# Patient Record
Sex: Male | Born: 1950 | Race: White | Hispanic: No | Marital: Married | Smoking: Current every day smoker
Health system: Southern US, Community
[De-identification: ages and names within clinical notes are randomized; demographics above are authoritative.]

## PROBLEM LIST (undated history)

## (undated) DIAGNOSIS — Z8619 Personal history of other infectious and parasitic diseases: Secondary | ICD-10-CM

## (undated) DIAGNOSIS — Z5189 Encounter for other specified aftercare: Secondary | ICD-10-CM

## (undated) DIAGNOSIS — F32A Depression, unspecified: Secondary | ICD-10-CM

## (undated) DIAGNOSIS — F419 Anxiety disorder, unspecified: Secondary | ICD-10-CM

## (undated) DIAGNOSIS — N189 Chronic kidney disease, unspecified: Secondary | ICD-10-CM

## (undated) HISTORY — DX: Encounter for other specified aftercare: Z51.89

## (undated) HISTORY — DX: Personal history of other infectious and parasitic diseases: Z86.19

## (undated) HISTORY — DX: Depression, unspecified: F32.A

## (undated) HISTORY — DX: Chronic kidney disease, unspecified: N18.9

## (undated) HISTORY — PX: NO PAST SURGERIES: SHX2092

## (undated) HISTORY — DX: Anxiety disorder, unspecified: F41.9

---

## 1997-11-30 ENCOUNTER — Emergency Department (HOSPITAL_COMMUNITY): Admission: EM | Admit: 1997-11-30 | Discharge: 1997-11-30 | Payer: Self-pay | Admitting: Emergency Medicine

## 1998-03-26 ENCOUNTER — Emergency Department (HOSPITAL_COMMUNITY): Admission: EM | Admit: 1998-03-26 | Discharge: 1998-03-26 | Payer: Self-pay | Admitting: Emergency Medicine

## 2010-04-14 DIAGNOSIS — Z8619 Personal history of other infectious and parasitic diseases: Secondary | ICD-10-CM

## 2010-04-14 HISTORY — DX: Personal history of other infectious and parasitic diseases: Z86.19

## 2011-07-30 ENCOUNTER — Encounter (HOSPITAL_BASED_OUTPATIENT_CLINIC_OR_DEPARTMENT_OTHER): Payer: Self-pay | Admitting: *Deleted

## 2011-07-30 ENCOUNTER — Emergency Department (INDEPENDENT_AMBULATORY_CARE_PROVIDER_SITE_OTHER): Payer: BC Managed Care – PPO

## 2011-07-30 ENCOUNTER — Emergency Department (HOSPITAL_BASED_OUTPATIENT_CLINIC_OR_DEPARTMENT_OTHER)
Admission: EM | Admit: 2011-07-30 | Discharge: 2011-07-30 | Disposition: A | Discharge status: Home / Self Care | Payer: BC Managed Care – PPO | Attending: Emergency Medicine | Admitting: Emergency Medicine

## 2011-07-30 DIAGNOSIS — M79609 Pain in unspecified limb: Secondary | ICD-10-CM | POA: Insufficient documentation

## 2011-07-30 DIAGNOSIS — S92309A Fracture of unspecified metatarsal bone(s), unspecified foot, initial encounter for closed fracture: Secondary | ICD-10-CM

## 2011-07-30 DIAGNOSIS — IMO0002 Reserved for concepts with insufficient information to code with codable children: Secondary | ICD-10-CM | POA: Insufficient documentation

## 2011-07-30 DIAGNOSIS — M7989 Other specified soft tissue disorders: Secondary | ICD-10-CM | POA: Insufficient documentation

## 2011-07-30 DIAGNOSIS — F17200 Nicotine dependence, unspecified, uncomplicated: Secondary | ICD-10-CM | POA: Insufficient documentation

## 2011-07-30 NOTE — Discharge Instructions (Signed)
Foot Fracture Your caregiver has diagnosed you as having a foot fracture (broken bone). Your foot has many bones. You have a fracture, or break, in one of these bones. In some cases, your doctor may put on a splint or removable fracture boot until the swelling in your foot has lessened. A cast may or may not be required. HOME CARE INSTRUCTIONS  If you do not have a cast or splint:  You may bear weight on your injured foot as tolerated or advised.   Do not put any weight on your injured foot for as long as directed by your caregiver. Slowly increase the amount of time you walk on the foot as the pain and swelling allows or as advised.   Use crutches until you can bear weight without pain. A gradual increase in weight bearing may help.   Apply ice to the injury for 15 to 20 minutes each hour while awake for the first 2 days. Put the ice in a plastic bag and place a towel between the bag of ice and your skin.   If an ace bandage (stretchy, elastic wrapping bandage) was applied, you may re-wrap it if ankle is more painful or your toes become cold and swollen.  If you have a cast or splint:  Use your crutches for as long as directed by your caregiver.   To lessen the swelling, keep the injured foot elevated on pillows while lying down or sitting. Elevate your foot above your heart.   Apply ice to the injury for 15 to 20 minutes each hour while awake for the first 2 days. Put the ice in a plastic bag and place a thin towel between the bag of ice and your cast.   Plaster or fiberglass cast:   Do not try to scratch the skin under the cast using a sharp or pointed object down the cast.   Check the skin around the cast every day. You may put lotion on any red or sore areas.   Keep your cast clean and dry.   Plaster splint:   Wear the splint until you are seen for a follow-up examination.   You may loosen the elastic around the splint if your toes become numb, tingle, or turn blue or cold. Do  not rest it on anything harder than a pillow in the first 24 hours.   Do not put pressure on any part of your splint. Use your crutches as directed.   Keep your splint dry. It can be protected during bathing with a plastic bag. Do not lower the splint into water.   If you have a fracture boot you may remove it to shower. Bear weight only as instructed by your caregiver.   Only take over-the-counter or prescription medicines for pain, discomfort, or fever as directed by your caregiver.  SEEK IMMEDIATE MEDICAL CARE IF:   Your cast gets damaged or breaks.   You have continued severe pain or more swelling than you did before the cast was put on.   Your skin or nails of your casted foot turn blue, gray, feel cold or numb.   There is a bad smell from your cast.   There is severe pain with movement of your toes.   There are new stains and/or drainage coming from under the cast.  MAKE SURE YOU:   Understand these instructions.   Will watch your condition.   Will get help right away if you are not doing well or get   worse.  Document Released: 03/28/2000 Document Revised: 03/20/2011 Document Reviewed: 05/04/2008 Exit[employer] Patient Information 2012 [employer]. 

## 2011-07-30 NOTE — ED Notes (Signed)
Patient states that he was catching "fast balls" with his son and the ball came down and hit his foot a week ago. Patient has been applying ice and taking ibuprofen with little relief. Pt states that the ice gets the swelling down but when he walks on it, the swelling increase. patient states that it hurts when he walks on it. Elevates foot at night.

## 2011-07-30 NOTE — ED Provider Notes (Signed)
History     CSN: [id number]  Arrival date & time 07/30/11  1302   None     Chief Complaint  Patient presents with  . Foot Injury    (Consider location/radiation/quality/duration/timing/severity/associated sxs/prior treatment) Patient is a 61 y.o. male presenting with foot injury. The history is provided by the patient. No language interpreter was used.  Foot Injury  Incident onset: 1.5. The incident occurred at home. The injury mechanism was a direct blow. The pain is present in the right foot. The quality of the pain is described as aching. The pain is at a severity of 6/10. The pain is moderate. Pertinent negatives include no numbness and no inability to bear weight. He reports no foreign bodies present. The symptoms are aggravated by nothing. He has tried nothing for the symptoms. The treatment provided no relief.    History reviewed. No pertinent past medical history.  History reviewed. No pertinent past surgical history.  History reviewed. No pertinent family history.  History  Substance Use Topics  . Smoking status: Current Everyday Smoker -- 2.0 packs/day  . Smokeless tobacco: Not on file  . Alcohol Use: No      Review of Systems  Musculoskeletal: Positive for myalgias and joint swelling.  Neurological: Negative for numbness.  All other systems reviewed and are negative.    Allergies  Acyclovir and related  Home Medications  No current outpatient prescriptions on file.  BP 146/71  Pulse 100  Temp(Src) 98.1 F (36.7 C) (Oral)  Resp 16  Ht 6\' 1"  (1.854 m)  Wt 160 lb (72.576 kg)  BMI 21.11 kg/m2  SpO2 100%  Physical Exam  Constitutional: He is oriented to person, place, and time. He appears well-developed and well-nourished.  Musculoskeletal: He exhibits edema and tenderness.       Tender right foot,  Bruising to base of toes  Neurological: He is alert and oriented to person, place, and time.  Skin: Skin is warm and dry.  Psychiatric: He has a  normal mood and affect.    ED Course  Procedures (including critical care time)  Labs Reviewed - No data to display Dg Foot Complete Right  07/30/2011  *RADIOLOGY REPORT*  Clinical Data: Blow to the dorsal aspect of the foot 1 week ago. Pain and swelling.  RIGHT FOOT COMPLETE - 3+ VIEW  Comparison: None.  Findings: A nondisplaced fracture is identified on the lateral view through the base of a metatarsal.  The fracture is not well seen on the AP and oblique views but appears to involve the second metatarsal. The fracture does not appear to extend to the articular surface.  No other acute bony or joint abnormality is identified.  IMPRESSION: Nondisplaced fracture through the proximal metaphysis of a metatarsal, likely the second.  Original Report Authenticated By: Bernadene Bell. Maricela Curet, M.D.     No diagnosis found.    MDM  Pt placed in ace and post op shoe.  Pt advised to follow up with Dr. Pearletha Forge in 1 week.  Pt declined crutches or pain medication        Lonia Skinner [place], [place] 07/30/11 [phone]

## 2011-07-30 NOTE — ED Provider Notes (Signed)
Medical screening examination/treatment/procedure(s) were performed by non-physician practitioner and as supervising physician I was immediately available for consultation/collaboration.  Shelda Jakes, MD 07/30/11 [phone]

## 2011-07-30 NOTE — ED Notes (Signed)
Pt c/o right foot pain x 1 week ago hit by baseball

## 2011-08-15 ENCOUNTER — Encounter: Payer: Self-pay | Admitting: Family Medicine

## 2011-08-15 ENCOUNTER — Ambulatory Visit (HOSPITAL_BASED_OUTPATIENT_CLINIC_OR_DEPARTMENT_OTHER)
Admission: RE | Admit: 2011-08-15 | Discharge: 2011-08-15 | Disposition: A | Discharge status: Home / Self Care | Payer: BC Managed Care – PPO | Source: Ambulatory Visit | Attending: Family Medicine | Admitting: Family Medicine

## 2011-08-15 ENCOUNTER — Ambulatory Visit (INDEPENDENT_AMBULATORY_CARE_PROVIDER_SITE_OTHER): Payer: BC Managed Care – PPO | Admitting: Family Medicine

## 2011-08-15 VITALS — BP 151/79 | HR 98 | Temp 98.0°F | Ht 74.0 in | Wt 165.0 lb

## 2011-08-15 DIAGNOSIS — S8990XA Unspecified injury of unspecified lower leg, initial encounter: Secondary | ICD-10-CM

## 2011-08-15 DIAGNOSIS — S99922A Unspecified injury of left foot, initial encounter: Secondary | ICD-10-CM | POA: Insufficient documentation

## 2011-08-15 DIAGNOSIS — S99921A Unspecified injury of right foot, initial encounter: Secondary | ICD-10-CM

## 2011-08-15 DIAGNOSIS — M25511 Pain in right shoulder: Secondary | ICD-10-CM | POA: Insufficient documentation

## 2011-08-15 DIAGNOSIS — Z09 Encounter for follow-up examination after completed treatment for conditions other than malignant neoplasm: Secondary | ICD-10-CM

## 2011-08-15 DIAGNOSIS — S92309A Fracture of unspecified metatarsal bone(s), unspecified foot, initial encounter for closed fracture: Secondary | ICD-10-CM | POA: Insufficient documentation

## 2011-08-15 DIAGNOSIS — X58XXXA Exposure to other specified factors, initial encounter: Secondary | ICD-10-CM | POA: Insufficient documentation

## 2011-08-15 DIAGNOSIS — M25519 Pain in unspecified shoulder: Secondary | ICD-10-CM

## 2011-08-15 DIAGNOSIS — S99929A Unspecified injury of unspecified foot, initial encounter: Secondary | ICD-10-CM

## 2011-08-15 DIAGNOSIS — S99919A Unspecified injury of unspecified ankle, initial encounter: Secondary | ICD-10-CM | POA: Insufficient documentation

## 2011-08-15 NOTE — Assessment & Plan Note (Signed)
metatarsal base fracture.  Difficult to discern whether this is 2nd or 3rd MT by radiographs - by exam his most pain is 3rd MT.  Regardless this should heal well with conservative care.  Repeat x-rays today show callus formation.  Only 4 weeks out from injury - discussed should be another 2-4 weeks before he feels back to normal though swelling can take longer.  Can use postop shoe as needed (feels comfortable in this and a regular shoe).  Icing, tylenol, nsaids as needed.  F/u prn.

## 2011-08-15 NOTE — Assessment & Plan Note (Signed)
2/2 rotator cuff impingement, less so AC DJD.  Home exercise program demonstrated with theraband.

## 2011-08-15 NOTE — Progress Notes (Signed)
  Subjective:    Patient ID: Ian Hart, male    DOB: 04-14-1951, 61 y.o.   MRN: [id number]  PCP: None  HPI 61 yo M here for right foot injury.  Patient reports on 4/7 he was catching while his son was throwing pitches. One of these broke to the left and struck him directly on dorsal aspect of right foot. Limping after this. + swelling (and still does at times). He eventually went to ED and had x-rays showing a metatarsal fracture (either 2nd or 3rd MT - seen on lateral). Placed in postop shoe which he used for 1 week. Has been taking some ibuprofen and aspirin, using ace wrap also. No prior right foot injuries.  At end of visit also asked about some lateral upper arm/shoulder pain with pitching/overhead motions.  History reviewed. No pertinent past medical history.  No current outpatient prescriptions on file prior to visit.    History reviewed. No pertinent past surgical history.  Allergies  Allergen Reactions  . Acyclovir And Related     History   Social History  . Marital Status: Married    Spouse Name: N/A    Number of Children: N/A  . Years of Education: N/A   Occupational History  . Not on file.   Social History Main Topics  . Smoking status: Current Everyday Smoker -- 2.0 packs/day  . Smokeless tobacco: Not on file  . Alcohol Use: No  . Drug Use: No  . Sexually Active: No   Other Topics Concern  . Not on file   Social History Narrative  . No narrative on file    Family History  Problem Relation Age of Onset  . Sudden death Mother   . Hypertension Neg Hx   . Hyperlipidemia Neg Hx   . Heart attack Neg Hx   . Diabetes Neg Hx     BP 151/79  Pulse 98  Temp(Src) 98 F (36.7 C) (Oral)  Ht 6\' 2"  (1.88 m)  Wt 165 lb (74.844 kg)  BMI 21.18 kg/m2  Review of Systems See HPI above.    Objective:   Physical Exam Gen: NAD  L foot: Mild swelling dorsally.  No bruising or other deformity. FROM ankle without pain. TTP greatest at base of  3rd MT, less at 2nd MT. No other TTP about foot or ankle. Negative ant drawer and talar tilt.   Negative syndesmotic compression. Thompsons test negative. NV intact distally.  R shoulder: No swelling, ecchymoses.  No gross deformity. Minimal TTP AC joint - more TTP just under acromion laterally. FROM. Negative Hawkins, Neers. Strength 5/5 with mild + empty can, no pain with resisted internal/external rotation. Negative apprehension. NV intact distally.    Assessment & Plan:  1. Left foot injury - metatarsal base fracture.  Difficult to discern whether this is 2nd or 3rd MT by radiographs - by exam his most pain is 3rd MT.  Regardless this should heal well with conservative care.  Repeat x-rays today show callus formation.  Only 4 weeks out from injury - discussed should be another 2-4 weeks before he feels back to normal though swelling can take longer.  Can use postop shoe as needed (feels comfortable in this and a regular shoe).  Icing, tylenol, nsaids as needed.  F/u prn.  2. Right shoulder pain - 2/2 rotator cuff impingement, less so AC DJD.  Home exercise program demonstrated with theraband.

## 2011-11-10 ENCOUNTER — Ambulatory Visit (INDEPENDENT_AMBULATORY_CARE_PROVIDER_SITE_OTHER): Payer: BC Managed Care – PPO | Admitting: Family

## 2011-11-10 ENCOUNTER — Encounter: Payer: Self-pay | Admitting: Family

## 2011-11-10 VITALS — BP 136/80 | HR 84 | Temp 97.7°F | Resp 18 | Ht 72.0 in | Wt 150.0 lb

## 2011-11-10 DIAGNOSIS — L989 Disorder of the skin and subcutaneous tissue, unspecified: Secondary | ICD-10-CM

## 2011-11-10 DIAGNOSIS — F17200 Nicotine dependence, unspecified, uncomplicated: Secondary | ICD-10-CM

## 2011-11-10 DIAGNOSIS — Z720 Tobacco use: Secondary | ICD-10-CM | POA: Insufficient documentation

## 2011-11-10 DIAGNOSIS — Z0000 Encounter for general adult medical examination without abnormal findings: Secondary | ICD-10-CM | POA: Insufficient documentation

## 2011-11-10 DIAGNOSIS — Z23 Encounter for immunization: Secondary | ICD-10-CM

## 2011-11-10 HISTORY — DX: Encounter for general adult medical examination without abnormal findings: Z00.00

## 2011-11-10 MED ORDER — VARENICLINE TARTRATE 0.5 MG X 11 & 1 MG X 42 PO MISC: ORAL | Status: AC

## 2011-11-10 NOTE — Assessment & Plan Note (Signed)
Pt counseled on tobacco cessation for 5 minutes.  He wishes to try chantix.  Side effects discussed including rare risk of suicide ideation.

## 2011-11-10 NOTE — Progress Notes (Signed)
Subjective:    Patient ID: Ian Hart, male    DOB: [birth year], 61 y.o.   MRN: [id number]  HPI  [name] is a 61 yr old male who presents today to establish care. He has not had a primary care provider in some time.  Reports that he is due for a tetanus shot. Also wants zostavax.  Never had colo.  Also needs a cholesterol screening.    Tobacco abuse- 2PPD x 40 yrs.  Quit once for a year- cold [place].  Motivated to quit.   Review of Systems  Constitutional: Negative for unexpected weight change.  HENT: Negative for hearing loss.   Eyes: Negative for visual disturbance.  Respiratory: Negative for cough.   Cardiovascular: Negative for leg swelling.  Gastrointestinal: Negative for nausea, vomiting and diarrhea.  Genitourinary: Negative for frequency.       Nocturia x 2  Musculoskeletal: Negative for back pain and arthralgias.       Occasional charlie horse at night in calf  Skin: Negative for rash.  Neurological: Negative for headaches.  Hematological: Negative for adenopathy.  Psychiatric/Behavioral:       Denies depression/anxiety   Past Medical History  Diagnosis Date  . History of chicken pox   . History of shingles 04/2010    History   Social History  . Marital Status: Married    Spouse Name: N/A    Number of Children: 1  . Years of Education: N/A   Occupational History  . Not on file.   Social History Main Topics  . Smoking status: Current Everyday Smoker -- 2.0 packs/day  . Smokeless tobacco: Not on file  . Alcohol Use: 7.2 oz/week    12 Cans of beer per week  . Drug Use: No  . Sexually Active: No   Other Topics Concern  . Not on file   Social History Narrative   Regular exercise:  2-3 x weekly (sports, yardwork)Caffeine use:  3 cups coffee daily14 yr old sonWifeWorks at [employer]-  Payroll taxEnjoys golf, watching baseball, house work.      Past Surgical History  Procedure Date  . No past surgeries     Family History  Problem  Relation Age of Onset  . Sudden death Mother   . Rheumatic fever Mother   . Hypertension Neg Hx   . Hyperlipidemia Neg Hx   . Heart attack Neg Hx   . Diabetes Neg Hx     Allergies  Allergen Reactions  . Acyclovir And Related     No current outpatient prescriptions on file prior to visit.    BP 136/80  Pulse 84  Temp 97.7 F (36.5 C) (Oral)  Resp 18  Ht 6' (1.829 m)  Wt 150 lb 0.6 oz (68.058 kg)  BMI 20.35 kg/m2  SpO2 99%       Objective:   Physical Exam Physical Exam  Constitutional: He is oriented to person, place, and time. He appears well-developed and well-nourished. No distress.  HENT:  Head: Normocephalic and atraumatic.  Right Ear: Tympanic membrane and ear canal normal.  Left Ear: Tympanic membrane and ear canal normal.  Mouth/Throat: Oropharynx is clear and moist.  Eyes: Pupils are equal, round, and reactive to light. No scleral icterus.  Neck: Normal range of motion. No thyromegaly present.  Cardiovascular: Normal rate and regular rhythm.   No murmur heard. Pulmonary/Chest: Effort normal and breath sounds normal. No respiratory distress. He has no wheezes. He has no rales. He exhibits no  tenderness.  Abdominal: Soft. Bowel sounds are normal. He exhibits no distension and no mass. There is no tenderness. There is no rebound and no guarding.  Musculoskeletal: He exhibits no edema. poor posture is noted.  ?kyphosis Lymphadenopathy:    He has no cervical adenopathy.  Neurological: He is alert and oriented to person, place, and time. He has normal reflexes. He exhibits normal muscle tone. Coordination normal.  Skin: Skin is warm and dry.  Psychiatric: He has a normal mood and affect. His behavior is normal. Judgment and thought content normal.  GU: prostate is smooth, no enlargement or nodules are noted.  Heme neg stool.          Assessment & Plan:           Assessment & Plan:

## 2011-11-10 NOTE — Patient Instructions (Signed)
Please schedule your bone density at the front desk. Check with [employer] re: coverage for the Zostavax (shingles shot).  Schedule a nurse visit to have this vaccine at your earliest convenience. Please return fasting to the lab on Wednesday Morning. Good luck quitting smoking. Call us in 2-3 weeks for a continuation month rx for chantix. Welcome to [facility]!

## 2011-11-10 NOTE — Assessment & Plan Note (Signed)
Pt will return fasting for lab work.  Tdap given today.  Pt will check with insurance re: zostavax coverage and call to schedule nurse visit.  Schedule screening dexa and colonoscopy.  Obtain fasting lab work and PSA.

## 2011-11-11 ENCOUNTER — Encounter: Payer: Self-pay | Admitting: Internal Medicine

## 2011-11-12 ENCOUNTER — Telehealth: Payer: Self-pay | Admitting: *Deleted

## 2011-11-12 ENCOUNTER — Other Ambulatory Visit: Payer: Self-pay | Admitting: Family

## 2011-11-12 DIAGNOSIS — Z0000 Encounter for general adult medical examination without abnormal findings: Secondary | ICD-10-CM

## 2011-11-12 LAB — HEPATIC FUNCTION PANEL
AST: 18 U/L (ref 0–37)
Albumin: 4.6 g/dL (ref 3.5–5.2)
Alkaline Phosphatase: 57 U/L (ref 39–117)
Indirect Bilirubin: 0.4 mg/dL (ref 0.0–0.9)
Total Bilirubin: 0.6 mg/dL (ref 0.3–1.2)
Total Protein: 6.8 g/dL (ref 6.0–8.3)

## 2011-11-12 LAB — CBC WITH DIFFERENTIAL/PLATELET
Basophils Absolute: 0.1 10*3/uL (ref 0.0–0.1)
Eosinophils Relative: 2 % (ref 0–5)
Lymphocytes Relative: 18 % (ref 12–46)
Lymphs Abs: 1.4 10*3/uL (ref 0.7–4.0)
MCV: 90.4 fL (ref 78.0–100.0)
Neutro Abs: 5.6 10*3/uL (ref 1.7–7.7)
Platelets: 267 10*3/uL (ref 150–400)
RBC: 5.19 MIL/uL (ref 4.22–5.81)
RDW: 12.9 % (ref 11.5–15.5)
WBC: 7.7 10*3/uL (ref 4.0–10.5)

## 2011-11-12 LAB — BASIC METABOLIC PANEL
CO2: 27 mEq/L (ref 19–32)
Glucose, Bld: 104 mg/dL — ABNORMAL HIGH (ref 70–99)
Potassium: 4.9 mEq/L (ref 3.5–5.3)
Sodium: 132 mEq/L — ABNORMAL LOW (ref 135–145)

## 2011-11-12 LAB — TSH: TSH: 1.418 u[IU]/mL (ref 0.350–4.500)

## 2011-11-12 LAB — LIPID PANEL
LDL Cholesterol: 61 mg/dL (ref 0–99)
Total CHOL/HDL Ratio: 1.9 Ratio

## 2011-11-12 NOTE — Telephone Encounter (Signed)
Message copied by Kathi Simpers on Wed Nov 12, 2011  9:14 AM ------      Message from: O'SULLIVAN, MELISSA      Created: Mon Nov 10, 2011  3:17 PM       Pt will return to lab fasting on Wednesday for:       PSA      BMET      LFT      CBC      FLP      TSH      UA with reflex micro            ICD9 V70.

## 2011-11-12 NOTE — Telephone Encounter (Signed)
Pt presented to the lab. Orders placed and given to the lab.

## 2011-11-13 ENCOUNTER — Ambulatory Visit (INDEPENDENT_AMBULATORY_CARE_PROVIDER_SITE_OTHER)
Admission: RE | Admit: 2011-11-13 | Discharge: 2011-11-13 | Disposition: A | Discharge status: Home / Self Care | Payer: BC Managed Care – PPO | Source: Ambulatory Visit

## 2011-11-13 DIAGNOSIS — Z0000 Encounter for general adult medical examination without abnormal findings: Secondary | ICD-10-CM

## 2011-11-13 LAB — URINALYSIS, ROUTINE W REFLEX MICROSCOPIC
Bilirubin Urine: NEGATIVE
Leukocytes, UA: NEGATIVE
Nitrite: NEGATIVE
Protein, ur: NEGATIVE mg/dL
Specific Gravity, Urine: 1.009 (ref 1.005–1.030)
Urobilinogen, UA: 0.2 mg/dL (ref 0.0–1.0)

## 2011-11-13 LAB — PSA: PSA: 1.58 ng/mL (ref <=4.00)

## 2011-11-18 ENCOUNTER — Encounter: Payer: Self-pay | Admitting: Family

## 2011-11-18 LAB — HEMOGLOBIN A1C: Hgb A1c MFr Bld: 5.4 % (ref <5.7)

## 2011-11-24 NOTE — Progress Notes (Signed)
  Subjective:    Patient ID: Ian Hart, male    DOB: Aug 22, 1950, 61 y.o.   MRN: [id number]  HPI    Review of Systems     Objective:   Physical Exam   Skin:  Hyperpigmented lesion right cheek.     Assessment & Plan:  Skin lesions- recommended referral to dermatology.

## 2011-11-24 NOTE — Addendum Note (Signed)
Addended by: [name] on: 11/24/2011 02:49 PM   Modules accepted: Orders

## 2011-11-27 ENCOUNTER — Telehealth: Payer: Self-pay | Admitting: Family

## 2011-11-27 NOTE — Telephone Encounter (Signed)
Message copied by [name] on Thu Nov 27, 2011 10:15 PM ------      Message from: Darral Dash E      Created: Thu Nov 27, 2011  9:59 AM                   Patient to make his own appt.            ----- Message -----         From: [name], NP         Sent: 11/24/2011   4:06 PM           To: Eulah Pont            I put that in this afternoon- sorry about delay.       ----- Message -----         From: Eulah Pont         Sent: 11/24/2011   3:35 PM           To: [name], NP            Patient called ,about a Dermatology appt.

## 2011-12-10 ENCOUNTER — Telehealth: Payer: Self-pay | Admitting: *Deleted

## 2011-12-10 DIAGNOSIS — M8580 Other specified disorders of bone density and structure, unspecified site: Secondary | ICD-10-CM

## 2011-12-10 NOTE — Telephone Encounter (Signed)
Received message from pt requesting DEXA results. Please advise.

## 2011-12-11 ENCOUNTER — Ambulatory Visit: Payer: BC Managed Care – PPO

## 2011-12-11 ENCOUNTER — Ambulatory Visit (INDEPENDENT_AMBULATORY_CARE_PROVIDER_SITE_OTHER): Payer: BC Managed Care – PPO | Admitting: Family

## 2011-12-11 DIAGNOSIS — Z2911 Encounter for prophylactic immunotherapy for respiratory syncytial virus (RSV): Secondary | ICD-10-CM

## 2011-12-11 DIAGNOSIS — Z23 Encounter for immunization: Secondary | ICD-10-CM

## 2011-12-12 DIAGNOSIS — M810 Age-related osteoporosis without current pathological fracture: Secondary | ICD-10-CM | POA: Insufficient documentation

## 2011-12-12 NOTE — Telephone Encounter (Signed)
Left message requesting call back.  When pt calls back please let him know that bone density shows osteopenia- (almost osteoporosis).  I would recommend that he start caltrate bid, work on quitting smoking, make sure he is getting regular exercise.  Also, I would like to check a vitamin D level (osteopenia).  We will plan to repeat bone density in 2 years.  If it worsens any at that time, then we may need to consider adding medication such as fosamax.

## 2011-12-12 NOTE — Telephone Encounter (Signed)
Pt notified. Future lab order entered and given to the lab.

## 2011-12-26 ENCOUNTER — Ambulatory Visit (AMBULATORY_SURGERY_CENTER): Payer: BC Managed Care – PPO | Admitting: *Deleted

## 2011-12-26 VITALS — Ht 73.0 in | Wt 152.4 lb

## 2011-12-26 DIAGNOSIS — Z1211 Encounter for screening for malignant neoplasm of colon: Secondary | ICD-10-CM

## 2011-12-26 MED ORDER — MOVIPREP 100 G PO SOLR: ORAL | Status: DC

## 2011-12-29 ENCOUNTER — Encounter: Payer: Self-pay | Admitting: Internal Medicine

## 2012-01-09 ENCOUNTER — Ambulatory Visit (AMBULATORY_SURGERY_CENTER): Payer: BC Managed Care – PPO | Admitting: Internal Medicine

## 2012-01-09 ENCOUNTER — Encounter: Payer: Self-pay | Admitting: Internal Medicine

## 2012-01-09 VITALS — BP 137/75 | HR 80 | Temp 98.0°F | Resp 22 | Ht 73.0 in | Wt 152.0 lb

## 2012-01-09 DIAGNOSIS — Z1211 Encounter for screening for malignant neoplasm of colon: Secondary | ICD-10-CM

## 2012-01-09 MED ORDER — SODIUM CHLORIDE 0.9 % IV SOLN: 500.0000 mL | INTRAVENOUS | Status: DC

## 2012-01-09 NOTE — Progress Notes (Signed)
Propofol per Knute Neu CRNA, all meds titrated per CRNA during procedure. See scanned intra procedure report. ewm

## 2012-01-09 NOTE — Progress Notes (Signed)
Patient did not experience any of the following events: a burn prior to discharge; a fall within the facility; wrong site/side/patient/procedure/implant event; or a hospital transfer or hospital admission upon discharge from the facility. (G8907) Patient did not have preoperative order for IV antibiotic SSI prophylaxis. (G8918)  

## 2012-01-09 NOTE — Op Note (Signed)
[facility] [address] [place], [address]   COLONOSCOPY PROCEDURE REPORT  PATIENT: Ian Hart, Ian Hart.  MR#: [id number] BIRTHDATE: Mar 18, 1951 , 61  yrs. old GENDER: Male ENDOSCOPIST: Hart Carwin, MD REFERRED AO:[id number] Peggyann Juba, FNP PROCEDURE DATE:  01/09/2012 PROCEDURE:   Colonoscopy, screening ASA CLASS:   Class I INDICATIONS:average risk patient for colon cancer. MEDICATIONS: MAC sedation, administered by CRNA and Propofol (Diprivan) 320 mg IV  DESCRIPTION OF PROCEDURE:   After the risks benefits and alternatives of the procedure were thoroughly explained, informed consent was obtained.  A digital rectal exam revealed no abnormalities of the rectum.   The LB [id number]  endoscope was introduced through the anus and advanced to the cecum, which was identified by both the appendix and ileocecal valve. No adverse events experienced.   The quality of the prep was excellent, using MoviPrep  The instrument was then slowly withdrawn as the colon was fully examined.      COLON FINDINGS: Mild diverticulosis was noted.  Retroflexed views revealed no abnormalities. The time to cecum=13 minutes 19 seconds. Withdrawal time=6 minutes 40 seconds.  The scope was withdrawn and the procedure completed. COMPLICATIONS: There were no complications.  ENDOSCOPIC IMPRESSION: Mild diverticulosis of the sigmoid colon  RECOMMENDATIONS: High fiber diet  Recall colonoscopy in 10 years eSigned:  Hart Carwin, MD 01/09/2012 11:16 AM   cc:

## 2012-01-09 NOTE — Patient Instructions (Signed)
YOU HAD AN ENDOSCOPIC PROCEDURE TODAY AT THE [facility]: Refer to the procedure report that was given to you for any specific questions about what was found during the examination.  If the procedure report does not answer your questions, please call your gastroenterologist to clarify.  If you requested that your care partner not be given the details of your procedure findings, then the procedure report has been included in a sealed envelope for you to review at your convenience later.  YOU SHOULD EXPECT: Some feelings of bloating in the abdomen. Passage of more gas than usual.  Walking can help get rid of the air that was put into your GI tract during the procedure and reduce the bloating. If you had a lower endoscopy (such as a colonoscopy or flexible sigmoidoscopy) you may notice spotting of blood in your stool or on the toilet paper. If you underwent a bowel prep for your procedure, then you may not have a normal bowel movement for a few days.  DIET: Your first meal following the procedure should be a light meal and then it is ok to progress to your normal diet.  A half-sandwich or bowl of soup is an example of a good first meal.  Heavy or fried foods are harder to digest and may make you feel nauseous or bloated.  Likewise meals heavy in dairy and vegetables can cause extra gas to form and this can also increase the bloating.  Drink plenty of fluids but you should avoid alcoholic beverages for 24 hours.  ACTIVITY: Your care partner should take you home directly after the procedure.  You should plan to take it easy, moving slowly for the rest of the day.  You can resume normal activity the day after the procedure however you should NOT DRIVE or use heavy machinery for 24 hours (because of the sedation medicines used during the test).    SYMPTOMS TO REPORT IMMEDIATELY: A gastroenterologist can be reached at any hour.  During normal business hours, 8:30 AM to 5:00 PM Monday through Friday,  call [phone].  After hours and on weekends, please call the GI answering service at [phone] who will take a message and have the physician on call contact you.   Following lower endoscopy (colonoscopy or flexible sigmoidoscopy):  Excessive amounts of blood in the stool  Significant tenderness or worsening of abdominal pains  Swelling of the abdomen that is new, acute  Fever of 100F or higher    FOLLOW UP: If any biopsies were taken you will be contacted by phone or by letter within the next 1-3 weeks.  Call your gastroenterologist if you have not heard about the biopsies in 3 weeks.  Our staff will call the home number listed on your records the next business day following your procedure to check on you and address any questions or concerns that you may have at that time regarding the information given to you following your procedure. This is a courtesy call and so if there is no answer at the home number and we have not heard from you through the emergency physician on call, we will assume that you have returned to your regular daily activities without incident.  SIGNATURES/CONFIDENTIALITY: You and/or your care partner have signed paperwork which will be entered into your electronic medical record.  These signatures attest to the fact that that the information above on your After Visit Summary has been reviewed and is understood.  Full responsibility of the confidentiality   of this discharge information lies with you and/or your care-partner.     

## 2012-01-12 ENCOUNTER — Telehealth: Payer: Self-pay | Admitting: *Deleted

## 2012-01-12 NOTE — Telephone Encounter (Signed)
  Follow up Call-  Call back number 01/09/2012  Post procedure Call Back phone  # [phone]  Permission to leave phone message Yes     Patient questions:  Do you have a fever, pain , or abdominal swelling? no Pain Score  0 *  Have you tolerated food without any problems? yes  Have you been able to return to your normal activities? yes  Do you have any questions about your discharge instructions: Diet   no Medications  no Follow up visit  no  Do you have questions or concerns about your Care? no  Actions: * If pain score is 4 or above: No action needed, pain <4.

## 2013-02-04 ENCOUNTER — Encounter: Payer: Self-pay | Admitting: Family

## 2013-02-04 ENCOUNTER — Ambulatory Visit (INDEPENDENT_AMBULATORY_CARE_PROVIDER_SITE_OTHER): Payer: BC Managed Care – PPO | Admitting: Family

## 2013-02-04 VITALS — BP 120/80 | HR 93 | Temp 97.5°F | Resp 16 | Ht 72.0 in | Wt 159.1 lb

## 2013-02-04 DIAGNOSIS — Z23 Encounter for immunization: Secondary | ICD-10-CM

## 2013-02-04 DIAGNOSIS — R0989 Other specified symptoms and signs involving the circulatory and respiratory systems: Secondary | ICD-10-CM

## 2013-02-04 DIAGNOSIS — M25551 Pain in right hip: Secondary | ICD-10-CM

## 2013-02-04 DIAGNOSIS — M25559 Pain in unspecified hip: Secondary | ICD-10-CM

## 2013-02-04 MED ORDER — MELOXICAM 7.5 MG PO TABS: 7.5000 mg | ORAL_TABLET | Freq: Every day | ORAL | Status: DC

## 2013-02-04 NOTE — Progress Notes (Signed)
Subjective:    Patient ID: Ian Hart, male    DOB: 1950/12/02, 62 y.o.   MRN: [id number]  HPI  Mr. Ian Hart is a 62 yr old male who presents today with chief complaint of right hip pain.  Started 3 days ago. Has tried aleve with some improvement.  + heating pad.  Some aching in the right calf.  Denies recent long travel.  He denies sob or chest pain. Denies previous hx of hip pain or hip injury.     Review of Systems See HPI  Past Medical History  Diagnosis Date  . History of chicken pox   . History of shingles 04/2010    History   Social History  . Marital Status: Married    Spouse Name: N/A    Number of Children: 1  . Years of Education: N/A   Occupational History  . Not on file.   Social History Main Topics  . Smoking status: Current Every Day Smoker -- 2.00 packs/day    Types: Cigarettes  . Smokeless tobacco: Never Used  . Alcohol Use: 8.4 oz/week    14 Cans of beer per week  . Drug Use: No  . Sexual Activity: No   Other Topics Concern  . 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               Past Surgical History  Procedure Laterality Date  . No past surgeries      Family History  Problem Relation Age of Onset  . Sudden death Mother   . Rheumatic fever Mother   . Hypertension Neg Hx   . Hyperlipidemia Neg Hx   . Heart attack Neg Hx   . Diabetes Neg Hx   . Colon cancer Neg Hx   . Stomach cancer Neg Hx     Allergies  Allergen Reactions  . Acyclovir And Related Rash    Rash that looked like chicken pox    Current Outpatient Prescriptions on File Prior to Visit  Medication Sig Dispense Refill  . Calcium Carbonate-Vitamin D (CALTRATE 600+D) 600-400 MG-UNIT per tablet Take 1 tablet by mouth 2 (two) times daily.      . Cyanocobalamin (VITAMIN  B 12 PO) Take by mouth daily.      . Multiple Vitamin (MULTIVITAMIN) tablet Take 1 tablet by mouth daily.       No current facility-administered medications on file prior to visit.    BP 120/80  Pulse 93  Temp(Src) 97.5 F (36.4 C) (Oral)  Resp 16  Ht 6' (1.829 m)  Wt 159 lb 1.3 oz (72.158 kg)  BMI 21.57 kg/m2  SpO2 99%       Objective:   Physical Exam  Constitutional: He is oriented to person, place, and time. He appears well-developed and well-nourished. No distress.  HENT:  Head: Normocephalic and atraumatic.  Cardiovascular: Normal rate and regular rhythm.   No murmur heard. Pulses:      Dorsalis pedis pulses are 1+ on the right side, and 2+ on the left side.       Posterior tibial pulses are 1+ on the right side, and 2+ on the left side.  Pulmonary/Chest: Effort normal and breath sounds normal. No respiratory  distress. He has no wheezes. He has no rales. He exhibits no tenderness.  Musculoskeletal: He exhibits no edema.  Decreased abduction right hip. No tenderness to palpation overlying right hip.  No swelling of right calf. Neg homans Neg calf tenderness to palpation.   Lymphadenopathy:    He has no cervical adenopathy.  Neurological: He is alert and oriented to person, place, and time.  Skin: Skin is warm and dry.  Psychiatric: He has a normal mood and affect. His behavior is normal. Judgment and thought content normal.          Assessment & Plan:

## 2013-02-04 NOTE — Assessment & Plan Note (Signed)
Trial of meloxicam. If no improvement in 1-2 weeks, plan referral to ortho.

## 2013-02-04 NOTE — Assessment & Plan Note (Signed)
?   PVD.  Risk factor of smoking.  Recommend ABI.  Pt is agreeable. States he takes asa 325.  Advised ok to drop back to 81mg  day.

## 2013-02-04 NOTE — Patient Instructions (Signed)
Please start meloxicam for pain.   You will be contacted about your test to check the circulation of your legs. Call if pain worsens or if not improved in 1-2 weeks.

## 2013-02-08 NOTE — Addendum Note (Signed)
Addended by: [name] on: 02/08/2013 03:59 PM   Modules accepted: Orders

## 2013-02-14 ENCOUNTER — Ambulatory Visit (HOSPITAL_COMMUNITY): Payer: BC Managed Care – PPO | Attending: Cardiology

## 2013-02-14 DIAGNOSIS — R0989 Other specified symptoms and signs involving the circulatory and respiratory systems: Secondary | ICD-10-CM

## 2013-02-14 DIAGNOSIS — R252 Cramp and spasm: Secondary | ICD-10-CM | POA: Insufficient documentation

## 2013-02-14 DIAGNOSIS — M25559 Pain in unspecified hip: Secondary | ICD-10-CM | POA: Insufficient documentation

## 2013-02-14 DIAGNOSIS — R209 Unspecified disturbances of skin sensation: Secondary | ICD-10-CM | POA: Insufficient documentation

## 2013-02-14 DIAGNOSIS — I739 Peripheral vascular disease, unspecified: Secondary | ICD-10-CM

## 2014-01-02 ENCOUNTER — Encounter: Payer: Self-pay | Admitting: Family

## 2014-01-02 ENCOUNTER — Telehealth: Payer: Self-pay | Admitting: Family

## 2014-01-02 ENCOUNTER — Ambulatory Visit (INDEPENDENT_AMBULATORY_CARE_PROVIDER_SITE_OTHER): Payer: BC Managed Care – PPO | Admitting: Family

## 2014-01-02 VITALS — BP 146/78 | HR 79 | Temp 98.2°F | Resp 16 | Ht 72.0 in | Wt 154.1 lb

## 2014-01-02 DIAGNOSIS — Z23 Encounter for immunization: Secondary | ICD-10-CM

## 2014-01-02 DIAGNOSIS — Z720 Tobacco use: Secondary | ICD-10-CM

## 2014-01-02 DIAGNOSIS — R030 Elevated blood-pressure reading, without diagnosis of hypertension: Secondary | ICD-10-CM

## 2014-01-02 DIAGNOSIS — G5622 Lesion of ulnar nerve, left upper limb: Secondary | ICD-10-CM

## 2014-01-02 DIAGNOSIS — F17200 Nicotine dependence, unspecified, uncomplicated: Secondary | ICD-10-CM

## 2014-01-02 DIAGNOSIS — IMO0001 Reserved for inherently not codable concepts without codable children: Secondary | ICD-10-CM

## 2014-01-02 DIAGNOSIS — G5620 Lesion of ulnar nerve, unspecified upper limb: Secondary | ICD-10-CM

## 2014-01-02 MED ORDER — MELOXICAM 7.5 MG PO TABS: 7.5000 mg | ORAL_TABLET | Freq: Every day | ORAL | Status: DC

## 2014-01-02 MED ORDER — VARENICLINE TARTRATE 0.5 MG X 11 & 1 MG X 42 PO MISC: ORAL | Status: DC

## 2014-01-02 NOTE — Telephone Encounter (Signed)
Appt with Debbrah Alar today at 3 pm noted.

## 2014-01-02 NOTE — Telephone Encounter (Signed)
Patient Information:  Caller Name: Dedric  Phone: [phone]  Patient: Chin, Wachter  Gender: Male  DOB: [birth year]  Age: 63 Years  PCP: Debbrah Alar (Adults only)  Office Follow Up:  Does the office need to follow up with this patient?: No  Instructions For The Office: N/A   Symptoms  Reason For Call & Symptoms: Pt refused triage and requesting appt today.  Reviewed Health History In EMR: Yes  Reviewed Medications In EMR: Yes  Reviewed Allergies In EMR: Yes  Reviewed Surgeries / Procedures: Yes  Date of Onset of Symptoms: 01/02/2014  Guideline(s) Used:  No Protocol Available - Sick Adult  Disposition Per Guideline:   See Today in Office  Reason For Disposition Reached:   Patient wants to be seen  Advice Given:  N/A  Patient Refused Recommendation:  Patient Refused Care Advice  Pt requesting an appt refused triage.  Appt scheduled for 1500 with Camille Bal

## 2014-01-02 NOTE — Patient Instructions (Signed)
Start chantix. Work hard on quitting smoking. Avoid leaning on elbows/forearm. Start meloxicam once daily for next 1-2 weeks until left fingers feel better.  Work on low sodium diet.  Schedule a complete physical in 1 month.

## 2014-01-02 NOTE — Progress Notes (Signed)
Subjective:    Patient ID: Ian Hart, male    DOB: 12-May-1950, 63 y.o.   MRN: [id number]  HPI  Ian Hart is a 63 yr old male who presents today with chief complaint of tingling in the left 4th and 5th fingers. Started 2-3 weeks ago. No injury. Reports that he often leans on his left elbow and forearm at work while on computer and while driving.   BP Readings from Last 3 Encounters:  01/02/14 146/78  02/04/13 120/80  01/09/12 137/75   Tobacco abuse- would like to quit.    Elevated blood pressure-  BP Readings from Last 3 Encounters:  01/02/14 146/78  02/04/13 120/80  01/09/12 137/75       Review of Systems See HPI  Past Medical History  Diagnosis Date  . History of chicken pox   . History of shingles 04/2010    History   Social History  . Marital Status: Married    Spouse Name: N/A    Number of Children: 1  . Years of Education: N/A   Occupational History  . Not on file.   Social History Main Topics  . Smoking status: Current Every Day Smoker -- 2.00 packs/day    Types: Cigarettes  . Smokeless tobacco: Never Used  . Alcohol Use: 8.4 oz/week    14 Cans of beer per week  . Drug Use: No  . Sexual Activity: No   Other Topics Concern  . 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               Past Surgical History  Procedure Laterality Date  . No past surgeries      Family History  Problem Relation Age of Onset  . Sudden death Mother   . Rheumatic fever Mother   . Hypertension Neg Hx   . Hyperlipidemia Neg Hx   . Heart attack Neg Hx   . Diabetes Neg Hx   . Colon cancer Neg Hx   . Stomach cancer Neg Hx     Allergies  Allergen Reactions  . Acyclovir And Related Rash    Rash that looked like chicken pox    Current Outpatient  Prescriptions on File Prior to Visit  Medication Sig Dispense Refill  . aspirin EC 81 MG tablet Take 81 mg by mouth daily.      . Calcium Carbonate-Vitamin D (CALTRATE 600+D) 600-400 MG-UNIT per tablet Take 1 tablet by mouth 2 (two) times daily.      . Cyanocobalamin (VITAMIN B 12 PO) Take by mouth daily.      . Multiple Vitamin (MULTIVITAMIN) tablet Take 1 tablet by mouth daily.       No current facility-administered medications on file prior to visit.    BP 146/78  Pulse 79  Temp(Src) 98.2 F (36.8 C) (Oral)  Resp 16  Ht 6' (1.829 m)  Wt 154 lb 2 oz (69.911 kg)  BMI 20.90 kg/m2  SpO2 99%       Objective:   Physical Exam  Constitutional: He is oriented to person, place, and time. He appears well-developed and well-nourished. No distress.  Cardiovascular: Normal rate and regular rhythm.   No murmur heard. Pulmonary/Chest: Effort normal and breath  sounds normal. No respiratory distress. He has no wheezes. He has no rales. He exhibits no tenderness.  Musculoskeletal: He exhibits no edema.  Decreased sensation to monofilament left 5th finger and left lateral portion of 4th finger  Neurological: He is alert and oriented to person, place, and time.  Psychiatric: He has a normal mood and affect. His behavior is normal. Judgment and thought content normal.          Assessment & Plan:

## 2014-01-02 NOTE — Progress Notes (Signed)
Pre visit review using our clinic review tool, if applicable. No additional management support is needed unless otherwise documented below in the visit note/SLS  

## 2014-01-07 DIAGNOSIS — IMO0001 Reserved for inherently not codable concepts without codable children: Secondary | ICD-10-CM | POA: Insufficient documentation

## 2014-01-07 DIAGNOSIS — R030 Elevated blood-pressure reading, without diagnosis of hypertension: Secondary | ICD-10-CM

## 2014-01-07 DIAGNOSIS — G5620 Lesion of ulnar nerve, unspecified upper limb: Secondary | ICD-10-CM | POA: Insufficient documentation

## 2014-01-07 NOTE — Assessment & Plan Note (Signed)
Discussed low sodium diet.  Follow up in 1 month for BP recheck.

## 2014-01-07 NOTE — Assessment & Plan Note (Signed)
Symptoms consistent with ulnar nerve compression. Trial of short course of NSAIDS. Discussed avoiding pressure on left forearm and elbow.  Pt verbalizes understanding.

## 2014-01-07 NOTE — Assessment & Plan Note (Signed)
Wants to quit, would like to try chantix. Discussed side effects and risks (rare SI). Pt advised to go to the ED if SI, call us if he develops depressive symptoms.  3-5 minutes spent counseling pt on tobacco cessation.

## 2014-02-03 ENCOUNTER — Ambulatory Visit (INDEPENDENT_AMBULATORY_CARE_PROVIDER_SITE_OTHER): Payer: BC Managed Care – PPO | Admitting: Family

## 2014-02-03 ENCOUNTER — Encounter: Payer: Self-pay | Admitting: Family

## 2014-02-03 VITALS — BP 140/80 | HR 80 | Temp 97.6°F | Resp 16 | Ht 72.0 in | Wt 155.0 lb

## 2014-02-03 DIAGNOSIS — Z0000 Encounter for general adult medical examination without abnormal findings: Secondary | ICD-10-CM

## 2014-02-03 DIAGNOSIS — G5622 Lesion of ulnar nerve, left upper limb: Secondary | ICD-10-CM

## 2014-02-03 LAB — HEPATIC FUNCTION PANEL
ALBUMIN: 3.8 g/dL (ref 3.5–5.2)
ALT: 20 U/L (ref 0–53)
AST: 26 U/L (ref 0–37)
Alkaline Phosphatase: 49 U/L (ref 39–117)
Bilirubin, Direct: 0.1 mg/dL (ref 0.0–0.3)
Total Bilirubin: 0.8 mg/dL (ref 0.2–1.2)
Total Protein: 7.2 g/dL (ref 6.0–8.3)

## 2014-02-03 LAB — BASIC METABOLIC PANEL
BUN: 12 mg/dL (ref 6–23)
CHLORIDE: 98 meq/L (ref 96–112)
CO2: 26 mEq/L (ref 19–32)
Calcium: 9 mg/dL (ref 8.4–10.5)
Creatinine, Ser: 0.9 mg/dL (ref 0.4–1.5)
GFR: 95.26 mL/min (ref >60.00)
Glucose, Bld: 99 mg/dL (ref 70–99)
POTASSIUM: 4.7 meq/L (ref 3.5–5.1)
Sodium: 132 mEq/L — ABNORMAL LOW (ref 135–145)

## 2014-02-03 LAB — URINALYSIS, ROUTINE W REFLEX MICROSCOPIC
Bilirubin Urine: NEGATIVE
Hgb urine dipstick: NEGATIVE
Ketones, ur: NEGATIVE
Leukocytes, UA: NEGATIVE
NITRITE: NEGATIVE
Specific Gravity, Urine: 1.01 (ref 1.000–1.030)
TOTAL PROTEIN, URINE-UPE24: NEGATIVE
Urine Glucose: NEGATIVE
Urobilinogen, UA: 0.2 (ref 0.0–1.0)
pH: 7 (ref 5.0–8.0)

## 2014-02-03 LAB — CBC WITH DIFFERENTIAL/PLATELET
BASOS PCT: 0.8 % (ref 0.0–3.0)
Basophils Absolute: 0 10*3/uL (ref 0.0–0.1)
EOS PCT: 4 % (ref 0.0–5.0)
Eosinophils Absolute: 0.2 10*3/uL (ref 0.0–0.7)
HEMATOCRIT: 46 % (ref 39.0–52.0)
Hemoglobin: 15.2 g/dL (ref 13.0–17.0)
LYMPHS ABS: 1 10*3/uL (ref 0.7–4.0)
Lymphocytes Relative: 17.9 % (ref 12.0–46.0)
MCHC: 33 g/dL (ref 30.0–36.0)
MCV: 93.5 fl (ref 78.0–100.0)
Monocytes Absolute: 0.7 10*3/uL (ref 0.1–1.0)
Monocytes Relative: 12 % (ref 3.0–12.0)
NEUTROS PCT: 65.3 % (ref 43.0–77.0)
Neutro Abs: 3.6 10*3/uL (ref 1.4–7.7)
Platelets: 252 10*3/uL (ref 150.0–400.0)
RBC: 4.92 Mil/uL (ref 4.22–5.81)
RDW: 13.4 % (ref 11.5–15.5)
WBC: 5.6 10*3/uL (ref 4.0–10.5)

## 2014-02-03 LAB — LIPID PANEL
CHOLESTEROL: 162 mg/dL (ref 0–200)
HDL: 92.1 mg/dL (ref >39.00)
LDL CALC: 65 mg/dL (ref 0–99)
NonHDL: 69.9
TRIGLYCERIDES: 25 mg/dL (ref 0.0–149.0)
Total CHOL/HDL Ratio: 2
VLDL: 5 mg/dL (ref 0.0–40.0)

## 2014-02-03 LAB — TSH: TSH: 1.29 u[IU]/mL (ref 0.35–4.50)

## 2014-02-03 LAB — PSA: PSA: 2.31 ng/mL (ref 0.10–4.00)

## 2014-02-03 MED ORDER — MELOXICAM 7.5 MG PO TABS: 7.5000 mg | ORAL_TABLET | Freq: Every day | ORAL | Status: DC

## 2014-02-03 NOTE — Patient Instructions (Signed)
Please complete lab work prior to leaving. Follow up in 4 months.

## 2014-02-03 NOTE — Assessment & Plan Note (Addendum)
Obtain fasting labs, PSA (discussed pro's/con's) Continue healthy diet, exercise Low sodium diet.  Urged smoking cessation.

## 2014-02-03 NOTE — Progress Notes (Signed)
Pre visit review using our clinic review tool, if applicable. No additional management support is needed unless otherwise documented below in the visit note. 

## 2014-02-03 NOTE — Progress Notes (Signed)
Subjective:    Patient ID: Ian Hart, male    DOB: [birth year], 63 y.o.   MRN: [id number]  HPI  Ian Hart is a 63 yr old male who presents today for complete physical. Colo- due in 2023 Immunizations- up to date.  Exercise- walks 1/2 mile to and from car at work.   Diet- reports healthy diet.   Tobacco abuse- plans to start chantix after Halloween.  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   BP Readings from Last 3 Encounters:  02/03/14 140/80  01/02/14 146/78  02/04/13 120/80    Review of Systems  Constitutional: Negative for unexpected weight change.  HENT: Negative for hearing loss and rhinorrhea.   Eyes: Negative for visual disturbance.  Respiratory: Negative for cough and shortness of breath.   Cardiovascular: Negative for chest pain and leg swelling.  Gastrointestinal: Negative for nausea, vomiting and diarrhea.  Genitourinary: Negative for frequency.       Reports nocturia x 2   Musculoskeletal: Negative for arthralgias.  Skin: Negative for rash.  Neurological: Negative for headaches.  Hematological: Negative for adenopathy.  Psychiatric/Behavioral:       Denies depression/anxiety   Past Medical History  Diagnosis Date  . History of chicken pox   . History of shingles 04/2010    History   Social History  . Marital Status: Married    Spouse Name: N/A    Number of Children: 1  . Years of Education: N/A   Occupational History  . Not on file.   Social History Main Topics  . Smoking status: Current Every Day Smoker -- 2.00 packs/day    Types: Cigarettes  . Smokeless tobacco: Never Used  . Alcohol Use: 8.4 oz/week    14 Cans of beer per week  . Drug Use: No  . Sexual Activity: No   Other Topics Concern  . 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               Past Surgical History  Procedure Laterality Date  . No past surgeries      Family History  Problem Relation Age of Onset  . Sudden death Mother   . Rheumatic fever Mother   . Hypertension Neg Hx   . Hyperlipidemia Neg Hx   . Heart attack Neg Hx   . Diabetes Neg Hx   . Colon cancer Neg Hx   . Stomach cancer Neg Hx     Allergies  Allergen Reactions  . Acyclovir And Related Rash    Rash that looked like chicken pox    Current Outpatient Prescriptions on File Prior to Visit  Medication Sig Dispense Refill  . aspirin EC 81 MG tablet Take 81 mg by mouth daily.      . Calcium Carbonate-Vitamin D (CALTRATE 600+D) 600-400 MG-UNIT per tablet Take 1 tablet by mouth 2 (two) times daily.      . Cyanocobalamin (VITAMIN B 12 PO) Take by mouth daily.      . meloxicam (MOBIC) 7.5 MG tablet Take 1 tablet (7.5 mg total) by mouth daily.  14 tablet  0  . Multiple Vitamin (MULTIVITAMIN) tablet Take 1 tablet by mouth daily.      Marland Kitchen  varenicline (CHANTIX STARTING MONTH PAK) 0.5 MG X 11 & 1 MG X 42 tablet Take one 0.5 mg tablet by mouth once daily for 3 days, then increase to one 0.5 mg tablet twice daily for 4 days, then increase to one 1 mg tablet twice daily.  53 tablet  0   No current facility-administered medications on file prior to visit.    BP 140/80  Pulse 80  Temp(Src) 97.6 F (36.4 C) (Oral)  Resp 16  Ht 6' (1.829 m)  Wt 155 lb (70.308 kg)  BMI 21.02 kg/m2  SpO2 99%       Objective:   Physical Exam  Constitutional: He is oriented to person, place, and time. He appears well-developed and well-nourished. No distress.  HENT:  Head: Normocephalic and atraumatic.  Eyes: EOM are normal. Pupils are equal, round, and reactive to light.  Cardiovascular: Normal rate and regular rhythm.   No murmur heard. Pulmonary/Chest: Effort normal and breath sounds normal. No respiratory distress. He has no wheezes. He has no rales. He exhibits no  tenderness.  Abdominal: Soft. Bowel sounds are normal. He exhibits no distension and no mass. There is no tenderness. There is no rebound and no guarding.  Musculoskeletal: He exhibits no edema.  Lymphadenopathy:    He has no cervical adenopathy.  Neurological: He is alert and oriented to person, place, and time.  Skin: Skin is warm and dry.  Psychiatric: He has a normal mood and affect. His behavior is normal. Judgment and thought content normal.          Assessment & Plan:

## 2014-02-03 NOTE — Assessment & Plan Note (Signed)
Unchanged, refer to ortho, refill meloxicam.

## 2014-02-04 ENCOUNTER — Telehealth: Payer: Self-pay | Admitting: Family

## 2014-02-04 NOTE — Telephone Encounter (Signed)
All labs look good, except sodium mildly low. Was mildly low last time it was checked 2 years ago. Ok to liberalize sodium in diet and we will keep an eye on this.

## 2014-02-06 ENCOUNTER — Telehealth: Payer: Self-pay | Admitting: Family

## 2014-02-06 NOTE — Telephone Encounter (Signed)
Notified pt and he voices understanding. 

## 2014-02-06 NOTE — Telephone Encounter (Signed)
emmi emailed °

## 2014-03-02 ENCOUNTER — Other Ambulatory Visit: Payer: Self-pay | Admitting: Family

## 2014-06-08 ENCOUNTER — Ambulatory Visit (HOSPITAL_BASED_OUTPATIENT_CLINIC_OR_DEPARTMENT_OTHER)
Admission: RE | Admit: 2014-06-08 | Discharge: 2014-06-08 | Disposition: A | Discharge status: Home / Self Care | Payer: BLUE CROSS/BLUE SHIELD | Source: Ambulatory Visit | Attending: Family | Admitting: Family

## 2014-06-08 ENCOUNTER — Telehealth: Payer: Self-pay | Admitting: *Deleted

## 2014-06-08 ENCOUNTER — Ambulatory Visit (INDEPENDENT_AMBULATORY_CARE_PROVIDER_SITE_OTHER): Payer: BLUE CROSS/BLUE SHIELD | Admitting: Family

## 2014-06-08 ENCOUNTER — Encounter: Payer: Self-pay | Admitting: Family

## 2014-06-08 VITALS — BP 130/82 | HR 98 | Temp 97.7°F | Resp 18 | Ht 72.0 in | Wt 159.4 lb

## 2014-06-08 DIAGNOSIS — M8580 Other specified disorders of bone density and structure, unspecified site: Secondary | ICD-10-CM

## 2014-06-08 DIAGNOSIS — Z720 Tobacco use: Secondary | ICD-10-CM

## 2014-06-08 DIAGNOSIS — R0781 Pleurodynia: Secondary | ICD-10-CM | POA: Diagnosis present

## 2014-06-08 DIAGNOSIS — S2231XA Fracture of one rib, right side, initial encounter for closed fracture: Secondary | ICD-10-CM | POA: Insufficient documentation

## 2014-06-08 DIAGNOSIS — IMO0001 Reserved for inherently not codable concepts without codable children: Secondary | ICD-10-CM

## 2014-06-08 DIAGNOSIS — X58XXXA Exposure to other specified factors, initial encounter: Secondary | ICD-10-CM | POA: Insufficient documentation

## 2014-06-08 DIAGNOSIS — G5622 Lesion of ulnar nerve, left upper limb: Secondary | ICD-10-CM

## 2014-06-08 DIAGNOSIS — E871 Hypo-osmolality and hyponatremia: Secondary | ICD-10-CM

## 2014-06-08 DIAGNOSIS — R030 Elevated blood-pressure reading, without diagnosis of hypertension: Secondary | ICD-10-CM

## 2014-06-08 DIAGNOSIS — R0789 Other chest pain: Secondary | ICD-10-CM | POA: Insufficient documentation

## 2014-06-08 LAB — BASIC METABOLIC PANEL WITH GFR
BUN: 17 mg/dL (ref 6–23)
CO2: 30 meq/L (ref 19–32)
Calcium: 9.5 mg/dL (ref 8.4–10.5)
Chloride: 99 meq/L (ref 96–112)
Creatinine, Ser: 0.95 mg/dL (ref 0.40–1.50)
GFR: 84.83 mL/min
Glucose, Bld: 140 mg/dL — ABNORMAL HIGH (ref 70–99)
Potassium: 4.6 meq/L (ref 3.5–5.1)
Sodium: 134 meq/L — ABNORMAL LOW (ref 135–145)

## 2014-06-08 LAB — VITAMIN D 25 HYDROXY (VIT D DEFICIENCY, FRACTURES): VITD: 32.31 ng/mL (ref 30.00–100.00)

## 2014-06-08 MED ORDER — MELOXICAM 7.5 MG PO TABS: 7.5000 mg | ORAL_TABLET | Freq: Every day | ORAL | Status: DC

## 2014-06-08 NOTE — Assessment & Plan Note (Signed)
Management per ortho, unchanged.

## 2014-06-08 NOTE — Telephone Encounter (Signed)
-----   Message from Debbrah Alar, NP sent at 06/08/2014  1:34 PM EST ----- Please also let pt know that his sodium remains low. I would like him to return to the lab for serum osmolality, urine osmolality, urine sodium dx hyponatremia.

## 2014-06-08 NOTE — Assessment & Plan Note (Signed)
Will obtain R rib detail and chest x ray.

## 2014-06-08 NOTE — Progress Notes (Signed)
Pre visit review using our clinic review tool, if applicable. No additional management support is needed unless otherwise documented below in the visit note. 

## 2014-06-08 NOTE — Progress Notes (Signed)
Subjective:    Patient ID: Ian Hart, male    DOB: 1950-10-16, 64 y.o.   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   R lateral rib pain- thinks he may have pulled something a few weeks ago. Pain 4/74 with certain movements. Does not hurt with breathing.  Denies associated SOB or Chest pain.    Review of Systems See HPI  Past Medical History  Diagnosis Date  . History of chicken pox   . History of shingles 04/2010    History   Social History  . Marital Status: Married    Spouse Name: N/A  . Number of Children: 1  . Years of Education: N/A   Occupational History  . Not on file.   Social History Main Topics  . Smoking status: Current Every Day Smoker -- 2.00 packs/day    Types: Cigarettes  . Smokeless tobacco: Never Used  . Alcohol Use: 8.4 oz/week    14 Cans of beer per week  . Drug Use: No  . Sexual Activity: No   Other Topics Concern  . 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               Past Surgical History  Procedure Laterality Date  . No past surgeries      Family History  Problem Relation Age of Onset  . Sudden death Mother   . Rheumatic fever Mother   . Hypertension Neg Hx   . Hyperlipidemia Neg Hx   . Heart attack Neg Hx   . Diabetes Neg Hx   . Colon cancer Neg Hx   . Stomach cancer Neg Hx     Allergies  Allergen Reactions  . Acyclovir And Related Rash    Rash that looked like chicken pox    Current Outpatient Prescriptions  on File Prior to Visit  Medication Sig Dispense Refill  . aspirin EC 81 MG tablet Take 81 mg by mouth daily.    . Calcium Carbonate-Vitamin D (CALTRATE 600+D) 600-400 MG-UNIT per tablet Take 1 tablet by mouth 2 (two) times daily.    . Cyanocobalamin (VITAMIN B 12 PO) Take by mouth daily.    . meloxicam (MOBIC) 7.5 MG tablet TAKE 1 TABLET BY MOUTH DAILY 30 tablet 0  . Multiple Vitamin (MULTIVITAMIN) tablet Take 1 tablet by mouth daily.    . varenicline (CHANTIX STARTING MONTH PAK) 0.5 MG X 11 & 1 MG X 42 tablet Take one 0.5 mg tablet by mouth once daily for 3 days, then increase to one 0.5 mg tablet twice daily for 4 days, then increase to one 1 mg tablet twice daily. (Patient not taking: Reported on 06/08/2014) 53 tablet 0   No current facility-administered medications on file prior to visit.    BP 130/82 mmHg  Pulse 98  Temp(Src) 97.7 F (36.5 C) (Oral)  Resp 18  Ht 6' (1.829  m)  Wt 159 lb 6.4 oz (72.303 kg)  BMI 21.61 kg/m2  SpO2 99%       Objective:   Physical Exam  Constitutional: He is oriented to person, place, and time. He appears well-developed and well-nourished. No distress.  HENT:  Head: Normocephalic and atraumatic.  Cardiovascular: Normal rate and regular rhythm.   No murmur heard. Pulmonary/Chest: Effort normal and breath sounds normal. No respiratory distress. He has no wheezes. He has no rales.  Musculoskeletal: He exhibits no edema.  Right lateral rib tenderness to palpation  Neurological: He is alert and oriented to person, place, and time.  Skin: Skin is warm and dry.  Psychiatric: He has a normal mood and affect. His behavior is normal. Thought content normal.          Assessment & Plan:

## 2014-06-08 NOTE — Assessment & Plan Note (Signed)
Improved today. Monitor

## 2014-06-08 NOTE — Assessment & Plan Note (Signed)
Urged cessation/quit date. Not ready yet

## 2014-06-08 NOTE — Telephone Encounter (Signed)
Notified pt and he voices understanding. Will return 06/16/14 at 8:15 for below labs.  Future orders entered.

## 2014-06-08 NOTE — Assessment & Plan Note (Signed)
Obtain follow up bone density, and baseline vitamin D.

## 2014-06-08 NOTE — Patient Instructions (Addendum)
Please complete lab work prior to leaving. Complete x ray on the first floor. You will be contacted about your bone density. Set a quit date to quit smoking.  Schedule a fasting physical in November. Call sooner if needed.

## 2014-06-09 ENCOUNTER — Ambulatory Visit: Payer: BC Managed Care – PPO | Admitting: Family

## 2014-06-16 ENCOUNTER — Other Ambulatory Visit (INDEPENDENT_AMBULATORY_CARE_PROVIDER_SITE_OTHER): Payer: BLUE CROSS/BLUE SHIELD

## 2014-06-16 ENCOUNTER — Encounter: Payer: Self-pay | Admitting: Family

## 2014-06-16 ENCOUNTER — Ambulatory Visit (INDEPENDENT_AMBULATORY_CARE_PROVIDER_SITE_OTHER)
Admission: RE | Admit: 2014-06-16 | Discharge: 2014-06-16 | Disposition: A | Discharge status: Home / Self Care | Payer: BLUE CROSS/BLUE SHIELD | Source: Ambulatory Visit | Attending: Family | Admitting: Family

## 2014-06-16 DIAGNOSIS — M8580 Other specified disorders of bone density and structure, unspecified site: Secondary | ICD-10-CM

## 2014-06-16 DIAGNOSIS — E871 Hypo-osmolality and hyponatremia: Secondary | ICD-10-CM

## 2014-06-16 LAB — HEMOGLOBIN A1C: HEMOGLOBIN A1C: 5.6 % (ref 4.6–6.5)

## 2014-06-17 LAB — SODIUM, URINE, RANDOM: Sodium, Ur: 112 mEq/L

## 2014-06-17 LAB — OSMOLALITY, URINE: Osmolality, Ur: 572 mOsm/kg (ref 390–1090)

## 2014-06-17 LAB — OSMOLALITY: Osmolality: 288 mOsm/kg (ref 275–300)

## 2014-06-19 ENCOUNTER — Telehealth: Payer: Self-pay | Admitting: Family

## 2014-06-19 DIAGNOSIS — M810 Age-related osteoporosis without current pathological fracture: Secondary | ICD-10-CM

## 2014-06-19 DIAGNOSIS — E871 Hypo-osmolality and hyponatremia: Secondary | ICD-10-CM

## 2014-06-19 MED ORDER — ALENDRONATE SODIUM 70 MG PO TABS: 70.0000 mg | ORAL_TABLET | ORAL | Status: DC

## 2014-06-19 NOTE — Telephone Encounter (Signed)
Notified pt and he voices understanding. Lab appt scheduled for 06/21/14 at 8:30am. Rx sent.

## 2014-06-19 NOTE — Telephone Encounter (Addendum)
Reviewed his bone density test.  Shows osteoporosis and 10 yr prob of hip fracture is 5%. With this risk, it is recommended that we start fosamax for bone health.  It is also important for bone health that he quit smoking and get regular weight bearing exercise such as walking. Continue  caltrate 600mg  +D bid, obtain vitamin D level, PTH, (SPEP and UPEP to further evaluate cause for low sodium)

## 2014-06-21 ENCOUNTER — Other Ambulatory Visit (INDEPENDENT_AMBULATORY_CARE_PROVIDER_SITE_OTHER): Payer: BLUE CROSS/BLUE SHIELD

## 2014-06-21 DIAGNOSIS — M810 Age-related osteoporosis without current pathological fracture: Secondary | ICD-10-CM

## 2014-06-21 LAB — VITAMIN D 25 HYDROXY (VIT D DEFICIENCY, FRACTURES): VITD: 37.57 ng/mL (ref 30.00–100.00)

## 2014-06-22 ENCOUNTER — Encounter: Payer: Self-pay | Admitting: Family

## 2014-06-23 ENCOUNTER — Telehealth: Payer: Self-pay | Admitting: *Deleted

## 2014-06-23 NOTE — Telephone Encounter (Signed)
Pt called requesting lab results from 06/21/14. All results not final. Spoke with lab. They will see if lab can add on additional tests.

## 2014-06-23 NOTE — Addendum Note (Signed)
Addended by: Peggyann Shoals on: 06/23/2014 02:34 PM   Modules accepted: Orders

## 2014-06-26 LAB — PTH, INTACT AND CALCIUM

## 2014-06-27 ENCOUNTER — Other Ambulatory Visit: Payer: Self-pay | Admitting: *Deleted

## 2014-06-27 DIAGNOSIS — M810 Age-related osteoporosis without current pathological fracture: Secondary | ICD-10-CM

## 2014-06-27 DIAGNOSIS — E871 Hypo-osmolality and hyponatremia: Secondary | ICD-10-CM

## 2014-06-27 LAB — PROTEIN ELECTROPHORESIS, SERUM
ALBUMIN ELP: 61.1 % (ref 55.8–66.1)
ALPHA-1-GLOBULIN: 3.6 % (ref 2.9–4.9)
Alpha-2-Globulin: 9.9 % (ref 7.1–11.8)
BETA 2: 4.6 % (ref 3.2–6.5)
Beta Globulin: 6.9 % (ref 4.7–7.2)
Gamma Globulin: 13.9 % (ref 11.1–18.8)
Total Protein, Serum Electrophoresis: 5.5 g/dL — ABNORMAL LOW (ref 6.0–8.3)

## 2014-06-28 ENCOUNTER — Other Ambulatory Visit (INDEPENDENT_AMBULATORY_CARE_PROVIDER_SITE_OTHER): Payer: BLUE CROSS/BLUE SHIELD

## 2014-06-28 DIAGNOSIS — E871 Hypo-osmolality and hyponatremia: Secondary | ICD-10-CM

## 2014-06-28 DIAGNOSIS — M810 Age-related osteoporosis without current pathological fracture: Secondary | ICD-10-CM

## 2014-06-28 NOTE — Telephone Encounter (Signed)
Vit D normal.  Serum protein electrophoresis looks good.  Levada Dy was to contact him yesterday to have him return for urine protein electrophoresis and PTH as lab could not add these on.

## 2014-06-28 NOTE — Telephone Encounter (Signed)
Pt returned for urine protein electrophoresis and  PTH w/ Calcium.   Where here he was informed of recent lab results.  Pt verbalized understanding.//AB/CMA

## 2014-06-29 LAB — PTH, INTACT AND CALCIUM
CALCIUM: 9.6 mg/dL (ref 8.4–10.5)
PTH: 42 pg/mL (ref 14–64)

## 2014-06-30 ENCOUNTER — Encounter: Payer: Self-pay | Admitting: Family

## 2014-06-30 LAB — PROTEIN ELECTROPHORESIS, URINE REFLEX: Total Protein, Urine: 11 mg/dL

## 2014-07-05 ENCOUNTER — Other Ambulatory Visit: Payer: Self-pay | Admitting: Family

## 2014-08-28 ENCOUNTER — Other Ambulatory Visit: Payer: Self-pay | Admitting: Family

## 2014-08-29 NOTE — Telephone Encounter (Signed)
Please advise below request:  Medication name:  Name from pharmacy:  meloxicam (MOBIC) 7.5 MG tablet MELOXICAM 7.'5MG'$  TABLETS     Sig: TAKE 1 TABLET BY MOUTH DAILY    Dispense: 30 tablet   Refills: 0   Start: 08/28/2014   Class: Normal    Requested on: 08/28/2014    Originally ordered on: 01/02/2014 07/07/2014

## 2018-12-30 DIAGNOSIS — H43391 Other vitreous opacities, right eye: Secondary | ICD-10-CM | POA: Diagnosis not present

## 2020-01-18 ENCOUNTER — Telehealth: Payer: Self-pay | Admitting: Family

## 2020-01-18 NOTE — Telephone Encounter (Signed)
Patient would like to re-established care with you. I explained you not accepting new pt at the present time, but he insists I should ask you if you would also take his son Damen Windsor

## 2020-01-20 NOTE — Telephone Encounter (Signed)
OK 

## 2020-02-10 ENCOUNTER — Other Ambulatory Visit: Payer: Self-pay

## 2020-02-10 ENCOUNTER — Ambulatory Visit (INDEPENDENT_AMBULATORY_CARE_PROVIDER_SITE_OTHER): Payer: BC Managed Care – PPO | Admitting: Family

## 2020-02-10 ENCOUNTER — Encounter: Payer: Self-pay | Admitting: Family

## 2020-02-10 VITALS — BP 152/75 | HR 102 | Temp 98.1°F | Resp 16 | Ht 73.0 in | Wt 159.0 lb

## 2020-02-10 DIAGNOSIS — Z720 Tobacco use: Secondary | ICD-10-CM | POA: Diagnosis not present

## 2020-02-10 DIAGNOSIS — Z122 Encounter for screening for malignant neoplasm of respiratory organs: Secondary | ICD-10-CM

## 2020-02-10 DIAGNOSIS — Z23 Encounter for immunization: Secondary | ICD-10-CM | POA: Diagnosis not present

## 2020-02-10 MED ORDER — NICOTINE 21 MG/24HR TD PT24: 21.0000 mg | MEDICATED_PATCH | Freq: Every day | TRANSDERMAL | 0 refills | Status: DC

## 2020-02-10 MED ORDER — BUPROPION HCL ER (XL) 150 MG PO TB24: 150.0000 mg | ORAL_TABLET | Freq: Every day | ORAL | 5 refills | Status: DC

## 2020-02-10 NOTE — Addendum Note (Signed)
Addended by: Debbrah Alar on: 02/10/2020 04:04 PM   Modules accepted: Orders

## 2020-02-10 NOTE — Progress Notes (Signed)
Subjective:    Patient ID: Ian Hart, male    DOB: 06/17/1950, 69 y.o.   MRN: [id number]  HPI  Patient is a 69 yr old male who presents today to re-establish care.  Reports that he retired since his last visit.    Pmhx is significant for the following:  Tobacco abuse- 2 packs a day.  He is interested in quitting.  Previously tried chantix.  He states that he is motivated to quit smoking.  His wife, who is also a smoker wants to quit as well.  Brother died of covid. His brother was 61 years older than him. Reports that his brother's death was very difficult for him because it reminded him of his own mortality.  He worries about his 54 year old son and if his son is prepared to be independent should something happen to him.  He denies thoughts of hurting self or others.  He does report that he has a fear of dying.  Had flu shot.  He is also been vaccinated against COVID-19 and plans to get a booster vaccine.  Review of Systems See HPI  Past Medical History:  Diagnosis Date   History of chicken pox    History of shingles 04/2010     Social History   Socioeconomic History   Marital status: Married    Spouse name: Not on file   Number of children: 1   Years of education: Not on file   Highest education level: Not on file  Occupational History   Occupation: retired  Tobacco Use   Smoking status: Current Every Day Smoker    Packs/day: 2.00    Types: Cigarettes   Smokeless tobacco: Never Used  Substance and Sexual Activity   Alcohol use: Yes    Alcohol/week: 5.0 standard drinks    Types: 3 Cans of beer, 2 Shots of liquor per week    Comment: daily   Drug use: No   Sexual activity: Yes  Other Topics Concern   Not on file  Social History Narrative   Regular exercise:  2-3 x weekly (sports, yardwork)   Caffeine use:  3 cups coffee daily   73 yr old son   Wife   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            Social Determinants of Health   Financial Resource Strain:    Difficulty of Paying Living Expenses: Not on file  Food Insecurity:    Worried About Charity fundraiser in the Last Year: Not on file   [employer] of Food in the Last Year: Not on file  Transportation Needs:    Lack of Transportation (Medical): Not on file   Lack of Transportation (Non-Medical): Not on file  Physical Activity:    Days of Exercise per Week: Not on file   Minutes of Exercise per Session: Not on file  Stress:    Feeling of Stress : Not on file  Social Connections:    Frequency of Communication with Friends and Family: Not on file   Frequency of Social Gatherings with Friends and Family: Not on file   Attends Religious Services: Not on file   Active Member of Clubs or Organizations: Not on file   Attends Archivist Meetings: Not on file   Marital Status: Not on file  Intimate Partner Violence:    Fear of Current or Ex-Partner: Not on file   Emotionally  Abused: Not on file   Physically Abused: Not on file   Sexually Abused: Not on file    Past Surgical History:  Procedure Laterality Date   NO PAST SURGERIES      Family History  Problem Relation Age of Onset   Sudden death Mother    Rheumatic fever Mother    Hypertension Neg Hx    Hyperlipidemia Neg Hx    Heart attack Neg Hx    Diabetes Neg Hx    Colon cancer Neg Hx    Stomach cancer Neg Hx     Allergies  Allergen Reactions   Acyclovir And Related Rash    Rash that looked like chicken pox    Current Outpatient Medications on File Prior to Visit  Medication Sig Dispense Refill   alendronate (FOSAMAX) 70 MG tablet Take 1 tablet (70 mg total) by mouth once a week. Take with a full glass of water on an empty stomach. 12 tablet 3   aspirin EC 81 MG tablet Take 81 mg by mouth daily.     Calcium Carbonate-Vitamin D (CALTRATE 600+D) 600-400 MG-UNIT per tablet Take 1 tablet by mouth 2 (two) times daily.       Cyanocobalamin (VITAMIN B 12 PO) Take by mouth daily.     meloxicam (MOBIC) 7.5 MG tablet TAKE 1 TABLET BY MOUTH DAILY 30 tablet 0   Multiple Vitamin (MULTIVITAMIN) tablet Take 1 tablet by mouth daily.     varenicline (CHANTIX STARTING MONTH PAK) 0.5 MG X 11 & 1 MG X 42 tablet Take one 0.5 mg tablet by mouth once daily for 3 days, then increase to one 0.5 mg tablet twice daily for 4 days, then increase to one 1 mg tablet twice daily. 53 tablet 0   No current facility-administered medications on file prior to visit.    BP (!) 152/75 (BP Location: Right Arm, Patient Position: Sitting, Cuff Size: Small)    Pulse (!) 102    Temp 98.1 F (36.7 C) (Oral)    Resp 16    Ht 6\' 1"  (1.854 m)    Wt 159 lb (72.1 kg)    SpO2 99%    BMI 20.98 kg/m       Objective:   Physical Exam Constitutional:      General: He is not in acute distress.    Appearance: He is well-developed.  HENT:     Head: Normocephalic and atraumatic.  Cardiovascular:     Rate and Rhythm: Normal rate and regular rhythm.     Heart sounds: No murmur heard.   Pulmonary:     Effort: Pulmonary effort is normal. No respiratory distress.     Breath sounds: Decreased breath sounds (throughout lungs) present. No wheezing or rales.  Skin:    General: Skin is warm and dry.  Neurological:     Mental Status: He is alert and oriented to person, place, and time.  Psychiatric:        Behavior: Behavior normal.        Thought Content: Thought content normal.           Assessment & Plan:  Tobacco abuse-unfortunately, Chantix has been temporarily pulled from the market.  I have advised that he begin Wellbutrin 150 mg twice daily, and the NicoDerm patch.  I also recommended that he complete an annual CT chest to screen for lung cancer.  Pneumovax 23 today.  Elevated blood pressure reading-mild elevation noted today.  Plan to recheck at his upcoming physical.  42 minutes spent on today's visit.  Time was spent reviewing medical  record, interviewing and counseling the patient.  This visit occurred during the SARS-CoV-2 public health emergency.  Safety protocols were in place, including screening questions prior to the visit, additional usage of staff PPE, and extensive cleaning of exam room while observing appropriate contact time as indicated for disinfecting solutions.

## 2020-02-10 NOTE — Patient Instructions (Signed)
Please begin wellbutrin twice daily. (to help quit smoking)Begin nicoderm patch- Begin with step 1 (21 mg/day) for 6 weeks, followed by step 2 (14 mg/day) for 2 weeks; finish with step 3 (7 mg/day) for 2 weeks. Please complete lab work prior to leaving.

## 2020-02-13 DIAGNOSIS — C3490 Malignant neoplasm of unspecified part of unspecified bronchus or lung: Secondary | ICD-10-CM

## 2020-02-13 HISTORY — DX: Malignant neoplasm of unspecified part of unspecified bronchus or lung: C34.90

## 2020-02-15 ENCOUNTER — Ambulatory Visit (HOSPITAL_BASED_OUTPATIENT_CLINIC_OR_DEPARTMENT_OTHER)
Admission: RE | Admit: 2020-02-15 | Discharge: 2020-02-15 | Disposition: A | Discharge status: Home / Self Care | Payer: Medicare Other | Source: Ambulatory Visit | Attending: Family | Admitting: Family

## 2020-02-15 ENCOUNTER — Other Ambulatory Visit: Payer: Self-pay

## 2020-02-15 ENCOUNTER — Telehealth: Payer: Self-pay | Admitting: Family

## 2020-02-15 DIAGNOSIS — Z122 Encounter for screening for malignant neoplasm of respiratory organs: Secondary | ICD-10-CM | POA: Diagnosis not present

## 2020-02-15 DIAGNOSIS — F17210 Nicotine dependence, cigarettes, uncomplicated: Secondary | ICD-10-CM | POA: Diagnosis not present

## 2020-02-15 DIAGNOSIS — R911 Solitary pulmonary nodule: Secondary | ICD-10-CM

## 2020-02-15 NOTE — Telephone Encounter (Signed)
Patient returned my call.  Reviewed results and plan with pt. He verbalizes understanding. Agreeable to proceed with PET.   Rod Holler, could you please call pulmonary and schedule pulmonary consult and notify pt?

## 2020-02-15 NOTE — Telephone Encounter (Signed)
Received a call from Dr. Mindi Junker, radiology re:  Lung mass on lung cancer screening. He is recommending PET CT rather than 3 month follow up based on the appearance of the lesion.   Left message on pt's cell requesting call back.

## 2020-02-15 NOTE — Telephone Encounter (Signed)
Patient informed he has been scheduled to be seen at [facility] pulmonology on 02-17-20 at 3:45 pm. Address and phone number provided to patient.

## 2020-02-17 ENCOUNTER — Encounter: Payer: Self-pay | Admitting: Pulmonary Disease

## 2020-02-17 ENCOUNTER — Ambulatory Visit (INDEPENDENT_AMBULATORY_CARE_PROVIDER_SITE_OTHER): Payer: BC Managed Care – PPO | Admitting: Pulmonary Disease

## 2020-02-17 ENCOUNTER — Other Ambulatory Visit: Payer: Self-pay

## 2020-02-17 VITALS — BP 134/72 | HR 102 | Temp 97.3°F | Ht 73.5 in | Wt 162.0 lb

## 2020-02-17 DIAGNOSIS — Z87891 Personal history of nicotine dependence: Secondary | ICD-10-CM | POA: Diagnosis not present

## 2020-02-17 DIAGNOSIS — J432 Centrilobular emphysema: Secondary | ICD-10-CM

## 2020-02-17 DIAGNOSIS — R911 Solitary pulmonary nodule: Secondary | ICD-10-CM

## 2020-02-17 MED ORDER — ANORO ELLIPTA 62.5-25 MCG/INH IN AEPB: 1.0000 | INHALATION_SPRAY | Freq: Every day | RESPIRATORY_TRACT | 0 refills | Status: DC

## 2020-02-17 MED ORDER — UMECLIDINIUM-VILANTEROL 62.5-25 MCG/INH IN AEPB: 1.0000 | INHALATION_SPRAY | Freq: Every day | RESPIRATORY_TRACT | 2 refills | Status: DC

## 2020-02-17 NOTE — Progress Notes (Signed)
Synopsis: Referred in November 2021 for abnormal lung cancer screening CT by Debbrah Alar, NP  Subjective:   PATIENT ID: Ian Hart GENDER: male DOB: Apr 05, 1951, MRN: [id number]  Chief Complaint  Patient presents with  . Consult    lung nodule    This is a 69 year old gentleman with a past medical history of tobacco abuse.  He states that he has quit as of this week after finding out the news of his lung cancer screening CT.  He used to work for [employer] in [employer].  He had a routine physical and was referred for a lung cancer screening CT.  This was completed on 02/15/2020.  It revealed a lung RADS 4 a suspicious lesion within the posterior medial right upper lobe spiculated at 13.6 mm concerning for a primary bronchogenic carcinoma.  Patient denies fevers chills night sweats weight loss or hemoptysis.  Of note his CT scan also reveals evidence of paraseptal and centrilobular emphysema.    Past Medical History:  Diagnosis Date  . History of chicken pox   . History of shingles 04/2010     Family History  Problem Relation Age of Onset  . Sudden death Mother   . Rheumatic fever Mother   . Hypertension Neg Hx   . Hyperlipidemia Neg Hx   . Heart attack Neg Hx   . Diabetes Neg Hx   . Colon cancer Neg Hx   . Stomach cancer Neg Hx      Past Surgical History:  Procedure Laterality Date  . NO PAST SURGERIES      Social History   Socioeconomic History  . Marital status: Married    Spouse name: Not on file  . Number of children: 1  . Years of education: Not on file  . Highest education level: Not on file  Occupational History  . Occupation: retired  Tobacco Use  . Smoking status: Current Every Day Smoker    Packs/day: 2.00    Types: Cigarettes  . Smokeless tobacco: Never Used  Substance and Sexual Activity  . Alcohol use: Yes    Alcohol/week: 5.0 standard drinks    Types: 3 Cans of beer, 2 Shots of liquor per week    Comment: daily   . Drug use: No  . Sexual activity: Yes  Other Topics Concern  . 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           Social Determinants of Health   Financial Resource Strain:   . Difficulty of Paying Living Expenses: Not on file  Food Insecurity:   . Worried About Charity fundraiser in the Last Year: Not on file  . Ran Out of Food in the Last Year: Not on file  Transportation Needs:   . Lack of Transportation (Medical): Not on file  . Lack of Transportation (Non-Medical): Not on file  Physical Activity:   . Days of Exercise per Week: Not on file  . Minutes of Exercise per Session: Not on file  Stress:   . Feeling of Stress : Not on file  Social Connections:   . Frequency of Communication with Friends and Family: Not on file  . Frequency of Social Gatherings with  Friends and Family: Not on file  . Attends Religious Services: Not on file  . Active Member of Clubs or Organizations: Not on file  . Attends Archivist Meetings: Not on file  . Marital Status: Not on file  Intimate Partner Violence:   . Fear of Current or Ex-Partner: Not on file  . Emotionally Abused: Not on file  . Physically Abused: Not on file  . Sexually Abused: Not on file     Allergies  Allergen Reactions  . Acyclovir And Related Rash    Rash that looked like chicken pox     Outpatient Medications Prior to Visit  Medication Sig Dispense Refill  . aspirin EC 81 MG tablet Take 81 mg by mouth daily.    Marland Kitchen buPROPion (WELLBUTRIN XL) 150 MG 24 hr tablet Take 1 tablet (150 mg total) by mouth daily. 60 tablet 5  . Calcium Carbonate-Vitamin D (CALTRATE 600+D) 600-400 MG-UNIT per tablet Take 1 tablet by mouth 2 (two) times daily.    . Cyanocobalamin (VITAMIN B 12 PO) Take by mouth daily.    . Multiple  Vitamin (MULTIVITAMIN) tablet Take 1 tablet by mouth daily.    . meloxicam (MOBIC) 7.5 MG tablet TAKE 1 TABLET BY MOUTH DAILY (Patient not taking: Reported on 02/17/2020) 30 tablet 0  . alendronate (FOSAMAX) 70 MG tablet Take 1 tablet (70 mg total) by mouth once a week. Take with a full glass of water on an empty stomach. (Patient not taking: Reported on 02/17/2020) 12 tablet 3  . nicotine (NICODERM CQ) 21 mg/24hr patch Place 1 patch (21 mg total) onto the skin daily. (Patient not taking: Reported on 02/17/2020) 42 patch 0  . varenicline (CHANTIX STARTING MONTH PAK) 0.5 MG X 11 & 1 MG X 42 tablet Take one 0.5 mg tablet by mouth once daily for 3 days, then increase to one 0.5 mg tablet twice daily for 4 days, then increase to one 1 mg tablet twice daily. (Patient not taking: Reported on 02/17/2020) 53 tablet 0   No facility-administered medications prior to visit.    Review of Systems  Constitutional: Negative for chills, fever, malaise/fatigue and weight loss.  HENT: Negative for hearing loss, sore throat and tinnitus.   Eyes: Negative for blurred vision and double vision.  Respiratory: Positive for cough. Negative for hemoptysis, sputum production, shortness of breath, wheezing and stridor.   Cardiovascular: Negative for chest pain, palpitations, orthopnea, leg swelling and PND.  Gastrointestinal: Negative for abdominal pain, constipation, diarrhea, heartburn, nausea and vomiting.  Genitourinary: Negative for dysuria, hematuria and urgency.  Musculoskeletal: Negative for joint pain and myalgias.  Skin: Negative for itching and rash.  Neurological: Negative for dizziness, tingling, weakness and headaches.  Endo/Heme/Allergies: Negative for environmental allergies. Does not bruise/bleed easily.  Psychiatric/Behavioral: Negative for depression. The patient is not nervous/anxious and does not have insomnia.   All other systems reviewed and are negative.    Objective:  Physical Exam Vitals  reviewed.  Constitutional:      General: He is not in acute distress.    Appearance: He is well-developed.  HENT:     Head: Normocephalic and atraumatic.     Mouth/Throat:     Pharynx: No oropharyngeal exudate.  Eyes:     Conjunctiva/sclera: Conjunctivae normal.     Pupils: Pupils are equal, round, and reactive to light.  Neck:     Vascular: No JVD.     Trachea: No tracheal deviation.     Comments: Loss of supraclavicular  fat Cardiovascular:     Rate and Rhythm: Normal rate and regular rhythm.     Heart sounds: S1 normal and S2 normal.     Comments: Distant heart tones Pulmonary:     Effort: No tachypnea or accessory muscle usage.     Breath sounds: No stridor. Decreased breath sounds (throughout all lung fields) present. No wheezing, rhonchi or rales.  Abdominal:     General: Bowel sounds are normal. There is no distension.     Palpations: Abdomen is soft.     Tenderness: There is no abdominal tenderness.  Musculoskeletal:        General: Deformity (muscle wasting ) present.  Skin:    General: Skin is warm and dry.     Capillary Refill: Capillary refill takes less than 2 seconds.     Findings: No rash.  Neurological:     Mental Status: He is alert and oriented to person, place, and time.  Psychiatric:        Behavior: Behavior normal.      Vitals:   02/17/20 1604  BP: 134/72  Pulse: (!) 102  Temp: (!) 97.3 F (36.3 C)  TempSrc: Tympanic  SpO2: 98%  Weight: 162 lb (73.5 kg)  Height: 6' 1.5" (1.867 m)   98% on  RA BMI Readings from Last 3 Encounters:  02/17/20 21.08 kg/m  02/10/20 20.98 kg/m  06/08/14 21.62 kg/m   Wt Readings from Last 3 Encounters:  02/17/20 162 lb (73.5 kg)  02/10/20 159 lb (72.1 kg)  06/08/14 159 lb 6.4 oz (72.3 kg)     CBC    Component Value Date/Time   WBC 5.6 02/03/2014 0806   RBC 4.92 02/03/2014 0806   HGB 15.2 02/03/2014 0806   HCT 46.0 02/03/2014 0806   PLT 252.0 02/03/2014 0806   MCV 93.5 02/03/2014 0806   MCH 31.0  11/12/2011 0917   MCHC 33.0 02/03/2014 0806   RDW 13.4 02/03/2014 0806   LYMPHSABS 1.0 02/03/2014 0806   MONOABS 0.7 02/03/2014 0806   EOSABS 0.2 02/03/2014 0806   BASOSABS 0.0 02/03/2014 0806     Chest Imaging: 02/15/2020 lung cancer screening CT: 13.6 mm right upper lobe pulmonary nodule concerning for primary bronchogenic carcinoma. The patient's images have been independently reviewed by me. =   Pulmonary Functions Testing Results: No flowsheet data found.  FeNO:   Pathology:   Echocardiogram:   Heart Catheterization:     Assessment & Plan:     ICD-10-CM   1. Lung nodule  R91.1 NM PET Image Initial (PI) Skull Base To Thigh    Pulmonary Function Test    Ambulatory referral to Cardiothoracic Surgery    umeclidinium-vilanterol (ANORO ELLIPTA) 62.5-25 MCG/INH AEPB  2. Centrilobular emphysema (HCC)  J43.2 umeclidinium-vilanterol (ANORO ELLIPTA) 62.5-25 MCG/INH AEPB  3. Former smoker  Z87.891     Discussion: This is a 69 year old, former smoker quit as of this week after revealing news of his lung cancer screening CT.  He has an abnormal lung cancer screening CT with a right upper lobe 13.6 mm nodule.  He has evidence of centrilobular emphysema consistent with a underlying diagnosis of COPD.  Has not had pulmonary function test in the past.  Plan: Recommended remaining's smoke-free. We discussed all of the options today in the office to include tissue biopsy of the lesion to include percutaneous approach versus a navigational bronchoscopy. I explained with the characteristics of the lesion how small it was I would actually recommend him having a PET  scan completed as well as pulmonary function test and consideration for resection directly without biopsy. Patient is agreeable to this plan. A referral was placed to cardiothoracic surgery for consideration of lobectomy pending PFT and PET results. We will have them completed for perioperative clearance ASAP. As for the  patient's centrilobular emphysema we will start him on Anoro Ellipta. Patient was given samples of this today in the office.  And a new prescription was sent to his pharmacy.  Inhaler instruction: Using a placebo Ellipta device the patient was instructed on how to use the inhaler properly with the most appropriate technique.  Patient was able to demonstrate this.  Case was discussed with Dr. Kipp Brood from cardiothoracic surgery.    Current Outpatient Medications:  .  aspirin EC 81 MG tablet, Take 81 mg by mouth daily., Disp: , Rfl:  .  buPROPion (WELLBUTRIN XL) 150 MG 24 hr tablet, Take 1 tablet (150 mg total) by mouth daily., Disp: 60 tablet, Rfl: 5 .  Calcium Carbonate-Vitamin D (CALTRATE 600+D) 600-400 MG-UNIT per tablet, Take 1 tablet by mouth 2 (two) times daily., Disp: , Rfl:  .  Cyanocobalamin (VITAMIN B 12 PO), Take by mouth daily., Disp: , Rfl:  .  Multiple Vitamin (MULTIVITAMIN) tablet, Take 1 tablet by mouth daily., Disp: , Rfl:  .  meloxicam (MOBIC) 7.5 MG tablet, TAKE 1 TABLET BY MOUTH DAILY (Patient not taking: Reported on 02/17/2020), Disp: 30 tablet, Rfl: 0 .  umeclidinium-vilanterol (ANORO ELLIPTA) 62.5-25 MCG/INH AEPB, Inhale 1 puff into the lungs daily., Disp: 2 each, Rfl: 0  I spent 60 minutes dedicated to the care of this patient on the date of this encounter to include pre-visit review of records, face-to-face time with the patient discussing conditions above, post visit ordering of testing, clinical documentation with the electronic health record, making appropriate referrals as documented, and communicating necessary findings to members of the patients care team.   Garner Nash, DO [facility] 02/17/2020 6:40 PM

## 2020-02-17 NOTE — Patient Instructions (Signed)
Thank you for visiting Dr. Valeta Harms at [facility]. Today we recommend the following:  Orders Placed This Encounter  Procedures   NM PET Image Initial (PI) Skull Base To Thigh   Ambulatory referral to Cardiothoracic Surgery   Pulmonary Function Test   Return in about 2 months (around 04/18/2020) for Dr. Valeta Harms .    Please do your part to reduce the spread of COVID-19.

## 2020-03-01 ENCOUNTER — Other Ambulatory Visit: Payer: Self-pay

## 2020-03-01 ENCOUNTER — Encounter (HOSPITAL_COMMUNITY)
Admission: RE | Admit: 2020-03-01 | Discharge: 2020-03-01 | Disposition: A | Discharge status: Home / Self Care | Payer: BC Managed Care – PPO | Source: Ambulatory Visit | Attending: Pulmonary Disease | Admitting: Pulmonary Disease

## 2020-03-01 DIAGNOSIS — I700 Atherosclerosis of aorta: Secondary | ICD-10-CM | POA: Insufficient documentation

## 2020-03-01 DIAGNOSIS — R911 Solitary pulmonary nodule: Secondary | ICD-10-CM | POA: Insufficient documentation

## 2020-03-01 DIAGNOSIS — J439 Emphysema, unspecified: Secondary | ICD-10-CM | POA: Insufficient documentation

## 2020-03-01 LAB — GLUCOSE, CAPILLARY: Glucose-Capillary: 96 mg/dL (ref 70–99)

## 2020-03-01 MED ORDER — FLUDEOXYGLUCOSE F - 18 (FDG) INJECTION
8.2000 | Freq: Once | INTRAVENOUS | Status: AC | PRN
  Administered 2020-03-01: 8.2 via INTRAVENOUS

## 2020-03-02 ENCOUNTER — Ambulatory Visit (INDEPENDENT_AMBULATORY_CARE_PROVIDER_SITE_OTHER): Payer: BC Managed Care – PPO | Admitting: Pulmonary Disease

## 2020-03-02 DIAGNOSIS — R911 Solitary pulmonary nodule: Secondary | ICD-10-CM | POA: Diagnosis not present

## 2020-03-02 LAB — PULMONARY FUNCTION TEST
DL/VA % pred: 66 %
DL/VA: 2.66 ml/min/mmHg/L
DLCO cor % pred: 68 %
DLCO cor: 19.82 ml/min/mmHg
DLCO unc % pred: 68 %
DLCO unc: 19.82 ml/min/mmHg
FEF 25-75 Post: 1.83 L/sec
FEF 25-75 Pre: 2.14 L/sec
FEF2575-%Change-Post: -14 %
FEF2575-%Pred-Post: 63 %
FEF2575-%Pred-Pre: 74 %
FEV1-%Change-Post: -2 %
FEV1-%Pred-Post: 85 %
FEV1-%Pred-Pre: 86 %
FEV1-Post: 3.19 L
FEV1-Pre: 3.26 L
FEV1FVC-%Change-Post: 0 %
FEV1FVC-%Pred-Pre: 91 %
FEV6-%Change-Post: -2 %
FEV6-%Pred-Post: 98 %
FEV6-%Pred-Pre: 100 %
FEV6-Post: 4.72 L
FEV6-Pre: 4.82 L
FEV6FVC-%Pred-Post: 105 %
FEV6FVC-%Pred-Pre: 105 %
FVC-%Change-Post: -2 %
FVC-%Pred-Post: 93 %
FVC-%Pred-Pre: 95 %
FVC-Post: 4.72 L
FVC-Pre: 4.82 L
Post FEV1/FVC ratio: 67 %
Post FEV6/FVC ratio: 100 %
Pre FEV1/FVC ratio: 68 %
Pre FEV6/FVC Ratio: 100 %
RV % pred: 118 %
RV: 3.1 L
TLC % pred: 104 %
TLC: 8.09 L

## 2020-03-02 NOTE — Progress Notes (Signed)
Full PFT performed today. °

## 2020-03-02 NOTE — Patient Instructions (Signed)
Full PFT performed today. °

## 2020-03-05 ENCOUNTER — Other Ambulatory Visit: Payer: Self-pay

## 2020-03-05 ENCOUNTER — Encounter: Payer: Self-pay | Admitting: *Deleted

## 2020-03-05 ENCOUNTER — Encounter: Payer: Self-pay | Admitting: Thoracic Surgery (Cardiothoracic Vascular Surgery)

## 2020-03-05 ENCOUNTER — Other Ambulatory Visit: Payer: Self-pay | Admitting: Thoracic Surgery (Cardiothoracic Vascular Surgery)

## 2020-03-05 ENCOUNTER — Institutional Professional Consult (permissible substitution) (INDEPENDENT_AMBULATORY_CARE_PROVIDER_SITE_OTHER): Payer: BC Managed Care – PPO | Admitting: Thoracic Surgery (Cardiothoracic Vascular Surgery)

## 2020-03-05 ENCOUNTER — Other Ambulatory Visit: Payer: Self-pay | Admitting: *Deleted

## 2020-03-05 VITALS — BP 143/77 | HR 93 | Resp 18 | Ht 73.0 in | Wt 160.0 lb

## 2020-03-05 DIAGNOSIS — R911 Solitary pulmonary nodule: Secondary | ICD-10-CM

## 2020-03-05 NOTE — Progress Notes (Signed)
[facility] [address]       [place] [address]             [phone]                    Ian Hart [place] Medical Record #[id number] Date of Birth: [birth year]  Referring: Garner Nash, DO Primary Care: Debbrah Alar, NP Primary Cardiologist: No primary care provider on file.  Chief Complaint:    Chief Complaint  Patient presents with  . Lung Lesion    Initial surgical consult, PET 11/18, ct chest 11/3, PFT 11/19    History of Present Illness:    Ian Hart 69 y.o. male referred for surgical evaluation of right upper lobe pulmonary nodule.  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  That scan ultimately found a pulmonary nodule, and he was subsequently referred to Dr. Valeta Harms for further evaluation.  He has since stopped smoking, and subsequently underwent a PET/CT which showed avidity in the pulmonary nodule as well as a subcarinal lymph node.  He denies any symptoms.  His weight has been stable for several years.  He denies any neurologic symptoms including headaches or vision changes.  He occasionally has some exertional dyspnea, was able to walk approximately 62miles this summer when visiting the [place].  Smoking Hx: Former smoker as of 2 weeks ago previously smoked 2 packs a day.   Zubrod Score: At the time of surgery this patient's most appropriate activity status/level should be described as: [x]     0    Normal activity, no symptoms []     1    Restricted in physical strenuous activity but ambulatory, able to do out light work []     2    Ambulatory and capable of self care, unable to do work activities, up and about               >50 % of waking hours                              []     3    Only limited self care, in bed greater than  50% of waking hours []     4    Completely disabled, no self care, confined to bed or chair []     5    Moribund   Past Medical History:  Diagnosis Date  . History of chicken pox   . History of shingles 04/2010    Past Surgical History:  Procedure Laterality Date  . NO PAST SURGERIES      Family History  Problem Relation Age of Onset  . Sudden death Mother   . Rheumatic fever Mother   . Hypertension Neg Hx   . Hyperlipidemia Neg Hx   . Heart attack Neg Hx   . Diabetes Neg Hx   . Colon cancer Neg Hx   . Stomach cancer Neg Hx      Social History   Tobacco Use  Smoking Status Current Every Day Smoker  . Packs/day: 2.00  . Types: Cigarettes  Smokeless Tobacco Never Used    Social History   Substance and Sexual Activity  Alcohol Use Yes  . Alcohol/week:  5.0 standard drinks  . Types: 3 Cans of beer, 2 Shots of liquor per week   Comment: daily     Allergies  Allergen Reactions  . Acyclovir And Related Rash    Rash that looked like chicken pox    Current Outpatient Medications  Medication Sig Dispense Refill  . aspirin EC 81 MG tablet Take 81 mg by mouth daily.    Marland Kitchen buPROPion (WELLBUTRIN XL) 150 MG 24 hr tablet Take 1 tablet (150 mg total) by mouth daily. 60 tablet 5  . Calcium Carbonate-Vitamin D (CALTRATE 600+D) 600-400 MG-UNIT per tablet Take 1 tablet by mouth 2 (two) times daily.    . Cyanocobalamin (VITAMIN B 12 PO) Take by mouth daily.    . meloxicam (MOBIC) 7.5 MG tablet TAKE 1 TABLET BY MOUTH DAILY 30 tablet 0  . Multiple Vitamin (MULTIVITAMIN) tablet Take 1 tablet by mouth daily.    Marland Kitchen umeclidinium-vilanterol (ANORO ELLIPTA) 62.5-25 MCG/INH AEPB Inhale 1 puff into the lungs daily. 2 each 0  . umeclidinium-vilanterol (ANORO ELLIPTA) 62.5-25 MCG/INH AEPB Inhale 1 puff into the lungs daily. 3 each 2   No current facility-administered medications for this visit.    Review of Systems  Constitutional: Negative.   Respiratory: Positive for shortness of  breath.   Cardiovascular: Negative.   Gastrointestinal: Negative.   Neurological: Negative.      PHYSICAL EXAMINATION: BP (!) 143/77 (BP Location: Right Arm, Patient Position: Sitting)   Pulse 93   Resp 18   Ht 6\' 1"  (1.854 m)   Wt 160 lb (72.6 kg)   SpO2 98% Comment: RA with mask on  BMI 21.11 kg/m  Physical Exam Constitutional:      General: He is not in acute distress.    Appearance: Normal appearance. He is not ill-appearing.  HENT:     Head: Normocephalic and atraumatic.  Eyes:     Extraocular Movements: Extraocular movements intact.  Cardiovascular:     Rate and Rhythm: Normal rate.  Pulmonary:     Effort: Pulmonary effort is normal. No respiratory distress.  Abdominal:     General: Abdomen is flat. There is no distension.  Musculoskeletal:        General: Normal range of motion.     Cervical back: Normal range of motion.  Skin:    General: Skin is warm and dry.  Neurological:     General: No focal deficit present.     Mental Status: He is alert and oriented to person, place, and time.     Diagnostic Studies & Laboratory data:     Recent Radiology Findings:   NM PET Image Initial (PI) Skull Base To Thigh  Result Date: 03/02/2020 CLINICAL DATA:  Initial treatment strategy for screening CT demonstrating a right upper lobe 14 mm pulmonary lung nodule. Ninety pack-year smoking history. Left arm COVID-19 vaccine 3/21. EXAM: NUCLEAR MEDICINE PET SKULL BASE TO THIGH TECHNIQUE: 8.2 mCi F-18 FDG was injected intravenously. Full-ring PET imaging was performed from the skull base to thigh after the radiotracer. CT data was obtained and used for attenuation correction and anatomic localization. Fasting blood glucose: 96 mg/dl COMPARISON:  Lung cancer screening CT 02/15/2020. FINDINGS: Mediastinal blood pool activity: SUV max 2.4 Liver activity: SUV max NA NECK: No areas of abnormal hypermetabolism. Incidental CT findings: No cervical adenopathy. Left carotid atherosclerosis.  CHEST: Subcarinal node measures 9 mm and a S.U.V. max of 3.6 on 86/4. Spiculated posterior right upper lobe pulmonary nodule measures 1.3 x 1.0 cm and  a S.U.V. max of 9.0 on 22/8. Incidental CT findings: Moderate centrilobular emphysema. Aortic atherosclerosis. ABDOMEN/PELVIS: No abdominopelvic nodal hypermetabolism. The posterior interpolar left renal soft tissue density lesion demonstrates hypermetabolism at a S.U.V. max of 4.4 on 130/4. Incidental CT findings: Normal adrenal glands. Abdominal aortic atherosclerosis. Extensive colonic diverticulosis. SKELETON: Hypermetabolism corresponding to an anterior right seventh rib healing or healed fracture on 103/4. No worrisome osseous lesion. Incidental CT findings: none IMPRESSION: 1. Hypermetabolic spiculated posterior right upper lobe pulmonary nodule, consistent with primary bronchogenic carcinoma. 2. Isolated hypermetabolic subcarinal node,moderately suspicious for nodal metastasis. 3. No evidence of extrathoracic hypermetabolic metastasis. 4. Left renal lesion which is indeterminate based on noncontrast CT characteristics, but hypermetabolic and suspicious for renal cell carcinoma. Consider dedicated pre and post contrast renal protocol abdominal CT or MRI. 5. Aortic atherosclerosis (ICD10-I70.0) and emphysema (ICD10-J43.9). Electronically Signed   By: Abigail Miyamoto M.D.   On: 03/02/2020 14:08   CT CHEST LUNG CA SCREEN LOW DOSE W/O CM  Result Date: 02/15/2020 CLINICAL DATA:  Lung cancer screening. 98 pack-year history. Current asymptomatic smoker. EXAM: CT CHEST WITHOUT CONTRAST LOW-DOSE FOR LUNG CANCER SCREENING TECHNIQUE: Multidetector CT imaging of the chest was performed following the standard protocol without IV contrast. COMPARISON:  None. FINDINGS: Cardiovascular: The heart size appears within normal limits. No pericardial effusion. Aortic atherosclerosis. Coronary artery calcification. Mediastinum/Nodes: No enlarged mediastinal, hilar, or axillary lymph  nodes. Thyroid gland, trachea, and esophagus demonstrate no significant findings. Lungs/Pleura: Advanced changes of paraseptal and centrilobular emphysema identified. Within the posteromedial right upper lobe there is a spiculated lesion which has an equivalent diameter of 13.6 mm. Two additional smaller nodules are identified within the right upper lobe measuring up to 5.3 mm. Focal branching density within the posteromedial right lower lobe is favored to represent an area of mucoid impaction within a dilated bronchial. Upper Abdomen: No acute abnormality. Hyperdense left kidney lesion is only partially visualized measuring 2 cm and 32 Hounsfield units. Incompletely characterized without IV contrast. Musculoskeletal: Spondylosis identified within the thoracic spine. Subacute to chronic right anterior rib fracture noted, image 210/4. IMPRESSION: 1. Lung-RADS 4A, suspicious. Follow up low-dose chest CT without contrast in 3 months (please use the following order, "CT CHEST LCS NODULE FOLLOW-UP /O CM") is recommended. Alternatively, PET may be considered when there is a solid component 31mm or larger. 2. Emphysema and aortic atherosclerosis. Aortic Atherosclerosis (ICD10-I70.0) and Emphysema (ICD10-J43.9). Electronically Signed   By: Kerby Moors M.D.   On: 02/15/2020 11:08       I have independently reviewed the above radiology studies  and reviewed the findings with the patient.   Recent Lab Findings: Lab Results  Component Value Date   WBC 5.6 02/03/2014   HGB 15.2 02/03/2014   HCT 46.0 02/03/2014   PLT 252.0 02/03/2014   GLUCOSE 140 (H) 06/08/2014   CHOL 162 02/03/2014   TRIG 25.0 02/03/2014   HDL 92.10 02/03/2014   LDLCALC 65 02/03/2014   ALT 20 02/03/2014   AST 26 02/03/2014   NA 134 (L) 06/08/2014   K 4.6 06/08/2014   CL 99 06/08/2014   CREATININE 0.95 06/08/2014   BUN 17 06/08/2014   CO2 30 06/08/2014   TSH 1.29 02/03/2014   HGBA1C 5.6 06/16/2014     PFTs: - FVC: 95% - FEV1:  86% -DLCO: 68%  Problem List: 1.3 cm right upper lobe pulmonary nodule with an SUV of 9 9 mm subcarinal lymph node with an SUV of 3.6 Significant past smoking history with  some evidence of emphysema.  Assessment / Plan:   This is a 69 year old gentleman with a 1.3 cm right upper lobe pulmonary nodule and a concerning subcarinal lymph node both of which were avid on PET/CT.  My concern is that this may represent N2 disease.  I discussed this case with Dr. Valeta Harms and we both agree that he will require a navigational bronchoscopy for tissue sample of the pulmonary nodule as well as an endobronchial ultrasound with sampling of the subcarinal lymph nodes.  The goal would be to rule out small cell lung cancer.  If he does have non-small cell lung cancer and a pulmonary nodule, and ensuring that he does not have N2 disease will be crucial as well.  We have discussed performing this at the same time as a potential right robotic assisted thoracoscopy with right upper lobectomy if the subcarinal lymph node is negative, and the pulmonary nodule is consistent with non-small cell lung cancer.  I discussed this at length with the patient and his wife and they both are in agreement.  He will require a stress test in the next few weeks.  And he is tentatively scheduled for March 22, 2020.     I  spent 40 minutes with  the patient face to face and greater then 50% of the time was spent in counseling and coordination of care.    Lajuana Matte 03/05/2020 5:37 PM

## 2020-03-07 ENCOUNTER — Telehealth (HOSPITAL_COMMUNITY): Payer: Self-pay | Admitting: *Deleted

## 2020-03-07 NOTE — Telephone Encounter (Signed)
Close encounter 

## 2020-03-14 ENCOUNTER — Ambulatory Visit (HOSPITAL_COMMUNITY)
Admission: RE | Admit: 2020-03-14 | Discharge: 2020-03-14 | Disposition: A | Discharge status: Home / Self Care | Payer: BC Managed Care – PPO | Source: Ambulatory Visit | Attending: Cardiology | Admitting: Cardiology

## 2020-03-14 ENCOUNTER — Other Ambulatory Visit: Payer: Self-pay

## 2020-03-14 DIAGNOSIS — Z01810 Encounter for preprocedural cardiovascular examination: Secondary | ICD-10-CM

## 2020-03-14 DIAGNOSIS — R0602 Shortness of breath: Secondary | ICD-10-CM | POA: Insufficient documentation

## 2020-03-14 DIAGNOSIS — R911 Solitary pulmonary nodule: Secondary | ICD-10-CM | POA: Diagnosis not present

## 2020-03-14 LAB — MYOCARDIAL PERFUSION IMAGING
LV dias vol: 143 mL (ref 62–150)
LV sys vol: 66 mL
Peak HR: 101 {beats}/min
Rest HR: 76 {beats}/min
SDS: 3
SRS: 2
SSS: 5
TID: 0.97

## 2020-03-14 MED ORDER — REGADENOSON 0.4 MG/5ML IV SOLN
0.4000 mg | Freq: Once | INTRAVENOUS | Status: AC
Start: 2020-03-14 — End: 2020-03-14
  Administered 2020-03-14: 0.4 mg via INTRAVENOUS

## 2020-03-14 MED ORDER — TECHNETIUM TC 99M TETROFOSMIN IV KIT
10.2000 | PACK | Freq: Once | INTRAVENOUS | Status: AC | PRN
  Administered 2020-03-14: 10.2 via INTRAVENOUS
  Filled 2020-03-14: qty 11

## 2020-03-14 MED ORDER — TECHNETIUM TC 99M TETROFOSMIN IV KIT
32.1000 | PACK | Freq: Once | INTRAVENOUS | Status: AC | PRN
  Administered 2020-03-14: 30.2 via INTRAVENOUS
  Filled 2020-03-14: qty 33

## 2020-03-16 ENCOUNTER — Encounter: Payer: Medicare Other | Admitting: Thoracic Surgery (Cardiothoracic Vascular Surgery)

## 2020-03-20 ENCOUNTER — Ambulatory Visit (HOSPITAL_COMMUNITY): Admission: RE | Admit: 2020-03-20 | Payer: BC Managed Care – PPO | Source: Ambulatory Visit

## 2020-03-20 ENCOUNTER — Ambulatory Visit (HOSPITAL_COMMUNITY)
Admission: RE | Admit: 2020-03-20 | Discharge: 2020-03-20 | Disposition: A | Discharge status: Home / Self Care | Payer: BC Managed Care – PPO | Source: Ambulatory Visit | Attending: Thoracic Surgery (Cardiothoracic Vascular Surgery) | Admitting: Thoracic Surgery (Cardiothoracic Vascular Surgery)

## 2020-03-20 ENCOUNTER — Other Ambulatory Visit: Payer: Self-pay

## 2020-03-20 ENCOUNTER — Encounter (HOSPITAL_COMMUNITY): Payer: Self-pay

## 2020-03-20 ENCOUNTER — Encounter (HOSPITAL_COMMUNITY)
Admission: RE | Admit: 2020-03-20 | Discharge: 2020-03-20 | Disposition: A | Discharge status: Home / Self Care | Payer: BC Managed Care – PPO | Source: Ambulatory Visit | Attending: Pulmonary Disease | Admitting: Pulmonary Disease

## 2020-03-20 ENCOUNTER — Other Ambulatory Visit (HOSPITAL_COMMUNITY)
Admission: RE | Admit: 2020-03-20 | Discharge: 2020-03-20 | Disposition: A | Discharge status: Home / Self Care | Payer: BC Managed Care – PPO | Source: Ambulatory Visit | Attending: Pulmonary Disease | Admitting: Pulmonary Disease

## 2020-03-20 DIAGNOSIS — Z20822 Contact with and (suspected) exposure to covid-19: Secondary | ICD-10-CM | POA: Insufficient documentation

## 2020-03-20 DIAGNOSIS — R911 Solitary pulmonary nodule: Secondary | ICD-10-CM | POA: Insufficient documentation

## 2020-03-20 DIAGNOSIS — Z01818 Encounter for other preprocedural examination: Secondary | ICD-10-CM | POA: Insufficient documentation

## 2020-03-20 DIAGNOSIS — Z79899 Other long term (current) drug therapy: Secondary | ICD-10-CM | POA: Insufficient documentation

## 2020-03-20 DIAGNOSIS — I700 Atherosclerosis of aorta: Secondary | ICD-10-CM | POA: Insufficient documentation

## 2020-03-20 DIAGNOSIS — Z7982 Long term (current) use of aspirin: Secondary | ICD-10-CM | POA: Insufficient documentation

## 2020-03-20 DIAGNOSIS — Z7289 Other problems related to lifestyle: Secondary | ICD-10-CM | POA: Insufficient documentation

## 2020-03-20 DIAGNOSIS — F17210 Nicotine dependence, cigarettes, uncomplicated: Secondary | ICD-10-CM | POA: Insufficient documentation

## 2020-03-20 DIAGNOSIS — J439 Emphysema, unspecified: Secondary | ICD-10-CM | POA: Insufficient documentation

## 2020-03-20 LAB — TYPE AND SCREEN
ABO/RH(D): A NEG
Antibody Screen: NEGATIVE

## 2020-03-20 LAB — SURGICAL PCR SCREEN
MRSA, PCR: NEGATIVE
Staphylococcus aureus: NEGATIVE

## 2020-03-20 LAB — COMPREHENSIVE METABOLIC PANEL
ALT: 19 U/L (ref 0–44)
AST: 20 U/L (ref 15–41)
Albumin: 4.2 g/dL (ref 3.5–5.0)
Alkaline Phosphatase: 62 U/L (ref 38–126)
Anion gap: 13 (ref 5–15)
BUN: 10 mg/dL (ref 8–23)
CO2: 19 mmol/L — ABNORMAL LOW (ref 22–32)
Calcium: 9 mg/dL (ref 8.9–10.3)
Chloride: 97 mmol/L — ABNORMAL LOW (ref 98–111)
Creatinine, Ser: 0.66 mg/dL (ref 0.61–1.24)
GFR, Estimated: >60 mL/min (ref >60)
Glucose, Bld: 91 mg/dL (ref 70–99)
Potassium: 3.9 mmol/L (ref 3.5–5.1)
Sodium: 129 mmol/L — ABNORMAL LOW (ref 135–145)
Total Bilirubin: 0.5 mg/dL (ref 0.3–1.2)
Total Protein: 7 g/dL (ref 6.5–8.1)

## 2020-03-20 LAB — URINALYSIS, ROUTINE W REFLEX MICROSCOPIC
Bilirubin Urine: NEGATIVE
Glucose, UA: NEGATIVE mg/dL
Hgb urine dipstick: NEGATIVE
Ketones, ur: NEGATIVE mg/dL
Nitrite: NEGATIVE
Protein, ur: NEGATIVE mg/dL
Specific Gravity, Urine: 1.005 (ref 1.005–1.030)
pH: 6 (ref 5.0–8.0)

## 2020-03-20 LAB — BLOOD GAS, ARTERIAL
Acid-base deficit: 2.4 mmol/L — ABNORMAL HIGH (ref 0.0–2.0)
Bicarbonate: 21.3 mmol/L (ref 20.0–28.0)
FIO2: 21
O2 Saturation: 96.6 %
Patient temperature: 37
pCO2 arterial: 33.3 mmHg (ref 32.0–48.0)
pH, Arterial: 7.422 (ref 7.350–7.450)
pO2, Arterial: 83.7 mmHg (ref 83.0–108.0)

## 2020-03-20 LAB — CBC
HCT: 49.2 % (ref 39.0–52.0)
Hemoglobin: 16.4 g/dL (ref 13.0–17.0)
MCH: 31.1 pg (ref 26.0–34.0)
MCHC: 33.3 g/dL (ref 30.0–36.0)
MCV: 93.2 fL (ref 80.0–100.0)
Platelets: 218 10*3/uL (ref 150–400)
RBC: 5.28 MIL/uL (ref 4.22–5.81)
RDW: 12.1 % (ref 11.5–15.5)
WBC: 7.1 10*3/uL (ref 4.0–10.5)
nRBC: 0 % (ref 0.0–0.2)

## 2020-03-20 LAB — PROTIME-INR
INR: 1 (ref 0.8–1.2)
Prothrombin Time: 12.9 seconds (ref 11.4–15.2)

## 2020-03-20 LAB — APTT: aPTT: 28 seconds (ref 24–36)

## 2020-03-20 LAB — SARS CORONAVIRUS 2 (TAT 6-24 HRS): SARS Coronavirus 2: NEGATIVE

## 2020-03-20 NOTE — Progress Notes (Signed)
Your procedure is scheduled on Thursday December 9th, 2021.  Report to [facility] Main Entrance "A" at 07:00 A.M., and check in at the Admitting office for CT scan prior to procedure at 10:45 A.M.  Call this number if you have problems the morning of surgery: [phone]  Call [phone] if you have any questions prior to your surgery date Monday-Friday 8am-4pm   Remember: Do not eat or drink after midnight the night before your surgery   Take these medicines the morning of surgery with A SIP OF WATER: buPROPion (WELLBUTRIN XL)   If needed: umeclidinium-vilanterol (ANORO ELLIPTA)  Follow your surgeon's instructions on when to stop Aspirin.  If no instructions were given by your surgeon then you will need to call the office to get those instructions.    As of today, STOP taking any Aleve, Naproxen, Ibuprofen, Motrin, Advil, Goody's, BC's, all herbal medications, fish oil, and all vitamins.   The Morning of Surgery  Do not wear jewelry  Do not wear lotions, powders, colognes, or deodorant Men may shave face and neck.  Do not bring valuables to the hospital.  [facility] is not responsible for any belongings or valuables.  If you are a smoker, DO NOT Smoke 24 hours prior to surgery  If you wear a CPAP at night please bring your mask the morning of surgery   Remember that you must have someone to transport you home after your surgery, and remain with you for 24 hours if you are discharged the same day.   Please bring cases for contacts, glasses, hearing aids, dentures or bridgework because it cannot be worn into surgery.    Leave your suitcase in the car.  After surgery it may be brought to your room.  For patients admitted to the hospital, discharge time will be determined by your treatment team.  Patients discharged the day of surgery will not be allowed to drive home.    Special instructions:   [facility]- Preparing For Surgery  Before surgery, you can play an  important role. Because skin is not sterile, your skin needs to be as free of germs as possible. You can reduce the number of germs on your skin by washing with CHG (chlorahexidine gluconate) Soap before surgery.  CHG is an antiseptic cleaner which kills germs and bonds with the skin to continue killing germs even after washing.    Oral Hygiene is also important to reduce your risk of infection.  Remember - BRUSH YOUR TEETH THE MORNING OF SURGERY WITH YOUR REGULAR TOOTHPASTE  Please do not use if you have an allergy to CHG or antibacterial soaps. If your skin becomes reddened/irritated stop using the CHG.  Do not shave (including legs and underarms) for at least 48 hours prior to first CHG shower. It is OK to shave your face.  Please follow these instructions carefully.   1. Shower the NIGHT BEFORE SURGERY and the MORNING OF SURGERY with CHG Soap.   2. If you chose to wash your hair and body, wash as usual with your normal shampoo and body-wash/soap.  3. Rinse your hair and body thoroughly to remove the shampoo and soap.  4. Apply CHG directly to the skin (ONLY FROM THE NECK DOWN) and wash gently with a scrungie or a clean washcloth.   5. Do not use on open wounds or open sores. Avoid contact with your eyes, ears, mouth and genitals (private parts). Wash Face and genitals (private parts)  with your normal  soap.   6. Wash thoroughly, paying special attention to the area where your surgery will be performed.  7. Thoroughly rinse your body with warm water from the neck down.  8. DO NOT shower/wash with your normal soap after using and rinsing off the CHG Soap.  9. Pat yourself dry with a CLEAN TOWEL.  10. Wear CLEAN PAJAMAS to bed the night before surgery  11. Place CLEAN SHEETS on your bed the night of your first shower and DO NOT SLEEP WITH PETS.  12. Wear comfortable clothes the morning of surgery.     Day of Surgery:  Please shower the morning of surgery with the CHG soap Do  not apply any deodorants/lotions. Please wear clean clothes to the hospital/surgery center.   Remember to brush your teeth WITH YOUR REGULAR TOOTHPASTE.   Please read over the following fact sheets that you were given.

## 2020-03-20 NOTE — Pre-Procedure Instructions (Signed)
Ian Hart  03/20/2020      Your procedure is scheduled on Thursday, December 8.  Report to Admitting office after your CT Scan scheduled for 7:00 AM.                           Your surgery or procedure is scheduled to begin at 10:45 AM   Call this number if you have problems the morning of surgery: [phone]  This is the number for the Pre- Surgical Desk.                For any other questions, please call [phone], Monday - Friday 8 AM - 4 PM.   Remember:  Do not eat or drink after midnight Wednesday, December 8.  buPROPion (WELLBUTRIN XL)             umeclidinium-vilanterol (ANORO ELLIPTA)   Take these medicines the morning.1 Week prior to surgery STOP taking Aspirin Products (Goody Powder, Excedrin Migraine), Ibuprofen (Advil), Naproxen (Aleve), Vitamins and Herbal Products (ie Fish Oil).   Follow your surgeon's instructions regarding Aspirin.   Special instructions:    [facility]- Preparing For Surgery  Before surgery, you can play an important role. Because skin is not sterile, your skin needs to be as free of germs as possible. You can reduce the number of germs on your skin by washing with CHG (chlorahexidine gluconate) Soap before surgery.  CHG is an antiseptic cleaner which kills germs and bonds with the skin to continue killing germs even after washing.    Oral Hygiene is also important to reduce your risk of infection.  Remember - BRUSH YOUR TEETH THE MORNING OF SURGERY WITH YOUR REGULAR TOOTHPASTE  Please do not use if you have an allergy to CHG or antibacterial soaps. If your skin becomes reddened/irritated stop using the CHG.  Do not shave (including legs and underarms) for at least 48 hours prior to first CHG shower. It is OK to shave your face.  Please follow these instructions carefully.   1. Shower the NIGHT BEFORE SURGERY and the MORNING OF SURGERY with CHG.   2. If you chose to wash your hair, wash your hair first as usual with your normal  shampoo.  3. After you shampoo, wash your face and private area with the soap you use at home, then rinse your hair and body thoroughly to remove the shampoo and soap.  4. Use CHG as you would any other liquid soap. You can apply CHG directly to the skin and wash gently with a scrungie or a clean washcloth.   5. Apply the CHG Soap to your body ONLY FROM THE NECK DOWN.  Do not use on open wounds or open sores. Avoid contact with your eyes, ears, mouth and genitals (private parts).   6. Wash thoroughly, paying special attention to the area where your surgery will be performed.  7. Thoroughly rinse your body with warm water from the neck down.  8. DO NOT shower/wash with your normal soap after using and rinsing off the CHG Soap.  9. Pat yourself dry with a CLEAN TOWEL.  10. Wear CLEAN PAJAMAS to bed the night before surgery, wear comfortable clothes the morning of surgery  11. Place CLEAN SHEETS on your bed the night of your first shower and DO NOT SLEEP WITH PETS.  Day of Surgery: Shower as instructed above. Do not apply any deodorants/lotions, powders or colognes.  Please wear  clean clothes to the hospital/surgery center.   Remember to brush your teeth WITH YOUR REGULAR TOOTHPASTE.  Do not wear jewelry, make-up or nail polish.  Do not shave 48 hours prior to surgery.  Men may shave face and neck.  Do not bring valuables to the hospital.  [facility] is not responsible for any belongings or valuables.  Contacts, dentures or bridgework may not be worn into surgery.  Leave your suitcase in the car.  After surgery it may be brought to your room.  For patients admitted to the hospital, discharge time will be determined by your treatment team.  Patients discharged the day of surgery will not be allowed to drive home.   Please read over the fact sheets that you were given.

## 2020-03-20 NOTE — Progress Notes (Signed)
Called and talked with patient and asked them to be at [facility] admitting to check in on Thursday morning before procedure at 0700 to have CT scan prior to procedure. Pt voiced understanding

## 2020-03-20 NOTE — Progress Notes (Signed)
PCP Inda Castle, MD Cardiologist - denies  Chest x-ray - 03/20/20 EKG - 03/20/20 Stress Test - 03/14/20 ECHO - denies Cardiac Cath - denies  Blood Thinner Instructions: n/a Aspirin Instructions: Follow your surgeon's instructions on when to stop Aspirin.  If no instructions were given by your surgeon then you will need to call the office to get those instructions.    Per pt he plans on stopping ASA tomorrow - LD ASA 03/20/20  COVID TEST- 03/20/20  Coronavirus Screening  Have you experienced the following symptoms:  Cough yes/no: No Fever (>100.73F)  yes/no: No Runny nose yes/no: No Sore throat yes/no: No Difficulty breathing/shortness of breath  yes/no: No  Have you or a family member traveled in the last 14 days and where? yes/no: No   If the patient indicates "YES" to the above questions, their PAT will be rescheduled to limit the exposure to others and, the surgeon will be notified. THE PATIENT WILL NEED TO BE ASYMPTOMATIC FOR 14 DAYS.   If the patient is not experiencing any of these symptoms, the PAT nurse will instruct them to NOT bring anyone with them to their appointment since they may have these symptoms or traveled as well.   Please remind your patients and families that hospital visitation restrictions are in effect and the importance of the restrictions.     Anesthesia review: n/a  Patient denies shortness of breath, fever, cough and chest pain at PAT appointment   All instructions explained to the patient, with a verbal understanding of the material. Patient agrees to go over the instructions while at home for a better understanding. Patient also instructed to self quarantine after being tested for COVID-19. The opportunity to ask questions was provided.

## 2020-03-21 NOTE — Progress Notes (Signed)
Anesthesia Chart Review:  Case: [id number] Date/Time: 03/22/20 1145   Procedure: XI ROBOTIC ASSISTED THORASCOPY-RIGHT UPPER LOBECTOMY (Right Chest)   Anesthesia type: General   Pre-op diagnosis: Right Lung Nodule   Location: [facility] OR ROOM 10 / [place] OR   Surgeons: Lajuana Matte, MD      DISCUSSION: Patient is a 69 year old male scheduled for the above procedure.   History includes smoking. Daily alcohol use is documented as 3 cans of bear and 2 shots of liquor per week. He was recently found to have a hypermetabolic RUL lung nodule that was initially noted on lung cancer screening CT.   Na 129. He is on Wellbutrin XL for smoking cessation and side effect profile includes hyponatremia. Daily alcohol use is also documented. Will order ISTAT for day of surgery to re-evaluate for hyponatremia/check for stability.   Presurgical COVID-19 test negative. For ISTAT on arrival. Anesthesia team to evaluate on the day of surgery.     VS: BP 140/72   Pulse 80   Temp 36.6 C (Oral)   Resp 18   Ht 6\' 1"  (1.854 m)   Wt 72.3 kg   SpO2 98%   BMI 21.02 kg/m   PROVIDERS: Debbrah Alar, NP is PCP  June Leap, DO is pulmonologist  LABS: Preoperative labs noted. (all labs ordered are listed, but only abnormal results are displayed)  Labs Reviewed  BLOOD GAS, ARTERIAL - Abnormal; Notable for the following components:      Result Value   Acid-base deficit 2.4 (*)    Allens test (pass/fail) BRACHIAL ARTERY (*)    All other components within normal limits  COMPREHENSIVE METABOLIC PANEL - Abnormal; Notable for the following components:   Sodium 129 (*)    Chloride 97 (*)    CO2 19 (*)    All other components within normal limits  URINALYSIS, ROUTINE W REFLEX MICROSCOPIC - Abnormal; Notable for the following components:   Leukocytes,Ua SMALL (*)    Bacteria, UA RARE (*)    All other components within normal limits  SURGICAL PCR SCREEN  APTT  CBC  PROTIME-INR  TYPE AND SCREEN      IMAGES: CXR 03/20/20: In process.  PET Scan 03/01/20: IMPRESSION: 1. Hypermetabolic spiculated posterior right upper lobe pulmonary nodule, consistent with primary bronchogenic carcinoma. 2. Isolated hypermetabolic subcarinal node,moderately suspicious for nodal metastasis. 3. No evidence of extrathoracic hypermetabolic metastasis. 4. Left renal lesion which is indeterminate based on noncontrast CT characteristics, but hypermetabolic and suspicious for renal cell carcinoma. Consider dedicated pre and post contrast renal protocol abdominal CT or MRI. 5. Aortic atherosclerosis (ICD10-I70.0) and emphysema (ICD10-J43.9).  CT Chest (lunc cancer screen) 02/15/20: IMPRESSION: 1. Lung-RADS 4A, suspicious. Follow up low-dose chest CT without contrast in 3 months (please use the following order, "CT CHEST LCS NODULE FOLLOW-UP /O CM") is recommended. Alternatively, PET may be considered when there is a solid component 49mm or larger. 2. Emphysema and aortic atherosclerosis.   EKG: 03/20/20: NSR   CV: Nuclear stress test 03/14/20:  The left ventricular ejection fraction is mildly decreased (45-54%).  Nuclear stress EF: 53%. No wall motion abnormalities noted. Apical thinning noted.  There was no ST segment deviation noted during stress.  This is a low risk study. No signs of ischemia.    Past Medical History:  Diagnosis Date  . History of chicken pox   . History of shingles 04/2010    Past Surgical History:  Procedure Laterality Date  . NO PAST SURGERIES  MEDICATIONS: . ascorbic acid (VITAMIN C) 500 MG tablet  . aspirin EC 81 MG tablet  . buPROPion (WELLBUTRIN XL) 150 MG 24 hr tablet  . Multiple Vitamin (MULTIVITAMIN) tablet  . nicotine (NICODERM CQ - DOSED IN MG/24 HOURS) 21 mg/24hr patch  . umeclidinium-vilanterol (ANORO ELLIPTA) 62.5-25 MCG/INH AEPB  . vitamin B-12 (CYANOCOBALAMIN) 1000 MCG tablet   No current facility-administered medications for this  encounter.  Reported last ASA 03/20/20.    [name], PA-C Surgical Short Stay/Anesthesiology [facility] Phone [phone] [facility] Phone [phone] 03/21/2020 9:50 AM

## 2020-03-21 NOTE — Anesthesia Preprocedure Evaluation (Addendum)
Anesthesia Evaluation  Patient identified by MRN, date of birth, ID band Patient awake    Reviewed: Allergy & Precautions, NPO status , Patient's Chart, lab work & pertinent test results  Airway Mallampati: II  TM Distance: >3 FB Neck ROM: Full    Dental no notable dental hx. (+) Poor Dentition, Chipped,    Pulmonary Current Smoker and Patient abstained from smoking.,    Pulmonary exam normal breath sounds clear to auscultation       Cardiovascular Exercise Tolerance: Good Normal cardiovascular exam Rhythm:Regular Rate:Normal  03/14/20 Myoview  The left ventricular ejection fraction is mildly decreased (45-54%).  Nuclear stress EF: 53%. No wall motion abnormalities noted. Apical thinning noted.  There was no ST segment deviation noted during stress.  This is a low risk study. No signs of ischemia    Neuro/Psych  Neuromuscular disease negative psych ROS   GI/Hepatic negative GI ROS, Neg liver ROS,   Endo/Other  negative endocrine ROS  Renal/GU      Musculoskeletal negative musculoskeletal ROS (+)   Abdominal   Peds  Hematology   Anesthesia Other Findings RUL nodule All: Acylovir  Reproductive/Obstetrics                          Anesthesia Physical Anesthesia Plan  ASA: III  Anesthesia Plan: General   Post-op Pain Management:    Induction: Intravenous  PONV Risk Score and Plan: 1 and Treatment may vary due to age or medical condition and Ondansetron  Airway Management Planned: Oral ETT  Additional Equipment:   Intra-op Plan:   Post-operative Plan: Extubation in OR  Informed Consent: I have reviewed the patients History and Physical, chart, labs and discussed the procedure including the risks, benefits and alternatives for the proposed anesthesia with the patient or authorized representative who has indicated his/her understanding and acceptance.     Dental advisory  given  Plan Discussed with: CRNA  Anesthesia Plan Comments: (PAT note written 03/21/2020 by [name], PA-C. For ISTAT to re-evaluate hyponatremia. Had low risk preoperative stress test. )      Anesthesia Quick Evaluation

## 2020-03-22 ENCOUNTER — Encounter (HOSPITAL_COMMUNITY)
Admission: RE | Disposition: A | Discharge status: Home / Self Care | Payer: Self-pay | Source: Home / Self Care | Attending: Pulmonary Disease

## 2020-03-22 ENCOUNTER — Other Ambulatory Visit: Payer: Self-pay

## 2020-03-22 ENCOUNTER — Encounter (HOSPITAL_COMMUNITY): Payer: Self-pay | Admitting: Pulmonary Disease

## 2020-03-22 ENCOUNTER — Ambulatory Visit (HOSPITAL_COMMUNITY): Payer: BC Managed Care – PPO

## 2020-03-22 ENCOUNTER — Ambulatory Visit (HOSPITAL_COMMUNITY)
Admission: RE | Admit: 2020-03-22 | Discharge: 2020-03-22 | Disposition: A | Discharge status: Home / Self Care | Payer: BC Managed Care – PPO | Attending: Pulmonary Disease | Admitting: Pulmonary Disease

## 2020-03-22 ENCOUNTER — Ambulatory Visit (HOSPITAL_COMMUNITY): Payer: BC Managed Care – PPO | Admitting: Vascular Surgery

## 2020-03-22 DIAGNOSIS — R911 Solitary pulmonary nodule: Secondary | ICD-10-CM

## 2020-03-22 DIAGNOSIS — Z7982 Long term (current) use of aspirin: Secondary | ICD-10-CM | POA: Insufficient documentation

## 2020-03-22 DIAGNOSIS — Z9889 Other specified postprocedural states: Secondary | ICD-10-CM

## 2020-03-22 DIAGNOSIS — J984 Other disorders of lung: Secondary | ICD-10-CM | POA: Diagnosis not present

## 2020-03-22 DIAGNOSIS — B49 Unspecified mycosis: Secondary | ICD-10-CM | POA: Diagnosis not present

## 2020-03-22 DIAGNOSIS — C801 Malignant (primary) neoplasm, unspecified: Secondary | ICD-10-CM | POA: Insufficient documentation

## 2020-03-22 DIAGNOSIS — I2510 Atherosclerotic heart disease of native coronary artery without angina pectoris: Secondary | ICD-10-CM | POA: Diagnosis not present

## 2020-03-22 DIAGNOSIS — C771 Secondary and unspecified malignant neoplasm of intrathoracic lymph nodes: Secondary | ICD-10-CM | POA: Insufficient documentation

## 2020-03-22 DIAGNOSIS — R599 Enlarged lymph nodes, unspecified: Secondary | ICD-10-CM | POA: Diagnosis not present

## 2020-03-22 DIAGNOSIS — C3411 Malignant neoplasm of upper lobe, right bronchus or lung: Secondary | ICD-10-CM | POA: Diagnosis not present

## 2020-03-22 DIAGNOSIS — R590 Localized enlarged lymph nodes: Secondary | ICD-10-CM | POA: Diagnosis not present

## 2020-03-22 DIAGNOSIS — I700 Atherosclerosis of aorta: Secondary | ICD-10-CM | POA: Diagnosis not present

## 2020-03-22 DIAGNOSIS — Z87891 Personal history of nicotine dependence: Secondary | ICD-10-CM | POA: Diagnosis not present

## 2020-03-22 DIAGNOSIS — M810 Age-related osteoporosis without current pathological fracture: Secondary | ICD-10-CM | POA: Diagnosis not present

## 2020-03-22 DIAGNOSIS — Z791 Long term (current) use of non-steroidal anti-inflammatories (NSAID): Secondary | ICD-10-CM | POA: Diagnosis not present

## 2020-03-22 DIAGNOSIS — R918 Other nonspecific abnormal finding of lung field: Secondary | ICD-10-CM | POA: Diagnosis not present

## 2020-03-22 DIAGNOSIS — R846 Abnormal cytological findings in specimens from respiratory organs and thorax: Secondary | ICD-10-CM | POA: Diagnosis not present

## 2020-03-22 DIAGNOSIS — M47814 Spondylosis without myelopathy or radiculopathy, thoracic region: Secondary | ICD-10-CM | POA: Diagnosis not present

## 2020-03-22 DIAGNOSIS — M419 Scoliosis, unspecified: Secondary | ICD-10-CM | POA: Diagnosis not present

## 2020-03-22 DIAGNOSIS — R030 Elevated blood-pressure reading, without diagnosis of hypertension: Secondary | ICD-10-CM | POA: Diagnosis not present

## 2020-03-22 DIAGNOSIS — J432 Centrilobular emphysema: Secondary | ICD-10-CM | POA: Diagnosis not present

## 2020-03-22 DIAGNOSIS — Z79899 Other long term (current) drug therapy: Secondary | ICD-10-CM | POA: Insufficient documentation

## 2020-03-22 HISTORY — PX: BRONCHIAL BIOPSY: SHX5109

## 2020-03-22 HISTORY — PX: BRONCHIAL NEEDLE ASPIRATION BIOPSY: SHX5106

## 2020-03-22 HISTORY — PX: VIDEO BRONCHOSCOPY WITH ENDOBRONCHIAL NAVIGATION: SHX6175

## 2020-03-22 HISTORY — PX: BRONCHIAL WASHINGS: SHX5105

## 2020-03-22 HISTORY — PX: VIDEO BRONCHOSCOPY WITH ENDOBRONCHIAL ULTRASOUND: SHX6177

## 2020-03-22 HISTORY — PX: BRONCHIAL BRUSHINGS: SHX5108

## 2020-03-22 HISTORY — PX: FIDUCIAL MARKER PLACEMENT: SHX6858

## 2020-03-22 LAB — POCT I-STAT, CHEM 8
BUN: 11 mg/dL (ref 8–23)
Calcium, Ion: 1.01 mmol/L — ABNORMAL LOW (ref 1.15–1.40)
Chloride: 99 mmol/L (ref 98–111)
Creatinine, Ser: 0.6 mg/dL — ABNORMAL LOW (ref 0.61–1.24)
Glucose, Bld: 94 mg/dL (ref 70–99)
HCT: 49 % (ref 39.0–52.0)
Hemoglobin: 16.7 g/dL (ref 13.0–17.0)
Potassium: 4 mmol/L (ref 3.5–5.1)
Sodium: 129 mmol/L — ABNORMAL LOW (ref 135–145)
TCO2: 22 mmol/L (ref 22–32)

## 2020-03-22 LAB — ABO/RH: ABO/RH(D): A NEG

## 2020-03-22 SURGERY — BRONCHOSCOPY, WITH EBUS
Anesthesia: General

## 2020-03-22 SURGERY — CANCELLED PROCEDURE
Anesthesia: General | Site: Chest | Laterality: Right

## 2020-03-22 MED ORDER — CHLORHEXIDINE GLUCONATE 0.12 % MT SOLN: 15.0000 mL | Freq: Once | OROMUCOSAL | Status: AC

## 2020-03-22 MED ORDER — PHENYLEPHRINE 40 MCG/ML (10ML) SYRINGE FOR IV PUSH (FOR BLOOD PRESSURE SUPPORT)
PREFILLED_SYRINGE | INTRAVENOUS | Status: DC | PRN
  Administered 2020-03-22 (×2): 200 ug via INTRAVENOUS

## 2020-03-22 MED ORDER — LIDOCAINE 2% (20 MG/ML) 5 ML SYRINGE
INTRAMUSCULAR | Status: DC | PRN
  Administered 2020-03-22: 100 mg via INTRAVENOUS

## 2020-03-22 MED ORDER — PROPOFOL 10 MG/ML IV BOLUS
INTRAVENOUS | Status: AC
  Filled 2020-03-22: qty 20

## 2020-03-22 MED ORDER — EPHEDRINE SULFATE-NACL 50-0.9 MG/10ML-% IV SOSY
PREFILLED_SYRINGE | INTRAVENOUS | Status: DC | PRN
  Administered 2020-03-22: 10 mg via INTRAVENOUS

## 2020-03-22 MED ORDER — PROPOFOL 10 MG/ML IV BOLUS
INTRAVENOUS | Status: DC | PRN
  Administered 2020-03-22: 50 mg via INTRAVENOUS
  Administered 2020-03-22: 130 mg via INTRAVENOUS

## 2020-03-22 MED ORDER — PHENYLEPHRINE HCL-NACL 10-0.9 MG/250ML-% IV SOLN
INTRAVENOUS | Status: DC | PRN
  Administered 2020-03-22: 50 ug/min via INTRAVENOUS

## 2020-03-22 MED ORDER — CEFAZOLIN SODIUM-DEXTROSE 2-4 GM/100ML-% IV SOLN
INTRAVENOUS | Status: AC
  Filled 2020-03-22: qty 100

## 2020-03-22 MED ORDER — LIDOCAINE HCL (PF) 2 % IJ SOLN
INTRAMUSCULAR | Status: AC
  Filled 2020-03-22: qty 5

## 2020-03-22 MED ORDER — ROCURONIUM BROMIDE 10 MG/ML (PF) SYRINGE
PREFILLED_SYRINGE | INTRAVENOUS | Status: DC | PRN
  Administered 2020-03-22: 30 mg via INTRAVENOUS
  Administered 2020-03-22: 50 mg via INTRAVENOUS

## 2020-03-22 MED ORDER — CHLORHEXIDINE GLUCONATE 0.12 % MT SOLN
OROMUCOSAL | Status: AC
  Administered 2020-03-22: 15 mL via OROMUCOSAL
  Filled 2020-03-22: qty 15

## 2020-03-22 MED ORDER — FENTANYL CITRATE (PF) 250 MCG/5ML IJ SOLN
INTRAMUSCULAR | Status: DC | PRN
  Administered 2020-03-22: 50 ug via INTRAVENOUS

## 2020-03-22 MED ORDER — LACTATED RINGERS IV SOLN: INTRAVENOUS | Status: DC

## 2020-03-22 MED ORDER — DEXAMETHASONE SODIUM PHOSPHATE 10 MG/ML IJ SOLN
INTRAMUSCULAR | Status: DC | PRN
  Administered 2020-03-22: 10 mg via INTRAVENOUS

## 2020-03-22 MED ORDER — BUPIVACAINE HCL (PF) 0.5 % IJ SOLN
INTRAMUSCULAR | Status: AC
  Filled 2020-03-22: qty 30

## 2020-03-22 MED ORDER — ORAL CARE MOUTH RINSE: 15.0000 mL | Freq: Once | OROMUCOSAL | Status: AC

## 2020-03-22 MED ORDER — ONDANSETRON HCL 4 MG/2ML IJ SOLN
INTRAMUSCULAR | Status: DC | PRN
  Administered 2020-03-22: 4 mg via INTRAVENOUS

## 2020-03-22 MED ORDER — BUPIVACAINE LIPOSOME 1.3 % IJ SUSP
20.0000 mL | Freq: Once | INTRAMUSCULAR | Status: DC
  Filled 2020-03-22: qty 20

## 2020-03-22 MED ORDER — MIDAZOLAM HCL 2 MG/2ML IJ SOLN
INTRAMUSCULAR | Status: DC | PRN
  Administered 2020-03-22: 2 mg via INTRAVENOUS

## 2020-03-22 MED ORDER — SUGAMMADEX SODIUM 200 MG/2ML IV SOLN
INTRAVENOUS | Status: DC | PRN
  Administered 2020-03-22: 200 mg via INTRAVENOUS

## 2020-03-22 MED ORDER — CEFAZOLIN SODIUM-DEXTROSE 2-4 GM/100ML-% IV SOLN: 2.0000 g | INTRAVENOUS | Status: DC

## 2020-03-22 SURGICAL SUPPLY — 51 items
ADAPTER BRONCH F/PENTAX (ADAPTER) ×3 IMPLANT
ADAPTER VALVE BIOPSY EBUS (MISCELLANEOUS) IMPLANT
ADPTR VALVE BIOPSY EBUS (MISCELLANEOUS)
BRUSH CYTOL CELLEBRITY 1.5X140 (MISCELLANEOUS) ×3 IMPLANT
BRUSH SUPERTRAX BIOPSY (INSTRUMENTS) IMPLANT
BRUSH SUPERTRAX NDL-TIP CYTO (INSTRUMENTS) ×3 IMPLANT
CANISTER SUCT 3000ML PPV (MISCELLANEOUS) ×3 IMPLANT
CHANNEL WORK EXTEND EDGE 180 (KITS) IMPLANT
CHANNEL WORK EXTEND EDGE 45 (KITS) IMPLANT
CHANNEL WORK EXTEND EDGE 90 (KITS) IMPLANT
CONT SPEC 4OZ CLIKSEAL STRL BL (MISCELLANEOUS) ×3 IMPLANT
COVER BACK TABLE 60X90IN (DRAPES) ×3 IMPLANT
COVER DOME SNAP 22 D (MISCELLANEOUS) ×3 IMPLANT
FILTER STRAW FLUID ASPIR (MISCELLANEOUS) IMPLANT
FORCEPS BIOP RJ4 1.8 (CUTTING FORCEPS) IMPLANT
FORCEPS BIOP SUPERTRX PREMAR (INSTRUMENTS) ×3 IMPLANT
GAUZE SPONGE 4X4 12PLY STRL (GAUZE/BANDAGES/DRESSINGS) ×3 IMPLANT
GLOVE BIO SURGEON STRL SZ7.5 (GLOVE) ×3 IMPLANT
GLOVE SURG SS PI 7.5 STRL IVOR (GLOVE) ×6 IMPLANT
GOWN STRL REUS W/ TWL LRG LVL3 (GOWN DISPOSABLE) ×4 IMPLANT
GOWN STRL REUS W/TWL LRG LVL3 (GOWN DISPOSABLE) ×6
KIT CLEAN ENDO COMPLIANCE (KITS) ×6 IMPLANT
KIT LOCATABLE GUIDE (CANNULA) IMPLANT
KIT MARKER FIDUCIAL DELIVERY (KITS) IMPLANT
KIT PROCEDURE EDGE 180 (KITS) IMPLANT
KIT PROCEDURE EDGE 45 (KITS) IMPLANT
KIT PROCEDURE EDGE 90 (KITS) IMPLANT
KIT TURNOVER KIT B (KITS) ×3 IMPLANT
MARKER SKIN DUAL TIP RULER LAB (MISCELLANEOUS) ×3 IMPLANT
NEEDLE EBUS SONO TIP PENTAX (NEEDLE) ×3 IMPLANT
NEEDLE SUPERTRX PREMARK BIOPSY (NEEDLE) ×3 IMPLANT
NS IRRIG 1000ML POUR BTL (IV SOLUTION) ×3 IMPLANT
OIL SILICONE PENTAX (PARTS (SERVICE/REPAIRS)) ×3 IMPLANT
PAD ARMBOARD 7.5X6 YLW CONV (MISCELLANEOUS) ×6 IMPLANT
PATCHES PATIENT (LABEL) ×9 IMPLANT
SOL ANTI FOG 6CC (MISCELLANEOUS) ×2 IMPLANT
SOLUTION ANTI FOG 6CC (MISCELLANEOUS) ×1
SYR 20CC LL (SYRINGE) ×6 IMPLANT
SYR 20ML ECCENTRIC (SYRINGE) ×6 IMPLANT
SYR 50ML SLIP (SYRINGE) ×3 IMPLANT
SYR 5ML LUER SLIP (SYRINGE) ×3 IMPLANT
TOWEL OR 17X24 6PK STRL BLUE (TOWEL DISPOSABLE) ×3 IMPLANT
TRAP SPECIMEN MUCOUS 40CC (MISCELLANEOUS) IMPLANT
TUBE CONNECTING 20X1/4 (TUBING) ×6 IMPLANT
UNDERPAD 30X30 (UNDERPADS AND DIAPERS) ×3 IMPLANT
VALVE BIOPSY  SINGLE USE (MISCELLANEOUS) ×3
VALVE BIOPSY SINGLE USE (MISCELLANEOUS) ×2 IMPLANT
VALVE DISPOSABLE (MISCELLANEOUS) ×3 IMPLANT
VALVE SUCTION BRONCHIO DISP (MISCELLANEOUS) ×3 IMPLANT
WATER STERILE IRR 1000ML POUR (IV SOLUTION) ×3 IMPLANT
superlock fiducial marker ×6 IMPLANT

## 2020-03-22 NOTE — Anesthesia Postprocedure Evaluation (Signed)
Anesthesia Post Note  Patient: Ian Hart  Procedure(s) Performed: VIDEO BRONCHOSCOPY WITH ENDOBRONCHIAL ULTRASOUND (N/A ) VIDEO BRONCHOSCOPY WITH ENDOBRONCHIAL NAVIGATION (N/A ) BRONCHIAL NEEDLE ASPIRATION BIOPSIES BRONCHIAL BIOPSIES BRONCHIAL BRUSHINGS FIDUCIAL MARKER PLACEMENT BRONCHIAL WASHINGS CANCELLED PROCEDURE     Patient location during evaluation: PACU Anesthesia Type: General Level of consciousness: awake and alert Pain management: pain level controlled Vital Signs Assessment: post-procedure vital signs reviewed and stable Respiratory status: spontaneous breathing, nonlabored ventilation, respiratory function stable and patient connected to nasal cannula oxygen Cardiovascular status: blood pressure returned to baseline and stable Postop Assessment: no apparent nausea or vomiting Anesthetic complications: no   No complications documented.  Last Vitals:  Vitals:   03/22/20 0809 03/22/20 1317  BP: (!) 167/68 (!) 111/46  Pulse: 99 90  Resp: 18 13  Temp: 36.8 C (!) 36.4 C  SpO2: 100% 93%    Last Pain:  Vitals:   03/22/20 1317  TempSrc:   PainSc: [place]

## 2020-03-22 NOTE — Discharge Instructions (Signed)
Flexible Bronchoscopy, Care After This sheet gives you information about how to care for yourself after your test. Your doctor may also give you more specific instructions. If you have problems or questions, contact your doctor. Follow these instructions at home: Eating and drinking  The day after the test, go back to your normal diet. Driving  Do not drive for 24 hours if you were given a medicine to help you relax (sedative).  Do not drive or use heavy machinery while taking prescription pain medicine. General instructions   Take over-the-counter and prescription medicines only as told by your doctor.  Return to your normal activities as told. Ask what activities are safe for you.  Do not use any products that have nicotine or tobacco in them. This includes cigarettes and e-cigarettes. If you need help quitting, ask your doctor.  Keep all follow-up visits as told by your doctor. This is important. It is very important if you had a tissue sample (biopsy) taken. Get help right away if:  You have shortness of breath that gets worse.  You get light-headed.  You feel like you are going to pass out (faint).  You have chest pain.  You cough up: ? More than a little blood. ? More blood than before. Summary  Do not eat or drink anything (not even water) for 2 hours after your test, or until your numbing medicine wears off.  Do not use cigarettes. Do not use e-cigarettes.  Get help right away if you have chest pain. This information is not intended to replace advice given to you by your health care provider. Make sure you discuss any questions you have with your health care provider. Document Revised: 03/13/2017 Document Reviewed: 04/18/2016 Elsevier Patient Education  2020 [employer].

## 2020-03-22 NOTE — Interval H&P Note (Signed)
History and Physical Interval Note:  03/22/2020 10:07 AM  Ian Hart  has presented today for surgery, with the diagnosis of lung nodule.  The various methods of treatment have been discussed with the patient and family. After consideration of risks, benefits and other options for treatment, the patient has consented to  Procedure(s): VIDEO BRONCHOSCOPY WITH ENDOBRONCHIAL ULTRASOUND (N/A) VIDEO BRONCHOSCOPY WITH ENDOBRONCHIAL NAVIGATION (N/A) as a surgical intervention.  The patient's history has been reviewed, patient examined, no change in status, stable for surgery.  I have reviewed the patient's chart and labs.  Questions were answered to the patient's satisfaction.     [place]

## 2020-03-22 NOTE — H&P (Signed)
[address]       [place] [address]             [phone]       No changes since his clinic appointment Stress test was low risk  OR today for navigation bronchoscopy, and EBUS with Dr. Valeta Harms.  If negative for small cell, and N2 disease, will proceed with right RATS, RULectomy.  Per my clinic note  [facility] Record #[id number] Date of Birth: 1950-12-20  Referring: Garner Nash, DO Primary Care: Debbrah Alar, NP Primary Cardiologist: No primary care provider on file.  Chief Complaint:        Chief Complaint  Patient presents with  . Lung Lesion    Initial surgical consult, PET 11/18, ct chest 11/3, PFT 11/19    History of Present Illness:    Ian Hart 69 y.o. male referred for surgical evaluation of right upper lobe pulmonary nodule.  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  That scan ultimately found a pulmonary nodule, and he was subsequently referred to Dr. Valeta Harms for further evaluation.  He has since stopped smoking, and subsequently underwent a PET/CT which showed avidity in the pulmonary nodule as well as a subcarinal lymph node.  He denies any symptoms.  His weight has been stable for several years.  He denies any neurologic symptoms including headaches or vision changes.  He occasionally has some exertional dyspnea, was able to walk approximately 45miles this summer when visiting the [place].  Smoking Hx: Former smoker as of 2 weeks ago previously smoked 2 packs a day.   Zubrod Score: At the time of surgery this patient's most appropriate activity status/level should be described as: [x] ?    0    Normal activity, no symptoms [] ?    1    Restricted in physical strenuous activity but  ambulatory, able to do out light work [] ?    2    Ambulatory and capable of self care, unable to do work activities, up and about               >50 % of waking hours                              [] ?    3    Only limited self care, in bed greater than 50% of waking hours [] ?    4    Completely disabled, no self care, confined to bed or chair [] ?    5    Moribund       Past Medical History:  Diagnosis Date  . History of chicken pox   . History of shingles 04/2010         Past Surgical History:  Procedure Laterality Date  . NO PAST SURGERIES           Family History  Problem Relation Age of Onset  . Sudden death Mother   . Rheumatic fever Mother   . Hypertension Neg Hx   . Hyperlipidemia Neg Hx   . Heart attack Neg Hx   . Diabetes Neg Hx   . Colon cancer Neg Hx   . Stomach cancer Neg Hx  Social History       Tobacco Use  Smoking Status Current Every Day Smoker  . Packs/day: 2.00  . Types: Cigarettes  Smokeless Tobacco Never Used    Social History       Substance and Sexual Activity  Alcohol Use Yes  . Alcohol/week: 5.0 standard drinks  . Types: 3 Cans of beer, 2 Shots of liquor per week   Comment: daily          Allergies  Allergen Reactions  . Acyclovir And Related Rash    Rash that looked like chicken pox          Current Outpatient Medications  Medication Sig Dispense Refill  . aspirin EC 81 MG tablet Take 81 mg by mouth daily.    Marland Kitchen buPROPion (WELLBUTRIN XL) 150 MG 24 hr tablet Take 1 tablet (150 mg total) by mouth daily. 60 tablet 5  . Calcium Carbonate-Vitamin D (CALTRATE 600+D) 600-400 MG-UNIT per tablet Take 1 tablet by mouth 2 (two) times daily.    . Cyanocobalamin (VITAMIN B 12 PO) Take by mouth daily.    . meloxicam (MOBIC) 7.5 MG tablet TAKE 1 TABLET BY MOUTH DAILY 30 tablet 0  . Multiple Vitamin (MULTIVITAMIN) tablet Take 1 tablet by mouth daily.    Marland Kitchen umeclidinium-vilanterol (ANORO ELLIPTA) 62.5-25  MCG/INH AEPB Inhale 1 puff into the lungs daily. 2 each 0  . umeclidinium-vilanterol (ANORO ELLIPTA) 62.5-25 MCG/INH AEPB Inhale 1 puff into the lungs daily. 3 each 2   No current facility-administered medications for this visit.    Review of Systems  Constitutional: Negative.   Respiratory: Positive for shortness of breath.   Cardiovascular: Negative.   Gastrointestinal: Negative.   Neurological: Negative.      PHYSICAL EXAMINATION: BP (!) 143/77 (BP Location: Right Arm, Patient Position: Sitting)   Pulse 93   Resp 18   Ht 6\' 1"  (1.854 m)   Wt 160 lb (72.6 kg)   SpO2 98% Comment: RA with mask on  BMI 21.11 kg/m  Physical Exam Constitutional:      General: He is not in acute distress.    Appearance: Normal appearance. He is not ill-appearing.  HENT:     Head: Normocephalic and atraumatic.  Eyes:     Extraocular Movements: Extraocular movements intact.  Cardiovascular:     Rate and Rhythm: Normal rate.  Pulmonary:     Effort: Pulmonary effort is normal. No respiratory distress.  Abdominal:     General: Abdomen is flat. There is no distension.  Musculoskeletal:        General: Normal range of motion.     Cervical back: Normal range of motion.  Skin:    General: Skin is warm and dry.  Neurological:     General: No focal deficit present.     Mental Status: He is alert and oriented to person, place, and time.     Diagnostic Studies & Laboratory data:     Recent Radiology Findings:    Imaging Results  NM PET Image Initial (PI) Skull Base To Thigh  Result Date: 03/02/2020 CLINICAL DATA:  Initial treatment strategy for screening CT demonstrating a right upper lobe 14 mm pulmonary lung nodule. Ninety pack-year smoking history. Left arm COVID-19 vaccine 3/21. EXAM: NUCLEAR MEDICINE PET SKULL BASE TO THIGH TECHNIQUE: 8.2 mCi F-18 FDG was injected intravenously. Full-ring PET imaging was performed from the skull base to thigh after the radiotracer. CT data was  obtained and used for attenuation correction and anatomic localization.  Fasting blood glucose: 96 mg/dl COMPARISON:  Lung cancer screening CT 02/15/2020. FINDINGS: Mediastinal blood pool activity: SUV max 2.4 Liver activity: SUV max NA NECK: No areas of abnormal hypermetabolism. Incidental CT findings: No cervical adenopathy. Left carotid atherosclerosis. CHEST: Subcarinal node measures 9 mm and a S.U.V. max of 3.6 on 86/4. Spiculated posterior right upper lobe pulmonary nodule measures 1.3 x 1.0 cm and a S.U.V. max of 9.0 on 22/8. Incidental CT findings: Moderate centrilobular emphysema. Aortic atherosclerosis. ABDOMEN/PELVIS: No abdominopelvic nodal hypermetabolism. The posterior interpolar left renal soft tissue density lesion demonstrates hypermetabolism at a S.U.V. max of 4.4 on 130/4. Incidental CT findings: Normal adrenal glands. Abdominal aortic atherosclerosis. Extensive colonic diverticulosis. SKELETON: Hypermetabolism corresponding to an anterior right seventh rib healing or healed fracture on 103/4. No worrisome osseous lesion. Incidental CT findings: none IMPRESSION: 1. Hypermetabolic spiculated posterior right upper lobe pulmonary nodule, consistent with primary bronchogenic carcinoma. 2. Isolated hypermetabolic subcarinal node,moderately suspicious for nodal metastasis. 3. No evidence of extrathoracic hypermetabolic metastasis. 4. Left renal lesion which is indeterminate based on noncontrast CT characteristics, but hypermetabolic and suspicious for renal cell carcinoma. Consider dedicated pre and post contrast renal protocol abdominal CT or MRI. 5. Aortic atherosclerosis (ICD10-I70.0) and emphysema (ICD10-J43.9). Electronically Signed   By: Abigail Miyamoto M.D.   On: 03/02/2020 14:08   CT CHEST LUNG CA SCREEN LOW DOSE W/O CM  Result Date: 02/15/2020 CLINICAL DATA:  Lung cancer screening. 98 pack-year history. Current asymptomatic smoker. EXAM: CT CHEST WITHOUT CONTRAST LOW-DOSE FOR LUNG CANCER  SCREENING TECHNIQUE: Multidetector CT imaging of the chest was performed following the standard protocol without IV contrast. COMPARISON:  None. FINDINGS: Cardiovascular: The heart size appears within normal limits. No pericardial effusion. Aortic atherosclerosis. Coronary artery calcification. Mediastinum/Nodes: No enlarged mediastinal, hilar, or axillary lymph nodes. Thyroid gland, trachea, and esophagus demonstrate no significant findings. Lungs/Pleura: Advanced changes of paraseptal and centrilobular emphysema identified. Within the posteromedial right upper lobe there is a spiculated lesion which has an equivalent diameter of 13.6 mm. Two additional smaller nodules are identified within the right upper lobe measuring up to 5.3 mm. Focal branching density within the posteromedial right lower lobe is favored to represent an area of mucoid impaction within a dilated bronchial. Upper Abdomen: No acute abnormality. Hyperdense left kidney lesion is only partially visualized measuring 2 cm and 32 Hounsfield units. Incompletely characterized without IV contrast. Musculoskeletal: Spondylosis identified within the thoracic spine. Subacute to chronic right anterior rib fracture noted, image 210/4. IMPRESSION: 1. Lung-RADS 4A, suspicious. Follow up low-dose chest CT without contrast in 3 months (please use the following order, "CT CHEST LCS NODULE FOLLOW-UP /O CM") is recommended. Alternatively, PET may be considered when there is a solid component 85mm or larger. 2. Emphysema and aortic atherosclerosis. Aortic Atherosclerosis (ICD10-I70.0) and Emphysema (ICD10-J43.9). Electronically Signed   By: Kerby Moors M.D.   On: 02/15/2020 11:08        I have independently reviewed the above radiology studies  and reviewed the findings with the patient.   Recent Lab Findings: Recent Labs       Lab Results  Component Value Date   WBC 5.6 02/03/2014   HGB 15.2 02/03/2014   HCT 46.0 02/03/2014   PLT 252.0  02/03/2014   GLUCOSE 140 (H) 06/08/2014   CHOL 162 02/03/2014   TRIG 25.0 02/03/2014   HDL 92.10 02/03/2014   LDLCALC 65 02/03/2014   ALT 20 02/03/2014   AST 26 02/03/2014   NA 134 (L) 06/08/2014  K 4.6 06/08/2014   CL 99 06/08/2014   CREATININE 0.95 06/08/2014   BUN 17 06/08/2014   CO2 30 06/08/2014   TSH 1.29 02/03/2014   HGBA1C 5.6 06/16/2014       PFTs: - FVC: 95% - FEV1: 86% -DLCO: 68%  Problem List: 1.3 cm right upper lobe pulmonary nodule with an SUV of 9 9 mm subcarinal lymph node with an SUV of 3.6 Significant past smoking history with some evidence of emphysema.  Assessment / Plan:   This is a 69 year old gentleman with a 1.3 cm right upper lobe pulmonary nodule and a concerning subcarinal lymph node both of which were avid on PET/CT.  My concern is that this may represent N2 disease.  I discussed this case with Dr. Valeta Harms and we both agree that he will require a navigational bronchoscopy for tissue sample of the pulmonary nodule as well as an endobronchial ultrasound with sampling of the subcarinal lymph nodes.  The goal would be to rule out small cell lung cancer.  If he does have non-small cell lung cancer and a pulmonary nodule, and ensuring that he does not have N2 disease will be crucial as well.  We have discussed performing this at the same time as a potential right robotic assisted thoracoscopy with right upper lobectomy if the subcarinal lymph node is negative, and the pulmonary nodule is consistent with non-small cell lung cancer.  I discussed this at length with the patient and his wife and they both are in agreement.  He will require a stress test in the next few weeks.  And he is tentatively scheduled for March 22, 2020.

## 2020-03-22 NOTE — Transfer of Care (Signed)
Immediate Anesthesia Transfer of Care Note  Patient: OVILA LEPAGE  Procedure(s) Performed: VIDEO BRONCHOSCOPY WITH ENDOBRONCHIAL ULTRASOUND (N/A ) VIDEO BRONCHOSCOPY WITH ENDOBRONCHIAL NAVIGATION (N/A ) BRONCHIAL NEEDLE ASPIRATION BIOPSIES BRONCHIAL BIOPSIES BRONCHIAL BRUSHINGS FIDUCIAL MARKER PLACEMENT BRONCHIAL WASHINGS CANCELLED PROCEDURE  Patient Location: PACU  Anesthesia Type:General  Level of Consciousness: drowsy and patient cooperative  Airway & Oxygen Therapy: Patient Spontanous Breathing  Post-op Assessment: Report given to RN and Post -op Vital signs reviewed and stable  Post vital signs: Reviewed and stable  Last Vitals:  Vitals Value Taken Time  BP    Temp 36.4 C 03/22/20 1317  Pulse 86 03/22/20 1317  Resp 18 03/22/20 1317  SpO2 94 % 03/22/20 1317  Vitals shown include unvalidated device data.  Last Pain:  Vitals:   03/22/20 0940  TempSrc:   PainSc: 0-No pain      Patients Stated Pain Goal: 3 (98/06/99 9672)  Complications: No complications documented.

## 2020-03-22 NOTE — Anesthesia Procedure Notes (Signed)
Procedure Name: Intubation Date/Time: 03/22/2020 11:36 AM Performed by: Lance Coon, CRNA Pre-anesthesia Checklist: Patient identified, Emergency Drugs available, Suction available, Patient being monitored and Timeout performed Patient Re-evaluated:Patient Re-evaluated prior to induction Oxygen Delivery Method: Circle system utilized Preoxygenation: Pre-oxygenation with 100% oxygen Induction Type: IV induction Ventilation: Mask ventilation without difficulty Laryngoscope Size: Miller and 3 Grade View: Grade I Tube type: Oral Tube size: 8.5 mm Number of attempts: 1 Airway Equipment and Method: Stylet Placement Confirmation: ETT inserted through vocal cords under direct vision,  positive ETCO2 and breath sounds checked- equal and bilateral Secured at: 23 cm Tube secured with: Tape Dental Injury: Teeth and Oropharynx as per pre-operative assessment

## 2020-03-22 NOTE — Progress Notes (Signed)
STAT CT report call received. Dr. Valeta Harms already reviewing CT films.

## 2020-03-22 NOTE — Op Note (Signed)
Video Bronchoscopy with Endobronchial Ultrasound Procedure Note Video Bronchoscopy with Electromagnetic Navigation Procedure Note with fiducial placement  Date of Operation: 03/22/2020  Pre-op Diagnosis: Right upper lobe lung nodule, mediastinal adenopathy  Post-op Diagnosis: Right upper lobe lung nodule, mediastinal adenopathy  Surgeon: Garner Nash, DO   Assistants: None   Anesthesia: General endotracheal anesthesia  Operation: Flexible video fiberoptic bronchoscopy with electromagnetic navigation and biopsies.  Estimated Blood Loss: Minimal  Complications: None   Indications and History: Ian Hart is a 69 y.o. male with right upper lobe lung nodule, mediastinal adenopathy.  The risks, benefits, complications, treatment options and expected outcomes were discussed with the patient.  The possibilities of pneumothorax, pneumonia, reaction to medication, pulmonary aspiration, perforation of a viscus, bleeding, failure to diagnose a condition and creating a complication requiring transfusion or operation were discussed with the patient who freely signed the consent.    Description of Procedure: The patient was seen in the Preoperative Area, was examined and was deemed appropriate to proceed.  The patient was taken to [facility] endoscopy room 2, identified as Ian Hart and the procedure verified as Flexible Video Fiberoptic Bronchoscopy.  A Time Out was held and the above information confirmed.   After being taken to the operating room general anesthesia was initiated and the patient  was orally intubated. The video fiberoptic bronchoscope was introduced via the endotracheal tube and a general inspection was performed which showed normal right and left lung anatomy with no evidence of endobronchial lesion..   Target #1 station 7 lymph node: The standard scope was then withdrawn and the endobronchial ultrasound was used to identify and characterize the peritracheal, hilar and  bronchial lymph nodes. Inspection showed small 12 mm largest cross-section subcarinal station 7 lymph node, station 10 R 11 R, 4R, 10 L, 11L with no visible nodal structure. Using real-time ultrasound guidance Wang needle biopsies were take from Station 7 nodes and were sent for cytology. The patient tolerated the procedure well without apparent complications.   Target #2 right upper lobe posterior segment lung nodule: Prior to the date of the procedure a high-resolution CT scan of the chest was performed. Utilizing Ryder a virtual tracheobronchial tree was generated to allow the creation of distinct navigation pathways to the patient's parenchymal abnormalities. The extendable working channel and locator guide were introduced into the bronchoscope.  A full fluoroscopic sweep was obtained from RAO 30 degrees to LAO 20 degrees with an inspiratory breath-hold APL 20 cm of water.  This fluoroscopic sweep was used for local registration.  The distinct navigation pathways prepared prior to this procedure were then utilized to navigate to within 1.2 cm of patient's lesion(s) identified on CT scan. The extendable working channel was secured into place and the locator guide was withdrawn. Under fluoroscopic guidance transbronchial needle brushings, transbronchial Wang needle biopsies, and transbronchial forceps biopsies were performed to be sent for cytology and pathology.  Following specimen collection a fiducial delivery catheter was inserted into the airway and 2 fiducials were placed in axial planes and a approximately 1.5-2.0 cm from lesional center.  A bronchioalveolar lavage was performed in the right upper lobe posterior segment and sent for cytology. At the end of the procedure a general airway inspection was performed and there was no evidence of active bleeding.  The standard therapeutic bronchoscope was used for bilateral aspiration of both mainstem's and removal of secretions and blood  clots.  There was no evidence of active bleeding and all distal  subsegments were patent.  The bronchoscope was brought to just above the main carina there is no evidence of active bleeding.  The bronchoscope was removed.  The patient tolerated the procedure well. There was no significant blood loss and there were no obvious complications. A post-procedural chest x-ray is pending.  Samples target #1: Station 7 1. Wang needle biopsies from station 7 node  Samples target #2: Right upper lobe lung nodule 1. Transbronchial needle brushings from right upper lobe 2. Transbronchial Wang needle biopsies from right upper lobe 3. Transbronchial forceps biopsies from right upper lobe 4. Bronchoalveolar lavage from right upper lobe  Plans:  The patient will be discharged from the PACU to home when recovered from anesthesia and after chest x-ray is reviewed. We will review the cytology, pathology and microbiology results with the patient when they become available. Outpatient followup will be with Garner Nash, DO.   Garner Nash, DO [facility] 03/22/2020 1:16 PM

## 2020-03-22 NOTE — H&P (Signed)
Subjective:   PATIENT ID: Ian Hart GENDER: male DOB: December 13, 1950, MRN: [id number]  This is a 69 year old gentleman with a past medical history of tobacco abuse.  He states that he has quit as of this week after finding out the news of his lung cancer screening CT.  He used to work for [employer] at [address] in [employer].  He had a routine physical and was referred for a lung cancer screening CT.  This was completed on 02/15/2020.  It revealed a lung RADS 4 a suspicious lesion within the posterior medial right upper lobe spiculated at 13.6 mm concerning for a primary bronchogenic carcinoma.  Patient denies fevers chills night sweats weight loss or hemoptysis.  Of note his CT scan also reveals evidence of paraseptal and centrilobular emphysema.  Patient presents today for outpatient navigational bronchoscopy, endobronchial ultrasound with transbronchial needle aspiration of the subcarinal lymph node followed by potential robotic VATS by Dr. Kipp Brood.  Past Medical History:  Diagnosis Date  . History of chicken pox   . History of shingles 04/2010     Family History  Problem Relation Age of Onset  . Sudden death Mother   . Rheumatic fever Mother   . Hypertension Neg Hx   . Hyperlipidemia Neg Hx   . Heart attack Neg Hx   . Diabetes Neg Hx   . Colon cancer Neg Hx   . Stomach cancer Neg Hx      Past Surgical History:  Procedure Laterality Date  . NO PAST SURGERIES      Social History   Socioeconomic History  . Marital status: Married    Spouse name: Not on file  . Number of children: 1  . Years of education: Not on file  . Highest education level: Not on file  Occupational History  . Occupation: retired  Tobacco Use  . Smoking status: Current Every Day Smoker    Types: Cigarettes  . Smokeless tobacco: Never Used  . Tobacco comment: <10 cigarettes/day  Vaping Use  . Vaping Use: Never used  Substance and Sexual Activity  . Alcohol use: Yes    Alcohol/week:  5.0 standard drinks    Types: 3 Cans of beer, 2 Shots of liquor per week    Comment: daily  . Drug use: No  . Sexual activity: Yes  Other Topics Concern  . 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           Social Determinants of Health   Financial Resource Strain: Not on file  Food Insecurity: Not on file  Transportation Needs: Not on file  Physical Activity: Not on file  Stress: Not on file  Social Connections: Not on file  Intimate Partner Violence: Not on file     Allergies  Allergen Reactions  . Acyclovir And Related Rash    Rash that looked like chicken pox     @ENCMEDSTART @  Review of Systems  Constitutional: Negative for chills, fever, malaise/fatigue and weight loss.  HENT: Negative for hearing loss, sore throat and tinnitus.   Eyes: Negative for blurred vision and double vision.  Respiratory: Negative for cough, hemoptysis, sputum production, shortness of breath, wheezing and stridor.   Cardiovascular: Negative for chest  pain, palpitations, orthopnea, leg swelling and PND.  Gastrointestinal: Negative for abdominal pain, constipation, diarrhea, heartburn, nausea and vomiting.  Genitourinary: Negative for dysuria, hematuria and urgency.  Musculoskeletal: Negative for joint pain and myalgias.  Skin: Negative for itching and rash.  Neurological: Negative for dizziness, tingling, weakness and headaches.  Endo/Heme/Allergies: Negative for environmental allergies. Does not bruise/bleed easily.  Psychiatric/Behavioral: Negative for depression. The patient is nervous/anxious. The patient does not have insomnia.   All other systems reviewed and are negative.    Objective:  Physical Exam Vitals reviewed.  Constitutional:      General: He is not in acute distress.     Appearance: He is well-developed and well-nourished.  HENT:     Head: Normocephalic and atraumatic.     Mouth/Throat:     Mouth: Oropharynx is clear and moist.  Eyes:     General: No scleral icterus.    Conjunctiva/sclera: Conjunctivae normal.     Pupils: Pupils are equal, round, and reactive to light.  Neck:     Vascular: No JVD.     Trachea: No tracheal deviation.  Cardiovascular:     Rate and Rhythm: Normal rate and regular rhythm.     Pulses: Intact distal pulses.     Heart sounds: Normal heart sounds. No murmur heard.   Pulmonary:     Effort: Pulmonary effort is normal. No tachypnea, accessory muscle usage or respiratory distress.     Breath sounds: No stridor. No wheezing, rhonchi or rales.  Abdominal:     General: Bowel sounds are normal. There is no distension.     Palpations: Abdomen is soft.     Tenderness: There is no abdominal tenderness.  Musculoskeletal:        General: No tenderness or edema.     Cervical back: Neck supple.  Lymphadenopathy:     Cervical: No cervical adenopathy.  Skin:    General: Skin is warm and dry.     Capillary Refill: Capillary refill takes less than 2 seconds.     Findings: No rash.  Neurological:     Mental Status: He is alert and oriented to person, place, and time.  Psychiatric:        Mood and Affect: Mood and affect normal.        Behavior: Behavior normal.      Vitals:   03/22/20 0809  BP: (!) 167/68  Pulse: 99  Resp: 18  Temp: 98.2 F (36.8 C)  TempSrc: Oral  SpO2: 100%  Weight: 72.6 kg  Height: 6\' 1"  (1.854 m)   100% on RA BMI Readings from Last 3 Encounters:  03/22/20 21.11 kg/m  03/20/20 21.02 kg/m  03/14/20 21.11 kg/m   Wt Readings from Last 3 Encounters:  03/22/20 72.6 kg  03/20/20 72.3 kg  03/14/20 72.6 kg     CBC    Component Value Date/Time   WBC 7.1 03/20/2020 1301   RBC 5.28 03/20/2020 1301   HGB 16.7 03/22/2020 0958   HCT 49.0 03/22/2020 0958   PLT 218 03/20/2020 1301   MCV 93.2  03/20/2020 1301   MCH 31.1 03/20/2020 1301   MCHC 33.3 03/20/2020 1301   RDW 12.1 03/20/2020 1301   LYMPHSABS 1.0 02/03/2014 0806   MONOABS 0.7 02/03/2014 0806   EOSABS 0.2 02/03/2014 0806   BASOSABS 0.0 02/03/2014 0806     Chest Imaging: Super D CT imaging 03/22/2020: Lung nodule for bronchoscopy planning.  Reviewed. The patient's images have been independently reviewed by me.  Pulmonary Functions Testing Results: PFT Results Latest Ref Rng & Units 03/02/2020  FVC-Pre L 4.82  FVC-Predicted Pre % 95  FVC-Post L 4.72  FVC-Predicted Post % 93  Pre FEV1/FVC % % 68  Post FEV1/FCV % % 67  FEV1-Pre L 3.26  FEV1-Predicted Pre % 86  FEV1-Post L 3.19  DLCO uncorrected ml/min/mmHg 19.82  DLCO UNC% % 68  DLCO corrected ml/min/mmHg 19.82  DLCO COR %Predicted % 68  DLVA Predicted % 66  TLC L 8.09  TLC % Predicted % 104  RV % Predicted % 118       Assessment & Plan:     ICD-10-CM   1. Nodule of upper lobe of right lung  R91.1 Informed Consent Details: Physician/Practitioner Attestation; Transcribe to consent form and obtain patient signature    Initiate Pre-op Protocol    Diet NPO time specified    Pre-admission testing diagnosis    Verify informed consent    Verify: history and physical is on the chart    Verify: blood consent signed    Patient education (specify): Use anesthesia standing orders to instruct patient on medications to take pre-op    Patient education (specify): Incentive Spirometry instructions to VATS patients    Lab instructions    Confirm: 12 lead EKG completed withing one month of surgery. If not current obtain per standing orders    Confirm: PA and Lateral CXR completed within previous 72 hours.  Films obtained on Friday are acceptable for Monday and Tuesday cases. If not current obtain per standing orders    Notify TCTS office by 16:30 on the day prior to surgery for any abnormal lab values not previously acknowledged by TCTS    Pneumatic SCD boots to  accompany all patients to O.R.    Prep / Clip    ceFAZolin (ANCEF) IVPB 2g/100 mL premix    Informed Consent Details: Physician/Practitioner Attestation; Transcribe to consent form and obtain patient signature    Informed Consent Details: Physician/Practitioner Attestation; Transcribe to consent form and obtain patient signature    Initiate Pre-op Protocol    Pre-admission testing diagnosis    Verify informed consent    Verify: history and physical is on the chart    Verify: blood consent signed    Patient education (specify): Use anesthesia standing orders to instruct patient on medications to take pre-op    Patient education (specify): Incentive Spirometry instructions to VATS patients    Lab instructions    Confirm: 12 lead EKG completed withing one month of surgery. If not current obtain per standing orders    Confirm: PA and Lateral CXR completed within previous 72 hours.  Films obtained on Friday are acceptable for Monday and Tuesday cases. If not current obtain per standing orders    Notify TCTS office by 16:30 on the day prior to surgery for any abnormal lab values not previously acknowledged by TCTS    Pneumatic SCD boots to accompany all patients to O.R.    Prep / Clip    Informed Consent Details: Physician/Practitioner Attestation; Transcribe to consent form and obtain patient signature    Discussion: Patient presents today for outpatient navigational bronchoscopy, tissue sampling of nodule as well as endobronchial ultrasound with transbronchial needle aspiration biopsies of subcarinal lymph node.  Pending pathology report we will plan for transition to the OR for possible robotic lobectomy by Dr. Kipp Brood.   Garner Nash, DO [facility] 03/22/2020 10:01 AM

## 2020-03-23 ENCOUNTER — Encounter: Payer: Medicare Other | Admitting: Family

## 2020-03-24 LAB — CYTOLOGY - NON PAP

## 2020-03-25 ENCOUNTER — Encounter (HOSPITAL_COMMUNITY): Payer: Self-pay | Admitting: Pulmonary Disease

## 2020-03-28 ENCOUNTER — Telehealth: Payer: Self-pay | Admitting: Pulmonary Disease

## 2020-03-28 DIAGNOSIS — C801 Malignant (primary) neoplasm, unspecified: Secondary | ICD-10-CM

## 2020-03-28 LAB — CYTOLOGY - NON PAP

## 2020-03-28 NOTE — Telephone Encounter (Signed)
PCCM:  Called patient to discuss recent pathology results.  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  I discussed this with Dr. Kipp Brood as well.  Referrals placed to medical oncology.  Would like to discuss case in [place].  Garner Nash, DO [facility] 03/28/2020 1:46 PM

## 2020-03-28 NOTE — Telephone Encounter (Signed)
Hi Brad, Can you remind [name] tomorrow to discuss him at the end of the list. Thank you.

## 2020-03-29 ENCOUNTER — Telehealth: Payer: Self-pay | Admitting: Physician Assistant

## 2020-03-29 ENCOUNTER — Encounter: Payer: Self-pay | Admitting: *Deleted

## 2020-03-29 DIAGNOSIS — R911 Solitary pulmonary nodule: Secondary | ICD-10-CM

## 2020-03-29 NOTE — Progress Notes (Signed)
I received referral on Mr. Ian Hart.  I updated new patient coordinator to call and schedule him to be seen on 04/09/20 with Cassie.

## 2020-03-29 NOTE — Telephone Encounter (Signed)
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 Mr. Tinkham has been cld and scheduled to see Cassie on 12/21 at 9am w/labs at 830am. Pt aware to arrive 15 minutes early.

## 2020-04-02 NOTE — Progress Notes (Signed)
Thoracic Location of Tumor / Histology:  Right Upper Lobe Lung  Patient presented for lung cancer screening CT scan.  CT Super D Chest 03/22/2020: Spiculated RIGHT upper lobe nodule measuring 1.4 x 1.2 cm. As on recent CT of the chest and PET evaluation compatible with bronchogenic neoplasm.  Scattered areas of nodularity along the pleural surface in the RIGHT chest more suggestive of pleural and parenchymal scarring.  Scarring or developing subtle nodular focus in the RIGHT lower lobe measuring approximately 5 mm there appear to be inspissated areas within bronchial structures in this area.  Small lesion arising from the lower pole the LEFT kidney stable approximately 2.4 cm greatest axial dimension, density values slightly above 30 Hounsfield units. This may represent a hemorrhagic cyst but is incompletely characterized,  Bronchoscopy with endobronchial ultrasound  PET 14/43/1540: Hypermetabolic spiculated posterior right upper lobe pulmonary nodule, consistent with primary bronchogenic carcinoma.  2. Isolated hypermetabolic subcarinal node, moderately suspicious for nodal metastasis. No evidence of extrathoracic hypermetabolic metastasis. Left renal lesion which is indeterminate based on noncontrast CT characteristics, but hypermetabolic and suspicious for renal cell carcinoma. Consider dedicated pre and post contrast renal protocol abdominal CT or MRI.  CT Chest 02/15/2020: Within the posteromedial right upper lobe there is a spiculated lesion which has an equivalent diameter of 13.6 mm. Two additional smaller nodules are identified within the right upper lobe measuring up to 5.3 mm. Focal branching density within the posteromedial right lower lobe is favored to represent an area of mucoid impaction within a dilated bronchial.  Lung-RADS 4A, suspicious. Follow up low-dose chest CT without contrast in 3 months  Biopsies of RUL Lung/ Lymph Node 03/22/2020      Tobacco/Marijuana/Snuff/ETOH use:  Current smoker, trying to quit, using nicotine patches and wellbutrin.  Past/Anticipated interventions by pulmonary, if any: Dr. Valeta Harms 03/22/2020 -Patient presents today for outpatient navigational bronchoscopy, tissue sampling of nodule as well as endobronchial ultrasound with transbronchial needle aspiration biopsies of subcarinal lymph node. -Pending pathology report we will plan for transition to the OR for possible robotic lobectomy by Dr. Kipp Brood.  Past/Anticipated interventions by cardiothoracic surgery, if any:  Dr. Kipp Brood 03/22/2020 -OR today for navigation bronchoscopy, and EBUS with Dr. Valeta Harms.  If negative for small cell, and N2 disease, will proceed with right RATS, RULectomy. -I discussed this case with Dr. Valeta Harms and we both agree that he will require a navigational bronchoscopy for tissue sample of the pulmonary nodule as well as an endobronchial ultrasound with sampling of the subcarinal lymph nodes. The goal would be to rule out small cell lung cancer. If he does have non-small cell lung cancer and a pulmonary nodule, and ensuring that he does not have N2 disease will be crucial as well.  Past/Anticipated interventions by medical oncology, if any:  PA Cassie 04/09/2020 9 am   Signs/Symptoms  Weight changes, if any: Stable   Respiratory complaints, if any: No  Hemoptysis, if any: Occasional non-productive cough in the morning.  No hemoptysis.  Pain issues, if any: No    SAFETY ISSUES:  Prior radiation? No  Pacemaker/ICD? No  Possible current pregnancy? n/a  Is the patient on methotrexate? No  Current Complaints / other details:

## 2020-04-03 ENCOUNTER — Ambulatory Visit
Admission: RE | Admit: 2020-04-03 | Discharge: 2020-04-03 | Disposition: A | Discharge status: Home / Self Care | Payer: Medicare Other | Source: Ambulatory Visit | Attending: Radiation Oncology | Admitting: Radiation Oncology

## 2020-04-03 ENCOUNTER — Telehealth: Payer: Self-pay | Admitting: *Deleted

## 2020-04-03 ENCOUNTER — Encounter: Payer: Self-pay | Admitting: Radiation Oncology

## 2020-04-03 ENCOUNTER — Encounter: Payer: Self-pay | Admitting: *Deleted

## 2020-04-03 VITALS — Ht 73.0 in | Wt 163.0 lb

## 2020-04-03 DIAGNOSIS — C3490 Malignant neoplasm of unspecified part of unspecified bronchus or lung: Secondary | ICD-10-CM

## 2020-04-03 DIAGNOSIS — C3411 Malignant neoplasm of upper lobe, right bronchus or lung: Secondary | ICD-10-CM

## 2020-04-03 DIAGNOSIS — F17210 Nicotine dependence, cigarettes, uncomplicated: Secondary | ICD-10-CM | POA: Diagnosis not present

## 2020-04-03 NOTE — Progress Notes (Signed)
[place] Psychosocial Distress Screening Clinical Social Work  Clinical Social Work was referred by distress screening protocol.  The patient scored a 9 on the Psychosocial Distress Thermometer which indicates severe distress. Clinical Social Worker contacted patient by phone to assess for distress and other psychosocial needs.  Patient stated he was feeling overwhelmed with his diagnosis and facing his mortality.  Patient identified his wife and son (70 years old) as his primary concerns, and wanting to make sure they had the support they needed.  CSW and patient discussed common feelings and emotions when being diagnosed with cancer and the importance of support.  CSW provided education on CSW role and support services at [facility].  Patient expressed interest in support programs and requested to be added to the support program mailing list.  Patient plans to contact CSW to schedule a time to meet once he begins treatment.  CSW provided contact information and encouraged patient to call with additional questions or concerns.           ONCBCN DISTRESS SCREENING 04/03/2020  Screening Type Initial Screening  Distress experienced in past week (1-10) 9  Other Contact via phone    Johnnye Lana, MSW, LCSW, OSW-C Clinical Social Worker [place] [phone]

## 2020-04-03 NOTE — Progress Notes (Signed)
Radiation Oncology         ([phone]) [phone] ________________________________  Initial Outpatient Consultation - Conducted via telephone due to current COVID-19 concerns for limiting patient exposure  I spoke with the patient to conduct this consult visit via telephone to spare the patient unnecessary potential exposure in the healthcare setting during the current COVID-19 pandemic. The patient was notified in advance and was offered a [facility] meeting to allow for face to face communication but unfortunately reported that they did not have the appropriate resources/technology to support such a visit and instead preferred to proceed with a telephone consult.    Name: Ian Hart        MRN: [id number]  Date of Service: 04/03/2020 DOB: Dec 04, 1950  CC:O'Sullivan, Lenna Sciara, NP  Icard, Octavio Graves, DO     REFERRING PHYSICIAN: Garner Nash, DO   DIAGNOSIS: The primary encounter diagnosis was Malignant neoplasm of right upper lobe of lung ([place]). A diagnosis of Malignant neoplasm of unspecified part of unspecified bronchus or lung ([place]) was also pertinent to this visit.   HISTORY OF PRESENT ILLNESS: Ian Hart is a 69 y.o. male seen at the request of Dr. Valeta Harms for a newly diagnosed lung cancer.  The patient was referred in November 2021 Dr. Valeta Harms in pulmonary medicine for an abnormality on screening CT scan.  The patient had the scan on 02/15/2020 revealing a spiculated 13.6 mm nodule in the right upper lobe, and he subsequently underwent a PET scan on 03/01/2020 which revealed hypermetabolism within the right upper lobe nodule as well as an isolated hypermetabolic subcarinal lymph nodes suspicious for nodal disease.  No other extrathoracic hypermetabolic metastases were identified, a left renal lesion which was indeterminate but hypermetabolic was also seen and possibly suspicious for renal cell carcinoma.  He underwent a CT super D scan on 03/22/2020 and subsequent bronchoscopy.  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  He is getting set up to be seen by medical oncology and has an appointment to talk with Dr. Julien Nordmann on Monday of next week.  He is contacted today by phone to discuss the options of treatment with radiotherapy.   PREVIOUS RADIATION THERAPY: No   PAST MEDICAL HISTORY:  Past Medical History:  Diagnosis Date  . History of chicken pox   . History of shingles 04/2010  . Lung cancer ([place]) 02/2020       PAST SURGICAL HISTORY: Past Surgical History:  Procedure Laterality Date  . BRONCHIAL BIOPSY  03/22/2020   Procedure: BRONCHIAL BIOPSIES;  Surgeon: Garner Nash, DO;  Location: [facility];  Service: Pulmonary;;  . BRONCHIAL BRUSHINGS  03/22/2020   Procedure: BRONCHIAL BRUSHINGS;  Surgeon: Garner Nash, DO;  Location: [facility];  Service: Pulmonary;;  . BRONCHIAL NEEDLE ASPIRATION BIOPSY  03/22/2020   Procedure: BRONCHIAL NEEDLE ASPIRATION BIOPSIES;  Surgeon: Garner Nash, DO;  Location: [facility];  Service: Pulmonary;;  . BRONCHIAL WASHINGS  03/22/2020   Procedure: BRONCHIAL WASHINGS;  Surgeon: Garner Nash, DO;  Location: [facility];  Service: Pulmonary;;  . FIDUCIAL MARKER PLACEMENT  03/22/2020   Procedure: FIDUCIAL MARKER PLACEMENT;  Surgeon: Garner Nash, DO;  Location: [facility];  Service: Pulmonary;;  . NO PAST SURGERIES    . VIDEO BRONCHOSCOPY WITH ENDOBRONCHIAL NAVIGATION N/A 03/22/2020   Procedure: VIDEO BRONCHOSCOPY WITH ENDOBRONCHIAL NAVIGATION;  Surgeon: Garner Nash, DO;  Location: [facility];  Service: Pulmonary;  Laterality: N/A;  .  VIDEO BRONCHOSCOPY WITH ENDOBRONCHIAL ULTRASOUND N/A 03/22/2020   Procedure: VIDEO BRONCHOSCOPY WITH ENDOBRONCHIAL ULTRASOUND;  Surgeon: Garner Nash, DO;  Location: [facility];  Service: Pulmonary;  Laterality: N/A;     FAMILY HISTORY:  Family History  Problem Relation Age of Onset  .  Sudden death Mother   . Rheumatic fever Mother   . Hypertension Neg Hx   . Hyperlipidemia Neg Hx   . Heart attack Neg Hx   . Diabetes Neg Hx   . Colon cancer Neg Hx   . Stomach cancer Neg Hx      SOCIAL HISTORY:  reports that he has been smoking cigarettes. He has never used smokeless tobacco. He reports current alcohol use of about 5.0 standard drinks of alcohol per week. He reports that he does not use drugs.   ALLERGIES: Acyclovir and related   MEDICATIONS:  Current Outpatient Medications  Medication Sig Dispense Refill  . ascorbic acid (VITAMIN C) 500 MG tablet Take 500 mg by mouth daily.    Marland Kitchen aspirin EC 81 MG tablet Take 81 mg by mouth daily.    Marland Kitchen buPROPion (WELLBUTRIN XL) 150 MG 24 hr tablet Take 1 tablet (150 mg total) by mouth daily. 60 tablet 5  . Multiple Vitamin (MULTIVITAMIN) tablet Take 1 tablet by mouth daily.    . nicotine (NICODERM CQ - DOSED IN MG/24 HOURS) 21 mg/24hr patch Place 21 mg onto the skin daily.    . vitamin B-12 (CYANOCOBALAMIN) 1000 MCG tablet Take 1,000 mcg by mouth daily.    Marland Kitchen umeclidinium-vilanterol (ANORO ELLIPTA) 62.5-25 MCG/INH AEPB Inhale 1 puff into the lungs daily. (Patient not taking: No sig reported) 3 each 2   No current facility-administered medications for this encounter.     REVIEW OF SYSTEMS: On review of systems, the patient reports that he is doing well overall. He denies any concerns with breathing at this time. He has occasional non productive cough in the morning and denies hemoptysis. He denies any weight loss, shortness of breath, chest pain, or pain in other areas of the body. No other complaints are verbalized.     PHYSICAL EXAM:  Wt Readings from Last 3 Encounters:  04/03/20 163 lb (73.9 kg)  03/22/20 160 lb (72.6 kg)  03/20/20 159 lb 4.8 oz (72.3 kg)    Pain Assessment Pain Score: 0-No pain/10  Unable to assess due to encounter type.   ECOG = 1  0 - Asymptomatic (Fully active, able to carry on all predisease  activities without restriction)  1 - Symptomatic but completely ambulatory (Restricted in physically strenuous activity but ambulatory and able to carry out work of a light or sedentary nature. For example, light housework, office work)  2 - Symptomatic, <50% in bed during the day (Ambulatory and capable of all self care but unable to carry out any work activities. Up and about more than 50% of waking hours)  3 - Symptomatic, >50% in bed, but not bedbound (Capable of only limited self-care, confined to bed or chair 50% or more of waking hours)  4 - Bedbound (Completely disabled. Cannot carry on any self-care. Totally confined to bed or chair)  5 - Death   Eustace Pen MM, Creech RH, Tormey DC, et al. [phone]). "Toxicity and response criteria of the [facility]". [place] Oncol. 5 (6): 649-55    LABORATORY DATA:  Lab Results  Component Value Date   WBC 7.1 03/20/2020   HGB 16.7 03/22/2020   HCT 49.0 03/22/2020  MCV 93.2 03/20/2020   PLT 218 03/20/2020   Lab Results  Component Value Date   NA 129 (L) 03/22/2020   K 4.0 03/22/2020   CL 99 03/22/2020   CO2 19 (L) 03/20/2020   Lab Results  Component Value Date   ALT 19 03/20/2020   AST 20 03/20/2020   ALKPHOS 62 03/20/2020   BILITOT 0.5 03/20/2020      RADIOGRAPHY: DG Chest 2 View  Result Date: 03/21/2020 CLINICAL DATA:  Pulmonary nodules for biopsy.  No acute complaints. EXAM: CHEST - 2 VIEW COMPARISON:  Radiographs 06/08/2014. CT 02/15/2020. PET-CT 03/01/2020. FINDINGS: The heart size and mediastinal contours are stable without definite adenopathy. The lungs are hyperinflated consistent with chronic obstructive pulmonary disease. The known spiculated right upper lobe nodule is not well seen radiographically, overlapping the thoracic spine on the lateral view. No new or enlarging nodules, airspace disease, pleural effusion or pneumothorax identified. IMPRESSION: No acute cardiopulmonary process. The known  right upper lobe nodule is not well seen radiographically. Electronically Signed   By: Richardean Sale M.D.   On: 03/21/2020 15:29   DG CHEST PORT 1 VIEW  Result Date: 03/22/2020 CLINICAL DATA:  Status post bronchoscopy with biopsy. EXAM: PORTABLE CHEST 1 VIEW COMPARISON:  CT 03/22/2020.  Chest x-ray 03/20/2020. FINDINGS: Mediastinum and hilar structures normal. Heart size normal. Surgical clips right upper lobe. Diffuse infiltrate right upper lung. No pleural effusion or pneumothorax. Prominent skin fold noted on the right. Degenerative change in scoliosis thoracic spine. IMPRESSION: 1. Diffuse infiltrate right upper lung. No pneumothorax. 2. Surgical clips right upper lobe. Electronically Signed   By: Marcello Moores  Register   On: 03/22/2020 14:12   Myocardial Perfusion Imaging  Result Date: 03/14/2020  The left ventricular ejection fraction is mildly decreased (45-54%).  Nuclear stress EF: 53%. No wall motion abnormalities noted. Apical thinning noted.  There was no ST segment deviation noted during stress.  This is a low risk study. No signs of ischemia.  Candee Furbish, MD  CT Super D Chest Wo Contrast  Result Date: 03/22/2020 CLINICAL DATA:  Surgical planning in a 69 year old male with upcoming RIGHT upper lobectomy EXAM: CT CHEST WITHOUT CONTRAST TECHNIQUE: Multidetector CT imaging of the chest was performed using thin slice collimation for electromagnetic bronchoscopy planning purposes, without intravenous contrast. COMPARISON:  February 15, 2020 FINDINGS: Cardiovascular: Mild aortic atherosclerosis. No aneurysmal dilation of the thoracic aorta. Central pulmonary vasculature is of normal caliber. Coronary artery calcification, densely calcified coronary vasculature in LEFT coronary circulation. Heart size normal without pericardial effusion. Limited assessment of cardiovascular structures given lack of intravenous contrast. Mediastinum/Nodes: Thoracic inlet structures are normal. No axillary  lymphadenopathy no mediastinal lymphadenopathy no hilar lymphadenopathy Lungs/Pleura: Spiculated RIGHT upper lobe nodule (image 47, series 5) 1.4 x 1.2 cm. Small nodules elsewhere along the pleural surface in the RIGHT upper lobe most of these areas appear to represent pleural-parenchymal scarring some displaying signs of calcification. Small noncalcified nodule along the pleural surface posteriorly 2 mm (image 43, series 5) signs of paraseptal and centrilobular emphysema as on the recent comparison evaluation. Scarring or developing subtle nodular focus in the RIGHT lower lobe measuring approximately 5 mm (image 96, series 5) there appear to be inspissated areas within bronchial structures in this area. Upper Abdomen: Imaged portions of liver, spleen, pancreas, kidneys gastrointestinal tract without acute process. Small lesion arising from the lower pole the LEFT kidney stable approximately 2.4 cm greatest axial dimension, density values slightly above 30 Hounsfield units. Adrenal glands are normal.  Musculoskeletal: No acute bone finding. No destructive bone process. Spinal degenerative changes. IMPRESSION: 1. Spiculated RIGHT upper lobe nodule measuring 1.4 x 1.2 cm. As on recent CT of the chest and PET evaluation compatible with bronchogenic neoplasm. 2. Scattered areas of nodularity along the pleural surface in the RIGHT chest more suggestive of pleural and parenchymal scarring. 3. Scarring or developing subtle nodular focus in the RIGHT lower lobe measuring approximately 5 mm there appear to be inspissated areas within bronchial structures in this area. Favor post infectious changes, attention on follow-up. 4. Small lesion arising from the lower pole the LEFT kidney stable approximately 2.4 cm greatest axial dimension, density values slightly above 30 Hounsfield units. This may represent a hemorrhagic cyst but is incompletely characterized, dedicated renal imaging on follow-up with either ultrasound as initial  assessment or pre and post-contrast MRI. 5. Coronary artery calcification 6. Emphysema and aortic atherosclerosis. Aortic Atherosclerosis (ICD10-I70.0) and Emphysema (ICD10-J43.9). Electronically Signed   By: Zetta Bills M.D.   On: 03/22/2020 09:26   DG C-ARM BRONCHOSCOPY  Result Date: 03/22/2020 C-ARM BRONCHOSCOPY: Fluoroscopy was utilized by the requesting physician.  No radiographic interpretation.       IMPRESSION/PLAN: 1. 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 Dr. Lisbeth Renshaw discusses the pathology findings and reviews the nature of lung cancers and outlines the different histology types and how they're treated. Dr. Lisbeth Renshaw recommends completing staging work upw with an MRI of the brain. If this is negative for disease, Dr. Lisbeth Renshaw anticipates a role for chemoRT  We discussed the risks, benefits, short, and long term effects of radiotherapy, as well as the curative intent, and the patient is interested in proceeding. Dr. Lisbeth Renshaw discusses the delivery and logistics of radiotherapy and anticipates a course of 6 1/2 weeks of radiotherapy. We will follow up with Dr. Worthy Flank treatment recommendations. The patient will simulate on Thursday this week with intentions of starting treatment on 04/16/20. 2. Left renal lesion. We will follow along with Dr. Worthy Flank recommendations for further work up of this as well as treatment.  3. PCI. The patient is aware about the role for prophylactic cranial irradiation as well at the appropriate interval. We will discuss this further in the future.    Given current concerns for patient exposure during the COVID-19 pandemic, this encounter was conducted via telephone.  The patient has provided two factor identification and has given verbal consent for this type of encounter and has been advised to only accept a meeting of this type in a secure network environment. The time spent during this encounter  was 60 minutes including preparation, discussion, and coordination of the patient's care. The attendants for this meeting include Blenda Nicely, RN, Dr. Lisbeth Renshaw, Hayden Pedro  and Ian Hart and his wife Ian Hart.  During the encounter,  Blenda Nicely, RN, Dr. Lisbeth Renshaw, and Hayden Pedro were located at [facility] Radiation Oncology Department.  Ian Hart was located at home with his wife Ian Hart.    The above documentation reflects my direct findings during this shared patient visit. Please see the separate note by Dr. Lisbeth Renshaw on this date for the remainder of the patient's plan of care.    Carola Rhine, PAC

## 2020-04-03 NOTE — Telephone Encounter (Signed)
Called patient to inform of MRI for 04-08-20 - arrival time- 4:30 pm @ [facility], patient should not wear any metal, spoke with patient and he is aware of this test

## 2020-04-05 ENCOUNTER — Other Ambulatory Visit: Payer: Self-pay

## 2020-04-05 ENCOUNTER — Ambulatory Visit
Admission: RE | Admit: 2020-04-05 | Discharge: 2020-04-05 | Disposition: A | Discharge status: Home / Self Care | Payer: BC Managed Care – PPO | Source: Ambulatory Visit | Attending: Radiation Oncology | Admitting: Radiation Oncology

## 2020-04-05 DIAGNOSIS — Z510 Encounter for antineoplastic radiation therapy: Secondary | ICD-10-CM | POA: Insufficient documentation

## 2020-04-05 DIAGNOSIS — F17210 Nicotine dependence, cigarettes, uncomplicated: Secondary | ICD-10-CM | POA: Diagnosis not present

## 2020-04-05 DIAGNOSIS — C3411 Malignant neoplasm of upper lobe, right bronchus or lung: Secondary | ICD-10-CM | POA: Diagnosis not present

## 2020-04-07 NOTE — Progress Notes (Signed)
[place] [phone]   [phone]  CONSULT NOTE  REFERRING PHYSICIAN: Dr. 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  HPI Ian Hart is a 69 y.o. male with a past medical history significant for COPD, depression, and osteoporosis is referred to the clinic for evaluation of newly diagnosed lung cancer.  The patient's evaluation began 02/15/2020 when he underwent a low-dose CT screening for lung cancer.  The scan noted a spiculated 13.6 mm nodule in the right upper lobe.  The scan also noted a hyperdense left kidney lesion. The patient then the patient then saw Dr. Valeta Harms from pulmonology who ordered a PET scan on 03/01/2020 which noted hypermetabolism was in a right upper lobe lung nodule as well as isolated hypermetabolism in the subcarinal lymph node which is suspicious for nodal metastasis. Interestingly, left renal lesion was hypermetabolic on PET scan suspicious for renal cell carcinoma.  The patient saw cardiothoracic surgeon, Dr. Kipp Brood 03/05/2020. He underwent a bronchoscopy on 03/22/20.  The pathology of the RUL nodule was consistent with poorly differentiated carcinoma, most consistent with non-small cell lung cancer, Adenocarcinoma.  Interestingly, the patient station 7 lymph node was positive for small cell lung cancer.  The patient completed his staging work-up with a brain MRI on 04/08/2020 which was negative for metastatic disease to the brain. The patient met with radiation oncology who are planning on performing chemo RT with the intent of starting on 04/17/2019.  Overall, the patient is stressed with the recent diagnosis.  He denies any fever, chills, night sweats, or weight loss.  He denies any significant chest pain, hemoptysis, or dyspnea on exertion.  He reports a dry cough which  produces clear sputum.  He denies any nausea, vomiting, diarrhea, or constipation.  He denies any headache or visual changes.  The patient's family history consists of a mother who passed away secondary to a heart condition when he was 69 years old.  The patient's father passed away at the age of 48 due to old age.  The patient's brother passed away last year secondary to COVID-19.  The patient has another brother who has paranoid schizophrenia and lung cancer.  The patient is unsure what type of lung cancer his brother has as they do not communicate.  The patient is retired and worked in Science writer.  He also worked at [employer] long in [employer].  He is married and has 1 son.  The patient started smoking when he was in college.  More recently, he started smoking 2 packs of cigarettes per day.  He is working on quitting and smokes approximately 10 cigarettes/day.  He uses NicoDerm patches.  He also uses Nicorette gum and Wellbutrin.  The patient drinks approximately 15 beers per week.  He denies any drug use.     HPI  Past Medical History:  Diagnosis Date  . History of chicken pox   . History of shingles 04/2010  . Lung cancer ([place]) 02/2020    Past Surgical History:  Procedure Laterality Date  . BRONCHIAL BIOPSY  03/22/2020   Procedure: BRONCHIAL BIOPSIES;  Surgeon: Garner Nash, DO;  Location: [place];  Service: Pulmonary;;  . BRONCHIAL BRUSHINGS  03/22/2020   Procedure: BRONCHIAL BRUSHINGS;  Surgeon: Garner Nash, DO;  Location: [place];  Service: Pulmonary;;  . BRONCHIAL NEEDLE ASPIRATION BIOPSY  03/22/2020   Procedure: BRONCHIAL NEEDLE ASPIRATION BIOPSIES;  Surgeon: Garner Nash, DO;  Location: [facility];  Service: Pulmonary;;  . BRONCHIAL WASHINGS  03/22/2020   Procedure: BRONCHIAL WASHINGS;  Surgeon: Garner Nash, DO;  Location: [facility];  Service: Pulmonary;;  . FIDUCIAL MARKER PLACEMENT  03/22/2020   Procedure: FIDUCIAL MARKER PLACEMENT;   Surgeon: Garner Nash, DO;  Location: [facility];  Service: Pulmonary;;  . NO PAST SURGERIES    . VIDEO BRONCHOSCOPY WITH ENDOBRONCHIAL NAVIGATION N/A 03/22/2020   Procedure: VIDEO BRONCHOSCOPY WITH ENDOBRONCHIAL NAVIGATION;  Surgeon: Garner Nash, DO;  Location: [place];  Service: Pulmonary;  Laterality: N/A;  . VIDEO BRONCHOSCOPY WITH ENDOBRONCHIAL ULTRASOUND N/A 03/22/2020   Procedure: VIDEO BRONCHOSCOPY WITH ENDOBRONCHIAL ULTRASOUND;  Surgeon: Garner Nash, DO;  Location: [place];  Service: Pulmonary;  Laterality: N/A;    Family History  Problem Relation Age of Onset  . Sudden death Mother   . Rheumatic fever Mother   . Hypertension Neg Hx   . Hyperlipidemia Neg Hx   . Heart attack Neg Hx   . Diabetes Neg Hx   . Colon cancer Neg Hx   . Stomach cancer Neg Hx     Social History Social History   Tobacco Use  . Smoking status: Current Every Day Smoker    Types: Cigarettes  . Smokeless tobacco: Never Used  . Tobacco comment: <10 cigarettes/day, trying to quit  Vaping Use  . Vaping Use: Never used  Substance Use Topics  . Alcohol use: Yes    Alcohol/week: 5.0 standard drinks    Types: 3 Cans of beer, 2 Shots of liquor per week    Comment: daily  . Drug use: No    Allergies  Allergen Reactions  . Acyclovir And Related Rash    Rash that looked like chicken pox    Current Outpatient Medications  Medication Sig Dispense Refill  . ascorbic acid (VITAMIN C) 500 MG tablet Take 500 mg by mouth daily.    Marland Kitchen aspirin EC 81 MG tablet Take 81 mg by mouth daily.    Marland Kitchen buPROPion (WELLBUTRIN XL) 150 MG 24 hr tablet Take 1 tablet (150 mg total) by mouth daily. 60 tablet 5  . Multiple Vitamin (MULTIVITAMIN) tablet Take 1 tablet by mouth daily.    . nicotine (NICODERM CQ - DOSED IN MG/24 HOURS) 21 mg/24hr patch Place 21 mg onto the skin daily.    . prochlorperazine (COMPAZINE) 10 MG tablet Take 1 tablet (10 mg total) by mouth every 6 (six) hours as needed. 30 tablet  2  . umeclidinium-vilanterol (ANORO ELLIPTA) 62.5-25 MCG/INH AEPB Inhale 1 puff into the lungs daily. (Patient not taking: No sig reported) 3 each 2  . vitamin B-12 (CYANOCOBALAMIN) 1000 MCG tablet Take 1,000 mcg by mouth daily.     No current facility-administered medications for this visit.    REVIEW OF SYSTEMS:   Review of Systems  Constitutional: Negative for appetite change, chills, fatigue, fever and unexpected weight change.  HENT: Negative for mouth sores, nosebleeds, sore throat and trouble swallowing.   Eyes: Negative for eye problems and icterus.  Respiratory: Positive for baseline cough. Negative for hemoptysis, shortness of breath and wheezing.   Cardiovascular: Negative for chest pain and leg swelling.  Gastrointestinal: Negative for abdominal pain, constipation, diarrhea, nausea and vomiting.  Genitourinary: Negative for bladder incontinence, difficulty urinating, dysuria, frequency and hematuria.   Musculoskeletal: Negative for back pain, gait problem, neck pain and neck stiffness.  Skin:  Negative for itching and rash.  Neurological: Negative for dizziness, extremity weakness, gait problem, headaches, light-headedness and seizures.  Hematological: Negative for adenopathy. Does not bruise/bleed easily.  Psychiatric/Behavioral: Negative for confusion, depression and sleep disturbance. The patient is not nervous/anxious.     PHYSICAL EXAMINATION:  Blood pressure (!) 126/59, pulse 84, temperature (!) 97.5 F (36.4 C), temperature source Tympanic, resp. rate 18, height _0  (1.854 m), weight 159 lb 1.6 oz (72.2 kg), SpO2 99 %.  ECOG PERFORMANCE STATUS: 1  Physical Exam  Constitutional: Oriented to person, place, and time and well-developed, well-nourished, and in no distress.  HENT:  Head: Normocephalic and atraumatic.  Mouth/Throat: Oropharynx is clear and moist. No oropharyngeal exudate.  Eyes: Conjunctivae are normal. Right eye exhibits no discharge. Left eye exhibits  no discharge. No scleral icterus.  Neck: Normal range of motion. Neck supple.  Cardiovascular: Normal rate, regular rhythm, normal heart sounds and intact distal pulses.   Pulmonary/Chest: Effort normal and breath sounds normal. No respiratory distress. No wheezes. No rales.  Abdominal: Soft. Bowel sounds are normal. Exhibits no distension and no mass. There is no tenderness.  Musculoskeletal: Normal range of motion. Exhibits no edema.  Lymphadenopathy:    No cervical adenopathy.  Neurological: Alert and oriented to person, place, and time. Exhibits normal muscle tone. Gait normal. Coordination normal.  Skin: Skin is warm and dry. No rash noted. Not diaphoretic. No erythema. No pallor.  Psychiatric: Mood, memory and judgment normal.  Vitals reviewed.  LABORATORY DATA: Lab Results  Component Value Date   WBC 6.1 04/09/2020   HGB 16.7 04/09/2020   HCT 50.2 04/09/2020   MCV 91.9 04/09/2020   PLT 237 04/09/2020      Chemistry      Component Value Date/Time   NA 129 (L) 04/09/2020 0833   K 4.7 04/09/2020 0833   CL 95 (L) 04/09/2020 0833   CO2 26 04/09/2020 0833   BUN 12 04/09/2020 0833   CREATININE 0.87 04/09/2020 0833   CREATININE 0.86 11/12/2011 0917      Component Value Date/Time   CALCIUM 9.4 04/09/2020 0833   ALKPHOS 75 04/09/2020 0833   AST 21 04/09/2020 0833   ALT 24 04/09/2020 0833   BILITOT 0.6 04/09/2020 0833       RADIOGRAPHIC STUDIES: DG Chest 2 View  Result Date: 03/21/2020 CLINICAL DATA:  Pulmonary nodules for biopsy.  No acute complaints. EXAM: CHEST - 2 VIEW COMPARISON:  Radiographs 06/08/2014. CT 02/15/2020. PET-CT 03/01/2020. FINDINGS: The heart size and mediastinal contours are stable without definite adenopathy. The lungs are hyperinflated consistent with chronic obstructive pulmonary disease. The known spiculated right upper lobe nodule is not well seen radiographically, overlapping the thoracic spine on the lateral view. No new or enlarging nodules,  airspace disease, pleural effusion or pneumothorax identified. IMPRESSION: No acute cardiopulmonary process. The known right upper lobe nodule is not well seen radiographically. Electronically Signed   By: Richardean Sale M.D.   On: 03/21/2020 15:29   MR Brain W Wo Contrast  Result Date: 04/08/2020 CLINICAL DATA:  Small-cell lung cancer staging. EXAM: MRI HEAD WITHOUT AND WITH CONTRAST TECHNIQUE: Multiplanar, multiecho pulse sequences of the brain and surrounding structures were obtained without and with intravenous contrast. CONTRAST:  38m GADAVIST GADOBUTROL 1 MMOL/ML IV SOLN COMPARISON:  None. FINDINGS: Brain: There is no evidence of an acute infarct, intracranial hemorrhage, mass, midline shift, or extra-axial fluid collection. The ventricles and sulci are within normal limits for age. No significant white matter disease is  seen. No abnormal enhancement is identified. Vascular: Major intracranial vascular flow voids are preserved. Skull and upper cervical spine: Unremarkable bone marrow signal. Sinuses/Orbits: Unremarkable orbits. Mild mucosal thickening in the paranasal sinuses. Small left mastoid effusion. Other: None. IMPRESSION: Unremarkable appearance of the brain for age. No evidence of intracranial metastases. Electronically Signed   By: Logan Bores M.D.   On: 04/08/2020 18:51   DG CHEST PORT 1 VIEW  Result Date: 03/22/2020 CLINICAL DATA:  Status post bronchoscopy with biopsy. EXAM: PORTABLE CHEST 1 VIEW COMPARISON:  CT 03/22/2020.  Chest x-ray 03/20/2020. FINDINGS: Mediastinum and hilar structures normal. Heart size normal. Surgical clips right upper lobe. Diffuse infiltrate right upper lung. No pleural effusion or pneumothorax. Prominent skin fold noted on the right. Degenerative change in scoliosis thoracic spine. IMPRESSION: 1. Diffuse infiltrate right upper lung. No pneumothorax. 2. Surgical clips right upper lobe. Electronically Signed   By: Marcello Moores  Register   On: 03/22/2020 14:12    Myocardial Perfusion Imaging  Result Date: 03/14/2020  The left ventricular ejection fraction is mildly decreased (45-54%).  Nuclear stress EF: 53%. No wall motion abnormalities noted. Apical thinning noted.  There was no ST segment deviation noted during stress.  This is a low risk study. No signs of ischemia.  Candee Furbish, MD  CT Super D Chest Wo Contrast  Result Date: 03/22/2020 CLINICAL DATA:  Surgical planning in a 69 year old male with upcoming RIGHT upper lobectomy EXAM: CT CHEST WITHOUT CONTRAST TECHNIQUE: Multidetector CT imaging of the chest was performed using thin slice collimation for electromagnetic bronchoscopy planning purposes, without intravenous contrast. COMPARISON:  February 15, 2020 FINDINGS: Cardiovascular: Mild aortic atherosclerosis. No aneurysmal dilation of the thoracic aorta. Central pulmonary vasculature is of normal caliber. Coronary artery calcification, densely calcified coronary vasculature in LEFT coronary circulation. Heart size normal without pericardial effusion. Limited assessment of cardiovascular structures given lack of intravenous contrast. Mediastinum/Nodes: Thoracic inlet structures are normal. No axillary lymphadenopathy no mediastinal lymphadenopathy no hilar lymphadenopathy Lungs/Pleura: Spiculated RIGHT upper lobe nodule (image 47, series 5) 1.4 x 1.2 cm. Small nodules elsewhere along the pleural surface in the RIGHT upper lobe most of these areas appear to represent pleural-parenchymal scarring some displaying signs of calcification. Small noncalcified nodule along the pleural surface posteriorly 2 mm (image 43, series 5) signs of paraseptal and centrilobular emphysema as on the recent comparison evaluation. Scarring or developing subtle nodular focus in the RIGHT lower lobe measuring approximately 5 mm (image 96, series 5) there appear to be inspissated areas within bronchial structures in this area. Upper Abdomen: Imaged portions of liver, spleen,  pancreas, kidneys gastrointestinal tract without acute process. Small lesion arising from the lower pole the LEFT kidney stable approximately 2.4 cm greatest axial dimension, density values slightly above 30 Hounsfield units. Adrenal glands are normal. Musculoskeletal: No acute bone finding. No destructive bone process. Spinal degenerative changes. IMPRESSION: 1. Spiculated RIGHT upper lobe nodule measuring 1.4 x 1.2 cm. As on recent CT of the chest and PET evaluation compatible with bronchogenic neoplasm. 2. Scattered areas of nodularity along the pleural surface in the RIGHT chest more suggestive of pleural and parenchymal scarring. 3. Scarring or developing subtle nodular focus in the RIGHT lower lobe measuring approximately 5 mm there appear to be inspissated areas within bronchial structures in this area. Favor post infectious changes, attention on follow-up. 4. Small lesion arising from the lower pole the LEFT kidney stable approximately 2.4 cm greatest axial dimension, density values slightly above 30 Hounsfield units. This may represent a  hemorrhagic cyst but is incompletely characterized, dedicated renal imaging on follow-up with either ultrasound as initial assessment or pre and post-contrast MRI. 5. Coronary artery calcification 6. Emphysema and aortic atherosclerosis. Aortic Atherosclerosis (ICD10-I70.0) and Emphysema (ICD10-J43.9). Electronically Signed   By: Zetta Bills M.D.   On: 03/22/2020 09:26   DG C-ARM BRONCHOSCOPY  Result Date: 03/22/2020 C-ARM BRONCHOSCOPY: Fluoroscopy was utilized by the requesting physician.  No radiographic interpretation.    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   PLAN: The patient was seen with Dr. Julien Nordmann  today.  Dr. Julien Nordmann had a lengthy discussion with the patient about his condition and recommended treatment options.  Dr. Julien Nordmann recommended the patient undergo chemotherapy/radiation with cisplatin and etoposide 100 mg per metered squared on days 1, 2, and 3 IV every 3 weeks. The patient is interested in this option and he is expected to receive his first dose of treatment on 04/16/2020.   The adverse side effects of treatment were discussed including but not limited to myelosuppression, nausea, vomiting, alopecia, kidney/liver dysfunction.  I will arrange for the patient to have a chemo education class prior to receiving his first cycle of treatment.  I sent a prescription for Compazine 10 mg p.o. every 6 hours as needed for nausea to the patient's pharmacy.  We will see the patient back for follow-up visit in 2 weeks for evaluation and a 1 week follow-up visit after completing his first cycle of treatment to manage any adverse side effects.  I will place [facility] referral to [facility] for further evaluation and management of the suspicious left renal lesion.   I will place a referral to nutrition. The patient is mentions he is very sensitive to nausea and is concerned about losing weight since he is only 169 lbs to begin with. He is interested in learning about food choices to eat with nausea.   The patient voices understanding of current disease status and treatment options and is in agreement with the current care plan.  All questions were answered. The patient knows to call the clinic with any problems, questions or concerns. We can certainly see the patient much sooner if necessary.  Thank you so much for allowing me to participate in the care of Ian Hart. I will continue to follow up the patient with you and assist in his care.  The total time spent in the appointment was 80 minutes.  Disclaimer: This note was dictated with voice recognition software. Similar sounding words can  inadvertently be transcribed and may not be corrected upon review.   Kelsei Defino L Sagan Maselli April 09, 2020, 10:19 AM  ADDENDUM: Hematology/Oncology Attending: I had a face-to-face encounter with the patient today.  I recommended his care plan.  This is a very pleasant 69 years old white male recently diagnosed with limited stage (TX, N2, M0) small cell lung cancer in the subcarinal lymph node.  The patient also has a right upper lobe lung nodule consistent with adenocarcinoma.  This nodule was discovered on a screening CT scan of the chest which was followed by a PET scan on March 01, 2020 and showed hypermetabolic right upper lobe lung nodule as well as isolated hypermetabolism and subcarinal lymphadenopathy.  He also has left renal lesion suspicious for renal cell  carcinoma. I had a lengthy discussion with the patient and his wife today about his current condition and treatment options. I personally and independently reviewed the scan images and discussed the result and showed the images to the patient and his wife. I recommended for the patient treatment for his limited stage small cell lung cancer with systemic chemotherapy consisting of cisplatin 80 mg/M2 on day 1 and 2 etoposide 100 mg/M2 on days 1, 2 and 3 every 3 weeks for a total of 4 cycles.  This will be concurrent with radiotherapy.  The right upper lobe lung nodule will be included in the field of treatment by radiation oncology. After completion of the course of chemotherapy and radiation, the patient may benefit from prophylactic cranial irradiation. I discussed with the patient the adverse effect of the chemotherapy including but not limited to alopecia, myelosuppression, nausea and vomiting, peripheral neuropathy, liver or renal dysfunction. He is expected to start the first cycle of this treatment on April 16, 2020. For the left renal mass, I will refer the patient to urology for evaluation and management. The patient will  come back for follow-up visit in 2 weeks for evaluation and management of any adverse effect of his systemic chemotherapy. He was advised to call immediately if he has any concerning symptoms in the interval. The patient and his wife agreed to the current plan.  Disclaimer: This note was dictated with voice recognition software. Similar sounding words can inadvertently be transcribed and may be missed upon review. Eilleen Kempf, MD 04/09/20

## 2020-04-08 ENCOUNTER — Encounter: Payer: Self-pay | Admitting: Radiation Oncology

## 2020-04-08 ENCOUNTER — Other Ambulatory Visit: Payer: Self-pay

## 2020-04-08 ENCOUNTER — Ambulatory Visit (HOSPITAL_BASED_OUTPATIENT_CLINIC_OR_DEPARTMENT_OTHER)
Admission: RE | Admit: 2020-04-08 | Discharge: 2020-04-08 | Disposition: A | Discharge status: Home / Self Care | Payer: BC Managed Care – PPO | Source: Ambulatory Visit | Attending: Radiation Oncology | Admitting: Radiation Oncology

## 2020-04-08 DIAGNOSIS — C3490 Malignant neoplasm of unspecified part of unspecified bronchus or lung: Secondary | ICD-10-CM | POA: Diagnosis not present

## 2020-04-08 DIAGNOSIS — H748X2 Other specified disorders of left middle ear and mastoid: Secondary | ICD-10-CM | POA: Diagnosis not present

## 2020-04-08 DIAGNOSIS — J3489 Other specified disorders of nose and nasal sinuses: Secondary | ICD-10-CM | POA: Diagnosis not present

## 2020-04-08 MED ORDER — GADOBUTROL 1 MMOL/ML IV SOLN
7.0000 mL | Freq: Once | INTRAVENOUS | Status: AC | PRN
  Administered 2020-04-08: 7 mL via INTRAVENOUS

## 2020-04-09 ENCOUNTER — Telehealth: Payer: Self-pay | Admitting: Radiation Oncology

## 2020-04-09 ENCOUNTER — Other Ambulatory Visit: Payer: Self-pay

## 2020-04-09 ENCOUNTER — Other Ambulatory Visit: Payer: Self-pay | Admitting: Internal Medicine

## 2020-04-09 ENCOUNTER — Inpatient Hospital Stay: Payer: BC Managed Care – PPO | Attending: Physician Assistant | Admitting: Physician Assistant

## 2020-04-09 ENCOUNTER — Telehealth: Payer: Self-pay | Admitting: Physician Assistant

## 2020-04-09 ENCOUNTER — Inpatient Hospital Stay: Payer: BC Managed Care – PPO

## 2020-04-09 VITALS — BP 126/59 | HR 84 | Temp 97.5°F | Resp 18 | Ht 73.0 in | Wt 159.1 lb

## 2020-04-09 DIAGNOSIS — Z801 Family history of malignant neoplasm of trachea, bronchus and lung: Secondary | ICD-10-CM | POA: Diagnosis not present

## 2020-04-09 DIAGNOSIS — R911 Solitary pulmonary nodule: Secondary | ICD-10-CM

## 2020-04-09 DIAGNOSIS — F17210 Nicotine dependence, cigarettes, uncomplicated: Secondary | ICD-10-CM | POA: Insufficient documentation

## 2020-04-09 DIAGNOSIS — N289 Disorder of kidney and ureter, unspecified: Secondary | ICD-10-CM | POA: Diagnosis not present

## 2020-04-09 DIAGNOSIS — Z7189 Other specified counseling: Secondary | ICD-10-CM

## 2020-04-09 DIAGNOSIS — R110 Nausea: Secondary | ICD-10-CM | POA: Diagnosis not present

## 2020-04-09 DIAGNOSIS — C3490 Malignant neoplasm of unspecified part of unspecified bronchus or lung: Secondary | ICD-10-CM

## 2020-04-09 DIAGNOSIS — C3411 Malignant neoplasm of upper lobe, right bronchus or lung: Secondary | ICD-10-CM | POA: Diagnosis not present

## 2020-04-09 DIAGNOSIS — C3491 Malignant neoplasm of unspecified part of right bronchus or lung: Secondary | ICD-10-CM

## 2020-04-09 HISTORY — DX: Other specified counseling: Z71.89

## 2020-04-09 LAB — CMP (CANCER CENTER ONLY)
ALT: 24 U/L (ref 0–44)
AST: 21 U/L (ref 15–41)
Albumin: 4 g/dL (ref 3.5–5.0)
Alkaline Phosphatase: 75 U/L (ref 38–126)
Anion gap: 8 (ref 5–15)
BUN: 12 mg/dL (ref 8–23)
CO2: 26 mmol/L (ref 22–32)
Calcium: 9.4 mg/dL (ref 8.9–10.3)
Chloride: 95 mmol/L — ABNORMAL LOW (ref 98–111)
Creatinine: 0.87 mg/dL (ref 0.61–1.24)
GFR, Estimated: >60 mL/min (ref >60)
Glucose, Bld: 128 mg/dL — ABNORMAL HIGH (ref 70–99)
Potassium: 4.7 mmol/L (ref 3.5–5.1)
Sodium: 129 mmol/L — ABNORMAL LOW (ref 135–145)
Total Bilirubin: 0.6 mg/dL (ref 0.3–1.2)
Total Protein: 7.5 g/dL (ref 6.5–8.1)

## 2020-04-09 LAB — CBC WITH DIFFERENTIAL (CANCER CENTER ONLY)
Abs Immature Granulocytes: 0.03 10*3/uL (ref 0.00–0.07)
Basophils Absolute: 0.1 10*3/uL (ref 0.0–0.1)
Basophils Relative: 1 %
Eosinophils Absolute: 0.1 10*3/uL (ref 0.0–0.5)
Eosinophils Relative: 1 %
HCT: 50.2 % (ref 39.0–52.0)
Hemoglobin: 16.7 g/dL (ref 13.0–17.0)
Immature Granulocytes: 1 %
Lymphocytes Relative: 13 %
Lymphs Abs: 0.8 10*3/uL (ref 0.7–4.0)
MCH: 30.6 pg (ref 26.0–34.0)
MCHC: 33.3 g/dL (ref 30.0–36.0)
MCV: 91.9 fL (ref 80.0–100.0)
Monocytes Absolute: 0.8 10*3/uL (ref 0.1–1.0)
Monocytes Relative: 13 %
Neutro Abs: 4.4 10*3/uL (ref 1.7–7.7)
Neutrophils Relative %: 71 %
Platelet Count: 237 10*3/uL (ref 150–400)
RBC: 5.46 MIL/uL (ref 4.22–5.81)
RDW: 12 % (ref 11.5–15.5)
WBC Count: 6.1 10*3/uL (ref 4.0–10.5)
nRBC: 0 % (ref 0.0–0.2)

## 2020-04-09 MED ORDER — PROCHLORPERAZINE MALEATE 10 MG PO TABS: 10.0000 mg | ORAL_TABLET | Freq: Four times a day (QID) | ORAL | 2 refills | Status: DC | PRN

## 2020-04-09 NOTE — Patient Instructions (Addendum)
-  There are two main categories of lung cancer, they are named based on the size of the cancer cell. One is called Non-Small cell lung cancer. The other type is Small Cell Lung Cancer -The sample (biopsy) that they took of your tumor in the lymph node was consistent with Small Cell Lung Cancer. The spot in the right upper lobe is most consistent with Non-Small Cell Lung Cancer, Adenocarcinoma.  -We covered a lot of important information at your appointment today regarding what the treatment plan is moving forward. Here are the the main points that were discussed at your office visit with [name] today:  -The treatment that you will receive consists of Two chemotherapy drugs, called Cisplatin/Carboplatin and Etoposide.  -We are planning on starting your treatment next week on ~04/17/19 but before your start your treatment, I would like you to attend a Chemotherapy Education Class. This involves having you sit down with one of our nurse educators. She will discuss with your one-on-one more details about your treatment as well as general information about resources here at the [facility] treatment will be given for three consecutive days every 3 weeks. We will check your labs once a week just to make sure that important components of your blood are in an acceptable range. We would do these three days in a row every 3 weeks for a total of somewhere between 4-6 times.  -We will get a CT scan after 2 treatments to check on the progress of treatment  Medications:  -I have sent a few important medication prescriptions to your pharmacy.  -Compazine was sent to your pharmacy. This medication is for nausea. You may take this every 6 hours as needed if you feel nausous.  Side Effects:  -The adverse effect of this treatment including but not limited to alopecia (losing your hair), myelosuppression (drops in the blood counts), nausea and vomiting, peripheral neuropathy (numbness and tingling in the hands and  feet), hearing deficit, liver or renal dysfunction.   Referrals:  -I sent a referral to Urology to discuss the workup for the kidney lesion.   Follow up:  -We will see you back for a follow up visit in 2 weeks for a 1 week follow up visit and to manage any adverse side effects of treatment.

## 2020-04-09 NOTE — Telephone Encounter (Signed)
Phoned patient as requested by Shona Simpson, PA-C. Explained his MRI of the brain is negative for metastatic disease. Patient verbalized understanding. Patient states, "this is the best news I have received all year."

## 2020-04-09 NOTE — Progress Notes (Signed)
START ON PATHWAY REGIMEN - Small Cell Lung     A cycle is every 21 days:     Etoposide      Cisplatin   **Always confirm dose/schedule in your pharmacy ordering system**  Patient Characteristics: Newly Diagnosed, Preoperative or Nonsurgical Candidate (Clinical Staging), First Line, Limited Stage, Nonsurgical Candidate Therapeutic Status: Newly Diagnosed, Preoperative or Nonsurgical Candidate (Clinical Staging) AJCC T Category: cTX AJCC N Category: cN2 AJCC M Category: cM0 AJCC 8 Stage Grouping: Unknown Stage Classification: Limited Surgical Candidacy: Nonsurgical Candidate Intent of Therapy: Curative Intent, Discussed with Patient

## 2020-04-09 NOTE — Telephone Encounter (Signed)
Release: [id number] Faxed referral and records to [facility] @ fax# [phone]

## 2020-04-11 DIAGNOSIS — F17210 Nicotine dependence, cigarettes, uncomplicated: Secondary | ICD-10-CM | POA: Diagnosis not present

## 2020-04-11 DIAGNOSIS — C3411 Malignant neoplasm of upper lobe, right bronchus or lung: Secondary | ICD-10-CM | POA: Diagnosis not present

## 2020-04-11 DIAGNOSIS — Z510 Encounter for antineoplastic radiation therapy: Secondary | ICD-10-CM | POA: Diagnosis not present

## 2020-04-12 ENCOUNTER — Telehealth: Payer: Self-pay | Admitting: Family

## 2020-04-12 ENCOUNTER — Other Ambulatory Visit: Payer: Self-pay | Admitting: *Deleted

## 2020-04-12 MED ORDER — NICOTINE 21 MG/24HR TD PT24: 21.0000 mg | MEDICATED_PATCH | Freq: Every day | TRANSDERMAL | 0 refills | Status: DC

## 2020-04-12 MED ORDER — NICOTINE 14 MG/24HR TD PT24: 14.0000 mg | MEDICATED_PATCH | Freq: Every day | TRANSDERMAL | 0 refills | Status: DC

## 2020-04-12 NOTE — Progress Notes (Signed)
The proposed treatment discussed in cancer conference 12/30 is for discussion purpose only and is not a binding recommendation.  The patient was not physically examined nor present for their treatment options.  Therefore, final treatment plans cannot be decided.

## 2020-04-12 NOTE — Telephone Encounter (Signed)
Please advise 

## 2020-04-12 NOTE — Telephone Encounter (Signed)
Medication: nicotine (NICODERM CQ - DOSED IN MG/24 HOURS) 21 mg/24hr patch (patient is requesting a 14 day supply)  Patient is also requesting a 28 day supply of 14mg  of nicotine patches.   Has the patient contacted their pharmacy? No. (If no, request that the patient contact the pharmacy for the refill.) (If yes, when and what did the pharmacy advise?)  Preferred Pharmacy (with phone number or street name): [id number] - [place], [place] - [address]  [address] [place], [place] [address]  Phone:  [phone] Fax:  [phone]  DEA #:  [id number]  Agent: Please be advised that RX refills may take up to 3 business days. We ask that you follow-up with your pharmacy.

## 2020-04-13 NOTE — Progress Notes (Signed)
.   Pharmacist Chemotherapy Monitoring - Initial Assessment    Anticipated start date: 04/20/20   Regimen:  . Are orders appropriate based on the patient's diagnosis, regimen, and cycle? Yes . Does the plan date match the patient's scheduled date? Yes . Is the sequencing of drugs appropriate? Yes . Are the premedications appropriate for the patient's regimen? Yes . Prior Authorization for treatment is: Approved o If applicable, is the correct biosimilar selected based on the patient's insurance? not applicable  Organ Function and Labs: Marland Kitchen Are dose adjustments needed based on the patient's renal function, hepatic function, or hematologic function? No . Are appropriate labs ordered prior to the start of patient's treatment? Yes . Other organ system assessment, if indicated: N/A . The following baseline labs, if indicated, have been ordered: N/A  Dose Assessment: . Are the drug doses appropriate? Yes . Are the following correct: o Drug concentrations Yes o IV fluid compatible with drug Yes o Administration routes Yes o Timing of therapy Yes . If applicable, does the patient have documented access for treatment and/or plans for port-a-cath placement? not applicable . If applicable, have lifetime cumulative doses been properly documented and assessed? yes Lifetime Dose Tracking  No doses have been documented on this patient for the following tracked chemicals: Doxorubicin, Epirubicin, Idarubicin, Daunorubicin, Mitoxantrone, Bleomycin, Oxaliplatin, Carboplatin, Liposomal Doxorubicin  o   Toxicity Monitoring/Prevention: . The patient has the following take home antiemetics prescribed: Prochlorperazine . The patient has the following take home medications prescribed: N/A . Medication allergies and previous infusion related reactions, if applicable, have been reviewed and addressed. Yes . The patient's current medication list has been assessed for drug-drug interactions with their chemotherapy  regimen. no significant drug-drug interactions were identified on review.  Order Review: . Are the treatment plan orders signed? Yes . Is the patient scheduled to see a provider prior to their treatment? No  I verify that I have reviewed each item in the above checklist and answered each question accordingly.  Wynona Neat 04/13/2020 12:39 PM

## 2020-04-14 HISTORY — PX: ROBOTIC ASSITED PARTIAL NEPHRECTOMY: SHX6087

## 2020-04-16 ENCOUNTER — Other Ambulatory Visit: Payer: Medicare Other

## 2020-04-16 ENCOUNTER — Encounter: Payer: Self-pay | Admitting: Physician Assistant

## 2020-04-16 NOTE — Progress Notes (Signed)
Pt has 2 insurances so copay assistance shouldn't be needed.  I emailed Ailene Ravel and Vincente Liberty in the radiation dept requesting they reach out to the pt regarding the [employer].

## 2020-04-17 ENCOUNTER — Ambulatory Visit
Admission: RE | Admit: 2020-04-17 | Discharge: 2020-04-17 | Disposition: A | Discharge status: Home / Self Care | Payer: Medicare Other | Source: Ambulatory Visit | Attending: Radiation Oncology | Admitting: Radiation Oncology

## 2020-04-17 ENCOUNTER — Inpatient Hospital Stay: Payer: Medicare Other | Attending: Physician Assistant

## 2020-04-17 ENCOUNTER — Other Ambulatory Visit: Payer: Self-pay

## 2020-04-17 DIAGNOSIS — Z87891 Personal history of nicotine dependence: Secondary | ICD-10-CM | POA: Insufficient documentation

## 2020-04-17 DIAGNOSIS — T451X5A Adverse effect of antineoplastic and immunosuppressive drugs, initial encounter: Secondary | ICD-10-CM | POA: Insufficient documentation

## 2020-04-17 DIAGNOSIS — C3491 Malignant neoplasm of unspecified part of right bronchus or lung: Secondary | ICD-10-CM | POA: Insufficient documentation

## 2020-04-17 DIAGNOSIS — F17210 Nicotine dependence, cigarettes, uncomplicated: Secondary | ICD-10-CM | POA: Diagnosis not present

## 2020-04-17 DIAGNOSIS — C3411 Malignant neoplasm of upper lobe, right bronchus or lung: Secondary | ICD-10-CM | POA: Diagnosis not present

## 2020-04-17 DIAGNOSIS — Z5111 Encounter for antineoplastic chemotherapy: Secondary | ICD-10-CM | POA: Insufficient documentation

## 2020-04-17 DIAGNOSIS — D701 Agranulocytosis secondary to cancer chemotherapy: Secondary | ICD-10-CM | POA: Insufficient documentation

## 2020-04-18 ENCOUNTER — Ambulatory Visit
Admission: RE | Admit: 2020-04-18 | Discharge: 2020-04-18 | Disposition: A | Discharge status: Home / Self Care | Payer: Medicare Other | Source: Ambulatory Visit | Attending: Radiation Oncology | Admitting: Radiation Oncology

## 2020-04-18 ENCOUNTER — Other Ambulatory Visit: Payer: Self-pay

## 2020-04-18 DIAGNOSIS — C3411 Malignant neoplasm of upper lobe, right bronchus or lung: Secondary | ICD-10-CM | POA: Diagnosis not present

## 2020-04-18 DIAGNOSIS — C3491 Malignant neoplasm of unspecified part of right bronchus or lung: Secondary | ICD-10-CM | POA: Diagnosis not present

## 2020-04-18 DIAGNOSIS — F17210 Nicotine dependence, cigarettes, uncomplicated: Secondary | ICD-10-CM | POA: Diagnosis not present

## 2020-04-19 ENCOUNTER — Inpatient Hospital Stay: Payer: Medicare Other

## 2020-04-19 ENCOUNTER — Ambulatory Visit
Admission: RE | Admit: 2020-04-19 | Discharge: 2020-04-19 | Disposition: A | Discharge status: Home / Self Care | Payer: Medicare Other | Source: Ambulatory Visit | Attending: Radiation Oncology | Admitting: Radiation Oncology

## 2020-04-19 ENCOUNTER — Other Ambulatory Visit: Payer: Self-pay

## 2020-04-19 DIAGNOSIS — C3491 Malignant neoplasm of unspecified part of right bronchus or lung: Secondary | ICD-10-CM | POA: Diagnosis not present

## 2020-04-19 DIAGNOSIS — C3411 Malignant neoplasm of upper lobe, right bronchus or lung: Secondary | ICD-10-CM | POA: Diagnosis not present

## 2020-04-19 DIAGNOSIS — Z87891 Personal history of nicotine dependence: Secondary | ICD-10-CM | POA: Diagnosis not present

## 2020-04-19 DIAGNOSIS — T451X5A Adverse effect of antineoplastic and immunosuppressive drugs, initial encounter: Secondary | ICD-10-CM | POA: Diagnosis not present

## 2020-04-19 DIAGNOSIS — F17210 Nicotine dependence, cigarettes, uncomplicated: Secondary | ICD-10-CM | POA: Diagnosis not present

## 2020-04-19 DIAGNOSIS — C3490 Malignant neoplasm of unspecified part of unspecified bronchus or lung: Secondary | ICD-10-CM

## 2020-04-19 DIAGNOSIS — D701 Agranulocytosis secondary to cancer chemotherapy: Secondary | ICD-10-CM | POA: Diagnosis not present

## 2020-04-19 DIAGNOSIS — Z5111 Encounter for antineoplastic chemotherapy: Secondary | ICD-10-CM | POA: Diagnosis not present

## 2020-04-19 LAB — CMP (CANCER CENTER ONLY)
ALT: 19 U/L (ref 0–44)
AST: 22 U/L (ref 15–41)
Albumin: 4 g/dL (ref 3.5–5.0)
Alkaline Phosphatase: 80 U/L (ref 38–126)
Anion gap: 7 (ref 5–15)
BUN: 11 mg/dL (ref 8–23)
CO2: 25 mmol/L (ref 22–32)
Calcium: 9.4 mg/dL (ref 8.9–10.3)
Chloride: 98 mmol/L (ref 98–111)
Creatinine: 0.81 mg/dL (ref 0.61–1.24)
GFR, Estimated: >60 mL/min (ref >60)
Glucose, Bld: 104 mg/dL — ABNORMAL HIGH (ref 70–99)
Potassium: 4.6 mmol/L (ref 3.5–5.1)
Sodium: 130 mmol/L — ABNORMAL LOW (ref 135–145)
Total Bilirubin: 1.1 mg/dL (ref 0.3–1.2)
Total Protein: 7.4 g/dL (ref 6.5–8.1)

## 2020-04-19 LAB — CBC WITH DIFFERENTIAL (CANCER CENTER ONLY)
Abs Immature Granulocytes: 0.04 10*3/uL (ref 0.00–0.07)
Basophils Absolute: 0 10*3/uL (ref 0.0–0.1)
Basophils Relative: 1 %
Eosinophils Absolute: 0.1 10*3/uL (ref 0.0–0.5)
Eosinophils Relative: 1 %
HCT: 46.5 % (ref 39.0–52.0)
Hemoglobin: 15.9 g/dL (ref 13.0–17.0)
Immature Granulocytes: 1 %
Lymphocytes Relative: 15 %
Lymphs Abs: 0.7 10*3/uL (ref 0.7–4.0)
MCH: 31.1 pg (ref 26.0–34.0)
MCHC: 34.2 g/dL (ref 30.0–36.0)
MCV: 91 fL (ref 80.0–100.0)
Monocytes Absolute: 0.8 10*3/uL (ref 0.1–1.0)
Monocytes Relative: 15 %
Neutro Abs: 3.4 10*3/uL (ref 1.7–7.7)
Neutrophils Relative %: 67 %
Platelet Count: 247 10*3/uL (ref 150–400)
RBC: 5.11 MIL/uL (ref 4.22–5.81)
RDW: 12.1 % (ref 11.5–15.5)
WBC Count: 5 10*3/uL (ref 4.0–10.5)
nRBC: 0 % (ref 0.0–0.2)

## 2020-04-20 ENCOUNTER — Other Ambulatory Visit: Payer: Self-pay

## 2020-04-20 ENCOUNTER — Ambulatory Visit
Admission: RE | Admit: 2020-04-20 | Discharge: 2020-04-20 | Disposition: A | Discharge status: Home / Self Care | Payer: Medicare Other | Source: Ambulatory Visit | Attending: Radiation Oncology | Admitting: Radiation Oncology

## 2020-04-20 ENCOUNTER — Inpatient Hospital Stay: Payer: Medicare Other

## 2020-04-20 VITALS — BP 141/67 | HR 88 | Temp 98.2°F | Resp 20

## 2020-04-20 DIAGNOSIS — C3491 Malignant neoplasm of unspecified part of right bronchus or lung: Secondary | ICD-10-CM | POA: Diagnosis not present

## 2020-04-20 DIAGNOSIS — T451X5A Adverse effect of antineoplastic and immunosuppressive drugs, initial encounter: Secondary | ICD-10-CM | POA: Diagnosis not present

## 2020-04-20 DIAGNOSIS — C3411 Malignant neoplasm of upper lobe, right bronchus or lung: Secondary | ICD-10-CM | POA: Diagnosis not present

## 2020-04-20 DIAGNOSIS — D701 Agranulocytosis secondary to cancer chemotherapy: Secondary | ICD-10-CM | POA: Diagnosis not present

## 2020-04-20 DIAGNOSIS — Z87891 Personal history of nicotine dependence: Secondary | ICD-10-CM | POA: Diagnosis not present

## 2020-04-20 DIAGNOSIS — Z5111 Encounter for antineoplastic chemotherapy: Secondary | ICD-10-CM | POA: Diagnosis not present

## 2020-04-20 DIAGNOSIS — F17210 Nicotine dependence, cigarettes, uncomplicated: Secondary | ICD-10-CM | POA: Diagnosis not present

## 2020-04-20 MED ORDER — SONAFINE EX EMUL
1.0000 "application " | Freq: Once | CUTANEOUS | Status: AC
  Administered 2020-04-20: 1 via TOPICAL

## 2020-04-20 MED ORDER — SODIUM CHLORIDE 0.9 % IV SOLN
150.0000 mg | Freq: Once | INTRAVENOUS | Status: AC
  Administered 2020-04-20: 150 mg via INTRAVENOUS
  Filled 2020-04-20: qty 150
  Filled 2020-04-20: qty 5

## 2020-04-20 MED ORDER — PALONOSETRON HCL INJECTION 0.25 MG/5ML
INTRAVENOUS | Status: AC
  Filled 2020-04-20: qty 5

## 2020-04-20 MED ORDER — MAGNESIUM SULFATE 2 GM/50ML IV SOLN
INTRAVENOUS | Status: AC
  Filled 2020-04-20: qty 50

## 2020-04-20 MED ORDER — SODIUM CHLORIDE 0.9 % IV SOLN
Freq: Once | INTRAVENOUS | Status: AC
  Filled 2020-04-20: qty 250

## 2020-04-20 MED ORDER — SODIUM CHLORIDE 0.9 % IV SOLN
77.5000 mg/m2 | Freq: Once | INTRAVENOUS | Status: AC
  Administered 2020-04-20: 150 mg via INTRAVENOUS
  Filled 2020-04-20: qty 150

## 2020-04-20 MED ORDER — MAGNESIUM SULFATE 2 GM/50ML IV SOLN
2.0000 g | Freq: Once | INTRAVENOUS | Status: AC
  Administered 2020-04-20: 2 g via INTRAVENOUS

## 2020-04-20 MED ORDER — PALONOSETRON HCL INJECTION 0.25 MG/5ML
0.2500 mg | Freq: Once | INTRAVENOUS | Status: AC
  Administered 2020-04-20: 0.25 mg via INTRAVENOUS

## 2020-04-20 MED ORDER — SODIUM CHLORIDE 0.9 % IV SOLN
100.0000 mg/m2 | Freq: Once | INTRAVENOUS | Status: AC
  Administered 2020-04-20: 190 mg via INTRAVENOUS
  Filled 2020-04-20: qty 9.5

## 2020-04-20 MED ORDER — POTASSIUM CHLORIDE IN NACL 20-0.9 MEQ/L-% IV SOLN
Freq: Once | INTRAVENOUS | Status: AC
  Filled 2020-04-20: qty 1000

## 2020-04-20 MED ORDER — SODIUM CHLORIDE 0.9 % IV SOLN
10.0000 mg | Freq: Once | INTRAVENOUS | Status: AC
  Administered 2020-04-20: 10 mg via INTRAVENOUS
  Filled 2020-04-20: qty 10
  Filled 2020-04-20: qty 1

## 2020-04-20 NOTE — Patient Instructions (Signed)
[facility] Discharge Instructions for Patients Receiving Chemotherapy  Today you received the following chemotherapy agents Cisplatin and Etoposide  To help prevent nausea and vomiting after your treatment, we encourage you to take your nausea medication as prescribed.   If you develop nausea and vomiting that is not controlled by your nausea medication, call the clinic.   BELOW ARE SYMPTOMS THAT SHOULD BE REPORTED IMMEDIATELY:  *FEVER GREATER THAN 100.5 F  *CHILLS WITH OR WITHOUT FEVER  NAUSEA AND VOMITING THAT IS NOT CONTROLLED WITH YOUR NAUSEA MEDICATION  *UNUSUAL SHORTNESS OF BREATH  *UNUSUAL BRUISING OR BLEEDING  TENDERNESS IN MOUTH AND THROAT WITH OR WITHOUT PRESENCE OF ULCERS  *URINARY PROBLEMS  *BOWEL PROBLEMS  UNUSUAL RASH Items with * indicate a potential emergency and should be followed up as soon as possible.  Feel free to call the clinic should you have any questions or concerns. The clinic phone number is [phone].  Please show the [facility] at check-in to the Emergency Department and triage nurse.  Cisplatin injection What is this medicine? CISPLATIN (SIS pla tin) is a chemotherapy drug. It targets fast dividing cells, like cancer cells, and causes these cells to die. This medicine is used to treat many types of cancer like bladder, ovarian, and testicular cancers. This medicine may be used for other purposes; ask your health care provider or pharmacist if you have questions. COMMON BRAND NAME(S): Platinol, Platinol -AQ What should I tell my health care provider before I take this medicine? They need to know if you have any of these conditions:  eye disease, vision problems  hearing problems  kidney disease  low blood counts, like white cells, platelets, or red blood cells  tingling of the fingers or toes, or other nerve disorder  an unusual or allergic reaction to cisplatin, carboplatin, oxaliplatin, other medicines,  foods, dyes, or preservatives  pregnant or trying to get pregnant  breast-feeding How should I use this medicine? This drug is given as an infusion into a vein. It is administered in a hospital or clinic by a specially trained health care professional. Talk to your pediatrician regarding the use of this medicine in children. Special care may be needed. Overdosage: If you think you have taken too much of this medicine contact a poison control center or emergency room at once. NOTE: This medicine is only for you. Do not share this medicine with others. What if I miss a dose? It is important not to miss a dose. Call your doctor or health care professional if you are unable to keep an appointment. What may interact with this medicine? This medicine may interact with the following medications:  foscarnet  certain antibiotics like amikacin, gentamicin, neomycin, polymyxin B, streptomycin, tobramycin, vancomycin This list may not describe all possible interactions. Give your health care provider a list of all the medicines, herbs, non-prescription drugs, or dietary supplements you use. Also tell them if you smoke, drink alcohol, or use illegal drugs. Some items may interact with your medicine. What should I watch for while using this medicine? Your condition will be monitored carefully while you are receiving this medicine. You will need important blood work done while you are taking this medicine. This drug may make you feel generally unwell. This is not uncommon, as chemotherapy can affect healthy cells as well as cancer cells. Report any side effects. Continue your course of treatment even though you feel ill unless your doctor tells you to stop. This medicine may  increase your risk of getting an infection. Call your healthcare professional for advice if you get a fever, chills, or sore throat, or other symptoms of a cold or flu. Do not treat yourself. Try to avoid being around people who are  sick. Avoid taking medicines that contain aspirin, acetaminophen, ibuprofen, naproxen, or ketoprofen unless instructed by your healthcare professional. These medicines may hide a fever. This medicine may increase your risk to bruise or bleed. Call your doctor or health care professional if you notice any unusual bleeding. Be careful brushing and flossing your teeth or using a toothpick because you may get an infection or bleed more easily. If you have any dental work done, tell your dentist you are receiving this medicine. Do not become pregnant while taking this medicine or for 14 months after stopping it. Women should inform their healthcare professional if they wish to become pregnant or think they might be pregnant. Men should not father a child while taking this medicine and for 11 months after stopping it. There is potential for serious side effects to an unborn child. Talk to your healthcare professional for more information. Do not breast-feed an infant while taking this medicine. This medicine has caused ovarian failure in some women. This medicine may make it more difficult to get pregnant. Talk to your healthcare professional if you are concerned about your fertility. This medicine has caused decreased sperm counts in some men. This may make it more difficult to father a child. Talk to your healthcare professional if you are concerned about your fertility. Drink fluids as directed while you are taking this medicine. This will help protect your kidneys. Call your doctor or health care professional if you get diarrhea. Do not treat yourself. What side effects may I notice from receiving this medicine? Side effects that you should report to your doctor or health care professional as soon as possible:  allergic reactions like skin rash, itching or hives, swelling of the face, lips, or tongue  blurred vision  changes in vision  decreased hearing or ringing of the ears  nausea,  vomiting  pain, redness, or irritation at site where injected  pain, tingling, numbness in the hands or feet  signs and symptoms of bleeding such as bloody or black, tarry stools; red or dark brown urine; spitting up blood or brown material that looks like coffee grounds; red spots on the skin; unusual bruising or bleeding from the eyes, gums, or nose  signs and symptoms of infection like fever; chills; cough; sore throat; pain or trouble passing urine  signs and symptoms of kidney injury like trouble passing urine or change in the amount of urine  signs and symptoms of low red blood cells or anemia such as unusually weak or tired; feeling faint or lightheaded; falls; breathing problems Side effects that usually do not require medical attention (report to your doctor or health care professional if they continue or are bothersome):  loss of appetite  mouth sores  muscle cramps This list may not describe all possible side effects. Call your doctor for medical advice about side effects. You may report side effects to FDA at 1-800-FDA-1088. Where should I keep my medicine? This drug is given in a hospital or clinic and will not be stored at home. NOTE: This sheet is a summary. It may not cover all possible information. If you have questions about this medicine, talk to your doctor, pharmacist, or health care provider.  2020 Elsevier/Gold Standard (2018-03-26 15:59:17)  Etoposide,  VP-16 injection What is this medicine? ETOPOSIDE, VP-16 (e toe POE side) is a chemotherapy drug. It is used to treat testicular cancer, lung cancer, and other cancers. This medicine may be used for other purposes; ask your health care provider or pharmacist if you have questions. COMMON BRAND NAME(S): Etopophos, Toposar, VePesid What should I tell my health care provider before I take this medicine? They need to know if you have any of these conditions:  infection  kidney disease  liver disease  low  blood counts, like low white cell, platelet, or red cell counts  an unusual or allergic reaction to etoposide, other medicines, foods, dyes, or preservatives  pregnant or trying to get pregnant  breast-feeding How should I use this medicine? This medicine is for infusion into a vein. It is administered in a hospital or clinic by a specially trained health care professional. Talk to your pediatrician regarding the use of this medicine in children. Special care may be needed. Overdosage: If you think you have taken too much of this medicine contact a poison control center or emergency room at once. NOTE: This medicine is only for you. Do not share this medicine with others. What if I miss a dose? It is important not to miss your dose. Call your doctor or health care professional if you are unable to keep an appointment. What may interact with this medicine? This medicine may interact with the following medications:  warfarin This list may not describe all possible interactions. Give your health care provider a list of all the medicines, herbs, non-prescription drugs, or dietary supplements you use. Also tell them if you smoke, drink alcohol, or use illegal drugs. Some items may interact with your medicine. What should I watch for while using this medicine? Visit your doctor for checks on your progress. This drug may make you feel generally unwell. This is not uncommon, as chemotherapy can affect healthy cells as well as cancer cells. Report any side effects. Continue your course of treatment even though you feel ill unless your doctor tells you to stop. In some cases, you may be given additional medicines to help with side effects. Follow all directions for their use. Call your doctor or health care professional for advice if you get a fever, chills or sore throat, or other symptoms of a cold or flu. Do not treat yourself. This drug decreases your body's ability to fight infections. Try to avoid  being around people who are sick. This medicine may increase your risk to bruise or bleed. Call your doctor or health care professional if you notice any unusual bleeding. Talk to your doctor about your risk of cancer. You may be more at risk for certain types of cancers if you take this medicine. Do not become pregnant while taking this medicine or for at least 6 months after stopping it. Women should inform their doctor if they wish to become pregnant or think they might be pregnant. Women of child-bearing potential will need to have a negative pregnancy test before starting this medicine. There is a potential for serious side effects to an unborn child. Talk to your health care professional or pharmacist for more information. Do not breast-feed an infant while taking this medicine. Men must use a latex condom during sexual contact with a woman while taking this medicine and for at least 4 months after stopping it. A latex condom is needed even if you have had a vasectomy. Contact your doctor right away if your partner  becomes pregnant. Do not donate sperm while taking this medicine and for at least 4 months after you stop taking this medicine. Men should inform their doctors if they wish to father a child. This medicine may lower sperm counts. What side effects may I notice from receiving this medicine? Side effects that you should report to your doctor or health care professional as soon as possible:  allergic reactions like skin rash, itching or hives, swelling of the face, lips, or tongue  low blood counts - this medicine may decrease the number of white blood cells, red blood cells, and platelets. You may be at increased risk for infections and bleeding  nausea, vomiting  redness, blistering, peeling or loosening of the skin, including inside the mouth  signs and symptoms of infection like fever; chills; cough; sore throat; pain or trouble passing urine  signs and symptoms of low red blood  cells or anemia such as unusually weak or tired; feeling faint or lightheaded; falls; breathing problems  unusual bruising or bleeding Side effects that usually do not require medical attention (report to your doctor or health care professional if they continue or are bothersome):  changes in taste  diarrhea  hair loss  loss of appetite  mouth sores This list may not describe all possible side effects. Call your doctor for medical advice about side effects. You may report side effects to FDA at 1-800-FDA-1088. Where should I keep my medicine? This drug is given in a hospital or clinic and will not be stored at home. NOTE: This sheet is a summary. It may not cover all possible information. If you have questions about this medicine, talk to your doctor, pharmacist, or health care provider.  2020 Elsevier/Gold Standard (2018-05-26 16:57:15)

## 2020-04-20 NOTE — Progress Notes (Deleted)
[facility] OFFICE PROGRESS NOTE  Debbrah Alar, NP [place] [address]  DIAGNOSIS: ***  PRIOR THERAPY:  CURRENT THERAPY:  INTERVAL HISTORY: Ian Hart 70 y.o. male returns for *** regular *** visit for followup of ***   MEDICAL HISTORY: Past Medical History:  Diagnosis Date  . History of chicken pox   . History of shingles 04/2010  . Lung cancer ([place]) 02/2020    ALLERGIES:  is allergic to acyclovir and related.  MEDICATIONS:  Current Outpatient Medications  Medication Sig Dispense Refill  . ascorbic acid (VITAMIN C) 500 MG tablet Take 500 mg by mouth daily.    Marland Kitchen aspirin EC 81 MG tablet Take 81 mg by mouth daily.    Marland Kitchen buPROPion (WELLBUTRIN XL) 150 MG 24 hr tablet Take 1 tablet (150 mg total) by mouth daily. 60 tablet 5  . Multiple Vitamin (MULTIVITAMIN) tablet Take 1 tablet by mouth daily.    . nicotine (NICODERM CQ - DOSED IN MG/24 HOURS) 14 mg/24hr patch Place 1 patch (14 mg total) onto the skin daily. 14 patch 0  . nicotine (NICODERM CQ - DOSED IN MG/24 HOURS) 21 mg/24hr patch Place 1 patch (21 mg total) onto the skin daily. 14 patch 0  . prochlorperazine (COMPAZINE) 10 MG tablet Take 1 tablet (10 mg total) by mouth every 6 (six) hours as needed. 30 tablet 2  . umeclidinium-vilanterol (ANORO ELLIPTA) 62.5-25 MCG/INH AEPB Inhale 1 puff into the lungs daily. (Patient not taking: No sig reported) 3 each 2  . vitamin B-12 (CYANOCOBALAMIN) 1000 MCG tablet Take 1,000 mcg by mouth daily.     No current facility-administered medications for this visit.    SURGICAL HISTORY:  Past Surgical History:  Procedure Laterality Date  . BRONCHIAL BIOPSY  03/22/2020   Procedure: BRONCHIAL BIOPSIES;  Surgeon: Garner Nash, DO;  Location: [facility];  Service: Pulmonary;;  . BRONCHIAL BRUSHINGS  03/22/2020   Procedure: BRONCHIAL BRUSHINGS;  Surgeon: Garner Nash, DO;  Location: [facility];  Service: Pulmonary;;  . BRONCHIAL  NEEDLE ASPIRATION BIOPSY  03/22/2020   Procedure: BRONCHIAL NEEDLE ASPIRATION BIOPSIES;  Surgeon: Garner Nash, DO;  Location: [facility];  Service: Pulmonary;;  . BRONCHIAL WASHINGS  03/22/2020   Procedure: BRONCHIAL WASHINGS;  Surgeon: Garner Nash, DO;  Location: [facility];  Service: Pulmonary;;  . FIDUCIAL MARKER PLACEMENT  03/22/2020   Procedure: FIDUCIAL MARKER PLACEMENT;  Surgeon: Garner Nash, DO;  Location: [facility];  Service: Pulmonary;;  . NO PAST SURGERIES    . VIDEO BRONCHOSCOPY WITH ENDOBRONCHIAL NAVIGATION N/A 03/22/2020   Procedure: VIDEO BRONCHOSCOPY WITH ENDOBRONCHIAL NAVIGATION;  Surgeon: Garner Nash, DO;  Location: [facility];  Service: Pulmonary;  Laterality: N/A;  . VIDEO BRONCHOSCOPY WITH ENDOBRONCHIAL ULTRASOUND N/A 03/22/2020   Procedure: VIDEO BRONCHOSCOPY WITH ENDOBRONCHIAL ULTRASOUND;  Surgeon: Garner Nash, DO;  Location: [facility];  Service: Pulmonary;  Laterality: N/A;    REVIEW OF SYSTEMS:   Review of Systems  Constitutional: Negative for appetite change, chills, fatigue, fever and unexpected weight change.  HENT:   Negative for mouth sores, nosebleeds, sore throat and trouble swallowing.   Eyes: Negative for eye problems and icterus.  Respiratory: Negative for cough, hemoptysis, shortness of breath and wheezing.   Cardiovascular: Negative for chest pain and leg swelling.  Gastrointestinal: Negative for abdominal pain, constipation, diarrhea, nausea and vomiting.  Genitourinary: Negative for bladder incontinence, difficulty urinating, dysuria, frequency and hematuria.   Musculoskeletal:  Negative for back pain, gait problem, neck pain and neck stiffness.  Skin: Negative for itching and rash.  Neurological: Negative for dizziness, extremity weakness, gait problem, headaches, light-headedness and seizures.  Hematological: Negative for adenopathy. Does not bruise/bleed easily.  Psychiatric/Behavioral: Negative for confusion,  depression and sleep disturbance. The patient is not nervous/anxious.     PHYSICAL EXAMINATION:  There were no vitals taken for this visit.  ECOG PERFORMANCE STATUS: {CHL ONC ECOG [id number]  Physical Exam  Constitutional: Oriented to person, place, and time and well-developed, well-nourished, and in no distress. No distress.  HENT:  Head: Normocephalic and atraumatic.  Mouth/Throat: Oropharynx is clear and moist. No oropharyngeal exudate.  Eyes: Conjunctivae are normal. Right eye exhibits no discharge. Left eye exhibits no discharge. No scleral icterus.  Neck: Normal range of motion. Neck supple.  Cardiovascular: Normal rate, regular rhythm, normal heart sounds and intact distal pulses.   Pulmonary/Chest: Effort normal and breath sounds normal. No respiratory distress. No wheezes. No rales.  Abdominal: Soft. Bowel sounds are normal. Exhibits no distension and no mass. There is no tenderness.  Musculoskeletal: Normal range of motion. Exhibits no edema.  Lymphadenopathy:    No cervical adenopathy.  Neurological: Alert and oriented to person, place, and time. Exhibits normal muscle tone. Gait normal. Coordination normal.  Skin: Skin is warm and dry. No rash noted. Not diaphoretic. No erythema. No pallor.  Psychiatric: Mood, memory and judgment normal.  Vitals reviewed.  LABORATORY DATA: Lab Results  Component Value Date   WBC 5.0 04/19/2020   HGB 15.9 04/19/2020   HCT 46.5 04/19/2020   MCV 91.0 04/19/2020   PLT 247 04/19/2020      Chemistry      Component Value Date/Time   NA 130 (L) 04/19/2020 0850   K 4.6 04/19/2020 0850   CL 98 04/19/2020 0850   CO2 25 04/19/2020 0850   BUN 11 04/19/2020 0850   CREATININE 0.81 04/19/2020 0850   CREATININE 0.86 11/12/2011 0917      Component Value Date/Time   CALCIUM 9.4 04/19/2020 0850   ALKPHOS 80 04/19/2020 0850   AST 22 04/19/2020 0850   ALT 19 04/19/2020 0850   BILITOT 1.1 04/19/2020 0850       RADIOGRAPHIC  STUDIES:  MR Brain W Wo Contrast  Result Date: 04/08/2020 CLINICAL DATA:  Small-cell lung cancer staging. EXAM: MRI HEAD WITHOUT AND WITH CONTRAST TECHNIQUE: Multiplanar, multiecho pulse sequences of the brain and surrounding structures were obtained without and with intravenous contrast. CONTRAST:  7mL GADAVIST GADOBUTROL 1 MMOL/ML IV SOLN COMPARISON:  None. FINDINGS: Brain: There is no evidence of an acute infarct, intracranial hemorrhage, mass, midline shift, or extra-axial fluid collection. The ventricles and sulci are within normal limits for age. No significant white matter disease is seen. No abnormal enhancement is identified. Vascular: Major intracranial vascular flow voids are preserved. Skull and upper cervical spine: Unremarkable bone marrow signal. Sinuses/Orbits: Unremarkable orbits. Mild mucosal thickening in the paranasal sinuses. Small left mastoid effusion. Other: None. IMPRESSION: Unremarkable appearance of the brain for age. No evidence of intracranial metastases. Electronically Signed   By: Logan Bores M.D.   On: 04/08/2020 18:51   DG CHEST PORT 1 VIEW  Result Date: 03/22/2020 CLINICAL DATA:  Status post bronchoscopy with biopsy. EXAM: PORTABLE CHEST 1 VIEW COMPARISON:  CT 03/22/2020.  Chest x-ray 03/20/2020. FINDINGS: Mediastinum and hilar structures normal. Heart size normal. Surgical clips right upper lobe. Diffuse infiltrate right upper lung. No pleural effusion or pneumothorax. Prominent skin fold  noted on the right. Degenerative change in scoliosis thoracic spine. IMPRESSION: 1. Diffuse infiltrate right upper lung. No pneumothorax. 2. Surgical clips right upper lobe. Electronically Signed   By: Marcello Moores  Register   On: 03/22/2020 14:12   CT Super D Chest Wo Contrast  Result Date: 03/22/2020 CLINICAL DATA:  Surgical planning in a 71 year old male with upcoming RIGHT upper lobectomy EXAM: CT CHEST WITHOUT CONTRAST TECHNIQUE: Multidetector CT imaging of the chest was performed  using thin slice collimation for electromagnetic bronchoscopy planning purposes, without intravenous contrast. COMPARISON:  February 15, 2020 FINDINGS: Cardiovascular: Mild aortic atherosclerosis. No aneurysmal dilation of the thoracic aorta. Central pulmonary vasculature is of normal caliber. Coronary artery calcification, densely calcified coronary vasculature in LEFT coronary circulation. Heart size normal without pericardial effusion. Limited assessment of cardiovascular structures given lack of intravenous contrast. Mediastinum/Nodes: Thoracic inlet structures are normal. No axillary lymphadenopathy no mediastinal lymphadenopathy no hilar lymphadenopathy Lungs/Pleura: Spiculated RIGHT upper lobe nodule (image 47, series 5) 1.4 x 1.2 cm. Small nodules elsewhere along the pleural surface in the RIGHT upper lobe most of these areas appear to represent pleural-parenchymal scarring some displaying signs of calcification. Small noncalcified nodule along the pleural surface posteriorly 2 mm (image 43, series 5) signs of paraseptal and centrilobular emphysema as on the recent comparison evaluation. Scarring or developing subtle nodular focus in the RIGHT lower lobe measuring approximately 5 mm (image 96, series 5) there appear to be inspissated areas within bronchial structures in this area. Upper Abdomen: Imaged portions of liver, spleen, pancreas, kidneys gastrointestinal tract without acute process. Small lesion arising from the lower pole the LEFT kidney stable approximately 2.4 cm greatest axial dimension, density values slightly above 30 Hounsfield units. Adrenal glands are normal. Musculoskeletal: No acute bone finding. No destructive bone process. Spinal degenerative changes. IMPRESSION: 1. Spiculated RIGHT upper lobe nodule measuring 1.4 x 1.2 cm. As on recent CT of the chest and PET evaluation compatible with bronchogenic neoplasm. 2. Scattered areas of nodularity along the pleural surface in the RIGHT chest  more suggestive of pleural and parenchymal scarring. 3. Scarring or developing subtle nodular focus in the RIGHT lower lobe measuring approximately 5 mm there appear to be inspissated areas within bronchial structures in this area. Favor post infectious changes, attention on follow-up. 4. Small lesion arising from the lower pole the LEFT kidney stable approximately 2.4 cm greatest axial dimension, density values slightly above 30 Hounsfield units. This may represent a hemorrhagic cyst but is incompletely characterized, dedicated renal imaging on follow-up with either ultrasound as initial assessment or pre and post-contrast MRI. 5. Coronary artery calcification 6. Emphysema and aortic atherosclerosis. Aortic Atherosclerosis (ICD10-I70.0) and Emphysema (ICD10-J43.9). Electronically Signed   By: Zetta Bills M.D.   On: 03/22/2020 09:26   DG C-ARM BRONCHOSCOPY  Result Date: 03/22/2020 C-ARM BRONCHOSCOPY: Fluoroscopy was utilized by the requesting physician.  No radiographic interpretation.     ASSESSMENT/PLAN:  No problem-specific Assessment & Plan notes found for this encounter.   No orders of the defined types were placed in this encounter.    I spent {CHL ONC TIME VISIT - OEUMP:[id number]} counseling the patient face to face. The total time spent in the appointment was {CHL ONC TIME VISIT - MGQQP:[id number]}.  Beckham Capistran L Dickey Caamano, PA-C 04/20/20

## 2020-04-21 ENCOUNTER — Inpatient Hospital Stay: Payer: Medicare Other

## 2020-04-21 VITALS — BP 116/79 | HR 76 | Temp 98.3°F | Resp 18 | Wt 161.2 lb

## 2020-04-21 DIAGNOSIS — C3411 Malignant neoplasm of upper lobe, right bronchus or lung: Secondary | ICD-10-CM | POA: Diagnosis not present

## 2020-04-21 DIAGNOSIS — D701 Agranulocytosis secondary to cancer chemotherapy: Secondary | ICD-10-CM | POA: Diagnosis not present

## 2020-04-21 DIAGNOSIS — T451X5A Adverse effect of antineoplastic and immunosuppressive drugs, initial encounter: Secondary | ICD-10-CM | POA: Diagnosis not present

## 2020-04-21 DIAGNOSIS — Z87891 Personal history of nicotine dependence: Secondary | ICD-10-CM | POA: Diagnosis not present

## 2020-04-21 DIAGNOSIS — C3491 Malignant neoplasm of unspecified part of right bronchus or lung: Secondary | ICD-10-CM

## 2020-04-21 DIAGNOSIS — Z5111 Encounter for antineoplastic chemotherapy: Secondary | ICD-10-CM | POA: Diagnosis not present

## 2020-04-21 MED ORDER — SODIUM CHLORIDE 0.9 % IV SOLN
Freq: Once | INTRAVENOUS | Status: AC
  Filled 2020-04-21: qty 250

## 2020-04-21 MED ORDER — SODIUM CHLORIDE 0.9 % IV SOLN
100.0000 mg/m2 | Freq: Once | INTRAVENOUS | Status: AC
  Administered 2020-04-21: 190 mg via INTRAVENOUS
  Filled 2020-04-21: qty 9.5

## 2020-04-21 MED ORDER — SODIUM CHLORIDE 0.9 % IV SOLN
10.0000 mg | Freq: Once | INTRAVENOUS | Status: AC
  Administered 2020-04-21: 10 mg via INTRAVENOUS
  Filled 2020-04-21: qty 10

## 2020-04-21 NOTE — Patient Instructions (Signed)
[facility] Discharge Instructions for Patients Receiving Chemotherapy  Today you received the following chemotherapy agents: Etoposide  To help prevent nausea and vomiting after your treatment, we encourage you to take your nausea medication as prescribed.   If you develop nausea and vomiting that is not controlled by your nausea medication, call the clinic.   BELOW ARE SYMPTOMS THAT SHOULD BE REPORTED IMMEDIATELY:  *FEVER GREATER THAN 100.5 F  *CHILLS WITH OR WITHOUT FEVER  NAUSEA AND VOMITING THAT IS NOT CONTROLLED WITH YOUR NAUSEA MEDICATION  *UNUSUAL SHORTNESS OF BREATH  *UNUSUAL BRUISING OR BLEEDING  TENDERNESS IN MOUTH AND THROAT WITH OR WITHOUT PRESENCE OF ULCERS  *URINARY PROBLEMS  *BOWEL PROBLEMS  UNUSUAL RASH Items with * indicate a potential emergency and should be followed up as soon as possible.  Feel free to call the clinic should you have any questions or concerns. The clinic phone number is [phone].

## 2020-04-23 ENCOUNTER — Ambulatory Visit
Admission: RE | Admit: 2020-04-23 | Discharge: 2020-04-23 | Disposition: A | Discharge status: Home / Self Care | Payer: Medicare Other | Source: Ambulatory Visit | Attending: Radiation Oncology | Admitting: Radiation Oncology

## 2020-04-23 ENCOUNTER — Other Ambulatory Visit: Payer: Self-pay

## 2020-04-23 ENCOUNTER — Inpatient Hospital Stay: Payer: Medicare Other

## 2020-04-23 VITALS — BP 154/80 | HR 78 | Temp 97.9°F | Resp 18

## 2020-04-23 DIAGNOSIS — C3491 Malignant neoplasm of unspecified part of right bronchus or lung: Secondary | ICD-10-CM

## 2020-04-23 DIAGNOSIS — T451X5A Adverse effect of antineoplastic and immunosuppressive drugs, initial encounter: Secondary | ICD-10-CM | POA: Diagnosis not present

## 2020-04-23 DIAGNOSIS — F17210 Nicotine dependence, cigarettes, uncomplicated: Secondary | ICD-10-CM | POA: Diagnosis not present

## 2020-04-23 DIAGNOSIS — C3411 Malignant neoplasm of upper lobe, right bronchus or lung: Secondary | ICD-10-CM | POA: Diagnosis not present

## 2020-04-23 DIAGNOSIS — Z87891 Personal history of nicotine dependence: Secondary | ICD-10-CM | POA: Diagnosis not present

## 2020-04-23 DIAGNOSIS — Z5111 Encounter for antineoplastic chemotherapy: Secondary | ICD-10-CM | POA: Diagnosis not present

## 2020-04-23 DIAGNOSIS — D701 Agranulocytosis secondary to cancer chemotherapy: Secondary | ICD-10-CM | POA: Diagnosis not present

## 2020-04-23 MED ORDER — SODIUM CHLORIDE 0.9 % IV SOLN
100.0000 mg/m2 | Freq: Once | INTRAVENOUS | Status: AC
  Administered 2020-04-23: 190 mg via INTRAVENOUS
  Filled 2020-04-23: qty 9.5

## 2020-04-23 MED ORDER — SODIUM CHLORIDE 0.9 % IV SOLN
10.0000 mg | Freq: Once | INTRAVENOUS | Status: AC
  Administered 2020-04-23: 10 mg via INTRAVENOUS
  Filled 2020-04-23: qty 10

## 2020-04-23 MED ORDER — SODIUM CHLORIDE 0.9 % IV SOLN
Freq: Once | INTRAVENOUS | Status: AC
  Filled 2020-04-23: qty 250

## 2020-04-23 NOTE — Patient Instructions (Signed)
[facility] Discharge Instructions for Patients Receiving Chemotherapy  Today you received the following chemotherapy agent: Etoposide  To help prevent nausea and vomiting after your treatment, we encourage you to take your nausea medication as prescribed.   If you develop nausea and vomiting that is not controlled by your nausea medication, call the clinic.   BELOW ARE SYMPTOMS THAT SHOULD BE REPORTED IMMEDIATELY:  *FEVER GREATER THAN 100.5 F  *CHILLS WITH OR WITHOUT FEVER  NAUSEA AND VOMITING THAT IS NOT CONTROLLED WITH YOUR NAUSEA MEDICATION  *UNUSUAL SHORTNESS OF BREATH  *UNUSUAL BRUISING OR BLEEDING  TENDERNESS IN MOUTH AND THROAT WITH OR WITHOUT PRESENCE OF ULCERS  *URINARY PROBLEMS  *BOWEL PROBLEMS  UNUSUAL RASH Items with * indicate a potential emergency and should be followed up as soon as possible.  Feel free to call the clinic should you have any questions or concerns. The clinic phone number is [phone].

## 2020-04-24 ENCOUNTER — Telehealth: Payer: Self-pay | Admitting: *Deleted

## 2020-04-24 ENCOUNTER — Ambulatory Visit: Payer: Medicare Other | Admitting: Nutrition

## 2020-04-24 ENCOUNTER — Ambulatory Visit
Admission: RE | Admit: 2020-04-24 | Discharge: 2020-04-24 | Disposition: A | Discharge status: Home / Self Care | Payer: Medicare Other | Source: Ambulatory Visit | Attending: Radiation Oncology | Admitting: Radiation Oncology

## 2020-04-24 ENCOUNTER — Other Ambulatory Visit: Payer: Self-pay

## 2020-04-24 DIAGNOSIS — F17210 Nicotine dependence, cigarettes, uncomplicated: Secondary | ICD-10-CM | POA: Diagnosis not present

## 2020-04-24 DIAGNOSIS — C3491 Malignant neoplasm of unspecified part of right bronchus or lung: Secondary | ICD-10-CM | POA: Diagnosis not present

## 2020-04-24 DIAGNOSIS — C3411 Malignant neoplasm of upper lobe, right bronchus or lung: Secondary | ICD-10-CM | POA: Diagnosis not present

## 2020-04-24 NOTE — Telephone Encounter (Signed)
-----   Message from Jesse Fall, [place] sent at 04/23/2020 10:22 AM EST ----- Regarding: FW: Ian Hart 1st Tx F/U call - Cisplatin, Etoposide  ----- Message ----- From: Rolene Course, RN Sent: 04/20/2020   4:12 PM EST To: [facility] Triage Nurse [facility] Subject: Ian Hart 1st Tx F/U call - Cisplatin, Etoposi#  Ian Hart 1st Tx F/U call - Cisplatin, Etoposide

## 2020-04-24 NOTE — Telephone Encounter (Signed)
Called pt to see how he did with his first treatment & he reports doing OK.  He is eating & drinking well & no bowel problems.  He did have some heartburn & felt tired.  He picked up Mylanta for next treatment if needed by heartburn is gone now. He knows how to reach [name] if needed.

## 2020-04-24 NOTE — Progress Notes (Signed)
70 year old male diagnosed with small cell lung cancer receiving chemotherapy and radiation treatments.  He is a patient of Dr. Julien Nordmann.  Past medical history includes COPD, depression, osteoporosis, tobacco, and alcohol.  Medications include vitamin C, Wellbutrin, multivitamin, Compazine, vitamin B12.  Labs include sodium 130 and glucose 104 on January 6.  Height: 6 feet 1 inch. Weight: 161.25 pounds on January 8. Usual body weight: 160 pounds. BMI: 21.27.  Patient is concerned regarding possibility of nausea and oral intake during treatment. He reports he currently has not had any nausea and is eating well. He denies weight loss. Reports he has indigestion/heartburn and was told to buy an over-the-counter medication. Reports wife is struggling with his diagnosis.  He would like to arrange for her to receive coping strategies as they process his diagnosis.  Nutrition diagnosis: Food and nutrition related knowledge deficit related to cancer as evidenced by no prior need for nutrition related information.  Intervention: Educated patient to consume small, frequent meals and snacks with adequate calories and protein for weight maintenance. Stressed importance of nausea prevention.  Reviewed foods to incorporate if he develops nausea and vomiting. Educated on strategies for reducing indigestion/heartburn. Provided fact sheets.  Questions were answered.  Teach back method used. I have provided him with chaplain information at his request.  Monitoring, evaluation, goals: Patient will tolerate adequate calories and protein with minimal side effects to promote weight maintenance throughout treatment.  Next visit: Patient prefers to contact me for any further questions, concerns or educational needs.  **Disclaimer: This note was dictated with voice recognition software. Similar sounding words can inadvertently be transcribed and this note may contain transcription errors which may not have  been corrected upon publication of note.**

## 2020-04-25 ENCOUNTER — Ambulatory Visit: Payer: Medicare Other | Admitting: Physician Assistant

## 2020-04-25 ENCOUNTER — Ambulatory Visit
Admission: RE | Admit: 2020-04-25 | Discharge: 2020-04-25 | Disposition: A | Discharge status: Home / Self Care | Payer: Medicare Other | Source: Ambulatory Visit | Attending: Radiation Oncology | Admitting: Radiation Oncology

## 2020-04-25 ENCOUNTER — Other Ambulatory Visit: Payer: Medicare Other

## 2020-04-25 ENCOUNTER — Other Ambulatory Visit: Payer: Self-pay

## 2020-04-25 ENCOUNTER — Encounter: Payer: Self-pay | Admitting: General Practice

## 2020-04-25 DIAGNOSIS — C3411 Malignant neoplasm of upper lobe, right bronchus or lung: Secondary | ICD-10-CM | POA: Diagnosis not present

## 2020-04-25 DIAGNOSIS — C3491 Malignant neoplasm of unspecified part of right bronchus or lung: Secondary | ICD-10-CM | POA: Diagnosis not present

## 2020-04-25 DIAGNOSIS — F17210 Nicotine dependence, cigarettes, uncomplicated: Secondary | ICD-10-CM | POA: Diagnosis not present

## 2020-04-25 NOTE — Progress Notes (Signed)
[facility] Spiritual Care Note  Connected with Mr Letarte by phone per referral from [facility]. Mr Danzy is interested in caregiver support for his wife. Emailed him a G[employer]er about our upcoming Care for the Caregiver series and assured him of ongoing chaplain availability for spiritual/emotional support for both patients and families, including in-person, video, and phone visits. He plans to share the flyer with his wife, and they may reach out again after discussing.   [place], [place], [facility] Pager [phone] Voicemail [phone]

## 2020-04-26 ENCOUNTER — Ambulatory Visit
Admission: RE | Admit: 2020-04-26 | Discharge: 2020-04-26 | Disposition: A | Discharge status: Home / Self Care | Payer: Medicare Other | Source: Ambulatory Visit | Attending: Radiation Oncology | Admitting: Radiation Oncology

## 2020-04-26 ENCOUNTER — Other Ambulatory Visit: Payer: Self-pay

## 2020-04-26 DIAGNOSIS — C3491 Malignant neoplasm of unspecified part of right bronchus or lung: Secondary | ICD-10-CM | POA: Diagnosis not present

## 2020-04-26 DIAGNOSIS — C3411 Malignant neoplasm of upper lobe, right bronchus or lung: Secondary | ICD-10-CM | POA: Diagnosis not present

## 2020-04-26 DIAGNOSIS — F17210 Nicotine dependence, cigarettes, uncomplicated: Secondary | ICD-10-CM | POA: Diagnosis not present

## 2020-04-27 ENCOUNTER — Ambulatory Visit: Payer: Medicare Other | Admitting: Physician Assistant

## 2020-04-27 ENCOUNTER — Other Ambulatory Visit: Payer: Self-pay

## 2020-04-27 ENCOUNTER — Telehealth: Payer: Self-pay | Admitting: Physician Assistant

## 2020-04-27 ENCOUNTER — Other Ambulatory Visit: Payer: Medicare Other

## 2020-04-27 ENCOUNTER — Ambulatory Visit
Admission: RE | Admit: 2020-04-27 | Discharge: 2020-04-27 | Disposition: A | Discharge status: Home / Self Care | Payer: Medicare Other | Source: Ambulatory Visit | Attending: Radiation Oncology | Admitting: Radiation Oncology

## 2020-04-27 DIAGNOSIS — C3411 Malignant neoplasm of upper lobe, right bronchus or lung: Secondary | ICD-10-CM | POA: Diagnosis not present

## 2020-04-27 DIAGNOSIS — C3491 Malignant neoplasm of unspecified part of right bronchus or lung: Secondary | ICD-10-CM | POA: Diagnosis not present

## 2020-04-27 DIAGNOSIS — F17210 Nicotine dependence, cigarettes, uncomplicated: Secondary | ICD-10-CM | POA: Diagnosis not present

## 2020-04-27 NOTE — Telephone Encounter (Signed)
Rescheduled appts per 1/14 email/inclement weather. Left voicemail with appt cancellation and new appt date and time.

## 2020-04-30 ENCOUNTER — Ambulatory Visit: Payer: Medicare Other

## 2020-04-30 ENCOUNTER — Other Ambulatory Visit: Payer: Medicare Other

## 2020-04-30 ENCOUNTER — Ambulatory Visit: Payer: Medicare Other | Admitting: Physician Assistant

## 2020-04-30 NOTE — Progress Notes (Signed)
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 3) Suspicious left renal lesion   PRIOR THERAPY: None  CURRENT THERAPY: Systemic chemotherapy/radiation with cisplatin 80 mg per metered squared on day 1 and etoposide 100 mg per metered squared on days 1, 2, and 3 IV every 3 weeks with Cosela support.  Status post 1 cycle.  First dose on 04/20/20.  INTERVAL HISTORY: Ian Hart 70 y.o. male returns to the clinic today for a follow-up visit.  The patient is feeling fairly well today without any concerning complaints except for heart burn following chemotherapy. He has been taking 1/2 a tums. Of note, the patient is laying down on the couch right after eating.    The patient was recently diagnosed with small cell lung cancer in the station 7 lymph node; therefore, he was started on systemic chemotherapy with cisplatin and etoposide.  He had his first cycle of treatment on 04/20/2020 and tolerated it fairly without any concerning adverse side effects except for some fatigue and the heartburn. He had a few episodes of nausea without vomiting.  In the interval since his last appointment, the patient met with a member of the nutritionist team regarding his concern for weight loss. He started consuming carnation breakfast essentials. Despite this, he actually lost about 6 lbs since his last appointment.  His wife was also referred by nutrition to the spiritual service team due to having a challenging time coping with his recent diagnosis.  He is also working on smoking cessation and had his last cigarette 2 days ago.  Had his first visit, he was referred to urology for the suspicious hypermetabolic left kidney lesion.  He has an appointment with [facility] on  05/14/20.   Otherwise, he denies any fevers, chills, or night sweats. He denies any significant chest pain, hemoptysis, or dyspnea on exertion.  He reports a cough which produces clear sputum.  He denies any diarrhea or constipation.  He denies any headache or visual changes. He is here for evaluation and a 1 week follow up visit.    MEDICAL HISTORY: Past Medical History:  Diagnosis Date  . History of chicken pox   . History of shingles 04/2010  . Lung cancer ([place]) 02/2020    ALLERGIES:  is allergic to acyclovir and related.  MEDICATIONS:  Current Outpatient Medications  Medication Sig Dispense Refill  . ascorbic acid (VITAMIN C) 500 MG tablet Take 500 mg by mouth daily.    Marland Kitchen buPROPion (WELLBUTRIN XL) 150 MG 24 hr tablet Take 1 tablet (150 mg total) by mouth daily. 60 tablet 5  . Multiple Vitamin (MULTIVITAMIN) tablet Take 1 tablet by mouth daily.    . nicotine (NICODERM CQ - DOSED IN MG/24 HOURS) 21 mg/24hr patch Place 1 patch (21 mg total) onto the skin daily. 14 patch 0  . omeprazole (PRILOSEC) 20 MG capsule Take 1 capsule (20 mg total) by mouth daily. 30 capsule 1  . prochlorperazine (COMPAZINE) 10 MG tablet Take 1 tablet (10 mg total) by mouth every 6 (six) hours as needed. 30 tablet 2  . umeclidinium-vilanterol (ANORO ELLIPTA) 62.5-25 MCG/INH AEPB Inhale 1 puff into the lungs daily. 3 each 2  . vitamin B-12 (CYANOCOBALAMIN) 1000 MCG tablet Take 1,000 mcg by mouth daily.    Marland Kitchen  nicotine (NICODERM CQ - DOSED IN MG/24 HOURS) 14 mg/24hr patch Place 1 patch (14 mg total) onto the skin daily. (Patient not taking: Reported on 05/02/2020) 14 patch 0   No current facility-administered medications for this visit.    SURGICAL HISTORY:  Past Surgical History:  Procedure Laterality Date  . BRONCHIAL BIOPSY  03/22/2020   Procedure: BRONCHIAL BIOPSIES;  Surgeon: Garner Nash, DO;  Location: [facility];  Service: Pulmonary;;  . BRONCHIAL BRUSHINGS  03/22/2020   Procedure: BRONCHIAL  BRUSHINGS;  Surgeon: Garner Nash, DO;  Location: [facility];  Service: Pulmonary;;  . BRONCHIAL NEEDLE ASPIRATION BIOPSY  03/22/2020   Procedure: BRONCHIAL NEEDLE ASPIRATION BIOPSIES;  Surgeon: Garner Nash, DO;  Location: [facility];  Service: Pulmonary;;  . BRONCHIAL WASHINGS  03/22/2020   Procedure: BRONCHIAL WASHINGS;  Surgeon: Garner Nash, DO;  Location: [facility];  Service: Pulmonary;;  . FIDUCIAL MARKER PLACEMENT  03/22/2020   Procedure: FIDUCIAL MARKER PLACEMENT;  Surgeon: Garner Nash, DO;  Location: [facility];  Service: Pulmonary;;  . NO PAST SURGERIES    . VIDEO BRONCHOSCOPY WITH ENDOBRONCHIAL NAVIGATION N/A 03/22/2020   Procedure: VIDEO BRONCHOSCOPY WITH ENDOBRONCHIAL NAVIGATION;  Surgeon: Garner Nash, DO;  Location: [facility];  Service: Pulmonary;  Laterality: N/A;  . VIDEO BRONCHOSCOPY WITH ENDOBRONCHIAL ULTRASOUND N/A 03/22/2020   Procedure: VIDEO BRONCHOSCOPY WITH ENDOBRONCHIAL ULTRASOUND;  Surgeon: Garner Nash, DO;  Location: [facility];  Service: Pulmonary;  Laterality: N/A;    REVIEW OF SYSTEMS:   Review of Systems  Constitutional: Positive for fatigue and weight loss. Negative for appetite change, chills, and fever.  HENT: Negative for mouth sores, nosebleeds, sore throat and trouble swallowing.   Eyes: Negative for eye problems and icterus.  Respiratory: Positive for cough. Negative for hemoptysis, shortness of breath and wheezing.   Cardiovascular: Negative for chest pain and leg swelling.  Gastrointestinal: Positive for intermittent nausea. Positive for heartburn. Negative for abdominal pain, constipation, diarrhea, and vomiting.  Genitourinary: Negative for bladder incontinence, difficulty urinating, dysuria, frequency and hematuria.   Musculoskeletal: Negative for back pain, gait problem, neck pain and neck stiffness.  Skin: Negative for itching and rash.  Neurological: Negative for dizziness, extremity weakness, gait problem,  headaches, light-headedness and seizures.  Hematological: Negative for adenopathy. Does not bruise/bleed easily.  Psychiatric/Behavioral: Negative for confusion, depression and sleep disturbance. The patient is not nervous/anxious.     PHYSICAL EXAMINATION:  Blood pressure 116/67, pulse 87, temperature 97.9 F (36.6 C), temperature source Tympanic, resp. rate 12, height 6' (1.829 m), weight 152 lb (68.9 kg), SpO2 100 %.  ECOG PERFORMANCE STATUS: 1 - Symptomatic but completely ambulatory  Physical Exam  Constitutional: Oriented to person, place, and time and thin appearing male and in no distress.  HENT:  Head: Normocephalic and atraumatic.  Mouth/Throat: Oropharynx is clear and moist. No oropharyngeal exudate.  Eyes: Conjunctivae are normal. Right eye exhibits no discharge. Left eye exhibits no discharge. No scleral icterus.  Neck: Normal range of motion. Neck supple.  Cardiovascular: Normal rate, regular rhythm, normal heart sounds and intact distal pulses.   Pulmonary/Chest: Effort normal and breath sounds normal. No respiratory distress. No wheezes. No rales.  Abdominal: Soft. Bowel sounds are normal. Exhibits no distension and no mass. There is no tenderness.  Musculoskeletal: Normal range of motion. Exhibits no edema. Scoliosis noted.  Lymphadenopathy:    No cervical adenopathy.  Neurological: Alert and oriented to person, place, and time. Exhibits normal muscle tone. Gait normal. Coordination normal.  Skin: Skin is warm and dry. No rash noted. Not diaphoretic. No erythema. No pallor.  Psychiatric: Mood, memory and judgment normal.  Vitals reviewed.  LABORATORY DATA: Lab Results  Component Value Date   WBC 0.7 (LL) 05/02/2020   HGB 14.1 05/02/2020   HCT 41.3 05/02/2020   MCV 91.0 05/02/2020   PLT 50 (L) 05/02/2020      Chemistry      Component Value Date/Time   NA 131 (L) 05/02/2020 0848   K 4.3 05/02/2020 0848   CL 98 05/02/2020 0848   CO2 26 05/02/2020 0848   BUN  15 05/02/2020 0848   CREATININE 0.86 05/02/2020 0848   CREATININE 0.86 11/12/2011 0917      Component Value Date/Time   CALCIUM 9.0 05/02/2020 0848   ALKPHOS 74 05/02/2020 0848   AST 19 05/02/2020 0848   ALT 25 05/02/2020 0848   BILITOT 0.5 05/02/2020 0848       RADIOGRAPHIC STUDIES:  MR Brain W Wo Contrast  Result Date: 04/08/2020 CLINICAL DATA:  Small-cell lung cancer staging. EXAM: MRI HEAD WITHOUT AND WITH CONTRAST TECHNIQUE: Multiplanar, multiecho pulse sequences of the brain and surrounding structures were obtained without and with intravenous contrast. CONTRAST:  33m GADAVIST GADOBUTROL 1 MMOL/ML IV SOLN COMPARISON:  None. FINDINGS: Brain: There is no evidence of an acute infarct, intracranial hemorrhage, mass, midline shift, or extra-axial fluid collection. The ventricles and sulci are within normal limits for age. No significant white matter disease is seen. No abnormal enhancement is identified. Vascular: Major intracranial vascular flow voids are preserved. Skull and upper cervical spine: Unremarkable bone marrow signal. Sinuses/Orbits: Unremarkable orbits. Mild mucosal thickening in the paranasal sinuses. Small left mastoid effusion. Other: None. IMPRESSION: Unremarkable appearance of the brain for age. No evidence of intracranial metastases. Electronically Signed   By: ALogan BoresM.D.   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 3) Suspicious left renal lesion. Evaluation with urology scheduled for 05/14/20.  The patient is currently undergoing chemotherapy with cisplatin 80 mg/m2 on days 1 and etoposide 100 mg/m2 on days 1, 2, and 3 IV every 3 weeks with cosela support. First dose on 04/20/20. He is  status post 1 cycle. He is also receiving concurrent radiation.   The patient was seen with Dr. MJulien Nordmanntoday. Labs were reviewed. His labs show a total WBC of 0.7 and ANC of 0.5. We will arrange for the patient to received zarxio on 05/03/20 and 05/04/20. Insurance authorization has been obtained this morning. Dr. MJulien Nordmanndoes not want to add neulasta with cycle #2 at this time. The patient denies signs or symptoms of infection. Neutropenic precautions were reviewed with the patient. We also reviewed pandemic precautions. We will continue to monitor his labs closely weekly  We will see him back for for a follow up visit in 2 weeks for evaluation before starting cycle #2.   He will follow up with urology later this month as scheduled for the suspicious renal lesion.   I have sent a prescription for omeprazole to his pharmacy for his heart burn. Also encouraged the patient to sit upright for 60-90 minutes after eating.   The patient was advised to call immediately if he has any concerning symptoms in  the interval. The patient voices understanding of current disease status and treatment options and is in agreement with the current care plan. All questions were answered. The patient knows to call the clinic with any problems, questions or concerns. We can certainly see the patient much sooner if necessary  No orders of the defined types were placed in this encounter.    I spent 30-39 minutes for this encounter.   Heiress Williamson L Meily Glowacki, PA-C 05/02/20  ADDENDUM: Hematology/Oncology Attending: I had a face-to-face encounter with the patient today.  I reviewed his records and labs and recommended his care plan.  The patient is a very pleasant 70 years old white male recently diagnosed with limited stage small cell lung cancer as well as poorly differentiated carcinoma consistent with adenocarcinoma in the right upper lobe. The patient is currently undergoing systemic chemotherapy with cisplatin  and etoposide status post 1 cycle of treatment given around 10 days ago.  He tolerated the first 10 days of his treatment well except for fatigue. I recommended for him to continue his treatment as planned.  This is concurrent with radiotherapy. For the chemotherapy-induced neutropenia, I will arrange for the patient to receive Zarxio 300 mcg subcutaneously for 2 days. For the acid reflux, the patient will continue on treatment with omeprazole. He will come back for follow-up visit in around 2 weeks for evaluation before starting cycle #2 of his treatment. The patient was advised to call immediately if he has any other concerning symptoms in the interval.  Disclaimer: This note was dictated with voice recognition software. Similar sounding words can inadvertently be transcribed and may be missed upon review. Eilleen Kempf, MD 05/02/20

## 2020-05-01 ENCOUNTER — Ambulatory Visit
Admission: RE | Admit: 2020-05-01 | Discharge: 2020-05-01 | Disposition: A | Discharge status: Home / Self Care | Payer: Medicare Other | Source: Ambulatory Visit | Attending: Radiation Oncology | Admitting: Radiation Oncology

## 2020-05-01 DIAGNOSIS — F17210 Nicotine dependence, cigarettes, uncomplicated: Secondary | ICD-10-CM | POA: Diagnosis not present

## 2020-05-01 DIAGNOSIS — C3491 Malignant neoplasm of unspecified part of right bronchus or lung: Secondary | ICD-10-CM | POA: Diagnosis not present

## 2020-05-01 DIAGNOSIS — C3411 Malignant neoplasm of upper lobe, right bronchus or lung: Secondary | ICD-10-CM | POA: Diagnosis not present

## 2020-05-02 ENCOUNTER — Other Ambulatory Visit: Payer: Self-pay

## 2020-05-02 ENCOUNTER — Telehealth: Payer: Self-pay | Admitting: Physician Assistant

## 2020-05-02 ENCOUNTER — Inpatient Hospital Stay: Payer: Medicare Other | Admitting: Physician Assistant

## 2020-05-02 ENCOUNTER — Encounter: Payer: Self-pay | Admitting: Physician Assistant

## 2020-05-02 ENCOUNTER — Inpatient Hospital Stay: Payer: Medicare Other

## 2020-05-02 ENCOUNTER — Ambulatory Visit
Admission: RE | Admit: 2020-05-02 | Discharge: 2020-05-02 | Disposition: A | Discharge status: Home / Self Care | Payer: Medicare Other | Source: Ambulatory Visit | Attending: Radiation Oncology | Admitting: Radiation Oncology

## 2020-05-02 VITALS — BP 116/67 | HR 87 | Temp 97.9°F | Resp 12 | Ht 72.0 in | Wt 152.0 lb

## 2020-05-02 DIAGNOSIS — C3491 Malignant neoplasm of unspecified part of right bronchus or lung: Secondary | ICD-10-CM

## 2020-05-02 DIAGNOSIS — D709 Neutropenia, unspecified: Secondary | ICD-10-CM | POA: Insufficient documentation

## 2020-05-02 DIAGNOSIS — R12 Heartburn: Secondary | ICD-10-CM | POA: Insufficient documentation

## 2020-05-02 DIAGNOSIS — D701 Agranulocytosis secondary to cancer chemotherapy: Secondary | ICD-10-CM

## 2020-05-02 DIAGNOSIS — T451X5A Adverse effect of antineoplastic and immunosuppressive drugs, initial encounter: Secondary | ICD-10-CM | POA: Diagnosis not present

## 2020-05-02 DIAGNOSIS — F17210 Nicotine dependence, cigarettes, uncomplicated: Secondary | ICD-10-CM | POA: Diagnosis not present

## 2020-05-02 DIAGNOSIS — Z5111 Encounter for antineoplastic chemotherapy: Secondary | ICD-10-CM | POA: Diagnosis not present

## 2020-05-02 DIAGNOSIS — C3490 Malignant neoplasm of unspecified part of unspecified bronchus or lung: Secondary | ICD-10-CM

## 2020-05-02 DIAGNOSIS — Z87891 Personal history of nicotine dependence: Secondary | ICD-10-CM | POA: Diagnosis not present

## 2020-05-02 DIAGNOSIS — C3411 Malignant neoplasm of upper lobe, right bronchus or lung: Secondary | ICD-10-CM | POA: Diagnosis not present

## 2020-05-02 LAB — CMP (CANCER CENTER ONLY)
ALT: 25 U/L (ref 0–44)
AST: 19 U/L (ref 15–41)
Albumin: 3.8 g/dL (ref 3.5–5.0)
Alkaline Phosphatase: 74 U/L (ref 38–126)
Anion gap: 7 (ref 5–15)
BUN: 15 mg/dL (ref 8–23)
CO2: 26 mmol/L (ref 22–32)
Calcium: 9 mg/dL (ref 8.9–10.3)
Chloride: 98 mmol/L (ref 98–111)
Creatinine: 0.86 mg/dL (ref 0.61–1.24)
GFR, Estimated: >60 mL/min (ref >60)
Glucose, Bld: 97 mg/dL (ref 70–99)
Potassium: 4.3 mmol/L (ref 3.5–5.1)
Sodium: 131 mmol/L — ABNORMAL LOW (ref 135–145)
Total Bilirubin: 0.5 mg/dL (ref 0.3–1.2)
Total Protein: 6.9 g/dL (ref 6.5–8.1)

## 2020-05-02 LAB — CBC WITH DIFFERENTIAL (CANCER CENTER ONLY)
Abs Immature Granulocytes: 0 10*3/uL (ref 0.00–0.07)
Basophils Absolute: 0 10*3/uL (ref 0.0–0.1)
Basophils Relative: 1 %
Eosinophils Absolute: 0 10*3/uL (ref 0.0–0.5)
Eosinophils Relative: 4 %
HCT: 41.3 % (ref 39.0–52.0)
Hemoglobin: 14.1 g/dL (ref 13.0–17.0)
Immature Granulocytes: 0 %
Lymphocytes Relative: 19 %
Lymphs Abs: 0.1 10*3/uL — ABNORMAL LOW (ref 0.7–4.0)
MCH: 31.1 pg (ref 26.0–34.0)
MCHC: 34.1 g/dL (ref 30.0–36.0)
MCV: 91 fL (ref 80.0–100.0)
Monocytes Absolute: 0.1 10*3/uL (ref 0.1–1.0)
Monocytes Relative: 7 %
Neutro Abs: 0.5 10*3/uL — ABNORMAL LOW (ref 1.7–7.7)
Neutrophils Relative %: 69 %
Platelet Count: 50 10*3/uL — ABNORMAL LOW (ref 150–400)
RBC: 4.54 MIL/uL (ref 4.22–5.81)
RDW: 11.2 % — ABNORMAL LOW (ref 11.5–15.5)
WBC Count: 0.7 10*3/uL — CL (ref 4.0–10.5)
nRBC: 0 % (ref 0.0–0.2)

## 2020-05-02 LAB — MAGNESIUM: Magnesium: 1.7 mg/dL (ref 1.7–2.4)

## 2020-05-02 MED ORDER — OMEPRAZOLE 20 MG PO CPDR: 20.0000 mg | DELAYED_RELEASE_CAPSULE | Freq: Every day | ORAL | 1 refills | Status: DC

## 2020-05-02 NOTE — Telephone Encounter (Signed)
Left message to schedule injection appointments per 1/19 schedule message. Gave option to call back to schedule.

## 2020-05-02 NOTE — Telephone Encounter (Signed)
Scheduled injection appointments per 1/19 schedule message. Patient is aware.

## 2020-05-03 ENCOUNTER — Other Ambulatory Visit: Payer: Self-pay

## 2020-05-03 ENCOUNTER — Telehealth: Payer: Self-pay | Admitting: Physician Assistant

## 2020-05-03 ENCOUNTER — Ambulatory Visit
Admission: RE | Admit: 2020-05-03 | Discharge: 2020-05-03 | Disposition: A | Discharge status: Home / Self Care | Payer: Medicare Other | Source: Ambulatory Visit | Attending: Radiation Oncology | Admitting: Radiation Oncology

## 2020-05-03 ENCOUNTER — Inpatient Hospital Stay: Payer: Medicare Other

## 2020-05-03 VITALS — BP 120/68 | HR 102 | Temp 98.6°F | Resp 18

## 2020-05-03 DIAGNOSIS — D701 Agranulocytosis secondary to cancer chemotherapy: Secondary | ICD-10-CM | POA: Diagnosis not present

## 2020-05-03 DIAGNOSIS — T451X5A Adverse effect of antineoplastic and immunosuppressive drugs, initial encounter: Secondary | ICD-10-CM

## 2020-05-03 DIAGNOSIS — C3411 Malignant neoplasm of upper lobe, right bronchus or lung: Secondary | ICD-10-CM | POA: Diagnosis not present

## 2020-05-03 DIAGNOSIS — Z87891 Personal history of nicotine dependence: Secondary | ICD-10-CM | POA: Diagnosis not present

## 2020-05-03 DIAGNOSIS — Z5111 Encounter for antineoplastic chemotherapy: Secondary | ICD-10-CM | POA: Diagnosis not present

## 2020-05-03 DIAGNOSIS — F17210 Nicotine dependence, cigarettes, uncomplicated: Secondary | ICD-10-CM | POA: Diagnosis not present

## 2020-05-03 DIAGNOSIS — C3491 Malignant neoplasm of unspecified part of right bronchus or lung: Secondary | ICD-10-CM | POA: Diagnosis not present

## 2020-05-03 MED ORDER — FILGRASTIM-SNDZ 300 MCG/0.5ML IJ SOSY
300.0000 ug | PREFILLED_SYRINGE | Freq: Once | INTRAMUSCULAR | Status: AC
  Administered 2020-05-03: 300 ug via SUBCUTANEOUS

## 2020-05-03 MED ORDER — FILGRASTIM-SNDZ 300 MCG/0.5ML IJ SOSY
PREFILLED_SYRINGE | INTRAMUSCULAR | Status: AC
  Filled 2020-05-03: qty 0.5

## 2020-05-03 NOTE — Telephone Encounter (Signed)
Scheduled appointments per 1/19 los. Called patient, no answer. Left message with appointments dates and times

## 2020-05-04 ENCOUNTER — Inpatient Hospital Stay: Payer: Medicare Other

## 2020-05-04 ENCOUNTER — Other Ambulatory Visit: Payer: Self-pay

## 2020-05-04 ENCOUNTER — Other Ambulatory Visit: Payer: Self-pay | Admitting: Radiation Oncology

## 2020-05-04 ENCOUNTER — Ambulatory Visit
Admission: RE | Admit: 2020-05-04 | Discharge: 2020-05-04 | Disposition: A | Discharge status: Home / Self Care | Payer: Medicare Other | Source: Ambulatory Visit | Attending: Radiation Oncology | Admitting: Radiation Oncology

## 2020-05-04 VITALS — BP 128/78 | HR 61 | Resp 18

## 2020-05-04 DIAGNOSIS — T451X5A Adverse effect of antineoplastic and immunosuppressive drugs, initial encounter: Secondary | ICD-10-CM | POA: Diagnosis not present

## 2020-05-04 DIAGNOSIS — Z5111 Encounter for antineoplastic chemotherapy: Secondary | ICD-10-CM | POA: Diagnosis not present

## 2020-05-04 DIAGNOSIS — D701 Agranulocytosis secondary to cancer chemotherapy: Secondary | ICD-10-CM

## 2020-05-04 DIAGNOSIS — C3411 Malignant neoplasm of upper lobe, right bronchus or lung: Secondary | ICD-10-CM | POA: Diagnosis not present

## 2020-05-04 DIAGNOSIS — F17210 Nicotine dependence, cigarettes, uncomplicated: Secondary | ICD-10-CM | POA: Diagnosis not present

## 2020-05-04 DIAGNOSIS — C3491 Malignant neoplasm of unspecified part of right bronchus or lung: Secondary | ICD-10-CM | POA: Diagnosis not present

## 2020-05-04 DIAGNOSIS — Z87891 Personal history of nicotine dependence: Secondary | ICD-10-CM | POA: Diagnosis not present

## 2020-05-04 MED ORDER — SUCRALFATE 1 G PO TABS: 1.0000 g | ORAL_TABLET | Freq: Four times a day (QID) | ORAL | 2 refills | Status: DC

## 2020-05-04 MED ORDER — FILGRASTIM-SNDZ 300 MCG/0.5ML IJ SOSY
300.0000 ug | PREFILLED_SYRINGE | Freq: Once | INTRAMUSCULAR | Status: AC
  Administered 2020-05-04: 300 ug via SUBCUTANEOUS

## 2020-05-04 MED ORDER — FILGRASTIM-SNDZ 300 MCG/0.5ML IJ SOSY
PREFILLED_SYRINGE | INTRAMUSCULAR | Status: AC
  Filled 2020-05-04: qty 0.5

## 2020-05-04 NOTE — Patient Instructions (Signed)

## 2020-05-07 ENCOUNTER — Ambulatory Visit
Admission: RE | Admit: 2020-05-07 | Discharge: 2020-05-07 | Disposition: A | Discharge status: Home / Self Care | Payer: Medicare Other | Source: Ambulatory Visit | Attending: Radiation Oncology | Admitting: Radiation Oncology

## 2020-05-07 DIAGNOSIS — C3411 Malignant neoplasm of upper lobe, right bronchus or lung: Secondary | ICD-10-CM | POA: Diagnosis not present

## 2020-05-07 DIAGNOSIS — F17210 Nicotine dependence, cigarettes, uncomplicated: Secondary | ICD-10-CM | POA: Diagnosis not present

## 2020-05-07 DIAGNOSIS — C3491 Malignant neoplasm of unspecified part of right bronchus or lung: Secondary | ICD-10-CM | POA: Diagnosis not present

## 2020-05-08 ENCOUNTER — Ambulatory Visit: Payer: Medicare Other | Admitting: Nutrition

## 2020-05-08 ENCOUNTER — Telehealth: Payer: Self-pay | Admitting: *Deleted

## 2020-05-08 ENCOUNTER — Other Ambulatory Visit: Payer: Self-pay | Admitting: Radiation Oncology

## 2020-05-08 ENCOUNTER — Ambulatory Visit
Admission: RE | Admit: 2020-05-08 | Discharge: 2020-05-08 | Disposition: A | Discharge status: Home / Self Care | Payer: Medicare Other | Source: Ambulatory Visit | Attending: Radiation Oncology | Admitting: Radiation Oncology

## 2020-05-08 DIAGNOSIS — F17210 Nicotine dependence, cigarettes, uncomplicated: Secondary | ICD-10-CM | POA: Diagnosis not present

## 2020-05-08 DIAGNOSIS — C3491 Malignant neoplasm of unspecified part of right bronchus or lung: Secondary | ICD-10-CM | POA: Diagnosis not present

## 2020-05-08 DIAGNOSIS — C3411 Malignant neoplasm of upper lobe, right bronchus or lung: Secondary | ICD-10-CM | POA: Diagnosis not present

## 2020-05-08 MED ORDER — HYDROCODONE-ACETAMINOPHEN 7.5-325 MG/15ML PO SOLN: 10.0000 mL | Freq: Four times a day (QID) | ORAL | 0 refills | Status: DC | PRN

## 2020-05-08 NOTE — Telephone Encounter (Signed)
Spoke with the patient to let him know we sent him some pain medication in to his pharmacy.  We discussed how to take the medication and he was advised to call with any questions or concerns.  He verbalized understanding.   Gloriajean Dell. Leonie Green, BSN

## 2020-05-08 NOTE — Progress Notes (Signed)
Patient stopped by my office this morning complaining of difficulty swallowing. Reports he has been using Carafate prescribed by MD since Saturday but it is not helping him swallow. He describes increased pain. Weight is decreased and documented as 152 pounds on January 19 down from approximately 161 pounds January 8. Patient assures me he wants to eat and has a good appetite but he just is not able to swallow. Contacted provider to share this information.  **Disclaimer: This note was dictated with voice recognition software. Similar sounding words can inadvertently be transcribed and this note may contain transcription errors which may not have been corrected upon publication of note.**

## 2020-05-09 ENCOUNTER — Ambulatory Visit
Admission: RE | Admit: 2020-05-09 | Discharge: 2020-05-09 | Disposition: A | Discharge status: Home / Self Care | Payer: Medicare Other | Source: Ambulatory Visit | Attending: Radiation Oncology | Admitting: Radiation Oncology

## 2020-05-09 ENCOUNTER — Other Ambulatory Visit: Payer: Self-pay

## 2020-05-09 ENCOUNTER — Inpatient Hospital Stay: Payer: Medicare Other

## 2020-05-09 DIAGNOSIS — F17210 Nicotine dependence, cigarettes, uncomplicated: Secondary | ICD-10-CM | POA: Diagnosis not present

## 2020-05-09 DIAGNOSIS — Z87891 Personal history of nicotine dependence: Secondary | ICD-10-CM | POA: Diagnosis not present

## 2020-05-09 DIAGNOSIS — C3411 Malignant neoplasm of upper lobe, right bronchus or lung: Secondary | ICD-10-CM | POA: Diagnosis not present

## 2020-05-09 DIAGNOSIS — Z5111 Encounter for antineoplastic chemotherapy: Secondary | ICD-10-CM | POA: Diagnosis not present

## 2020-05-09 DIAGNOSIS — C3491 Malignant neoplasm of unspecified part of right bronchus or lung: Secondary | ICD-10-CM | POA: Diagnosis not present

## 2020-05-09 DIAGNOSIS — C3490 Malignant neoplasm of unspecified part of unspecified bronchus or lung: Secondary | ICD-10-CM

## 2020-05-09 DIAGNOSIS — T451X5A Adverse effect of antineoplastic and immunosuppressive drugs, initial encounter: Secondary | ICD-10-CM | POA: Diagnosis not present

## 2020-05-09 DIAGNOSIS — D701 Agranulocytosis secondary to cancer chemotherapy: Secondary | ICD-10-CM | POA: Diagnosis not present

## 2020-05-09 LAB — CMP (CANCER CENTER ONLY)
ALT: 20 U/L (ref 0–44)
AST: 16 U/L (ref 15–41)
Albumin: 3.7 g/dL (ref 3.5–5.0)
Alkaline Phosphatase: 92 U/L (ref 38–126)
Anion gap: 10 (ref 5–15)
BUN: 20 mg/dL (ref 8–23)
CO2: 26 mmol/L (ref 22–32)
Calcium: 9.3 mg/dL (ref 8.9–10.3)
Chloride: 101 mmol/L (ref 98–111)
Creatinine: 0.98 mg/dL (ref 0.61–1.24)
GFR, Estimated: >60 mL/min (ref >60)
Glucose, Bld: 100 mg/dL — ABNORMAL HIGH (ref 70–99)
Potassium: 4.1 mmol/L (ref 3.5–5.1)
Sodium: 137 mmol/L (ref 135–145)
Total Bilirubin: 0.3 mg/dL (ref 0.3–1.2)
Total Protein: 7.2 g/dL (ref 6.5–8.1)

## 2020-05-09 LAB — CBC WITH DIFFERENTIAL (CANCER CENTER ONLY)
Abs Immature Granulocytes: 0.23 10*3/uL — ABNORMAL HIGH (ref 0.00–0.07)
Basophils Absolute: 0.1 10*3/uL (ref 0.0–0.1)
Basophils Relative: 2 %
Eosinophils Absolute: 0.1 10*3/uL (ref 0.0–0.5)
Eosinophils Relative: 2 %
HCT: 42.1 % (ref 39.0–52.0)
Hemoglobin: 14.1 g/dL (ref 13.0–17.0)
Immature Granulocytes: 7 %
Lymphocytes Relative: 15 %
Lymphs Abs: 0.5 10*3/uL — ABNORMAL LOW (ref 0.7–4.0)
MCH: 31.1 pg (ref 26.0–34.0)
MCHC: 33.5 g/dL (ref 30.0–36.0)
MCV: 92.7 fL (ref 80.0–100.0)
Monocytes Absolute: 0.8 10*3/uL (ref 0.1–1.0)
Monocytes Relative: 25 %
Neutro Abs: 1.5 10*3/uL — ABNORMAL LOW (ref 1.7–7.7)
Neutrophils Relative %: 49 %
Platelet Count: 279 10*3/uL (ref 150–400)
RBC: 4.54 MIL/uL (ref 4.22–5.81)
RDW: 11.7 % (ref 11.5–15.5)
WBC Count: 3.1 10*3/uL — ABNORMAL LOW (ref 4.0–10.5)
nRBC: 0 % (ref 0.0–0.2)

## 2020-05-10 ENCOUNTER — Ambulatory Visit
Admission: RE | Admit: 2020-05-10 | Discharge: 2020-05-10 | Disposition: A | Discharge status: Home / Self Care | Payer: Medicare Other | Source: Ambulatory Visit | Attending: Radiation Oncology | Admitting: Radiation Oncology

## 2020-05-10 ENCOUNTER — Other Ambulatory Visit: Payer: Self-pay

## 2020-05-10 ENCOUNTER — Other Ambulatory Visit: Payer: Self-pay | Admitting: Radiation Oncology

## 2020-05-10 DIAGNOSIS — C3491 Malignant neoplasm of unspecified part of right bronchus or lung: Secondary | ICD-10-CM | POA: Diagnosis not present

## 2020-05-10 DIAGNOSIS — F17210 Nicotine dependence, cigarettes, uncomplicated: Secondary | ICD-10-CM | POA: Diagnosis not present

## 2020-05-10 DIAGNOSIS — C3411 Malignant neoplasm of upper lobe, right bronchus or lung: Secondary | ICD-10-CM | POA: Diagnosis not present

## 2020-05-10 MED ORDER — HYDROCODONE-ACETAMINOPHEN 7.5-325 MG/15ML PO SOLN: 10.0000 mL | Freq: Four times a day (QID) | ORAL | 0 refills | Status: DC | PRN

## 2020-05-11 ENCOUNTER — Ambulatory Visit
Admission: RE | Admit: 2020-05-11 | Discharge: 2020-05-11 | Disposition: A | Discharge status: Home / Self Care | Payer: Medicare Other | Source: Ambulatory Visit | Attending: Radiation Oncology | Admitting: Radiation Oncology

## 2020-05-11 ENCOUNTER — Other Ambulatory Visit: Payer: Self-pay

## 2020-05-11 DIAGNOSIS — C3411 Malignant neoplasm of upper lobe, right bronchus or lung: Secondary | ICD-10-CM | POA: Diagnosis not present

## 2020-05-11 DIAGNOSIS — C3491 Malignant neoplasm of unspecified part of right bronchus or lung: Secondary | ICD-10-CM | POA: Diagnosis not present

## 2020-05-11 DIAGNOSIS — F17210 Nicotine dependence, cigarettes, uncomplicated: Secondary | ICD-10-CM | POA: Diagnosis not present

## 2020-05-14 ENCOUNTER — Ambulatory Visit
Admission: RE | Admit: 2020-05-14 | Discharge: 2020-05-14 | Disposition: A | Discharge status: Home / Self Care | Payer: Medicare Other | Source: Ambulatory Visit | Attending: Radiation Oncology | Admitting: Radiation Oncology

## 2020-05-14 ENCOUNTER — Other Ambulatory Visit: Payer: Self-pay

## 2020-05-14 ENCOUNTER — Other Ambulatory Visit: Payer: Self-pay | Admitting: Medical Oncology

## 2020-05-14 ENCOUNTER — Inpatient Hospital Stay: Payer: Medicare Other

## 2020-05-14 ENCOUNTER — Encounter: Payer: Self-pay | Admitting: Internal Medicine

## 2020-05-14 ENCOUNTER — Inpatient Hospital Stay: Payer: Medicare Other | Admitting: Nutrition

## 2020-05-14 ENCOUNTER — Inpatient Hospital Stay: Payer: Medicare Other | Admitting: Internal Medicine

## 2020-05-14 VITALS — BP 123/68 | HR 75 | Temp 97.6°F | Resp 14 | Ht 72.0 in | Wt 146.3 lb

## 2020-05-14 DIAGNOSIS — Z5111 Encounter for antineoplastic chemotherapy: Secondary | ICD-10-CM

## 2020-05-14 DIAGNOSIS — Z0000 Encounter for general adult medical examination without abnormal findings: Secondary | ICD-10-CM | POA: Insufficient documentation

## 2020-05-14 DIAGNOSIS — C3491 Malignant neoplasm of unspecified part of right bronchus or lung: Secondary | ICD-10-CM | POA: Diagnosis not present

## 2020-05-14 DIAGNOSIS — F17210 Nicotine dependence, cigarettes, uncomplicated: Secondary | ICD-10-CM | POA: Diagnosis not present

## 2020-05-14 DIAGNOSIS — T451X5A Adverse effect of antineoplastic and immunosuppressive drugs, initial encounter: Secondary | ICD-10-CM | POA: Diagnosis not present

## 2020-05-14 DIAGNOSIS — C3490 Malignant neoplasm of unspecified part of unspecified bronchus or lung: Secondary | ICD-10-CM

## 2020-05-14 DIAGNOSIS — Z87891 Personal history of nicotine dependence: Secondary | ICD-10-CM | POA: Diagnosis not present

## 2020-05-14 DIAGNOSIS — D701 Agranulocytosis secondary to cancer chemotherapy: Secondary | ICD-10-CM | POA: Diagnosis not present

## 2020-05-14 DIAGNOSIS — C3411 Malignant neoplasm of upper lobe, right bronchus or lung: Secondary | ICD-10-CM | POA: Diagnosis not present

## 2020-05-14 LAB — CBC WITH DIFFERENTIAL (CANCER CENTER ONLY)
Abs Immature Granulocytes: 0.09 10*3/uL — ABNORMAL HIGH (ref 0.00–0.07)
Basophils Absolute: 0.1 10*3/uL (ref 0.0–0.1)
Basophils Relative: 1 %
Eosinophils Absolute: 0 10*3/uL (ref 0.0–0.5)
Eosinophils Relative: 1 %
HCT: 42.1 % (ref 39.0–52.0)
Hemoglobin: 14.1 g/dL (ref 13.0–17.0)
Immature Granulocytes: 2 %
Lymphocytes Relative: 8 %
Lymphs Abs: 0.4 10*3/uL — ABNORMAL LOW (ref 0.7–4.0)
MCH: 31.3 pg (ref 26.0–34.0)
MCHC: 33.5 g/dL (ref 30.0–36.0)
MCV: 93.6 fL (ref 80.0–100.0)
Monocytes Absolute: 0.5 10*3/uL (ref 0.1–1.0)
Monocytes Relative: 9 %
Neutro Abs: 3.9 10*3/uL (ref 1.7–7.7)
Neutrophils Relative %: 79 %
Platelet Count: 394 10*3/uL (ref 150–400)
RBC: 4.5 MIL/uL (ref 4.22–5.81)
RDW: 11.9 % (ref 11.5–15.5)
WBC Count: 4.9 10*3/uL (ref 4.0–10.5)
nRBC: 0 % (ref 0.0–0.2)

## 2020-05-14 LAB — CMP (CANCER CENTER ONLY)
ALT: 15 U/L (ref 0–44)
AST: 14 U/L — ABNORMAL LOW (ref 15–41)
Albumin: 3.7 g/dL (ref 3.5–5.0)
Alkaline Phosphatase: 89 U/L (ref 38–126)
Anion gap: 9 (ref 5–15)
BUN: 16 mg/dL (ref 8–23)
CO2: 26 mmol/L (ref 22–32)
Calcium: 9.3 mg/dL (ref 8.9–10.3)
Chloride: 100 mmol/L (ref 98–111)
Creatinine: 0.93 mg/dL (ref 0.61–1.24)
GFR, Estimated: >60 mL/min (ref >60)
Glucose, Bld: 138 mg/dL — ABNORMAL HIGH (ref 70–99)
Potassium: 3.8 mmol/L (ref 3.5–5.1)
Sodium: 135 mmol/L (ref 135–145)
Total Bilirubin: 0.3 mg/dL (ref 0.3–1.2)
Total Protein: 7.3 g/dL (ref 6.5–8.1)

## 2020-05-14 LAB — MAGNESIUM: Magnesium: 1.6 mg/dL — ABNORMAL LOW (ref 1.7–2.4)

## 2020-05-14 MED ORDER — SODIUM CHLORIDE 0.9 % IV SOLN
150.0000 mg | Freq: Once | INTRAVENOUS | Status: AC
  Administered 2020-05-14: 150 mg via INTRAVENOUS
  Filled 2020-05-14: qty 150

## 2020-05-14 MED ORDER — SODIUM CHLORIDE 0.9 % IV SOLN
Freq: Once | INTRAVENOUS | Status: DC
  Filled 2020-05-14: qty 250

## 2020-05-14 MED ORDER — SODIUM CHLORIDE 0.9 % IV SOLN
10.0000 mg | Freq: Once | INTRAVENOUS | Status: AC
  Administered 2020-05-14: 10 mg via INTRAVENOUS
  Filled 2020-05-14: qty 10

## 2020-05-14 MED ORDER — POTASSIUM CHLORIDE IN NACL 20-0.9 MEQ/L-% IV SOLN
Freq: Once | INTRAVENOUS | Status: AC
  Filled 2020-05-14: qty 1000

## 2020-05-14 MED ORDER — PALONOSETRON HCL INJECTION 0.25 MG/5ML
INTRAVENOUS | Status: AC
  Filled 2020-05-14: qty 5

## 2020-05-14 MED ORDER — SODIUM CHLORIDE 0.9 % IV SOLN
100.0000 mg/m2 | Freq: Once | INTRAVENOUS | Status: AC
  Administered 2020-05-14: 190 mg via INTRAVENOUS
  Filled 2020-05-14: qty 9.5

## 2020-05-14 MED ORDER — MAGNESIUM SULFATE 2 GM/50ML IV SOLN
2.0000 g | Freq: Once | INTRAVENOUS | Status: AC
  Administered 2020-05-14: 2 g via INTRAVENOUS

## 2020-05-14 MED ORDER — PALONOSETRON HCL INJECTION 0.25 MG/5ML
0.2500 mg | Freq: Once | INTRAVENOUS | Status: AC
  Administered 2020-05-14: 0.25 mg via INTRAVENOUS

## 2020-05-14 MED ORDER — SODIUM CHLORIDE 0.9 % IV SOLN
77.5000 mg/m2 | Freq: Once | INTRAVENOUS | Status: AC
  Administered 2020-05-14: 150 mg via INTRAVENOUS
  Filled 2020-05-14: qty 150

## 2020-05-14 MED ORDER — MAGNESIUM SULFATE 2 GM/50ML IV SOLN
INTRAVENOUS | Status: AC
  Filled 2020-05-14: qty 50

## 2020-05-14 NOTE — Progress Notes (Signed)
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 3) Suspicious left renal lesion   PRIOR THERAPY: None  CURRENT THERAPY: Systemic chemotherapy/radiation with cisplatin 80 mg per metered squared on day 1 and etoposide 100 mg per metered squared on days 1, 2, and 3 IV every 3 weeks with Cosela support.  Status post 1 cycle.  First dose on 04/20/20.  Status post 1 cycle.  INTERVAL HISTORY: Ian Hart 70 y.o. male returns to the clinic today for follow-up visit.  The patient is feeling fine today with no concerning complaints except for fatigue after the first cycle of the chemotherapy.  He recovered well.  He lost few pounds secondary to odynophagia from the radiation therapy.  He denied having any current chest pain, shortness of breath, cough or hemoptysis.  He denied having any fever or chills.  He has no nausea, vomiting, diarrhea or constipation.  He has no headache or visual changes.  The patient is here today for evaluation before starting cycle #2 of his treatment.  MEDICAL HISTORY: Past Medical History:  Diagnosis Date  . History of chicken pox   . History of shingles 04/2010  . Lung cancer ([facility]) 02/2020    ALLERGIES:  is allergic to acyclovir and related.  MEDICATIONS:  Current Outpatient Medications  Medication Sig Dispense Refill  . ascorbic acid (VITAMIN C) 500 MG tablet Take 500 mg by mouth daily.    Marland Kitchen buPROPion (WELLBUTRIN XL) 150 MG 24 hr tablet Take 1 tablet (150 mg total) by mouth daily. 60 tablet 5  . HYDROcodone-acetaminophen (HYCET) 7.5-325 mg/15 ml solution Take 10 mLs by mouth every 6 (six) hours as needed for moderate pain. 473 mL 0  .  Multiple Vitamin (MULTIVITAMIN) tablet Take 1 tablet by mouth daily.    . nicotine (NICODERM CQ - DOSED IN MG/24 HOURS) 14 mg/24hr patch Place 1 patch (14 mg total) onto the skin daily. (Patient not taking: Reported on 05/02/2020) 14 patch 0  . nicotine (NICODERM CQ - DOSED IN MG/24 HOURS) 21 mg/24hr patch Place 1 patch (21 mg total) onto the skin daily. 14 patch 0  . omeprazole (PRILOSEC) 20 MG capsule Take 1 capsule (20 mg total) by mouth daily. 30 capsule 1  . prochlorperazine (COMPAZINE) 10 MG tablet Take 1 tablet (10 mg total) by mouth every 6 (six) hours as needed. 30 tablet 2  . sucralfate (CARAFATE) 1 g tablet Take 1 tablet (1 g total) by mouth 4 (four) times daily. Dissolve each tablet in 15 cc water before use. 120 tablet 2  . umeclidinium-vilanterol (ANORO ELLIPTA) 62.5-25 MCG/INH AEPB Inhale 1 puff into the lungs daily. 3 each 2  . vitamin B-12 (CYANOCOBALAMIN) 1000 MCG tablet Take 1,000 mcg by mouth daily.     No current facility-administered medications for this visit.    SURGICAL HISTORY:  Past Surgical History:  Procedure Laterality Date  . BRONCHIAL BIOPSY  03/22/2020   Procedure: BRONCHIAL BIOPSIES;  Surgeon: Garner Nash, DO;  Location: [facility];  Service: Pulmonary;;  . BRONCHIAL BRUSHINGS  03/22/2020   Procedure: BRONCHIAL BRUSHINGS;  Surgeon: Garner Nash, DO;  Location: [facility];  Service: Pulmonary;;  . BRONCHIAL NEEDLE ASPIRATION BIOPSY  03/22/2020   Procedure: BRONCHIAL NEEDLE ASPIRATION BIOPSIES;  Surgeon: Garner Nash, DO;  Location: [facility];  Service: Pulmonary;;  . BRONCHIAL WASHINGS  03/22/2020   Procedure: BRONCHIAL WASHINGS;  Surgeon: Garner Nash, DO;  Location: [facility];  Service: Pulmonary;;  . FIDUCIAL MARKER PLACEMENT  03/22/2020   Procedure: FIDUCIAL MARKER PLACEMENT;  Surgeon: Garner Nash, DO;  Location: [facility];  Service: Pulmonary;;  . NO PAST SURGERIES    . VIDEO BRONCHOSCOPY WITH ENDOBRONCHIAL NAVIGATION N/A  03/22/2020   Procedure: VIDEO BRONCHOSCOPY WITH ENDOBRONCHIAL NAVIGATION;  Surgeon: Garner Nash, DO;  Location: [facility];  Service: Pulmonary;  Laterality: N/A;  . VIDEO BRONCHOSCOPY WITH ENDOBRONCHIAL ULTRASOUND N/A 03/22/2020   Procedure: VIDEO BRONCHOSCOPY WITH ENDOBRONCHIAL ULTRASOUND;  Surgeon: Garner Nash, DO;  Location: [facility];  Service: Pulmonary;  Laterality: N/A;    REVIEW OF SYSTEMS:  A comprehensive review of systems was negative except for: Constitutional: positive for fatigue and weight loss Gastrointestinal: positive for odynophagia   PHYSICAL EXAMINATION: General appearance: alert, cooperative, fatigued and no distress Head: Normocephalic, without obvious abnormality, atraumatic Neck: no adenopathy, no JVD, supple, symmetrical, trachea midline and thyroid not enlarged, symmetric, no tenderness/mass/nodules Lymph nodes: Cervical, supraclavicular, and axillary nodes normal. Resp: clear to auscultation bilaterally Back: symmetric, no curvature. ROM normal. No CVA tenderness. Cardio: regular rate and rhythm, S1, S2 normal, no murmur, click, rub or gallop GI: soft, non-tender; bowel sounds normal; no masses,  no organomegaly Extremities: extremities normal, atraumatic, no cyanosis or edema  ECOG PERFORMANCE STATUS: 1 - Symptomatic but completely ambulatory  Blood pressure 123/68, pulse 75, temperature 97.6 F (36.4 C), temperature source Tympanic, resp. rate 14, height 6' (1.829 m), weight 146 lb 4.8 oz (66.4 kg), SpO2 100 %.  LABORATORY DATA: Lab Results  Component Value Date   WBC 4.9 05/14/2020   HGB 14.1 05/14/2020   HCT 42.1 05/14/2020   MCV 93.6 05/14/2020   PLT 394 05/14/2020      Chemistry      Component Value Date/Time   NA 135 05/14/2020 0758   K 3.8 05/14/2020 0758   CL 100 05/14/2020 0758   CO2 26 05/14/2020 0758   BUN 16 05/14/2020 0758   CREATININE 0.93 05/14/2020 0758   CREATININE 0.86 11/12/2011 0917      Component Value  Date/Time   CALCIUM 9.3 05/14/2020 0758   ALKPHOS 89 05/14/2020 0758   AST 14 (L) 05/14/2020 0758   ALT 15 05/14/2020 0758   BILITOT 0.3 05/14/2020 0758       RADIOGRAPHIC STUDIES: No results found.  ASSESSMENT AND PLAN: This is a very pleasant 70 years old white male recently diagnosed with poorly differentiated carcinoma most consistent with adenocarcinoma of the right upper lobe nodule in addition to small cell lung cancer in the station 7 lymph node and suspicious left renal lesion diagnosed in December 2021. The patient is currently undergoing a course of systemic chemotherapy with cisplatin 80 mg/M2 on day 1 and 2 etoposide 100 mg/M2 on days 1, 2 and 3 concurrent with radiation.  He is status post 1 cycle.  He tolerated the first cycle of his treatment well.  He continues to complain of odynophagia secondary to the radiotherapy and he is currently on hydrocodone and Carafate. I recommended for the patient to proceed with cycle #2 today as planned. He will come back for follow-up visit in 3 weeks for evaluation before starting cycle #3. The patient was advised to call immediately if he  has any other concerning symptoms in the interval. The patient voices understanding of current disease status and treatment options and is in agreement with the current care plan.  All questions were answered. The patient knows to call the clinic with any problems, questions or concerns. We can certainly see the patient much sooner if necessary.  Disclaimer: This note was dictated with voice recognition software. Similar sounding words can inadvertently be transcribed and may not be corrected upon review.

## 2020-05-14 NOTE — Patient Instructions (Signed)
[facility] Discharge Instructions for Patients Receiving Chemotherapy  Today you received the following chemotherapy agents Cisplatin, Etoposide  To help prevent nausea and vomiting after your treatment, we encourage you to take your nausea medication as directed.  If you develop nausea and vomiting that is not controlled by your nausea medication, call the clinic.   BELOW ARE SYMPTOMS THAT SHOULD BE REPORTED IMMEDIATELY:  *FEVER GREATER THAN 100.5 F  *CHILLS WITH OR WITHOUT FEVER  NAUSEA AND VOMITING THAT IS NOT CONTROLLED WITH YOUR NAUSEA MEDICATION  *UNUSUAL SHORTNESS OF BREATH  *UNUSUAL BRUISING OR BLEEDING  TENDERNESS IN MOUTH AND THROAT WITH OR WITHOUT PRESENCE OF ULCERS  *URINARY PROBLEMS  *BOWEL PROBLEMS  UNUSUAL RASH Items with * indicate a potential emergency and should be followed up as soon as possible.  Feel free to call the clinic should you have any questions or concerns. The clinic phone number is [phone].  Please show the [facility] at check-in to the Emergency Department and triage nurse.

## 2020-05-14 NOTE — Progress Notes (Signed)
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 Weight decreased and documented as 146.3 pounds January 31 down from 161 pounds January 8. Patient continues to have a odynophagia.  He has both pain medication and Carafate. He is starting to find foods he better tolerates. He is chopping foods and using gravies and sauces as needed.  Nutrition diagnosis: Food and nutrition related knowledge deficit improved.  Intervention: Continue to monitor textures and temperatures of foods for easier swallowing. Recommended small frequent meals and snacks with adequate calories and protein to minimize further weight loss. Continue oral nutrition supplements as tolerated.  Patient prefers Carnation breakfast essentials.  Monitoring, evaluation, goals: Patient will tolerate adequate calories and protein to minimize weight loss.  Next visit: No follow-up has been scheduled.  Patient has my contact information for questions or concerns.  I am available to follow as needed.  **Disclaimer: This note was dictated with voice recognition software. Similar sounding words can inadvertently be transcribed and this note may contain transcription errors which may not have been corrected upon publication of note.**

## 2020-05-15 ENCOUNTER — Inpatient Hospital Stay (HOSPITAL_BASED_OUTPATIENT_CLINIC_OR_DEPARTMENT_OTHER): Payer: Medicare Other | Admitting: Medical

## 2020-05-15 ENCOUNTER — Inpatient Hospital Stay: Payer: Medicare Other | Attending: Physician Assistant

## 2020-05-15 ENCOUNTER — Other Ambulatory Visit: Payer: Self-pay

## 2020-05-15 ENCOUNTER — Ambulatory Visit
Admission: RE | Admit: 2020-05-15 | Discharge: 2020-05-15 | Disposition: A | Discharge status: Home / Self Care | Payer: Medicare Other | Source: Ambulatory Visit | Attending: Radiation Oncology | Admitting: Radiation Oncology

## 2020-05-15 DIAGNOSIS — Z5111 Encounter for antineoplastic chemotherapy: Secondary | ICD-10-CM | POA: Insufficient documentation

## 2020-05-15 DIAGNOSIS — C3411 Malignant neoplasm of upper lobe, right bronchus or lung: Secondary | ICD-10-CM | POA: Insufficient documentation

## 2020-05-15 DIAGNOSIS — T8090XA Unspecified complication following infusion and therapeutic injection, initial encounter: Secondary | ICD-10-CM | POA: Diagnosis not present

## 2020-05-15 DIAGNOSIS — D701 Agranulocytosis secondary to cancer chemotherapy: Secondary | ICD-10-CM | POA: Diagnosis not present

## 2020-05-15 DIAGNOSIS — F17210 Nicotine dependence, cigarettes, uncomplicated: Secondary | ICD-10-CM | POA: Diagnosis not present

## 2020-05-15 DIAGNOSIS — C3491 Malignant neoplasm of unspecified part of right bronchus or lung: Secondary | ICD-10-CM | POA: Diagnosis not present

## 2020-05-15 DIAGNOSIS — Z5189 Encounter for other specified aftercare: Secondary | ICD-10-CM | POA: Insufficient documentation

## 2020-05-15 MED ORDER — FAMOTIDINE IN NACL 20-0.9 MG/50ML-% IV SOLN
20.0000 mg | Freq: Once | INTRAVENOUS | Status: AC
  Administered 2020-05-15: 20 mg via INTRAVENOUS

## 2020-05-15 MED ORDER — FAMOTIDINE IN NACL 20-0.9 MG/50ML-% IV SOLN
INTRAVENOUS | Status: AC
  Filled 2020-05-15: qty 50

## 2020-05-15 MED ORDER — SODIUM CHLORIDE 0.9 % IV SOLN
10.0000 mg | Freq: Once | INTRAVENOUS | Status: AC
  Administered 2020-05-15: 10 mg via INTRAVENOUS
  Filled 2020-05-15: qty 10

## 2020-05-15 MED ORDER — SODIUM CHLORIDE 0.9 % IV SOLN
100.0000 mg/m2 | Freq: Once | INTRAVENOUS | Status: AC
  Administered 2020-05-15: 190 mg via INTRAVENOUS
  Filled 2020-05-15: qty 9.5

## 2020-05-15 MED ORDER — SODIUM CHLORIDE 0.9 % IV SOLN
Freq: Once | INTRAVENOUS | Status: AC
  Filled 2020-05-15: qty 250

## 2020-05-15 NOTE — Patient Instructions (Signed)
[facility] Discharge Instructions for Patients Receiving Chemotherapy  Today you received the following chemotherapy agents: Etoposide  To help prevent nausea and vomiting after your treatment, we encourage you to take your nausea medication as directed.  If you develop nausea and vomiting that is not controlled by your nausea medication, call the clinic.   BELOW ARE SYMPTOMS THAT SHOULD BE REPORTED IMMEDIATELY:  *FEVER GREATER THAN 100.5 F  *CHILLS WITH OR WITHOUT FEVER  NAUSEA AND VOMITING THAT IS NOT CONTROLLED WITH YOUR NAUSEA MEDICATION  *UNUSUAL SHORTNESS OF BREATH  *UNUSUAL BRUISING OR BLEEDING  TENDERNESS IN MOUTH AND THROAT WITH OR WITHOUT PRESENCE OF ULCERS  *URINARY PROBLEMS  *BOWEL PROBLEMS  UNUSUAL RASH Items with * indicate a potential emergency and should be followed up as soon as possible.  Feel free to call the clinic should you have any questions or concerns. The clinic phone number is [phone].  Please show the [place] at check-in to the Emergency Department and triage nurse.

## 2020-05-15 NOTE — Progress Notes (Signed)
At start of etoposide infusion, patient turned red and reported feeling "hot and ill all over body." Per patient feeling started in abdomen and rising up to his head. Infusion paused, saline flushed and Sandi Mealy, PA called to chairside. Verbal order for 20mg  pepcid IV given and administered. An additional ten minutes of normal saline bolus given post pepcid completion. Etoposide then resumed per PA direction. Patient completed the rest of the infusion without issue. IV catheter removed and intact.

## 2020-05-16 ENCOUNTER — Telehealth: Payer: Self-pay | Admitting: Internal Medicine

## 2020-05-16 ENCOUNTER — Inpatient Hospital Stay: Payer: Medicare Other

## 2020-05-16 ENCOUNTER — Ambulatory Visit
Admission: RE | Admit: 2020-05-16 | Discharge: 2020-05-16 | Disposition: A | Discharge status: Home / Self Care | Payer: Medicare Other | Source: Ambulatory Visit | Attending: Radiation Oncology | Admitting: Radiation Oncology

## 2020-05-16 ENCOUNTER — Other Ambulatory Visit: Payer: Self-pay

## 2020-05-16 VITALS — BP 128/58 | HR 78 | Temp 97.6°F | Resp 16

## 2020-05-16 DIAGNOSIS — Z5111 Encounter for antineoplastic chemotherapy: Secondary | ICD-10-CM | POA: Diagnosis not present

## 2020-05-16 DIAGNOSIS — C3411 Malignant neoplasm of upper lobe, right bronchus or lung: Secondary | ICD-10-CM | POA: Diagnosis not present

## 2020-05-16 DIAGNOSIS — Z5189 Encounter for other specified aftercare: Secondary | ICD-10-CM | POA: Diagnosis not present

## 2020-05-16 DIAGNOSIS — F17210 Nicotine dependence, cigarettes, uncomplicated: Secondary | ICD-10-CM | POA: Diagnosis not present

## 2020-05-16 DIAGNOSIS — C3491 Malignant neoplasm of unspecified part of right bronchus or lung: Secondary | ICD-10-CM | POA: Diagnosis not present

## 2020-05-16 DIAGNOSIS — D701 Agranulocytosis secondary to cancer chemotherapy: Secondary | ICD-10-CM | POA: Diagnosis not present

## 2020-05-16 MED ORDER — SODIUM CHLORIDE 0.9 % IV SOLN
100.0000 mg/m2 | Freq: Once | INTRAVENOUS | Status: AC
  Administered 2020-05-16: 190 mg via INTRAVENOUS
  Filled 2020-05-16: qty 9.5

## 2020-05-16 MED ORDER — SODIUM CHLORIDE 0.9 % IV SOLN
10.0000 mg | Freq: Once | INTRAVENOUS | Status: AC
  Administered 2020-05-16: 10 mg via INTRAVENOUS
  Filled 2020-05-16: qty 10

## 2020-05-16 MED ORDER — FAMOTIDINE IN NACL 20-0.9 MG/50ML-% IV SOLN
20.0000 mg | Freq: Once | INTRAVENOUS | Status: AC
  Administered 2020-05-16: 20 mg via INTRAVENOUS

## 2020-05-16 MED ORDER — SODIUM CHLORIDE 0.9 % IV SOLN
Freq: Once | INTRAVENOUS | Status: AC
  Filled 2020-05-16: qty 250

## 2020-05-16 MED ORDER — FAMOTIDINE IN NACL 20-0.9 MG/50ML-% IV SOLN
INTRAVENOUS | Status: AC
  Filled 2020-05-16: qty 50

## 2020-05-16 NOTE — Telephone Encounter (Signed)
Scheduled appts per 1/31 los. Pt stated he would refer to mychart for appts.

## 2020-05-16 NOTE — Patient Instructions (Signed)
[facility] Discharge Instructions for Patients Receiving Chemotherapy  Today you received the following chemotherapy agents: Etoposide   To help prevent nausea and vomiting after your treatment, we encourage you to take your nausea medication  as prescribed.    If you develop nausea and vomiting that is not controlled by your nausea medication, call the clinic.   BELOW ARE SYMPTOMS THAT SHOULD BE REPORTED IMMEDIATELY:  *FEVER GREATER THAN 100.5 F  *CHILLS WITH OR WITHOUT FEVER  NAUSEA AND VOMITING THAT IS NOT CONTROLLED WITH YOUR NAUSEA MEDICATION  *UNUSUAL SHORTNESS OF BREATH  *UNUSUAL BRUISING OR BLEEDING  TENDERNESS IN MOUTH AND THROAT WITH OR WITHOUT PRESENCE OF ULCERS  *URINARY PROBLEMS  *BOWEL PROBLEMS  UNUSUAL RASH Items with * indicate a potential emergency and should be followed up as soon as possible.  Feel free to call the clinic should you have any questions or concerns. The clinic phone number is [phone].  Please show the [place] at check-in to the Emergency Department and triage nurse.

## 2020-05-16 NOTE — Addendum Note (Signed)
Addended by: Tora Kindred on: 05/16/2020 04:31 PM   Modules accepted: Orders

## 2020-05-17 ENCOUNTER — Ambulatory Visit
Admission: RE | Admit: 2020-05-17 | Discharge: 2020-05-17 | Disposition: A | Discharge status: Home / Self Care | Payer: Medicare Other | Source: Ambulatory Visit | Attending: Radiation Oncology | Admitting: Radiation Oncology

## 2020-05-17 DIAGNOSIS — C3491 Malignant neoplasm of unspecified part of right bronchus or lung: Secondary | ICD-10-CM | POA: Diagnosis not present

## 2020-05-17 DIAGNOSIS — F17210 Nicotine dependence, cigarettes, uncomplicated: Secondary | ICD-10-CM | POA: Diagnosis not present

## 2020-05-17 DIAGNOSIS — C3411 Malignant neoplasm of upper lobe, right bronchus or lung: Secondary | ICD-10-CM | POA: Diagnosis not present

## 2020-05-18 ENCOUNTER — Other Ambulatory Visit: Payer: Self-pay

## 2020-05-18 ENCOUNTER — Ambulatory Visit
Admission: RE | Admit: 2020-05-18 | Discharge: 2020-05-18 | Disposition: A | Discharge status: Home / Self Care | Payer: Medicare Other | Source: Ambulatory Visit | Attending: Radiation Oncology | Admitting: Radiation Oncology

## 2020-05-18 DIAGNOSIS — C3411 Malignant neoplasm of upper lobe, right bronchus or lung: Secondary | ICD-10-CM | POA: Diagnosis not present

## 2020-05-18 DIAGNOSIS — F17210 Nicotine dependence, cigarettes, uncomplicated: Secondary | ICD-10-CM | POA: Diagnosis not present

## 2020-05-18 DIAGNOSIS — C3491 Malignant neoplasm of unspecified part of right bronchus or lung: Secondary | ICD-10-CM | POA: Diagnosis not present

## 2020-05-18 NOTE — Progress Notes (Signed)
    DATE:  05/15/2020                                          X  CHEMO/IMMUNOTHERAPY REACTION           MD:  Dr. Fanny Bien. Mohamed   AGENT/BLOOD PRODUCT RECEIVING TODAY:               Etoposide (cycle 2, day 2)   AGENT/BLOOD PRODUCT RECEIVING IMMEDIATELY PRIOR TO REACTION:           Etoposide   VS: BP:      98/56   P:        85       SPO2:        99% on room air                  REACTION(S):          Flushing facial erythema   PREMEDS:       Decadron 10 mg IV   INTERVENTION: Etoposide was paused and the patient was given Pepcid 20 mg IV x1.   Review of Systems  Review of Systems  Constitutional: Negative for chills, diaphoresis and fever.  HENT: Negative for trouble swallowing and voice change.   Respiratory: Negative for cough, chest tightness, shortness of breath and wheezing.   Cardiovascular: Negative for chest pain and palpitations.  Gastrointestinal: Negative for abdominal pain, constipation, diarrhea, nausea and vomiting.  Musculoskeletal: Negative for back pain and myalgias.  Skin: Positive for color change.       Flushing and facial erythema  Neurological: Negative for dizziness, light-headedness and headaches.     Physical Exam  Physical Exam Constitutional:      General: He is not in acute distress.    Appearance: He is not diaphoretic.  HENT:     Head: Normocephalic and atraumatic.  Cardiovascular:     Rate and Rhythm: Normal rate and regular rhythm.     Heart sounds: Normal heart sounds. No murmur heard. No friction rub. No gallop.   Pulmonary:     Effort: Pulmonary effort is normal. No respiratory distress.     Breath sounds: Normal breath sounds. No wheezing or rales.  Skin:    General: Skin is warm and dry.     Findings: No erythema or rash.  Neurological:     Mental Status: He is alert.  Psychiatric:        Mood and Affect: Mood normal.        Behavior: Behavior normal.        Thought Content: Thought content normal.        Judgment: Judgment  normal.     OUTCOME:                 The patient's symptoms resolved after etoposide was paused and he was given Pepcid 20 mg IV x1.  Etoposide was restarted and completed without any further issues or concerns.   Sandi Mealy, MHS, PA-C

## 2020-05-21 ENCOUNTER — Telehealth: Payer: Self-pay | Admitting: Physician Assistant

## 2020-05-21 ENCOUNTER — Other Ambulatory Visit: Payer: Self-pay

## 2020-05-21 ENCOUNTER — Ambulatory Visit
Admission: RE | Admit: 2020-05-21 | Discharge: 2020-05-21 | Disposition: A | Discharge status: Home / Self Care | Payer: Medicare Other | Source: Ambulatory Visit | Attending: Radiation Oncology | Admitting: Radiation Oncology

## 2020-05-21 ENCOUNTER — Inpatient Hospital Stay: Payer: Medicare Other

## 2020-05-21 DIAGNOSIS — Z5189 Encounter for other specified aftercare: Secondary | ICD-10-CM | POA: Diagnosis not present

## 2020-05-21 DIAGNOSIS — C3411 Malignant neoplasm of upper lobe, right bronchus or lung: Secondary | ICD-10-CM | POA: Diagnosis not present

## 2020-05-21 DIAGNOSIS — C3490 Malignant neoplasm of unspecified part of unspecified bronchus or lung: Secondary | ICD-10-CM

## 2020-05-21 DIAGNOSIS — D701 Agranulocytosis secondary to cancer chemotherapy: Secondary | ICD-10-CM | POA: Diagnosis not present

## 2020-05-21 DIAGNOSIS — F17210 Nicotine dependence, cigarettes, uncomplicated: Secondary | ICD-10-CM | POA: Diagnosis not present

## 2020-05-21 DIAGNOSIS — Z5111 Encounter for antineoplastic chemotherapy: Secondary | ICD-10-CM | POA: Diagnosis not present

## 2020-05-21 DIAGNOSIS — C3491 Malignant neoplasm of unspecified part of right bronchus or lung: Secondary | ICD-10-CM | POA: Diagnosis not present

## 2020-05-21 LAB — CBC WITH DIFFERENTIAL (CANCER CENTER ONLY)
Abs Immature Granulocytes: 0.05 10*3/uL (ref 0.00–0.07)
Basophils Absolute: 0.1 10*3/uL (ref 0.0–0.1)
Basophils Relative: 1 %
Eosinophils Absolute: 0 10*3/uL (ref 0.0–0.5)
Eosinophils Relative: 0 %
HCT: 40.8 % (ref 39.0–52.0)
Hemoglobin: 13.9 g/dL (ref 13.0–17.0)
Immature Granulocytes: 1 %
Lymphocytes Relative: 7 %
Lymphs Abs: 0.3 10*3/uL — ABNORMAL LOW (ref 0.7–4.0)
MCH: 31.2 pg (ref 26.0–34.0)
MCHC: 34.1 g/dL (ref 30.0–36.0)
MCV: 91.7 fL (ref 80.0–100.0)
Monocytes Absolute: 0 10*3/uL — ABNORMAL LOW (ref 0.1–1.0)
Monocytes Relative: 1 %
Neutro Abs: 3.2 10*3/uL (ref 1.7–7.7)
Neutrophils Relative %: 90 %
Platelet Count: 179 10*3/uL (ref 150–400)
RBC: 4.45 MIL/uL (ref 4.22–5.81)
RDW: 12 % (ref 11.5–15.5)
WBC Count: 3.6 10*3/uL — ABNORMAL LOW (ref 4.0–10.5)
nRBC: 0 % (ref 0.0–0.2)

## 2020-05-21 LAB — CMP (CANCER CENTER ONLY)
ALT: 27 U/L (ref 0–44)
AST: 16 U/L (ref 15–41)
Albumin: 3.8 g/dL (ref 3.5–5.0)
Alkaline Phosphatase: 75 U/L (ref 38–126)
Anion gap: 11 (ref 5–15)
BUN: 27 mg/dL — ABNORMAL HIGH (ref 8–23)
CO2: 25 mmol/L (ref 22–32)
Calcium: 9.1 mg/dL (ref 8.9–10.3)
Chloride: 97 mmol/L — ABNORMAL LOW (ref 98–111)
Creatinine: 1.36 mg/dL — ABNORMAL HIGH (ref 0.61–1.24)
GFR, Estimated: 56 mL/min — ABNORMAL LOW (ref >60)
Glucose, Bld: 88 mg/dL (ref 70–99)
Potassium: 4 mmol/L (ref 3.5–5.1)
Sodium: 133 mmol/L — ABNORMAL LOW (ref 135–145)
Total Bilirubin: 0.6 mg/dL (ref 0.3–1.2)
Total Protein: 7 g/dL (ref 6.5–8.1)

## 2020-05-21 NOTE — Telephone Encounter (Signed)
The patient's labs today show a slight increase in his creatinine. I called the patient to encourage him to increase his fluid intake. He expressed understanding. We will continue to monitor his labs weekly.

## 2020-05-22 ENCOUNTER — Ambulatory Visit
Admission: RE | Admit: 2020-05-22 | Discharge: 2020-05-22 | Disposition: A | Discharge status: Home / Self Care | Payer: Medicare Other | Source: Ambulatory Visit | Attending: Radiation Oncology | Admitting: Radiation Oncology

## 2020-05-22 ENCOUNTER — Other Ambulatory Visit: Payer: Self-pay

## 2020-05-22 DIAGNOSIS — C3491 Malignant neoplasm of unspecified part of right bronchus or lung: Secondary | ICD-10-CM | POA: Diagnosis not present

## 2020-05-22 DIAGNOSIS — C3411 Malignant neoplasm of upper lobe, right bronchus or lung: Secondary | ICD-10-CM | POA: Diagnosis not present

## 2020-05-22 DIAGNOSIS — F17210 Nicotine dependence, cigarettes, uncomplicated: Secondary | ICD-10-CM | POA: Diagnosis not present

## 2020-05-23 ENCOUNTER — Ambulatory Visit
Admission: RE | Admit: 2020-05-23 | Discharge: 2020-05-23 | Disposition: A | Discharge status: Home / Self Care | Payer: Medicare Other | Source: Ambulatory Visit | Attending: Radiation Oncology | Admitting: Radiation Oncology

## 2020-05-23 ENCOUNTER — Other Ambulatory Visit: Payer: Self-pay

## 2020-05-23 DIAGNOSIS — C3411 Malignant neoplasm of upper lobe, right bronchus or lung: Secondary | ICD-10-CM | POA: Diagnosis not present

## 2020-05-23 DIAGNOSIS — C3491 Malignant neoplasm of unspecified part of right bronchus or lung: Secondary | ICD-10-CM | POA: Diagnosis not present

## 2020-05-23 DIAGNOSIS — F17210 Nicotine dependence, cigarettes, uncomplicated: Secondary | ICD-10-CM | POA: Diagnosis not present

## 2020-05-24 ENCOUNTER — Ambulatory Visit
Admission: RE | Admit: 2020-05-24 | Discharge: 2020-05-24 | Disposition: A | Discharge status: Home / Self Care | Payer: Medicare Other | Source: Ambulatory Visit | Attending: Radiation Oncology | Admitting: Radiation Oncology

## 2020-05-24 ENCOUNTER — Encounter: Payer: Self-pay | Admitting: Radiation Oncology

## 2020-05-24 DIAGNOSIS — C3411 Malignant neoplasm of upper lobe, right bronchus or lung: Secondary | ICD-10-CM | POA: Diagnosis not present

## 2020-05-24 DIAGNOSIS — F17210 Nicotine dependence, cigarettes, uncomplicated: Secondary | ICD-10-CM | POA: Diagnosis not present

## 2020-05-24 DIAGNOSIS — C3491 Malignant neoplasm of unspecified part of right bronchus or lung: Secondary | ICD-10-CM | POA: Diagnosis not present

## 2020-05-25 ENCOUNTER — Other Ambulatory Visit: Payer: Self-pay

## 2020-05-25 ENCOUNTER — Telehealth: Payer: Self-pay | Admitting: Family

## 2020-05-25 ENCOUNTER — Ambulatory Visit
Admission: RE | Admit: 2020-05-25 | Discharge: 2020-05-25 | Disposition: A | Discharge status: Home / Self Care | Payer: Medicare Other | Source: Ambulatory Visit | Attending: Radiation Oncology | Admitting: Radiation Oncology

## 2020-05-25 DIAGNOSIS — F17210 Nicotine dependence, cigarettes, uncomplicated: Secondary | ICD-10-CM | POA: Diagnosis not present

## 2020-05-25 DIAGNOSIS — C3411 Malignant neoplasm of upper lobe, right bronchus or lung: Secondary | ICD-10-CM | POA: Diagnosis not present

## 2020-05-25 DIAGNOSIS — C3491 Malignant neoplasm of unspecified part of right bronchus or lung: Secondary | ICD-10-CM | POA: Diagnosis not present

## 2020-05-25 NOTE — Telephone Encounter (Signed)
Medication: nicotine (NICODERM CQ - DOSED IN MG/24 HOURS) 21 mg/24hr patch   Has the patient contacted their pharmacy? No. (If no, request that the patient contact the pharmacy for the refill.) (If yes, when and what did the pharmacy advise?)  Preferred Pharmacy (with phone number or street name):  [facility] [place] - [address] Phone:  [phone]  Fax:  [phone]       Agent: Please be advised that RX refills may take up to 3 business days. We ask that you follow-up with your pharmacy.

## 2020-05-26 ENCOUNTER — Other Ambulatory Visit: Payer: Self-pay | Admitting: Radiation Oncology

## 2020-05-26 MED ORDER — FENTANYL 12 MCG/HR TD PT72: 1.0000 | MEDICATED_PATCH | TRANSDERMAL | 0 refills | Status: DC

## 2020-05-26 MED ORDER — NICOTINE 21 MG/24HR TD PT24: 21.0000 mg | MEDICATED_PATCH | Freq: Every day | TRANSDERMAL | 0 refills | Status: DC

## 2020-05-28 ENCOUNTER — Inpatient Hospital Stay: Payer: Medicare Other

## 2020-05-28 ENCOUNTER — Other Ambulatory Visit: Payer: Self-pay

## 2020-05-28 ENCOUNTER — Other Ambulatory Visit: Payer: Self-pay | Admitting: Physician Assistant

## 2020-05-28 ENCOUNTER — Other Ambulatory Visit: Payer: Self-pay | Admitting: Internal Medicine

## 2020-05-28 ENCOUNTER — Telehealth: Payer: Self-pay | Admitting: Internal Medicine

## 2020-05-28 ENCOUNTER — Ambulatory Visit
Admission: RE | Admit: 2020-05-28 | Discharge: 2020-05-28 | Disposition: A | Discharge status: Home / Self Care | Payer: Medicare Other | Source: Ambulatory Visit | Attending: Radiation Oncology | Admitting: Radiation Oncology

## 2020-05-28 ENCOUNTER — Telehealth: Payer: Self-pay

## 2020-05-28 VITALS — BP 130/65 | HR 100 | Temp 98.9°F | Resp 18

## 2020-05-28 DIAGNOSIS — T451X5A Adverse effect of antineoplastic and immunosuppressive drugs, initial encounter: Secondary | ICD-10-CM

## 2020-05-28 DIAGNOSIS — Z5111 Encounter for antineoplastic chemotherapy: Secondary | ICD-10-CM | POA: Diagnosis not present

## 2020-05-28 DIAGNOSIS — C3491 Malignant neoplasm of unspecified part of right bronchus or lung: Secondary | ICD-10-CM | POA: Diagnosis not present

## 2020-05-28 DIAGNOSIS — F17210 Nicotine dependence, cigarettes, uncomplicated: Secondary | ICD-10-CM | POA: Diagnosis not present

## 2020-05-28 DIAGNOSIS — D701 Agranulocytosis secondary to cancer chemotherapy: Secondary | ICD-10-CM | POA: Diagnosis not present

## 2020-05-28 DIAGNOSIS — C3411 Malignant neoplasm of upper lobe, right bronchus or lung: Secondary | ICD-10-CM | POA: Diagnosis not present

## 2020-05-28 DIAGNOSIS — C3490 Malignant neoplasm of unspecified part of unspecified bronchus or lung: Secondary | ICD-10-CM

## 2020-05-28 DIAGNOSIS — Z5189 Encounter for other specified aftercare: Secondary | ICD-10-CM | POA: Diagnosis not present

## 2020-05-28 LAB — CBC WITH DIFFERENTIAL (CANCER CENTER ONLY)
Abs Immature Granulocytes: 0 10*3/uL (ref 0.00–0.07)
Basophils Absolute: 0 10*3/uL (ref 0.0–0.1)
Basophils Relative: 2 %
Eosinophils Absolute: 0 10*3/uL (ref 0.0–0.5)
Eosinophils Relative: 5 %
HCT: 35.6 % — ABNORMAL LOW (ref 39.0–52.0)
Hemoglobin: 12 g/dL — ABNORMAL LOW (ref 13.0–17.0)
Lymphocytes Relative: 57 %
Lymphs Abs: 0.1 10*3/uL — ABNORMAL LOW (ref 0.7–4.0)
MCH: 30.8 pg (ref 26.0–34.0)
MCHC: 33.7 g/dL (ref 30.0–36.0)
MCV: 91.5 fL (ref 80.0–100.0)
Monocytes Absolute: 0 10*3/uL — ABNORMAL LOW (ref 0.1–1.0)
Monocytes Relative: 24 %
Neutro Abs: 0 10*3/uL — CL (ref 1.7–7.7)
Neutrophils Relative %: 12 %
Platelet Count: 20 10*3/uL — ABNORMAL LOW (ref 150–400)
RBC: 3.89 MIL/uL — ABNORMAL LOW (ref 4.22–5.81)
RDW: 11.8 % (ref 11.5–15.5)
WBC Count: 0.2 10*3/uL — CL (ref 4.0–10.5)
nRBC: 0 % (ref 0.0–0.2)

## 2020-05-28 LAB — CMP (CANCER CENTER ONLY)
ALT: 16 U/L (ref 0–44)
AST: 11 U/L — ABNORMAL LOW (ref 15–41)
Albumin: 3.7 g/dL (ref 3.5–5.0)
Alkaline Phosphatase: 91 U/L (ref 38–126)
Anion gap: 9 (ref 5–15)
BUN: 22 mg/dL (ref 8–23)
CO2: 28 mmol/L (ref 22–32)
Calcium: 9.7 mg/dL (ref 8.9–10.3)
Chloride: 96 mmol/L — ABNORMAL LOW (ref 98–111)
Creatinine: 1.42 mg/dL — ABNORMAL HIGH (ref 0.61–1.24)
GFR, Estimated: 53 mL/min — ABNORMAL LOW (ref >60)
Glucose, Bld: 114 mg/dL — ABNORMAL HIGH (ref 70–99)
Potassium: 4.1 mmol/L (ref 3.5–5.1)
Sodium: 133 mmol/L — ABNORMAL LOW (ref 135–145)
Total Bilirubin: 0.5 mg/dL (ref 0.3–1.2)
Total Protein: 7.5 g/dL (ref 6.5–8.1)

## 2020-05-28 MED ORDER — FILGRASTIM-SNDZ 300 MCG/0.5ML IJ SOSY
300.0000 ug | PREFILLED_SYRINGE | Freq: Once | INTRAMUSCULAR | Status: AC
  Administered 2020-05-28: 300 ug via SUBCUTANEOUS

## 2020-05-28 MED ORDER — FILGRASTIM-SNDZ 300 MCG/0.5ML IJ SOSY
PREFILLED_SYRINGE | INTRAMUSCULAR | Status: AC
  Filled 2020-05-28: qty 0.5

## 2020-05-28 NOTE — Telephone Encounter (Signed)
Scheduled appt per 2/14 sch msg - pt is aware  Of appts for injection on schedule.

## 2020-05-28 NOTE — Progress Notes (Signed)
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 3) Suspicious left renal lesion  PRIOR THERAPY: None  CURRENT THERAPY: Systemic chemotherapy/radiation with cisplatin 80 mg per metered squared on day 1 and etoposide 100 mg per metered squared on days 1, 2, and 3 IV every 3 weeks.  Status post 2 cycles.  First dose on 04/20/20. Onpro was added to the treatment plan starting from cycle #3 due to pancytopenia.   INTERVAL HISTORY: Ian Hart 70 y.o. male returns to the clinic today for a follow-up visit accompanied by his wife.  The patient is feeling fair today without any concerning complaints except for dysphagia/odynophagia secondary to radiation. He completed his last day of radiation last week on 06/01/20. He takes fentanyl and hycet for pain. He also uses Carafate. He lost 9lbs since his last appointment. He is meeting with a member of the nutritionist team tomorrow during infusion.   The patient has been tolerating his treatment fairly well except for chemotherapy-induced pancytopenia for which he required Granix injections x3.  Moving forward, Onpro will be added to the treatment plan. He denies fevers, skin infections, or dysuria. He denies any abnormal bleeding or bruising including hematuria, epistaxis, gingival bleeding, melena, or hemoptysis. He reports his baseline cough which produces clear sputum. Denies shortness of breath and states he was working out in the yard a few days ago. The patient also had a reaction to etoposide with his last infusion which resolved with Pepcid.  Pepcid was added to the premedications.  Otherwise, he denies any significant chest pain or hemoptysis. He states that he "pretty much quit"  smoking. He denies any nausea, vomiting, diarrhea, or constipation.  He denies any headache or visual changes. The patient had a follow-up appointment with urology on Friday due to the suspicious left renal lesion. The patient is here today for evaluation and repeat lab work before starting cycle #3.    MEDICAL HISTORY: Past Medical History:  Diagnosis Date  . History of chicken pox   . History of shingles 04/2010  . Lung cancer ([facility]) 02/2020    ALLERGIES:  is allergic to acyclovir and related.  MEDICATIONS:  Current Outpatient Medications  Medication Sig Dispense Refill  . ascorbic acid (VITAMIN C) 500 MG tablet Take 500 mg by mouth daily.    Marland Kitchen buPROPion (WELLBUTRIN XL) 150 MG 24 hr tablet Take 1 tablet (150 mg total) by mouth daily. 60 tablet 5  . fentaNYL (DURAGESIC) 12 MCG/HR Place 1 patch onto the skin every 3 (three) days. 10 patch 0  . HYDROcodone-acetaminophen (HYCET) 7.5-325 mg/15 ml solution Take 10 mLs by mouth every 6 (six) hours as needed for moderate pain. 473 mL 0  . Multiple Vitamin (MULTIVITAMIN) tablet Take 1 tablet by mouth daily.    . nicotine (NICODERM CQ - DOSED IN MG/24 HOURS) 21 mg/24hr patch Place 1 patch (21 mg total) onto the skin daily. 21 patch 0  . omeprazole (PRILOSEC) 20 MG capsule Take 1 capsule (20 mg total) by mouth daily. 30 capsule 1  . prochlorperazine (COMPAZINE) 10 MG tablet Take 1 tablet (10 mg total) by mouth every 6 (six) hours as needed. 30 tablet 2  . sucralfate (CARAFATE) 1 g tablet Take 1 tablet (1 g total) by  mouth 4 (four) times daily. Dissolve each tablet in 15 cc water before use. 120 tablet 2  . umeclidinium-vilanterol (ANORO ELLIPTA) 62.5-25 MCG/INH AEPB Inhale 1 puff into the lungs daily. 3 each 2  . vitamin B-12 (CYANOCOBALAMIN) 1000 MCG tablet Take 1,000 mcg by mouth daily.    . nicotine (NICODERM CQ - DOSED IN MG/24 HOURS) 14 mg/24hr patch Place 1 patch (14 mg total) onto the skin daily. (Patient not taking: Reported on 06/04/2020) 14  patch 0   No current facility-administered medications for this visit.   Facility-Administered Medications Ordered in Other Visits  Medication Dose Route Frequency Provider Last Rate Last Admin  . 0.9 %  sodium chloride infusion   Intravenous Once Micajah Dennin L, PA-C        SURGICAL HISTORY:  Past Surgical History:  Procedure Laterality Date  . BRONCHIAL BIOPSY  03/22/2020   Procedure: BRONCHIAL BIOPSIES;  Surgeon: Garner Nash, DO;  Location: [facility];  Service: Pulmonary;;  . BRONCHIAL BRUSHINGS  03/22/2020   Procedure: BRONCHIAL BRUSHINGS;  Surgeon: Garner Nash, DO;  Location: [facility];  Service: Pulmonary;;  . BRONCHIAL NEEDLE ASPIRATION BIOPSY  03/22/2020   Procedure: BRONCHIAL NEEDLE ASPIRATION BIOPSIES;  Surgeon: Garner Nash, DO;  Location: [facility];  Service: Pulmonary;;  . BRONCHIAL WASHINGS  03/22/2020   Procedure: BRONCHIAL WASHINGS;  Surgeon: Garner Nash, DO;  Location: [facility];  Service: Pulmonary;;  . FIDUCIAL MARKER PLACEMENT  03/22/2020   Procedure: FIDUCIAL MARKER PLACEMENT;  Surgeon: Garner Nash, DO;  Location: [facility];  Service: Pulmonary;;  . NO PAST SURGERIES    . VIDEO BRONCHOSCOPY WITH ENDOBRONCHIAL NAVIGATION N/A 03/22/2020   Procedure: VIDEO BRONCHOSCOPY WITH ENDOBRONCHIAL NAVIGATION;  Surgeon: Garner Nash, DO;  Location: [facility];  Service: Pulmonary;  Laterality: N/A;  . VIDEO BRONCHOSCOPY WITH ENDOBRONCHIAL ULTRASOUND N/A 03/22/2020   Procedure: VIDEO BRONCHOSCOPY WITH ENDOBRONCHIAL ULTRASOUND;  Surgeon: Garner Nash, DO;  Location: [facility];  Service: Pulmonary;  Laterality: N/A;    REVIEW OF SYSTEMS:   Review of Systems  Constitutional: Positive for fatigue, weight loss, and decreased appetite. Negative for chills and fever. HENT:Positive for odynophagia/dysphagia. Negative for mouth sores or nosebleeds. Eyes: Negative for eye problems and icterus.  Respiratory: Positive for baseline  cough. Negative for hemoptysis, shortness of breath and wheezing.   Cardiovascular: Negative for chest pain and leg swelling.  Gastrointestinal: Negative for abdominal pain, constipation, diarrhea, nausea and vomiting.  Genitourinary: Negative for bladder incontinence, difficulty urinating, dysuria, frequency and hematuria.   Musculoskeletal: Negative for back pain, gait problem, neck pain and neck stiffness.  Skin: Negative for itching and rash.  Neurological: Negative for dizziness, extremity weakness, gait problem, headaches, light-headedness and seizures.  Hematological: Negative for adenopathy. Does not bruise/bleed easily.  Psychiatric/Behavioral: Negative for confusion, depression and sleep disturbance. The patient is not nervous/anxious.     PHYSICAL EXAMINATION:  Blood pressure 132/73, pulse (!) 108, temperature (!) 97 F (36.1 C), temperature source Tympanic, resp. rate 16, height 6' (1.829 m), weight 137 lb 6.4 oz (62.3 kg), SpO2 99 %.  ECOG PERFORMANCE STATUS: 1 - Symptomatic but completely ambulatory  Physical Exam  Constitutional: Oriented to person, place, and time and  thin appearing male and in no distress. HENT:  Head: Normocephalic and atraumatic.  Mouth/Throat: Oropharynx is clear and moist. No oropharyngeal exudate.  Eyes: Conjunctivae are normal. Right eye exhibits no discharge. Left eye exhibits no discharge. No scleral icterus.  Neck: Normal range of motion. Neck  supple.  Cardiovascular: Normal rate, regular rhythm, normal heart sounds and intact distal pulses.   Pulmonary/Chest: Effort normal and breath sounds normal. No respiratory distress. No wheezes. No rales.  Abdominal: Soft. Bowel sounds are normal. Exhibits no distension and no mass. There is no tenderness.  Musculoskeletal: Normal range of motion. Exhibits no edema. Scoliosis noted.  Lymphadenopathy:    No cervical adenopathy.  Neurological: Alert and oriented to person, place, and time. Exhibits normal  muscle tone. Gait normal. Coordination normal.  Skin: Skin is warm and dry. No rash noted. Not diaphoretic. No erythema. No pallor.  Psychiatric: Mood, memory and judgment normal.  Vitals reviewed.  LABORATORY DATA: Lab Results  Component Value Date   WBC 8.8 06/04/2020   HGB 12.3 (L) 06/04/2020   HCT 36.6 (L) 06/04/2020   MCV 91.3 06/04/2020   PLT 280 06/04/2020      Chemistry      Component Value Date/Time   NA 132 (L) 06/04/2020 0734   K 3.6 06/04/2020 0734   CL 95 (L) 06/04/2020 0734   CO2 26 06/04/2020 0734   BUN 21 06/04/2020 0734   CREATININE 1.77 (H) 06/04/2020 0734   CREATININE 0.86 11/12/2011 0917      Component Value Date/Time   CALCIUM 9.2 06/04/2020 0734   ALKPHOS 85 06/04/2020 0734   AST 15 06/04/2020 0734   ALT 14 06/04/2020 0734   BILITOT 0.3 06/04/2020 0734       RADIOGRAPHIC STUDIES:  No results found.   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 3) Suspicious left renal lesion. Evaluation with urology scheduled for 06/08/20  The patient is currently undergoing chemotherapy with cisplatin 80 mg/m2 on days 1 and etoposide 100 mg/m2 on days 1, 2, and 3 IV every 3 weeks. First dose on 04/20/20. He is status post 2 cycles. He is also receiving concurrent radiation. His last day of radiation was on 06/01/20. Onpro was added to the treatment plan starting from cycle #3 due to chemotherapy induced neutropenia. Cisplatin was switched to carboplatin for an AUC of 5 starting from cycle #3 due to renal insufficiency.   The patient was seen with Dr. Julien Nordmann today. Labs were reviewed. His WBCs and platelets have recovered from the pancytopenia last week that required granix x3. His labs have demonstrated worsening renal  insufficiency; therefore, cisplatin was removed from the care plan and switched to carboplatin. Dr. Julien Nordmann recommends that he proceed with cycle #3 today as scheduled today with carboplatin for an AUC of 5 and etoposide 100 mg/m2 with Onpro.   Due to his prior reaction with etoposide, he will need pepcid added to the treatment plan on days 1-3 of every cycle due to his etoposide.  We will add 1L of IV to be given today due to his elevated creatinine. He was also encouraged to increase his oral fluid intake.   We will continue to monitor his labs closely weekly. Advised to call if he develops any concerning bleeding in the interval.   He will continue to follow with urology for the renal lesion. He has an appointment on 06/08/20.   He will meet with a member of the nutritionist team tomorrow regarding his weight loss. Message relayed to radiation regarding his dysphagia. His symptoms are slowly improving. He is using hycet and carafate.  The patient was advised to call immediately if he has any concerning symptoms in the interval. The patient voices understanding of current disease status and treatment options and is in agreement with the current care plan. All questions were answered. The patient knows to call the clinic with any problems, questions or concerns. We can certainly see the patient much sooner if necessary   No orders of the defined types were placed in this encounter.    I spent 30-39 minutes in this encounter.   Chynna Buerkle L Onell Mcmath, PA-C 06/04/20  ADDENDUM: Hematology/Oncology Attending: I had a face-to-face encounter with the patient today.  I reviewed his records and recommended his care plan.  This is a very pleasant 70 years old white male recently diagnosed with poorly differentiated carcinoma, non-small cell lung cancer, adenocarcinoma of the right upper lobe as well as limited stage small cell lung cancer involving station 7 lymph node in December 2021.  The  patient is currently undergoing systemic chemotherapy with cisplatin and/or etoposide concurrent with radiation which was completed recently.  He has been tolerating this treatment well except for fatigue.  The patient was found on recent blood work to have worsening renal function which could be related to his treatment with cisplatin. I had a lengthy discussion with the patient and his wife today about his current condition. I recommended for him to discontinue the treatment with cisplatin and I will switch it to carboplatin for AUC of 5 in addition to the standard treatment with etoposide 100 mg/M2 on days 1, 2 and 3 with Onpro on days 3. The patient is expected to start the first cycle of this treatment today. I discussed with him the adverse effect of the treatment. We will consider the patient for repeat imaging studies after completion of his treatment with cycle #4. He was advised to call immediately if he has any other concerning symptoms in the interval.  Disclaimer: This note was dictated with voice recognition software. Similar sounding words can inadvertently be transcribed and may be missed upon review. Eilleen Kempf, MD 06/04/20

## 2020-05-28 NOTE — Progress Notes (Signed)
  Radiation Oncology         [phone]) [phone] ________________________________  Name: Ian Hart MRN: [id number]  Date: 05/24/2020  DOB: Jun 09, 1950  SIMULATION NOTE   NARRATIVE:  The patient underwent simulation today for ongoing radiation therapy.  The existing CT study set was employed for the purpose of virtual treatment planning.  The target and avoidance structures were reviewed and modified as necessary.  Treatment planning then occurred.  The radiation boost prescription was entered and confirmed.  A total of 5 complex treatment devices were fabricated in the form of multi-leaf collimators to shape radiation around the targets while maximally excluding nearby normal structures. I have requested : Isodose Plan.    PLAN:  This modified radiation beam arrangement is intended to continue the current radiation dose to an additional 6 Gy in 3 fractions for a total cumulative dose of 66 Gy.    ------------------------------------------------  Jodelle Gross, MD, PhD

## 2020-05-28 NOTE — Patient Instructions (Signed)

## 2020-05-28 NOTE — Telephone Encounter (Signed)
CRITICAL VALUE STICKER  CRITICAL VALUE: WBC = 0.2  RECEIVER (on-site recipient of call): Ian Hart  DATE & TIME NOTIFIED: 05/28/20 at 9:02am  MESSENGER (representative from lab): Anette Guarneri  MD NOTIFIED: Cassandra Heilingoetter, PA-C  TIME OF NOTIFICATION: 05/28/20 at 9:10am  RESPONSE: Pt is now being scheduled for Zarxio injections for the next 3 days.  I spoke with pt and advised he is being scheduled for injection appts and we will contact him with the time to return today. Pt also confirmed he is not bleeding.  High priority schedule message has been sent to add pt to the injection schedule.

## 2020-05-29 ENCOUNTER — Inpatient Hospital Stay: Payer: Medicare Other

## 2020-05-29 ENCOUNTER — Ambulatory Visit: Payer: Medicare Other

## 2020-05-29 ENCOUNTER — Ambulatory Visit
Admission: RE | Admit: 2020-05-29 | Discharge: 2020-05-29 | Disposition: A | Discharge status: Home / Self Care | Payer: Medicare Other | Source: Ambulatory Visit | Attending: Radiation Oncology | Admitting: Radiation Oncology

## 2020-05-29 ENCOUNTER — Other Ambulatory Visit: Payer: Self-pay

## 2020-05-29 VITALS — BP 112/70 | HR 106 | Temp 98.6°F | Resp 16

## 2020-05-29 DIAGNOSIS — C3411 Malignant neoplasm of upper lobe, right bronchus or lung: Secondary | ICD-10-CM | POA: Diagnosis not present

## 2020-05-29 DIAGNOSIS — T451X5A Adverse effect of antineoplastic and immunosuppressive drugs, initial encounter: Secondary | ICD-10-CM

## 2020-05-29 DIAGNOSIS — F17210 Nicotine dependence, cigarettes, uncomplicated: Secondary | ICD-10-CM | POA: Diagnosis not present

## 2020-05-29 DIAGNOSIS — D701 Agranulocytosis secondary to cancer chemotherapy: Secondary | ICD-10-CM | POA: Diagnosis not present

## 2020-05-29 DIAGNOSIS — Z5189 Encounter for other specified aftercare: Secondary | ICD-10-CM | POA: Diagnosis not present

## 2020-05-29 DIAGNOSIS — Z5111 Encounter for antineoplastic chemotherapy: Secondary | ICD-10-CM | POA: Diagnosis not present

## 2020-05-29 DIAGNOSIS — C3491 Malignant neoplasm of unspecified part of right bronchus or lung: Secondary | ICD-10-CM | POA: Diagnosis not present

## 2020-05-29 MED ORDER — FILGRASTIM-SNDZ 300 MCG/0.5ML IJ SOSY
PREFILLED_SYRINGE | INTRAMUSCULAR | Status: AC
  Filled 2020-05-29: qty 0.5

## 2020-05-29 MED ORDER — FILGRASTIM-SNDZ 300 MCG/0.5ML IJ SOSY
300.0000 ug | PREFILLED_SYRINGE | Freq: Once | INTRAMUSCULAR | Status: AC
  Administered 2020-05-29: 300 ug via SUBCUTANEOUS

## 2020-05-29 NOTE — Patient Instructions (Signed)

## 2020-05-30 ENCOUNTER — Ambulatory Visit: Payer: Medicare Other

## 2020-05-30 ENCOUNTER — Other Ambulatory Visit: Payer: Self-pay

## 2020-05-30 ENCOUNTER — Ambulatory Visit
Admission: RE | Admit: 2020-05-30 | Discharge: 2020-05-30 | Disposition: A | Discharge status: Home / Self Care | Payer: Medicare Other | Source: Ambulatory Visit | Attending: Radiation Oncology | Admitting: Radiation Oncology

## 2020-05-30 ENCOUNTER — Inpatient Hospital Stay: Payer: Medicare Other

## 2020-05-30 VITALS — BP 104/61 | HR 103 | Temp 98.6°F | Resp 18

## 2020-05-30 DIAGNOSIS — Z5189 Encounter for other specified aftercare: Secondary | ICD-10-CM | POA: Diagnosis not present

## 2020-05-30 DIAGNOSIS — C3491 Malignant neoplasm of unspecified part of right bronchus or lung: Secondary | ICD-10-CM | POA: Diagnosis not present

## 2020-05-30 DIAGNOSIS — Z5111 Encounter for antineoplastic chemotherapy: Secondary | ICD-10-CM | POA: Diagnosis not present

## 2020-05-30 DIAGNOSIS — D701 Agranulocytosis secondary to cancer chemotherapy: Secondary | ICD-10-CM

## 2020-05-30 DIAGNOSIS — C3411 Malignant neoplasm of upper lobe, right bronchus or lung: Secondary | ICD-10-CM | POA: Diagnosis not present

## 2020-05-30 DIAGNOSIS — F17210 Nicotine dependence, cigarettes, uncomplicated: Secondary | ICD-10-CM | POA: Diagnosis not present

## 2020-05-30 DIAGNOSIS — T451X5A Adverse effect of antineoplastic and immunosuppressive drugs, initial encounter: Secondary | ICD-10-CM

## 2020-05-30 MED ORDER — FILGRASTIM-SNDZ 300 MCG/0.5ML IJ SOSY
PREFILLED_SYRINGE | INTRAMUSCULAR | Status: AC
  Filled 2020-05-30: qty 0.5

## 2020-05-30 MED ORDER — FILGRASTIM-SNDZ 300 MCG/0.5ML IJ SOSY
300.0000 ug | PREFILLED_SYRINGE | Freq: Once | INTRAMUSCULAR | Status: AC
  Administered 2020-05-30: 300 ug via SUBCUTANEOUS

## 2020-05-30 NOTE — Patient Instructions (Signed)

## 2020-05-31 ENCOUNTER — Ambulatory Visit
Admission: RE | Admit: 2020-05-31 | Discharge: 2020-05-31 | Disposition: A | Discharge status: Home / Self Care | Payer: Medicare Other | Source: Ambulatory Visit | Attending: Radiation Oncology | Admitting: Radiation Oncology

## 2020-05-31 ENCOUNTER — Ambulatory Visit: Payer: Medicare Other

## 2020-05-31 DIAGNOSIS — C3411 Malignant neoplasm of upper lobe, right bronchus or lung: Secondary | ICD-10-CM | POA: Diagnosis not present

## 2020-05-31 DIAGNOSIS — C3491 Malignant neoplasm of unspecified part of right bronchus or lung: Secondary | ICD-10-CM | POA: Diagnosis not present

## 2020-05-31 DIAGNOSIS — F17210 Nicotine dependence, cigarettes, uncomplicated: Secondary | ICD-10-CM | POA: Diagnosis not present

## 2020-06-01 ENCOUNTER — Ambulatory Visit
Admission: RE | Admit: 2020-06-01 | Discharge: 2020-06-01 | Disposition: A | Discharge status: Home / Self Care | Payer: Medicare Other | Source: Ambulatory Visit | Attending: Radiation Oncology | Admitting: Radiation Oncology

## 2020-06-01 ENCOUNTER — Encounter: Payer: Self-pay | Admitting: Radiation Oncology

## 2020-06-01 DIAGNOSIS — C3491 Malignant neoplasm of unspecified part of right bronchus or lung: Secondary | ICD-10-CM | POA: Diagnosis not present

## 2020-06-01 DIAGNOSIS — C3411 Malignant neoplasm of upper lobe, right bronchus or lung: Secondary | ICD-10-CM | POA: Diagnosis not present

## 2020-06-01 DIAGNOSIS — F17210 Nicotine dependence, cigarettes, uncomplicated: Secondary | ICD-10-CM | POA: Diagnosis not present

## 2020-06-01 MED FILL — Fosaprepitant Dimeglumine For IV Infusion 150 MG (Base Eq): INTRAVENOUS | Qty: 5 | Status: AC

## 2020-06-01 MED FILL — Dexamethasone Sodium Phosphate Inj 100 MG/10ML: INTRAMUSCULAR | Qty: 1 | Status: AC

## 2020-06-04 ENCOUNTER — Other Ambulatory Visit: Payer: Self-pay | Admitting: Radiation Oncology

## 2020-06-04 ENCOUNTER — Telehealth: Payer: Self-pay | Admitting: Physician Assistant

## 2020-06-04 ENCOUNTER — Other Ambulatory Visit: Payer: Self-pay

## 2020-06-04 ENCOUNTER — Telehealth: Payer: Self-pay | Admitting: *Deleted

## 2020-06-04 ENCOUNTER — Inpatient Hospital Stay: Payer: Medicare Other

## 2020-06-04 ENCOUNTER — Other Ambulatory Visit: Payer: Self-pay | Admitting: Internal Medicine

## 2020-06-04 ENCOUNTER — Inpatient Hospital Stay: Payer: Medicare Other | Admitting: Physician Assistant

## 2020-06-04 ENCOUNTER — Telehealth: Payer: Self-pay

## 2020-06-04 ENCOUNTER — Encounter: Payer: Self-pay | Admitting: Physician Assistant

## 2020-06-04 VITALS — HR 95

## 2020-06-04 VITALS — BP 132/73 | HR 108 | Temp 97.0°F | Resp 16 | Ht 72.0 in | Wt 137.4 lb

## 2020-06-04 DIAGNOSIS — C3491 Malignant neoplasm of unspecified part of right bronchus or lung: Secondary | ICD-10-CM

## 2020-06-04 DIAGNOSIS — R7989 Other specified abnormal findings of blood chemistry: Secondary | ICD-10-CM

## 2020-06-04 DIAGNOSIS — Z5111 Encounter for antineoplastic chemotherapy: Secondary | ICD-10-CM

## 2020-06-04 DIAGNOSIS — C3490 Malignant neoplasm of unspecified part of unspecified bronchus or lung: Secondary | ICD-10-CM

## 2020-06-04 DIAGNOSIS — Z5189 Encounter for other specified aftercare: Secondary | ICD-10-CM | POA: Diagnosis not present

## 2020-06-04 DIAGNOSIS — D701 Agranulocytosis secondary to cancer chemotherapy: Secondary | ICD-10-CM | POA: Diagnosis not present

## 2020-06-04 DIAGNOSIS — C3411 Malignant neoplasm of upper lobe, right bronchus or lung: Secondary | ICD-10-CM | POA: Diagnosis not present

## 2020-06-04 LAB — CBC WITH DIFFERENTIAL (CANCER CENTER ONLY)
Abs Immature Granulocytes: 2.19 10*3/uL — ABNORMAL HIGH (ref 0.00–0.07)
Basophils Absolute: 0 10*3/uL (ref 0.0–0.1)
Basophils Relative: 0 %
Eosinophils Absolute: 0 10*3/uL (ref 0.0–0.5)
Eosinophils Relative: 0 %
HCT: 36.6 % — ABNORMAL LOW (ref 39.0–52.0)
Hemoglobin: 12.3 g/dL — ABNORMAL LOW (ref 13.0–17.0)
Immature Granulocytes: 25 %
Lymphocytes Relative: 7 %
Lymphs Abs: 0.6 10*3/uL — ABNORMAL LOW (ref 0.7–4.0)
MCH: 30.7 pg (ref 26.0–34.0)
MCHC: 33.6 g/dL (ref 30.0–36.0)
MCV: 91.3 fL (ref 80.0–100.0)
Monocytes Absolute: 1.8 10*3/uL — ABNORMAL HIGH (ref 0.1–1.0)
Monocytes Relative: 20 %
Neutro Abs: 4.2 10*3/uL (ref 1.7–7.7)
Neutrophils Relative %: 48 %
Platelet Count: 280 10*3/uL (ref 150–400)
RBC: 4.01 MIL/uL — ABNORMAL LOW (ref 4.22–5.81)
RDW: 12.1 % (ref 11.5–15.5)
WBC Count: 8.8 10*3/uL (ref 4.0–10.5)
nRBC: 0 % (ref 0.0–0.2)

## 2020-06-04 LAB — CMP (CANCER CENTER ONLY)
ALT: 14 U/L (ref 0–44)
AST: 15 U/L (ref 15–41)
Albumin: 3.5 g/dL (ref 3.5–5.0)
Alkaline Phosphatase: 85 U/L (ref 38–126)
Anion gap: 11 (ref 5–15)
BUN: 21 mg/dL (ref 8–23)
CO2: 26 mmol/L (ref 22–32)
Calcium: 9.2 mg/dL (ref 8.9–10.3)
Chloride: 95 mmol/L — ABNORMAL LOW (ref 98–111)
Creatinine: 1.77 mg/dL — ABNORMAL HIGH (ref 0.61–1.24)
GFR, Estimated: 41 mL/min — ABNORMAL LOW (ref >60)
Glucose, Bld: 97 mg/dL (ref 70–99)
Potassium: 3.6 mmol/L (ref 3.5–5.1)
Sodium: 132 mmol/L — ABNORMAL LOW (ref 135–145)
Total Bilirubin: 0.3 mg/dL (ref 0.3–1.2)
Total Protein: 7 g/dL (ref 6.5–8.1)

## 2020-06-04 MED ORDER — SODIUM CHLORIDE 0.9 % IV SOLN
Freq: Once | INTRAVENOUS | Status: AC
  Filled 2020-06-04: qty 250

## 2020-06-04 MED ORDER — CARBOPLATIN CHEMO INJECTION 450 MG/45ML
298.5000 mg | Freq: Once | INTRAVENOUS | Status: AC
Start: 2020-06-04 — End: 2020-06-04
  Administered 2020-06-04: 300 mg via INTRAVENOUS
  Filled 2020-06-04: qty 30

## 2020-06-04 MED ORDER — FAMOTIDINE IN NACL 20-0.9 MG/50ML-% IV SOLN
20.0000 mg | Freq: Once | INTRAVENOUS | Status: AC
  Administered 2020-06-04: 20 mg via INTRAVENOUS

## 2020-06-04 MED ORDER — SODIUM CHLORIDE 0.9 % IV SOLN
100.0000 mg/m2 | Freq: Once | INTRAVENOUS | Status: AC
  Administered 2020-06-04: 180 mg via INTRAVENOUS
  Filled 2020-06-04: qty 9

## 2020-06-04 MED ORDER — FAMOTIDINE IN NACL 20-0.9 MG/50ML-% IV SOLN
INTRAVENOUS | Status: AC
  Filled 2020-06-04: qty 50

## 2020-06-04 MED ORDER — SODIUM CHLORIDE 0.9 % IV SOLN
150.0000 mg | Freq: Once | INTRAVENOUS | Status: DC
  Filled 2020-06-04: qty 5

## 2020-06-04 MED ORDER — PALONOSETRON HCL INJECTION 0.25 MG/5ML
0.2500 mg | Freq: Once | INTRAVENOUS | Status: AC
  Administered 2020-06-04: 0.25 mg via INTRAVENOUS

## 2020-06-04 MED ORDER — HYDROCODONE-ACETAMINOPHEN 7.5-325 MG/15ML PO SOLN: 10.0000 mL | Freq: Four times a day (QID) | ORAL | 0 refills | Status: DC | PRN

## 2020-06-04 MED ORDER — SODIUM CHLORIDE 0.9 % IV SOLN
10.0000 mg | Freq: Once | INTRAVENOUS | Status: DC
  Administered 2020-06-04: 10 mg via INTRAVENOUS
  Filled 2020-06-04: qty 10

## 2020-06-04 MED ORDER — SODIUM CHLORIDE 0.9 % IV SOLN
150.0000 mg | Freq: Once | INTRAVENOUS | Status: DC
  Administered 2020-06-04: 150 mg via INTRAVENOUS
  Filled 2020-06-04: qty 150

## 2020-06-04 MED ORDER — PALONOSETRON HCL INJECTION 0.25 MG/5ML
INTRAVENOUS | Status: AC
  Filled 2020-06-04: qty 5

## 2020-06-04 MED ORDER — SODIUM CHLORIDE 0.9 % IV SOLN
10.0000 mg | Freq: Once | INTRAVENOUS | Status: DC
  Filled 2020-06-04: qty 1

## 2020-06-04 NOTE — Telephone Encounter (Signed)
Spoke with the patient's wife to let her know we have sent in a refill on his pain medication to the requested pharmacy.  She confirmed that he is also using the other medicines we recommended for his swallowing.  She will give [name] a call back with any other questions or concerns.    Gloriajean Dell. Leonie Green, BSN

## 2020-06-04 NOTE — Progress Notes (Signed)
  Patient Name: Ian Hart MRN: [id number] DOB: [birth year] Referring Physician: Debbrah Alar (Profile Not Attached) Date of Service: 06/01/2020 [facility] [place], [place]                                                        End Of Treatment Note  Diagnoses: C34.11-Malignant neoplasm of upper lobe, right bronchus or lung  Cancer Staging: Stage IIIA, [phone], mixed histology NSCLC and small cell carcinoma of the RUL and subcarinal nodal station  Intent: Curative  Radiation Treatment Dates: 04/17/2020 through 06/01/2020 Site Technique Total Dose (Gy) Dose per Fx (Gy) Completed Fx Beam Energies  Lung, Right: Lung_Rt 3D 60/60 2 30/30 6X, 10X  Lung, Right: Lung_Rt_Bst 3D 6/6 2 3/3 6X, 10X   Narrative: The patient tolerated radiation therapy relatively well. He did struggle with esophagitis during treatment.  Plan: The patient will receive a call in about one month from the radiation oncology department. He will continue follow up with Dr. Julien Nordmann as well.   ________________________________________________    Carola Rhine, [facility]

## 2020-06-04 NOTE — Telephone Encounter (Signed)
Pt is requesting a refill of Hydrocodone be sen to [facility] in [place].  Pt is also complaining of pain when he drinks and eats which he says is a deterrent from doing so. He requests a phone call to advise what can be done.

## 2020-06-04 NOTE — Patient Instructions (Signed)
[facility] Discharge Instructions for Patients Receiving Chemotherapy  Today you received the following chemotherapy agents: Carboplatin, Etoposide  To help prevent nausea and vomiting after your treatment, we encourage you to take your nausea medication as directed.   If you develop nausea and vomiting that is not controlled by your nausea medication, call the clinic.   BELOW ARE SYMPTOMS THAT SHOULD BE REPORTED IMMEDIATELY:  *FEVER GREATER THAN 100.5 F  *CHILLS WITH OR WITHOUT FEVER  NAUSEA AND VOMITING THAT IS NOT CONTROLLED WITH YOUR NAUSEA MEDICATION  *UNUSUAL SHORTNESS OF BREATH  *UNUSUAL BRUISING OR BLEEDING  TENDERNESS IN MOUTH AND THROAT WITH OR WITHOUT PRESENCE OF ULCERS  *URINARY PROBLEMS  *BOWEL PROBLEMS  UNUSUAL RASH Items with * indicate a potential emergency and should be followed up as soon as possible.  Feel free to call the clinic should you have any questions or concerns. The clinic phone number is [phone].  Please show the [facility] at check-in to the Emergency Department and triage nurse.  Carboplatin injection What is this medicine? CARBOPLATIN (KAR boe pla tin) is a chemotherapy drug. It targets fast dividing cells, like cancer cells, and causes these cells to die. This medicine is used to treat ovarian cancer and many other cancers. This medicine may be used for other purposes; ask your health care provider or pharmacist if you have questions. COMMON BRAND NAME(S): Paraplatin What should I tell my health care provider before I take this medicine? They need to know if you have any of these conditions:  blood disorders  hearing problems  kidney disease  recent or ongoing radiation therapy  an unusual or allergic reaction to carboplatin, cisplatin, other chemotherapy, other medicines, foods, dyes, or preservatives  pregnant or trying to get pregnant  breast-feeding How should I use this medicine? This drug is  usually given as an infusion into a vein. It is administered in a hospital or clinic by a specially trained health care professional. Talk to your pediatrician regarding the use of this medicine in children. Special care may be needed. Overdosage: If you think you have taken too much of this medicine contact a poison control center or emergency room at once. NOTE: This medicine is only for you. Do not share this medicine with others. What if I miss a dose? It is important not to miss a dose. Call your doctor or health care professional if you are unable to keep an appointment. What may interact with this medicine?  medicines for seizures  medicines to increase blood counts like filgrastim, pegfilgrastim, sargramostim  some antibiotics like amikacin, gentamicin, neomycin, streptomycin, tobramycin  vaccines Talk to your doctor or health care professional before taking any of these medicines:  acetaminophen  aspirin  ibuprofen  ketoprofen  naproxen This list may not describe all possible interactions. Give your health care provider a list of all the medicines, herbs, non-prescription drugs, or dietary supplements you use. Also tell them if you smoke, drink alcohol, or use illegal drugs. Some items may interact with your medicine. What should I watch for while using this medicine? Your condition will be monitored carefully while you are receiving this medicine. You will need important blood work done while you are taking this medicine. This drug may make you feel generally unwell. This is not uncommon, as chemotherapy can affect healthy cells as well as cancer cells. Report any side effects. Continue your course of treatment even though you feel ill unless your doctor tells you to  stop. In some cases, you may be given additional medicines to help with side effects. Follow all directions for their use. Call your doctor or health care professional for advice if you get a fever, chills or  sore throat, or other symptoms of a cold or flu. Do not treat yourself. This drug decreases your body's ability to fight infections. Try to avoid being around people who are sick. This medicine may increase your risk to bruise or bleed. Call your doctor or health care professional if you notice any unusual bleeding. Be careful brushing and flossing your teeth or using a toothpick because you may get an infection or bleed more easily. If you have any dental work done, tell your dentist you are receiving this medicine. Avoid taking products that contain aspirin, acetaminophen, ibuprofen, naproxen, or ketoprofen unless instructed by your doctor. These medicines may hide a fever. Do not become pregnant while taking this medicine. Women should inform their doctor if they wish to become pregnant or think they might be pregnant. There is a potential for serious side effects to an unborn child. Talk to your health care professional or pharmacist for more information. Do not breast-feed an infant while taking this medicine. What side effects may I notice from receiving this medicine? Side effects that you should report to your doctor or health care professional as soon as possible:  allergic reactions like skin rash, itching or hives, swelling of the face, lips, or tongue  signs of infection - fever or chills, cough, sore throat, pain or difficulty passing urine  signs of decreased platelets or bleeding - bruising, pinpoint red spots on the skin, black, tarry stools, nosebleeds  signs of decreased red blood cells - unusually weak or tired, fainting spells, lightheadedness  breathing problems  changes in hearing  changes in vision  chest pain  high blood pressure  low blood counts - This drug may decrease the number of white blood cells, red blood cells and platelets. You may be at increased risk for infections and bleeding.  nausea and vomiting  pain, swelling, redness or irritation at the  injection site  pain, tingling, numbness in the hands or feet  problems with balance, talking, walking  trouble passing urine or change in the amount of urine Side effects that usually do not require medical attention (report to your doctor or health care professional if they continue or are bothersome):  hair loss  loss of appetite  metallic taste in the mouth or changes in taste This list may not describe all possible side effects. Call your doctor for medical advice about side effects. You may report side effects to FDA at 1-800-FDA-1088. Where should I keep my medicine? This drug is given in a hospital or clinic and will not be stored at home. NOTE: This sheet is a summary. It may not cover all possible information. If you have questions about this medicine, talk to your doctor, pharmacist, or health care provider.  2021 [employer]/Gold Standard (2007-07-06 14:38:05)

## 2020-06-04 NOTE — Progress Notes (Signed)
DISCONTINUE ON PATHWAY REGIMEN - Small Cell Lung     A cycle is every 21 days:     Etoposide      Cisplatin   **Always confirm dose/schedule in your pharmacy ordering system**  REASON: Toxicities / Adverse Event PRIOR TREATMENT: LOS15: Cisplatin 75 mg/m2 D1 + Etoposide 100 mg/m2 IV D1-3 q21 Days x 4 Cycles with Concurrent Radiation TREATMENT RESPONSE: Unable to Evaluate  START ON PATHWAY REGIMEN - Small Cell Lung     A cycle is every 21 days:     Carboplatin      Etoposide   **Always confirm dose/schedule in your pharmacy ordering system**  Patient Characteristics: Newly Diagnosed, Preoperative or Nonsurgical Candidate (Clinical Staging), First Line, Limited Stage, Nonsurgical Candidate Therapeutic Status: Newly Diagnosed, Preoperative or Nonsurgical Candidate (Clinical Staging) AJCC T Category: cTX AJCC N Category: cN2 AJCC M Category: cM0 AJCC 8 Stage Grouping: Unknown Stage Classification: Limited Surgical Candidacy: Nonsurgical Candidate Intent of Therapy: Curative Intent, Discussed with Patient

## 2020-06-04 NOTE — Telephone Encounter (Signed)
I'll send in a refill. Can you encourage him to also continue carafate, OTC mylanta/maalox, and try to continue with liquids to keep hydrated?

## 2020-06-04 NOTE — Telephone Encounter (Signed)
Scheduled appointments per 2/21 los. Will have updated calendar printed for patient

## 2020-06-05 ENCOUNTER — Inpatient Hospital Stay: Payer: Medicare Other

## 2020-06-05 ENCOUNTER — Inpatient Hospital Stay: Payer: Medicare Other | Admitting: Nutrition

## 2020-06-05 ENCOUNTER — Other Ambulatory Visit: Payer: Self-pay

## 2020-06-05 VITALS — BP 121/63 | HR 87 | Temp 98.2°F | Resp 16

## 2020-06-05 DIAGNOSIS — Z5111 Encounter for antineoplastic chemotherapy: Secondary | ICD-10-CM | POA: Diagnosis not present

## 2020-06-05 DIAGNOSIS — C3411 Malignant neoplasm of upper lobe, right bronchus or lung: Secondary | ICD-10-CM | POA: Diagnosis not present

## 2020-06-05 DIAGNOSIS — C3491 Malignant neoplasm of unspecified part of right bronchus or lung: Secondary | ICD-10-CM

## 2020-06-05 DIAGNOSIS — Z5189 Encounter for other specified aftercare: Secondary | ICD-10-CM | POA: Diagnosis not present

## 2020-06-05 DIAGNOSIS — D701 Agranulocytosis secondary to cancer chemotherapy: Secondary | ICD-10-CM | POA: Diagnosis not present

## 2020-06-05 MED ORDER — SODIUM CHLORIDE 0.9 % IV SOLN
10.0000 mg | Freq: Once | INTRAVENOUS | Status: AC
  Administered 2020-06-05: 10 mg via INTRAVENOUS
  Filled 2020-06-05: qty 10

## 2020-06-05 MED ORDER — FAMOTIDINE IN NACL 20-0.9 MG/50ML-% IV SOLN
20.0000 mg | Freq: Once | INTRAVENOUS | Status: AC
  Administered 2020-06-05: 20 mg via INTRAVENOUS

## 2020-06-05 MED ORDER — FAMOTIDINE IN NACL 20-0.9 MG/50ML-% IV SOLN
INTRAVENOUS | Status: AC
  Filled 2020-06-05: qty 50

## 2020-06-05 MED ORDER — SODIUM CHLORIDE 0.9 % IV SOLN
Freq: Once | INTRAVENOUS | Status: AC
  Filled 2020-06-05: qty 250

## 2020-06-05 MED ORDER — SODIUM CHLORIDE 0.9 % IV SOLN
100.0000 mg/m2 | Freq: Once | INTRAVENOUS | Status: AC
  Administered 2020-06-05: 180 mg via INTRAVENOUS
  Filled 2020-06-05: qty 9

## 2020-06-05 NOTE — Progress Notes (Signed)
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 Weight has decreased dramatically to 137.4 pounds from 161 pounds January 8.  This is a 15% decrease in less than 2 months which is significant. Patient reports throat pain intensified during the last 3 radiation therapy treatments.  His last treatment was on February 18.  He states he noticed a slight improvement yesterday. He has been drinking 1 Carnation breakfast essentials daily. He has not tried pured food but agrees to try today.  Nutrition diagnosis: Food and nutrition related knowledge deficit continues.  Intervention: Recommend increase Carnation breakfast essentials 3 times daily to 4 times daily. Stressed importance of blenderized foods to consistency patient can swallow easily. Educated on importance of weight maintenance/weight gain. Medications as prescribed by MD.  Monitoring, evaluation, goals: Patient will work to increase calories and protein to minimize further weight loss.  Next visit: Tuesday, March 15 during infusion.  **Disclaimer: This note was dictated with voice recognition software. Similar sounding words can inadvertently be transcribed and this note may contain transcription errors which may not have been corrected upon publication of note.**

## 2020-06-05 NOTE — Progress Notes (Signed)
Spoke with Dr. Julien Nordmann at 847 am today. Okay to treat whole cycle with Cr of 1.77.

## 2020-06-05 NOTE — Patient Instructions (Signed)
[facility] Discharge Instructions for Patients Receiving Chemotherapy  Today you received the following chemotherapy agents: Etoposide  To help prevent nausea and vomiting after your treatment, we encourage you to take your nausea medication as directed.    If you develop nausea and vomiting that is not controlled by your nausea medication, call the clinic.   BELOW ARE SYMPTOMS THAT SHOULD BE REPORTED IMMEDIATELY:  *FEVER GREATER THAN 100.5 F  *CHILLS WITH OR WITHOUT FEVER  NAUSEA AND VOMITING THAT IS NOT CONTROLLED WITH YOUR NAUSEA MEDICATION  *UNUSUAL SHORTNESS OF BREATH  *UNUSUAL BRUISING OR BLEEDING  TENDERNESS IN MOUTH AND THROAT WITH OR WITHOUT PRESENCE OF ULCERS  *URINARY PROBLEMS  *BOWEL PROBLEMS  UNUSUAL RASH Items with * indicate a potential emergency and should be followed up as soon as possible.  Feel free to call the clinic should you have any questions or concerns. The clinic phone number is [phone].  Please show the [facility] at check-in to the Emergency Department and triage nurse.

## 2020-06-06 ENCOUNTER — Other Ambulatory Visit: Payer: Self-pay | Admitting: Internal Medicine

## 2020-06-06 ENCOUNTER — Inpatient Hospital Stay: Payer: Medicare Other

## 2020-06-06 ENCOUNTER — Other Ambulatory Visit: Payer: Self-pay

## 2020-06-06 VITALS — BP 120/62 | HR 78 | Temp 98.1°F | Resp 18

## 2020-06-06 DIAGNOSIS — C3491 Malignant neoplasm of unspecified part of right bronchus or lung: Secondary | ICD-10-CM

## 2020-06-06 DIAGNOSIS — C3411 Malignant neoplasm of upper lobe, right bronchus or lung: Secondary | ICD-10-CM | POA: Diagnosis not present

## 2020-06-06 DIAGNOSIS — Z5111 Encounter for antineoplastic chemotherapy: Secondary | ICD-10-CM | POA: Diagnosis not present

## 2020-06-06 DIAGNOSIS — Z5189 Encounter for other specified aftercare: Secondary | ICD-10-CM | POA: Diagnosis not present

## 2020-06-06 DIAGNOSIS — D701 Agranulocytosis secondary to cancer chemotherapy: Secondary | ICD-10-CM | POA: Diagnosis not present

## 2020-06-06 MED ORDER — FAMOTIDINE IN NACL 20-0.9 MG/50ML-% IV SOLN
20.0000 mg | Freq: Once | INTRAVENOUS | Status: AC
  Administered 2020-06-06: 20 mg via INTRAVENOUS

## 2020-06-06 MED ORDER — SODIUM CHLORIDE 0.9 % IV SOLN
10.0000 mg | Freq: Once | INTRAVENOUS | Status: AC
  Administered 2020-06-06: 10 mg via INTRAVENOUS
  Filled 2020-06-06: qty 10

## 2020-06-06 MED ORDER — FAMOTIDINE IN NACL 20-0.9 MG/50ML-% IV SOLN
INTRAVENOUS | Status: AC
  Filled 2020-06-06: qty 50

## 2020-06-06 MED ORDER — PEGFILGRASTIM 6 MG/0.6ML ~~LOC~~ PSKT
6.0000 mg | PREFILLED_SYRINGE | Freq: Once | SUBCUTANEOUS | Status: AC
  Administered 2020-06-06: 6 mg via SUBCUTANEOUS

## 2020-06-06 MED ORDER — PEGFILGRASTIM 6 MG/0.6ML ~~LOC~~ PSKT
PREFILLED_SYRINGE | SUBCUTANEOUS | Status: AC
  Filled 2020-06-06: qty 0.6

## 2020-06-06 MED ORDER — SODIUM CHLORIDE 0.9 % IV SOLN
100.0000 mg/m2 | Freq: Once | INTRAVENOUS | Status: AC
  Administered 2020-06-06: 180 mg via INTRAVENOUS
  Filled 2020-06-06: qty 9

## 2020-06-06 MED ORDER — SODIUM CHLORIDE 0.9 % IV SOLN
Freq: Once | INTRAVENOUS | Status: AC
  Filled 2020-06-06: qty 250

## 2020-06-06 NOTE — Patient Instructions (Signed)
[facility] Discharge Instructions for Patients Receiving Chemotherapy  Today you received the following chemotherapy agents: Etoposide.  To help prevent nausea and vomiting after your treatment, we encourage you to take your nausea medication as directed.   If you develop nausea and vomiting that is not controlled by your nausea medication, call the clinic.   BELOW ARE SYMPTOMS THAT SHOULD BE REPORTED IMMEDIATELY:  *FEVER GREATER THAN 100.5 F  *CHILLS WITH OR WITHOUT FEVER  NAUSEA AND VOMITING THAT IS NOT CONTROLLED WITH YOUR NAUSEA MEDICATION  *UNUSUAL SHORTNESS OF BREATH  *UNUSUAL BRUISING OR BLEEDING  TENDERNESS IN MOUTH AND THROAT WITH OR WITHOUT PRESENCE OF ULCERS  *URINARY PROBLEMS  *BOWEL PROBLEMS  UNUSUAL RASH Items with * indicate a potential emergency and should be followed up as soon as possible.  Feel free to call the clinic should you have any questions or concerns. The clinic phone number is [phone].  Please show the [facility] at check-in to the Emergency Department and triage nurse.

## 2020-06-08 DIAGNOSIS — D49512 Neoplasm of unspecified behavior of left kidney: Secondary | ICD-10-CM | POA: Diagnosis not present

## 2020-06-11 ENCOUNTER — Inpatient Hospital Stay: Payer: Medicare Other

## 2020-06-11 ENCOUNTER — Other Ambulatory Visit: Payer: Self-pay

## 2020-06-11 DIAGNOSIS — C3411 Malignant neoplasm of upper lobe, right bronchus or lung: Secondary | ICD-10-CM | POA: Diagnosis not present

## 2020-06-11 DIAGNOSIS — D701 Agranulocytosis secondary to cancer chemotherapy: Secondary | ICD-10-CM | POA: Diagnosis not present

## 2020-06-11 DIAGNOSIS — C3490 Malignant neoplasm of unspecified part of unspecified bronchus or lung: Secondary | ICD-10-CM

## 2020-06-11 DIAGNOSIS — Z5189 Encounter for other specified aftercare: Secondary | ICD-10-CM | POA: Diagnosis not present

## 2020-06-11 DIAGNOSIS — Z5111 Encounter for antineoplastic chemotherapy: Secondary | ICD-10-CM | POA: Diagnosis not present

## 2020-06-11 LAB — CMP (CANCER CENTER ONLY)
ALT: 13 U/L (ref 0–44)
AST: 14 U/L — ABNORMAL LOW (ref 15–41)
Albumin: 3.4 g/dL — ABNORMAL LOW (ref 3.5–5.0)
Alkaline Phosphatase: 104 U/L (ref 38–126)
Anion gap: 8 (ref 5–15)
BUN: 17 mg/dL (ref 8–23)
CO2: 27 mmol/L (ref 22–32)
Calcium: 9.3 mg/dL (ref 8.9–10.3)
Chloride: 101 mmol/L (ref 98–111)
Creatinine: 0.97 mg/dL (ref 0.61–1.24)
GFR, Estimated: >60 mL/min (ref >60)
Glucose, Bld: 96 mg/dL (ref 70–99)
Potassium: 4.6 mmol/L (ref 3.5–5.1)
Sodium: 136 mmol/L (ref 135–145)
Total Bilirubin: 0.9 mg/dL (ref 0.3–1.2)
Total Protein: 6.4 g/dL — ABNORMAL LOW (ref 6.5–8.1)

## 2020-06-11 LAB — CBC WITH DIFFERENTIAL (CANCER CENTER ONLY)
Abs Immature Granulocytes: 0.29 10*3/uL — ABNORMAL HIGH (ref 0.00–0.07)
Basophils Absolute: 0.1 10*3/uL (ref 0.0–0.1)
Basophils Relative: 2 %
Eosinophils Absolute: 0 10*3/uL (ref 0.0–0.5)
Eosinophils Relative: 0 %
HCT: 31.9 % — ABNORMAL LOW (ref 39.0–52.0)
Hemoglobin: 10.5 g/dL — ABNORMAL LOW (ref 13.0–17.0)
Immature Granulocytes: 9 %
Lymphocytes Relative: 6 %
Lymphs Abs: 0.2 10*3/uL — ABNORMAL LOW (ref 0.7–4.0)
MCH: 30.3 pg (ref 26.0–34.0)
MCHC: 32.9 g/dL (ref 30.0–36.0)
MCV: 91.9 fL (ref 80.0–100.0)
Monocytes Absolute: 0.1 10*3/uL (ref 0.1–1.0)
Monocytes Relative: 3 %
Neutro Abs: 2.7 10*3/uL (ref 1.7–7.7)
Neutrophils Relative %: 80 %
Platelet Count: 181 10*3/uL (ref 150–400)
RBC: 3.47 MIL/uL — ABNORMAL LOW (ref 4.22–5.81)
RDW: 13 % (ref 11.5–15.5)
WBC Count: 3.4 10*3/uL — ABNORMAL LOW (ref 4.0–10.5)
nRBC: 0 % (ref 0.0–0.2)

## 2020-06-14 ENCOUNTER — Other Ambulatory Visit (HOSPITAL_COMMUNITY): Payer: Self-pay | Admitting: Urology

## 2020-06-14 DIAGNOSIS — D49512 Neoplasm of unspecified behavior of left kidney: Secondary | ICD-10-CM

## 2020-06-18 ENCOUNTER — Other Ambulatory Visit: Payer: Self-pay

## 2020-06-18 ENCOUNTER — Other Ambulatory Visit: Payer: Self-pay | Admitting: Medical Oncology

## 2020-06-18 ENCOUNTER — Telehealth: Payer: Self-pay | Admitting: Medical Oncology

## 2020-06-18 ENCOUNTER — Inpatient Hospital Stay: Payer: Medicare Other

## 2020-06-18 ENCOUNTER — Inpatient Hospital Stay: Payer: Medicare Other | Attending: Physician Assistant

## 2020-06-18 ENCOUNTER — Ambulatory Visit: Payer: Medicare Other | Admitting: Pulmonary Disease

## 2020-06-18 ENCOUNTER — Other Ambulatory Visit: Payer: Self-pay | Admitting: Physician Assistant

## 2020-06-18 ENCOUNTER — Encounter: Payer: Self-pay | Admitting: Pulmonary Disease

## 2020-06-18 VITALS — BP 124/64 | HR 96 | Temp 98.3°F | Ht 73.0 in | Wt 134.4 lb

## 2020-06-18 DIAGNOSIS — C3491 Malignant neoplasm of unspecified part of right bronchus or lung: Secondary | ICD-10-CM

## 2020-06-18 DIAGNOSIS — C3490 Malignant neoplasm of unspecified part of unspecified bronchus or lung: Secondary | ICD-10-CM

## 2020-06-18 DIAGNOSIS — R64 Cachexia: Secondary | ICD-10-CM | POA: Diagnosis not present

## 2020-06-18 DIAGNOSIS — D696 Thrombocytopenia, unspecified: Secondary | ICD-10-CM

## 2020-06-18 DIAGNOSIS — Z87891 Personal history of nicotine dependence: Secondary | ICD-10-CM

## 2020-06-18 DIAGNOSIS — J432 Centrilobular emphysema: Secondary | ICD-10-CM | POA: Diagnosis not present

## 2020-06-18 DIAGNOSIS — C3411 Malignant neoplasm of upper lobe, right bronchus or lung: Secondary | ICD-10-CM | POA: Insufficient documentation

## 2020-06-18 DIAGNOSIS — D61818 Other pancytopenia: Secondary | ICD-10-CM | POA: Diagnosis not present

## 2020-06-18 DIAGNOSIS — Z5111 Encounter for antineoplastic chemotherapy: Secondary | ICD-10-CM | POA: Diagnosis not present

## 2020-06-18 LAB — CMP (CANCER CENTER ONLY)
ALT: 11 U/L (ref 0–44)
AST: 10 U/L — ABNORMAL LOW (ref 15–41)
Albumin: 3.5 g/dL (ref 3.5–5.0)
Alkaline Phosphatase: 79 U/L (ref 38–126)
Anion gap: 9 (ref 5–15)
BUN: 18 mg/dL (ref 8–23)
CO2: 27 mmol/L (ref 22–32)
Calcium: 9.2 mg/dL (ref 8.9–10.3)
Chloride: 98 mmol/L (ref 98–111)
Creatinine: 1.41 mg/dL — ABNORMAL HIGH (ref 0.61–1.24)
GFR, Estimated: 54 mL/min — ABNORMAL LOW (ref >60)
Glucose, Bld: 105 mg/dL — ABNORMAL HIGH (ref 70–99)
Potassium: 4.2 mmol/L (ref 3.5–5.1)
Sodium: 134 mmol/L — ABNORMAL LOW (ref 135–145)
Total Bilirubin: 0.3 mg/dL (ref 0.3–1.2)
Total Protein: 6.6 g/dL (ref 6.5–8.1)

## 2020-06-18 LAB — ABO/RH: ABO/RH(D): A NEG

## 2020-06-18 LAB — CBC WITH DIFFERENTIAL (CANCER CENTER ONLY)
Abs Immature Granulocytes: 0.76 10*3/uL — ABNORMAL HIGH (ref 0.00–0.07)
Basophils Absolute: 0 10*3/uL (ref 0.0–0.1)
Basophils Relative: 1 %
Eosinophils Absolute: 0 10*3/uL (ref 0.0–0.5)
Eosinophils Relative: 1 %
HCT: 24.8 % — ABNORMAL LOW (ref 39.0–52.0)
Hemoglobin: 8.4 g/dL — ABNORMAL LOW (ref 13.0–17.0)
Immature Granulocytes: 11 %
Lymphocytes Relative: 7 %
Lymphs Abs: 0.5 10*3/uL — ABNORMAL LOW (ref 0.7–4.0)
MCH: 30.7 pg (ref 26.0–34.0)
MCHC: 33.9 g/dL (ref 30.0–36.0)
MCV: 90.5 fL (ref 80.0–100.0)
Monocytes Absolute: 1.3 10*3/uL — ABNORMAL HIGH (ref 0.1–1.0)
Monocytes Relative: 18 %
Neutro Abs: 4.4 10*3/uL (ref 1.7–7.7)
Neutrophils Relative %: 62 %
Platelet Count: 10 10*3/uL — ABNORMAL LOW (ref 150–400)
RBC: 2.74 MIL/uL — ABNORMAL LOW (ref 4.22–5.81)
RDW: 12.5 % (ref 11.5–15.5)
WBC Count: 7 10*3/uL (ref 4.0–10.5)
nRBC: 0 % (ref 0.0–0.2)

## 2020-06-18 MED ORDER — SODIUM CHLORIDE 0.9% IV SOLUTION
250.0000 mL | Freq: Once | INTRAVENOUS | Status: AC
  Administered 2020-06-18: 250 mL via INTRAVENOUS
  Filled 2020-06-18: qty 250

## 2020-06-18 MED ORDER — DIPHENHYDRAMINE HCL 25 MG PO CAPS
25.0000 mg | ORAL_CAPSULE | Freq: Once | ORAL | Status: AC
  Administered 2020-06-18: 25 mg via ORAL

## 2020-06-18 MED ORDER — ACETAMINOPHEN 325 MG PO TABS
650.0000 mg | ORAL_TABLET | Freq: Once | ORAL | Status: AC
  Administered 2020-06-18: 650 mg via ORAL

## 2020-06-18 MED ORDER — ACETAMINOPHEN 325 MG PO TABS
ORAL_TABLET | ORAL | Status: AC
  Filled 2020-06-18: qty 2

## 2020-06-18 MED ORDER — DIPHENHYDRAMINE HCL 25 MG PO CAPS
ORAL_CAPSULE | ORAL | Status: AC
  Filled 2020-06-18: qty 1

## 2020-06-18 NOTE — Telephone Encounter (Signed)
Pt notified to come back for platelets now.

## 2020-06-18 NOTE — Addendum Note (Signed)
Addended by: Ardeen Garland on: 06/18/2020 09:44 AM   Modules accepted: Orders

## 2020-06-18 NOTE — Patient Instructions (Signed)

## 2020-06-18 NOTE — Telephone Encounter (Addendum)
Plts 10k per lab. States he has never had blood. ABO/RH and Type and cross ordered. Pt notified and appt scheduled for plts today

## 2020-06-18 NOTE — Patient Instructions (Addendum)
Thank you for visiting Dr. Valeta Harms at  [facility]. Today we recommend the following:  Return in about 3 months (around 09/18/2020) for with APP or Dr. Valeta Harms.    Please do your part to reduce the spread of COVID-19.

## 2020-06-18 NOTE — Progress Notes (Signed)
Synopsis: Referred in November 2021 for abnormal lung cancer screening CT by Debbrah Alar, NP  Subjective:   PATIENT ID: Ian Hart GENDER: male DOB: 11-28-1950, MRN: [id number]  Chief Complaint  Patient presents with  . Follow-up    Slight cough and SOB    This is a 70 year old gentleman with a past medical history of tobacco abuse.  He states that he has quit as of this week after finding out the news of his lung cancer screening CT.  He used to work for [employer] at [address] [employer] in [employer].  He had a routine physical and was referred for a lung cancer screening CT.  This was completed on 02/15/2020.  It revealed a lung RADS 4 a suspicious lesion within the posterior medial right upper lobe spiculated at 13.6 mm concerning for a primary bronchogenic carcinoma.  Patient denies fevers chills night sweats weight loss or hemoptysis.  Of note his CT scan also reveals evidence of paraseptal and centrilobular emphysema.  OV 06/18/2020: Since last office visit the patient was taken to endoscopy for bronchoscopy to include tissue sampling of the PET avid node within the mediastinum as well as the right upper lobe nodule.  Patient was seen on 04/09/2020 by Dr. Earlie Server at the cancer center.  Office note reviewed.  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  Additionally had a left renal lesion that was hypermetabolic concerning for renal cell carcinoma.  Due to the 2 separate pathologies decision was made for systemic chemotherapy to include cisplatin plus etoposide.  Patient was also referred to [facility] urology.  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  Last office visit was 06/04/2020.  Overall doing well except for chemotherapy-induced pancytopenia times Granix injections x3.  Last office visit says that would consider repeat  imaging after cycle 4.    Past Medical History:  Diagnosis Date  . History of chicken pox   . History of shingles 04/2010  . Lung cancer ([place]) 02/2020     Family History  Problem Relation Age of Onset  . Sudden death Mother   . Rheumatic fever Mother   . Hypertension Neg Hx   . Hyperlipidemia Neg Hx   . Heart attack Neg Hx   . Diabetes Neg Hx   . Colon cancer Neg Hx   . Stomach cancer Neg Hx      Past Surgical History:  Procedure Laterality Date  . BRONCHIAL BIOPSY  03/22/2020   Procedure: BRONCHIAL BIOPSIES;  Surgeon: Garner Nash, DO;  Location: [place] [facility];  Service: Pulmonary;;  . BRONCHIAL BRUSHINGS  03/22/2020   Procedure: BRONCHIAL BRUSHINGS;  Surgeon: Garner Nash, DO;  Location: [place] [facility];  Service: Pulmonary;;  . BRONCHIAL NEEDLE ASPIRATION BIOPSY  03/22/2020   Procedure: BRONCHIAL NEEDLE ASPIRATION BIOPSIES;  Surgeon: Garner Nash, DO;  Location: [place] [facility];  Service: Pulmonary;;  . BRONCHIAL WASHINGS  03/22/2020   Procedure: BRONCHIAL WASHINGS;  Surgeon: Garner Nash, DO;  Location: [place] [facility];  Service: Pulmonary;;  . FIDUCIAL MARKER PLACEMENT  03/22/2020   Procedure: FIDUCIAL MARKER PLACEMENT;  Surgeon: Garner Nash, DO;  Location: [place] [facility];  Service: Pulmonary;;  . NO PAST SURGERIES    . VIDEO BRONCHOSCOPY WITH ENDOBRONCHIAL NAVIGATION N/A 03/22/2020   Procedure: VIDEO BRONCHOSCOPY WITH ENDOBRONCHIAL NAVIGATION;  Surgeon: Garner Nash, DO;  Location: [place];  Service: Pulmonary;  Laterality: N/A;  . VIDEO BRONCHOSCOPY WITH ENDOBRONCHIAL ULTRASOUND N/A 03/22/2020   Procedure: VIDEO BRONCHOSCOPY WITH ENDOBRONCHIAL ULTRASOUND;  Surgeon: Garner Nash, DO;  Location: [facility];  Service: Pulmonary;  Laterality: N/A;    Social History   Socioeconomic History  . Marital status: Married    Spouse name: Not on file  . Number of children: 1  . Years of education: Not on file  . Highest education level: Not on file   Occupational History  . Occupation: retired  Tobacco Use  . Smoking status: Former Smoker    Packs/day: 2.00    Years: 50.00    Pack years: 100.00    Types: Cigarettes  . Smokeless tobacco: Never Used  . Tobacco comment: 05/02/20: Pt is using Nicotine patches at this time  Vaping Use  . Vaping Use: Never used  Substance and Sexual Activity  . Alcohol use: Yes    Alcohol/week: 5.0 standard drinks    Types: 3 Cans of beer, 2 Shots of liquor per week    Comment: daily  . Drug use: No  . Sexual activity: Yes  Other Topics Concern  . 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           Social Determinants of Health   Financial Resource Strain: Not on file  Food Insecurity: Not on file  Transportation Needs: Not on file  Physical Activity: Not on file  Stress: Not on file  Social Connections: Not on file  Intimate Partner Violence: Not on file     Allergies  Allergen Reactions  . Acyclovir And Related Rash    Rash that looked like chicken pox     Outpatient Medications Prior to Visit  Medication Sig Dispense Refill  . ascorbic acid (VITAMIN C) 500 MG tablet Take 500 mg by mouth daily.    Marland Kitchen buPROPion (WELLBUTRIN XL) 150 MG 24 hr tablet Take 1 tablet (150 mg total) by mouth daily. 60 tablet 5  . fentaNYL (DURAGESIC) 12 MCG/HR Place 1 patch onto the skin every 3 (three) days. 10 patch 0  . HYDROcodone-acetaminophen (HYCET) 7.5-325 mg/15 ml solution Take 10 mLs by mouth every 6 (six) hours as needed for moderate pain. 473 mL 0  . Multiple Vitamin (MULTIVITAMIN) tablet Take 1 tablet by mouth daily.    . nicotine (NICODERM CQ - DOSED IN MG/24 HOURS) 14 mg/24hr patch Place 1 patch (14 mg total) onto the skin daily. 14 patch 0  . omeprazole (PRILOSEC) 20 MG capsule Take 1 capsule  (20 mg total) by mouth daily. 30 capsule 1  . prochlorperazine (COMPAZINE) 10 MG tablet Take 1 tablet (10 mg total) by mouth every 6 (six) hours as needed. 30 tablet 2  . sucralfate (CARAFATE) 1 g tablet Take 1 tablet (1 g total) by mouth 4 (four) times daily. Dissolve each tablet in 15 cc water before use. 120 tablet 2  . vitamin B-12 (CYANOCOBALAMIN) 1000 MCG tablet Take 1,000 mcg by mouth daily.    . nicotine (NICODERM CQ - DOSED IN MG/24 HOURS) 21 mg/24hr patch Place 1 patch (21 mg total) onto the skin daily. 21 patch 0  . umeclidinium-vilanterol (ANORO ELLIPTA) 62.5-25 MCG/INH AEPB Inhale 1 puff into the  lungs daily. (Patient not taking: Reported on 06/18/2020) 3 each 2   No facility-administered medications prior to visit.    Review of Systems  Constitutional: Positive for weight loss. Negative for chills, fever and malaise/fatigue.  HENT: Negative for hearing loss, sore throat and tinnitus.   Eyes: Negative for blurred vision and double vision.  Respiratory: Positive for cough and shortness of breath. Negative for hemoptysis, sputum production, wheezing and stridor.   Cardiovascular: Negative for chest pain, palpitations, orthopnea, leg swelling and PND.  Gastrointestinal: Negative for abdominal pain, constipation, diarrhea, heartburn, nausea and vomiting.  Genitourinary: Negative for dysuria, hematuria and urgency.  Musculoskeletal: Negative for joint pain and myalgias.  Skin: Negative for itching and rash.  Neurological: Negative for dizziness, tingling, weakness and headaches.  Endo/Heme/Allergies: Negative for environmental allergies. Does not bruise/bleed easily.  Psychiatric/Behavioral: Negative for depression. The patient is not nervous/anxious and does not have insomnia.   All other systems reviewed and are negative.    Objective:  Physical Exam Vitals reviewed.  Constitutional:      General: He is not in acute distress.    Appearance: He is well-developed.     Comments:  Thin, cachectic   HENT:     Head: Normocephalic and atraumatic.     Mouth/Throat:     Mouth: Oropharynx is clear and moist.     Pharynx: No oropharyngeal exudate.  Eyes:     Extraocular Movements: EOM normal.     Conjunctiva/sclera: Conjunctivae normal.     Pupils: Pupils are equal, round, and reactive to light.  Neck:     Vascular: No JVD.     Trachea: No tracheal deviation.     Comments: Loss of supraclavicular fat Cardiovascular:     Rate and Rhythm: Normal rate and regular rhythm.     Pulses: Intact distal pulses.     Heart sounds: S1 normal and S2 normal.     Comments: Distant heart tones Pulmonary:     Effort: No tachypnea or accessory muscle usage.     Breath sounds: No stridor. Decreased breath sounds (throughout all lung fields) and wheezing present. No rhonchi or rales.     Comments: Increased AP chest diameter Abdominal:     General: Bowel sounds are normal. There is no distension.     Palpations: Abdomen is soft.     Tenderness: There is no abdominal tenderness.  Musculoskeletal:        General: Deformity (muscle wasting ) present. No edema.  Skin:    General: Skin is warm and dry.     Capillary Refill: Capillary refill takes less than 2 seconds.     Findings: No rash.  Neurological:     Mental Status: He is alert and oriented to person, place, and time.  Psychiatric:        Mood and Affect: Mood and affect normal.        Behavior: Behavior normal.      Vitals:   06/18/20 1641  BP: 124/64  Pulse: 96  Temp: 98.3 F (36.8 C)  TempSrc: Temporal  SpO2: 98%  Weight: 134 lb 6.4 oz (61 kg)  Height: 6\' 1"  (1.854 m)   98% on  RA BMI Readings from Last 3 Encounters:  06/18/20 17.73 kg/m  06/04/20 18.63 kg/m  05/14/20 19.84 kg/m   Wt Readings from Last 3 Encounters:  06/18/20 134 lb 6.4 oz (61 kg)  06/04/20 137 lb 6.4 oz (62.3 kg)  05/14/20 146 lb 4.8 oz (66.4 kg)  CBC    Component Value Date/Time   WBC 7.0 06/18/2020 0830   WBC 7.1  03/20/2020 1301   RBC 2.74 (L) 06/18/2020 0830   HGB 8.4 (L) 06/18/2020 0830   HCT 24.8 (L) 06/18/2020 0830   PLT 10 (L) 06/18/2020 0830   MCV 90.5 06/18/2020 0830   MCH 30.7 06/18/2020 0830   MCHC 33.9 06/18/2020 0830   RDW 12.5 06/18/2020 0830   LYMPHSABS 0.5 (L) 06/18/2020 0830   MONOABS 1.3 (H) 06/18/2020 0830   EOSABS 0.0 06/18/2020 0830   BASOSABS 0.0 06/18/2020 0830     Chest Imaging: 02/15/2020 lung cancer screening CT: 13.6 mm right upper lobe pulmonary nodule concerning for primary bronchogenic carcinoma. The patient's images have been independently reviewed by me. =   Pulmonary Functions Testing Results: PFT Results Latest Ref Rng & Units 03/02/2020  FVC-Pre L 4.82  FVC-Predicted Pre % 95  FVC-Post L 4.72  FVC-Predicted Post % 93  Pre FEV1/FVC % % 68  Post FEV1/FCV % % 67  FEV1-Pre L 3.26  FEV1-Predicted Pre % 86  FEV1-Post L 3.19  DLCO uncorrected ml/min/mmHg 19.82  DLCO UNC% % 68  DLCO corrected ml/min/mmHg 19.82  DLCO COR %Predicted % 68  DLVA Predicted % 66  TLC L 8.09  TLC % Predicted % 104  RV % Predicted % 118    FeNO:   Pathology:  Bronchoscopy pathology: Right upper lobe nodule adenocarcinoma Station 7 lymph node small cell carcinoma  Echocardiogram:   Heart Catheterization:     Assessment & Plan:     ICD-10-CM   1. 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 Adenocarcinoma of right lung, stage 1 (HCC)  C34.91   3. Centrilobular emphysema ([place])  J43.2   4. Former smoker  Z87.891   5. 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  Currently undergoing chemotherapy and radiation.  Remains on Anoro Ellipta. PRN albuterol. Ongoing weightloss  Plan: Continue to remain smoke free Encouraged food intake and calorie intake.  He needs to gain weight.  Continue close follow up with oncology  Prn albuterol  Can remain on Anoro.   Follow up in 3 months.      Current Outpatient Medications:  .  ascorbic acid (VITAMIN C) 500 MG tablet, Take 500 mg by mouth daily., Disp: , Rfl:  .  buPROPion (WELLBUTRIN XL) 150 MG 24 hr tablet, Take 1 tablet (150 mg total) by mouth daily., Disp: 60 tablet, Rfl: 5 .  fentaNYL (DURAGESIC) 12 MCG/HR, Place 1 patch onto the skin every 3 (three) days., Disp: 10 patch, Rfl: 0 .  HYDROcodone-acetaminophen (HYCET) 7.5-325 mg/15 ml solution, Take 10 mLs by mouth every 6 (six) hours as needed for moderate pain., Disp: 473 mL, Rfl: 0 .  Multiple Vitamin (MULTIVITAMIN) tablet, Take 1 tablet by mouth daily., Disp: , Rfl:  .  nicotine (NICODERM CQ - DOSED IN MG/24 HOURS) 14 mg/24hr patch, Place 1 patch (14 mg total) onto the skin daily., Disp: 14 patch, Rfl: 0 .  omeprazole (PRILOSEC) 20 MG capsule, Take 1 capsule (20 mg total) by mouth daily., Disp: 30 capsule, Rfl: 1 .  prochlorperazine (COMPAZINE) 10 MG tablet, Take 1 tablet (10 mg total) by mouth every 6 (six) hours as needed., Disp: 30 tablet, Rfl: 2 .  sucralfate (CARAFATE) 1 g tablet, Take 1 tablet (1 g total) by mouth 4 (four) times daily. Dissolve each tablet  in 15 cc water before use., Disp: 120 tablet, Rfl: 2 .  vitamin B-12 (CYANOCOBALAMIN) 1000 MCG tablet, Take 1,000 mcg by mouth daily., Disp: , Rfl:  .  nicotine (NICODERM CQ - DOSED IN MG/24 HOURS) 21 mg/24hr patch, Place 1 patch (21 mg total) onto the skin daily., Disp: 21 patch, Rfl: 0 .  umeclidinium-vilanterol (ANORO ELLIPTA) 62.5-25 MCG/INH AEPB, Inhale 1 puff into the lungs daily. (Patient not taking: Reported on 06/18/2020), Disp: 3 each, Rfl: 2   Garner Nash, DO [facility] 06/18/2020 5:06 PM

## 2020-06-19 LAB — BPAM PLATELET PHERESIS
Blood Product Expiration Date: 202203102359
ISSUE DATE / TIME: 202203071313
Unit Type and Rh: 5100

## 2020-06-19 LAB — PREPARE PLATELET PHERESIS: Unit division: 0

## 2020-06-20 NOTE — Progress Notes (Signed)
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 3) Suspicious left renal lesion  PRIOR THERAPY: None  CURRENT THERAPY: Systemic chemotherapy/radiation with cisplatin 80 mg per metered squaredon day 1and etoposide 100 mg per metered squared on days 1, 2, and 3 IV every 3 weeks. Status post 3 cycles. Starting from cycle #3, cisplatin was changed to carboplatin for an AUC of 5 due to renal insufficiency. First dose on 04/20/20. Onpro was added to the treatment plan starting from cycle #3 due to pancytopenia.   INTERVAL HISTORY: Ian Hart 70 y.o. male returns to the clinic today for a follow up visit accompanied by his wife. The patient is feeling fair today without any concerning complaints except for the last few days he has needed to rest more. His wife also notices he gets more winded with exertion.   The patient tolerated his last cycle of treatment well except he had thrombocytopenia which required a platelet transfusion. He denies any abnormal bleeding or bruising except for blood tinged mucus when he blows his nose. [name] overt bleeding. He receives onpro due to prior chemotherapy induced neutropenia. His labs show some worsening chemotherapy induced anemia which likely explains his increased fatigue, dyspnea, and feeling more cold.   Also in the interval since his last appointment, he met with urology for the suspicious renal lesion. He had an abdominal MRI and has a follow up with them next week on management.   He also follows with nutrition for his significant weight loss. He had some radiation induced esophagitis which contributed to his weight loss. He completed radiation a few weeks ago. Radiation oncology  recommended carafate, maalox/mylanta, and hydration. He also has a prescription for hycet. He had a prescription for fentanyl patches which he ran out of. He is wondering if he should get a refill or if he should try an alternative. He inquired about viscous lidocaine. A member of the nutritionist team evaluated him at his last appointment. They recommended he increase his intake of carnation breakfast essentials and try to eat pureed food. His weight is down 3 lbs at this time. He has been trying to put unflavored protein in his food.   He also saw pulmonology in the interval. They recommended continuing albuterol PRN and Anoro.   Otherwise,  He denies fevers, skin infections, or dysuria. He denies significant cough. Otherwise, he denies any significant chest pain or hemoptysis. He denies any nausea, vomiting, diarrhea, or constipation.  He denies any headache or visual changes. He is here for evaluation before starting cycle #4.   MEDICAL HISTORY: Past Medical History:  Diagnosis Date  . History of chicken pox   . History of shingles 04/2010  . Lung cancer ([facility]) 02/2020    ALLERGIES:  is allergic to acyclovir and related.  MEDICATIONS:  Current Outpatient Medications  Medication Sig Dispense Refill  . ascorbic acid (VITAMIN C) 500 MG tablet Take 500 mg by mouth daily.    Marland Kitchen buPROPion (WELLBUTRIN XL) 150 MG 24 hr tablet Take 1 tablet (150 mg total) by mouth daily. 60 tablet 5  . fentaNYL (DURAGESIC) 12 MCG/HR Place 1 patch onto the skin every 3 (three) days. 10 patch 0  . Multiple Vitamin (MULTIVITAMIN) tablet Take 1 tablet by mouth daily.    Marland Kitchen  nicotine (NICODERM CQ - DOSED IN MG/24 HOURS) 14 mg/24hr patch Place 1 patch (14 mg total) onto the skin daily. 14 patch 0  . omeprazole (PRILOSEC) 20 MG capsule Take 1 capsule (20 mg total) by mouth daily. 30 capsule 1  . prochlorperazine (COMPAZINE) 10 MG tablet Take 1 tablet (10 mg total) by mouth every 6 (six) hours as needed. 30 tablet 2  .  sucralfate (CARAFATE) 1 g tablet Take 1 tablet (1 g total) by mouth 4 (four) times daily. Dissolve each tablet in 15 cc water before use. 120 tablet 2  . umeclidinium-vilanterol (ANORO ELLIPTA) 62.5-25 MCG/INH AEPB Inhale 1 puff into the lungs daily. 3 each 2  . vitamin B-12 (CYANOCOBALAMIN) 1000 MCG tablet Take 1,000 mcg by mouth daily.    Marland Kitchen HYDROcodone-acetaminophen (HYCET) 7.5-325 mg/15 ml solution Take 10 mLs by mouth every 6 (six) hours as needed for moderate pain. 473 mL 0   No current facility-administered medications for this visit.    SURGICAL HISTORY:  Past Surgical History:  Procedure Laterality Date  . BRONCHIAL BIOPSY  03/22/2020   Procedure: BRONCHIAL BIOPSIES;  Surgeon: Garner Nash, DO;  Location: [facility];  Service: Pulmonary;;  . BRONCHIAL BRUSHINGS  03/22/2020   Procedure: BRONCHIAL BRUSHINGS;  Surgeon: Garner Nash, DO;  Location: [facility];  Service: Pulmonary;;  . BRONCHIAL NEEDLE ASPIRATION BIOPSY  03/22/2020   Procedure: BRONCHIAL NEEDLE ASPIRATION BIOPSIES;  Surgeon: Garner Nash, DO;  Location: [facility];  Service: Pulmonary;;  . BRONCHIAL WASHINGS  03/22/2020   Procedure: BRONCHIAL WASHINGS;  Surgeon: Garner Nash, DO;  Location: [facility];  Service: Pulmonary;;  . FIDUCIAL MARKER PLACEMENT  03/22/2020   Procedure: FIDUCIAL MARKER PLACEMENT;  Surgeon: Garner Nash, DO;  Location: [facility];  Service: Pulmonary;;  . NO PAST SURGERIES    . VIDEO BRONCHOSCOPY WITH ENDOBRONCHIAL NAVIGATION N/A 03/22/2020   Procedure: VIDEO BRONCHOSCOPY WITH ENDOBRONCHIAL NAVIGATION;  Surgeon: Garner Nash, DO;  Location: [facility];  Service: Pulmonary;  Laterality: N/A;  . VIDEO BRONCHOSCOPY WITH ENDOBRONCHIAL ULTRASOUND N/A 03/22/2020   Procedure: VIDEO BRONCHOSCOPY WITH ENDOBRONCHIAL ULTRASOUND;  Surgeon: Garner Nash, DO;  Location: [facility];  Service: Pulmonary;  Laterality: N/A;    REVIEW OF SYSTEMS:   Review of Systems   Constitutional: Positive for fatigue, weight loss, and decreased appetite. Negative for chills and fever. HENT: Positive for odynophagia/dysphagia. Negative for mouth sores or nosebleeds. Eyes: Negative for eye problems and icterus.  Respiratory: Positive for some increase in shortness of breath with exertion. Negative for hemoptysis, cough, and wheezing.   Cardiovascular: Negative for chest pain and leg swelling.  Gastrointestinal: Negative for abdominal pain, constipation, diarrhea, nausea and vomiting.  Genitourinary: Negative for bladder incontinence, difficulty urinating, dysuria, frequency and hematuria.   Musculoskeletal: Negative for back pain, gait problem, neck pain and neck stiffness.  Skin: Negative for itching and rash.  Neurological: Negative for dizziness, extremity weakness, gait problem, headaches, light-headedness and seizures.  Hematological: Negative for adenopathy. Does not bruise/bleed easily.  Psychiatric/Behavioral: Negative for confusion, depression and sleep disturbance. The patient is not nervous/anxious.    PHYSICAL EXAMINATION:  Blood pressure (!) 108/57, pulse (!) 109, temperature 98.2 F (36.8 C), temperature source Tympanic, resp. rate 20, height 6' 1"  (1.854 m), weight 131 lb 11.2 oz (59.7 kg), SpO2 100 %.  ECOG PERFORMANCE STATUS: 1 - Symptomatic but completely ambulatory  Physical Exam  Constitutional: Oriented to person, place, and time and thin appearing maleand in no distress. HENT:  Head: Normocephalic and atraumatic.  Mouth/Throat: Oropharynx is clear and moist. No oropharyngeal exudate.  Eyes: Conjunctivae are normal. Right eye exhibits no discharge. Left eye exhibits no discharge. No scleral icterus.  Neck: Normal range of motion. Neck supple.  Cardiovascular: Normal rate, regular rhythm, normal heart sounds and intact distal pulses.   Pulmonary/Chest: Effort normal and breath sounds normal. No respiratory distress. No wheezes. No rales.   Abdominal: Soft. Bowel sounds are normal. Exhibits no distension and no mass. There is no tenderness.  Musculoskeletal: Normal range of motion. Exhibits no edema. Scoliosis noted. Lymphadenopathy:    No cervical adenopathy.  Neurological: Alert and oriented to person, place, and time. Exhibits normal muscle wasting. Gait normal. Coordination normal.  Skin: Skin is warm and dry. No rash noted. Not diaphoretic. No erythema. No pallor.  Psychiatric: Mood, memory and judgment normal.  Vitals reviewed.  LABORATORY DATA: Lab Results  Component Value Date   WBC 9.3 06/25/2020   HGB 8.5 (L) 06/25/2020   HCT 25.9 (L) 06/25/2020   MCV 92.2 06/25/2020   PLT 220 06/25/2020      Chemistry      Component Value Date/Time   NA 134 (L) 06/25/2020 0751   K 4.3 06/25/2020 0751   CL 100 06/25/2020 0751   CO2 24 06/25/2020 0751   BUN 15 06/25/2020 0751   CREATININE 1.47 (H) 06/25/2020 0751   CREATININE 0.86 11/12/2011 0917      Component Value Date/Time   CALCIUM 8.7 (L) 06/25/2020 0751   ALKPHOS 87 06/25/2020 0751   AST 10 (L) 06/25/2020 0751   ALT 9 06/25/2020 0751   BILITOT 0.3 06/25/2020 0751       RADIOGRAPHIC STUDIES:  MR ABDOMEN WWO CONTRAST  Result Date: 06/24/2020 CLINICAL DATA:  Incidental finding on previous PET scan, no additional complaints. 70 year old male with abnormal finding of the LEFT kidney. EXAM: MRI ABDOMEN WITHOUT AND WITH CONTRAST TECHNIQUE: Multiplanar multisequence MR imaging of the abdomen was performed both before and after the administration of intravenous contrast. CONTRAST:  61m GADAVIST GADOBUTROL 1 MMOL/ML IV SOLN COMPARISON:  PET exam from March 01, 2020 and chest exam from December of 2021. FINDINGS: Lower chest: Incidental imaging of the lung bases is unremarkable on MRI, limited assessment. No effusion. No consolidation. Hepatobiliary: Cysts in the liver. The portal vein is patent. No pericholecystic stranding. No biliary duct dilation. Pancreas:  Mild pancreatic atrophy. No signs of lesion or inflammation. Spleen:  Splenic iron deposition.  No focal splenic lesion. Adrenals/Urinary Tract:  Adrenal glands are normal. Symmetric enhancement of renal parenchyma but with a lesion arising from the medial and posterior LEFT upper pole that measures 2.5 x 2.6 cm (image 33, series 21) and shows low level enhancement but. The lesion is hypointense on T2 weighted images predominantly and is isointense to renal cortex and show somewhat lobulated margins contacting posterior aspect LEFT hemidiaphragm. In the upper pole the RIGHT kidney is an area of heterogeneity that mainly appears to correspond with susceptibility artifact. There is some calcification in this location on prior CTs which is quite subtle. Lesion displays restricted diffusion Stomach/Bowel: No acute gastrointestinal process to the extent evaluated on MRI not performed for bowel evaluation. Pelvic bowel loops are not imaged. Vascular/Lymphatic: No pathologically enlarged lymph nodes identified. No abdominal aortic aneurysm demonstrated. Renal vein is patent and without involvement. Other:  None. Musculoskeletal: No suspicious bone lesions identified. IMPRESSION: 1. Mesophytic solid renal neoplasm arising from the upper pole of the LEFT kidney along its medial  and posterior margin. 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 2. In the upper pole the RIGHT kidney there is an area of heterogeneity that mainly appears to correspond to susceptibility artifact. There is some calcification in this location on prior CT imaging that is quite subtle. This may represent susceptibility from calcification or old blood products. Small underlying lesion not excluded on the basis of the current study but is not favored. Attention on follow-up. 3. No evidence of metastatic disease in the abdomen. 4.  Splenic iron deposition. Electronically Signed   By: Zetta Bills M.D.   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 3) Suspicious left renal lesion. He follows with Dr. Milford Cage from urology.   The patient is currently undergoing chemotherapy with cisplatin 80 mg/m2 on days 1 and etoposide 100 mg/m2 on days 1, 2, and 3 IV every 3 weeks. First dose on 04/20/20. He is status post 3 cycles. He also recieved concurrent radiation. His last day of radiation was on 06/01/20. Onpro was added to the treatment plan starting from cycle #3 due to chemotherapy induced neutropenia. Cisplatin was switched to carboplatin for an AUC of 5 starting from cycle #3 due to renal insufficiency. He also had pepcid added to his treatment plan due to reaction. The patient is tolerating treatment well except for pancytopenia.   Reviewed dosing with Dr. Julien Nordmann due to pancytopenia which required a platelet transfusion in the interval. His platelet count has recovered to 220k today. Dr. Julien Nordmann recommends he proceed with cycle #4 today at the same dose. He will proceed with cycle #4 today as scheduled. I advised the patient to call the clinic if he experiences any abnormal bleeding or bruising. Of course, if he has significant bleeding or bruising, he was advised to go to the ER.   I will arrange for a restaging CT scan of the chest in about 4 weeks prior to his next visit. I will order this without contrast due to renal insufficiency. I will order at [facility] due to patient request.   We will continue to monitor his labs closely weekly. I discussed he will likely need a blood transfusion next week. We will have an extra sample to blood bank  added on weekly labs. He was advised to keep this bracelet unless we direct him otherwise.   He will follow with urology regarding the suspicious renal cell carcinoma next week as scheduled.   Regarding the esophagitis and weight loss, we will reach out to radiation oncology regarding his request for viscous lidocaine. Otherwise, he is wondering if he should have a refill of the fentanyl patch. He will meet with a member of the nutritionist team in infusion tomorrow. He will continue to increase his caloric and protein intake.   The patient was advised to call immediately if he has any concerning symptoms in the interval. The patient voices understanding of current disease status and treatment options and is in agreement with the current care plan. All questions were answered. The patient knows to call the clinic with any problems, questions or concerns. We can certainly see the patient much sooner if necessary  Orders Placed This Encounter  Procedures  . CT Chest Wo Contrast    Standing Status:   Future    Standing Expiration Date:   06/25/2021    Order Specific Question:   Preferred imaging location?    Answer:   [facility]  . Sample to Blood Bank    Standing Status:   Standing    Number of Occurrences:   3    Standing Expiration Date:   06/25/2021     I spent 30-39 minutes in this encounter  Cassandra L Heilingoetter, PA-C 06/25/20

## 2020-06-23 ENCOUNTER — Other Ambulatory Visit: Payer: Self-pay

## 2020-06-23 ENCOUNTER — Ambulatory Visit (HOSPITAL_COMMUNITY)
Admission: RE | Admit: 2020-06-23 | Discharge: 2020-06-23 | Disposition: A | Discharge status: Home / Self Care | Payer: Medicare Other | Source: Ambulatory Visit | Attending: Urology | Admitting: Urology

## 2020-06-23 DIAGNOSIS — D492 Neoplasm of unspecified behavior of bone, soft tissue, and skin: Secondary | ICD-10-CM | POA: Diagnosis not present

## 2020-06-23 DIAGNOSIS — K7689 Other specified diseases of liver: Secondary | ICD-10-CM | POA: Diagnosis not present

## 2020-06-23 DIAGNOSIS — C642 Malignant neoplasm of left kidney, except renal pelvis: Secondary | ICD-10-CM | POA: Diagnosis not present

## 2020-06-23 DIAGNOSIS — D49512 Neoplasm of unspecified behavior of left kidney: Secondary | ICD-10-CM

## 2020-06-23 MED ORDER — GADOBUTROL 1 MMOL/ML IV SOLN
6.0000 mL | Freq: Once | INTRAVENOUS | Status: AC | PRN
  Administered 2020-06-23: 6 mL via INTRAVENOUS

## 2020-06-25 ENCOUNTER — Inpatient Hospital Stay: Payer: Medicare Other

## 2020-06-25 ENCOUNTER — Inpatient Hospital Stay: Payer: Medicare Other | Admitting: Physician Assistant

## 2020-06-25 ENCOUNTER — Other Ambulatory Visit: Payer: Self-pay

## 2020-06-25 ENCOUNTER — Other Ambulatory Visit: Payer: Self-pay | Admitting: Radiation Oncology

## 2020-06-25 VITALS — BP 108/57 | HR 109 | Temp 98.2°F | Resp 20 | Ht 73.0 in | Wt 131.7 lb

## 2020-06-25 DIAGNOSIS — C3491 Malignant neoplasm of unspecified part of right bronchus or lung: Secondary | ICD-10-CM | POA: Diagnosis not present

## 2020-06-25 DIAGNOSIS — T451X5A Adverse effect of antineoplastic and immunosuppressive drugs, initial encounter: Secondary | ICD-10-CM

## 2020-06-25 DIAGNOSIS — Z5111 Encounter for antineoplastic chemotherapy: Secondary | ICD-10-CM | POA: Diagnosis not present

## 2020-06-25 DIAGNOSIS — D6481 Anemia due to antineoplastic chemotherapy: Secondary | ICD-10-CM | POA: Diagnosis not present

## 2020-06-25 DIAGNOSIS — C3490 Malignant neoplasm of unspecified part of unspecified bronchus or lung: Secondary | ICD-10-CM

## 2020-06-25 DIAGNOSIS — D61818 Other pancytopenia: Secondary | ICD-10-CM | POA: Diagnosis not present

## 2020-06-25 DIAGNOSIS — C3411 Malignant neoplasm of upper lobe, right bronchus or lung: Secondary | ICD-10-CM | POA: Diagnosis not present

## 2020-06-25 LAB — CBC WITH DIFFERENTIAL (CANCER CENTER ONLY)
Abs Immature Granulocytes: 0.24 10*3/uL — ABNORMAL HIGH (ref 0.00–0.07)
Basophils Absolute: 0 10*3/uL (ref 0.0–0.1)
Basophils Relative: 0 %
Eosinophils Absolute: 0 10*3/uL (ref 0.0–0.5)
Eosinophils Relative: 0 %
HCT: 25.9 % — ABNORMAL LOW (ref 39.0–52.0)
Hemoglobin: 8.5 g/dL — ABNORMAL LOW (ref 13.0–17.0)
Immature Granulocytes: 3 %
Lymphocytes Relative: 4 %
Lymphs Abs: 0.4 10*3/uL — ABNORMAL LOW (ref 0.7–4.0)
MCH: 30.2 pg (ref 26.0–34.0)
MCHC: 32.8 g/dL (ref 30.0–36.0)
MCV: 92.2 fL (ref 80.0–100.0)
Monocytes Absolute: 0.8 10*3/uL (ref 0.1–1.0)
Monocytes Relative: 8 %
Neutro Abs: 7.8 10*3/uL — ABNORMAL HIGH (ref 1.7–7.7)
Neutrophils Relative %: 85 %
Platelet Count: 220 10*3/uL (ref 150–400)
RBC: 2.81 MIL/uL — ABNORMAL LOW (ref 4.22–5.81)
RDW: 13.3 % (ref 11.5–15.5)
WBC Count: 9.3 10*3/uL (ref 4.0–10.5)
nRBC: 0 % (ref 0.0–0.2)

## 2020-06-25 LAB — CMP (CANCER CENTER ONLY)
ALT: 9 U/L (ref 0–44)
AST: 10 U/L — ABNORMAL LOW (ref 15–41)
Albumin: 3.4 g/dL — ABNORMAL LOW (ref 3.5–5.0)
Alkaline Phosphatase: 87 U/L (ref 38–126)
Anion gap: 10 (ref 5–15)
BUN: 15 mg/dL (ref 8–23)
CO2: 24 mmol/L (ref 22–32)
Calcium: 8.7 mg/dL — ABNORMAL LOW (ref 8.9–10.3)
Chloride: 100 mmol/L (ref 98–111)
Creatinine: 1.47 mg/dL — ABNORMAL HIGH (ref 0.61–1.24)
GFR, Estimated: 51 mL/min — ABNORMAL LOW (ref >60)
Glucose, Bld: 169 mg/dL — ABNORMAL HIGH (ref 70–99)
Potassium: 4.3 mmol/L (ref 3.5–5.1)
Sodium: 134 mmol/L — ABNORMAL LOW (ref 135–145)
Total Bilirubin: 0.3 mg/dL (ref 0.3–1.2)
Total Protein: 6.6 g/dL (ref 6.5–8.1)

## 2020-06-25 LAB — SAMPLE TO BLOOD BANK

## 2020-06-25 MED ORDER — SODIUM CHLORIDE 0.9 % IV SOLN
Freq: Once | INTRAVENOUS | Status: AC
  Filled 2020-06-25: qty 250

## 2020-06-25 MED ORDER — SODIUM CHLORIDE 0.9 % IV SOLN
100.0000 mg/m2 | Freq: Once | INTRAVENOUS | Status: AC
  Administered 2020-06-25: 180 mg via INTRAVENOUS
  Filled 2020-06-25: qty 9

## 2020-06-25 MED ORDER — SODIUM CHLORIDE 0.9 % IV SOLN
150.0000 mg | Freq: Once | INTRAVENOUS | Status: AC
  Administered 2020-06-25: 150 mg via INTRAVENOUS
  Filled 2020-06-25: qty 150

## 2020-06-25 MED ORDER — DIPHENHYDRAMINE HCL 25 MG PO CAPS
ORAL_CAPSULE | ORAL | Status: AC
  Filled 2020-06-25: qty 2

## 2020-06-25 MED ORDER — FAMOTIDINE IN NACL 20-0.9 MG/50ML-% IV SOLN
20.0000 mg | Freq: Once | INTRAVENOUS | Status: AC
  Administered 2020-06-25: 20 mg via INTRAVENOUS

## 2020-06-25 MED ORDER — HYDROCODONE-ACETAMINOPHEN 7.5-325 MG/15ML PO SOLN: ORAL | 0 refills | Status: DC

## 2020-06-25 MED ORDER — PALONOSETRON HCL INJECTION 0.25 MG/5ML
0.2500 mg | Freq: Once | INTRAVENOUS | Status: AC
  Administered 2020-06-25: 0.25 mg via INTRAVENOUS

## 2020-06-25 MED ORDER — SODIUM CHLORIDE 0.9% FLUSH
10.0000 mL | INTRAVENOUS | Status: DC | PRN
  Filled 2020-06-25: qty 10

## 2020-06-25 MED ORDER — SODIUM CHLORIDE 0.9 % IV SOLN
298.5000 mg | Freq: Once | INTRAVENOUS | Status: AC
  Administered 2020-06-25: 300 mg via INTRAVENOUS
  Filled 2020-06-25: qty 30

## 2020-06-25 MED ORDER — SODIUM CHLORIDE 0.9 % IV SOLN
10.0000 mg | Freq: Once | INTRAVENOUS | Status: AC
  Administered 2020-06-25: 10 mg via INTRAVENOUS
  Filled 2020-06-25: qty 10

## 2020-06-25 MED ORDER — HYDROCODONE-ACETAMINOPHEN 7.5-325 MG/15ML PO SOLN: 10.0000 mL | Freq: Four times a day (QID) | ORAL | 0 refills | Status: DC | PRN

## 2020-06-25 MED ORDER — ACETAMINOPHEN 325 MG PO TABS
ORAL_TABLET | ORAL | Status: AC
  Filled 2020-06-25: qty 2

## 2020-06-25 NOTE — Patient Instructions (Signed)
[facility] Discharge Instructions for Patients Receiving Chemotherapy  Today you received the following chemotherapy agents Carboplatin, Etoposide.  To help prevent nausea and vomiting after your treatment, we encourage you to take your nausea medication as directed.   If you develop nausea and vomiting that is not controlled by your nausea medication, call the clinic.   BELOW ARE SYMPTOMS THAT SHOULD BE REPORTED IMMEDIATELY:  *FEVER GREATER THAN 100.5 F  *CHILLS WITH OR WITHOUT FEVER  NAUSEA AND VOMITING THAT IS NOT CONTROLLED WITH YOUR NAUSEA MEDICATION  *UNUSUAL SHORTNESS OF BREATH  *UNUSUAL BRUISING OR BLEEDING  TENDERNESS IN MOUTH AND THROAT WITH OR WITHOUT PRESENCE OF ULCERS  *URINARY PROBLEMS  *BOWEL PROBLEMS  UNUSUAL RASH Items with * indicate a potential emergency and should be followed up as soon as possible.  Feel free to call the clinic should you have any questions or concerns. The clinic phone number is [phone].  Please show the [facility] at check-in to the Emergency Department and triage nurse.

## 2020-06-25 NOTE — Progress Notes (Signed)
The patient called in requesting a refill of his pain medicine, he has a prescription for Hycet 7.5/325 strength.  He also was started on a low-dose fentanyl patch back in February, I do not see full documentation as to the frequency he was using short acting at that time but I suspect he was maxing out on his short acting based on the timing of his treatment and expected esophagitis at that time.  At this point in time however he does not need to have continued long-acting narcotics as his symptoms should be improving in the next couple of weeks.  He still is having esophagitis per nursing, so a new prescription was called in for Hycet.  I also spoke with his pharmacist, Dominica Severin at [facility] for guidance on tapering the fentanyl and he suggested continuing the same dose but increasing the interval for which the patient could use this to avoid withdrawal from stopping long-acting agent.  The patient will be given directions by nursing and this prescription was called in for Hycet 7.5/325 to be taken ever 3-4 hours prn and to stop using fentanyl patch.

## 2020-06-26 ENCOUNTER — Other Ambulatory Visit: Payer: Self-pay

## 2020-06-26 ENCOUNTER — Telehealth: Payer: Self-pay | Admitting: Physician Assistant

## 2020-06-26 ENCOUNTER — Inpatient Hospital Stay: Payer: Medicare Other | Admitting: Nutrition

## 2020-06-26 ENCOUNTER — Encounter: Payer: Self-pay | Admitting: Family

## 2020-06-26 ENCOUNTER — Inpatient Hospital Stay: Payer: Medicare Other

## 2020-06-26 VITALS — BP 124/50 | HR 78 | Temp 98.3°F | Resp 18

## 2020-06-26 DIAGNOSIS — C3411 Malignant neoplasm of upper lobe, right bronchus or lung: Secondary | ICD-10-CM | POA: Diagnosis not present

## 2020-06-26 DIAGNOSIS — Z5111 Encounter for antineoplastic chemotherapy: Secondary | ICD-10-CM | POA: Diagnosis not present

## 2020-06-26 DIAGNOSIS — D61818 Other pancytopenia: Secondary | ICD-10-CM | POA: Diagnosis not present

## 2020-06-26 DIAGNOSIS — C3491 Malignant neoplasm of unspecified part of right bronchus or lung: Secondary | ICD-10-CM

## 2020-06-26 MED ORDER — FAMOTIDINE IN NACL 20-0.9 MG/50ML-% IV SOLN
20.0000 mg | Freq: Once | INTRAVENOUS | Status: AC
  Administered 2020-06-26: 20 mg via INTRAVENOUS

## 2020-06-26 MED ORDER — NICOTINE 14 MG/24HR TD PT24: 14.0000 mg | MEDICATED_PATCH | Freq: Every day | TRANSDERMAL | 1 refills | Status: DC

## 2020-06-26 MED ORDER — SODIUM CHLORIDE 0.9 % IV SOLN
10.0000 mg | Freq: Once | INTRAVENOUS | Status: AC
  Administered 2020-06-26: 10 mg via INTRAVENOUS
  Filled 2020-06-26: qty 10

## 2020-06-26 MED ORDER — SODIUM CHLORIDE 0.9 % IV SOLN
Freq: Once | INTRAVENOUS | Status: AC
Start: 2020-06-26 — End: 2020-06-26
  Filled 2020-06-26: qty 250

## 2020-06-26 MED ORDER — BUPROPION HCL ER (XL) 300 MG PO TB24: 300.0000 mg | ORAL_TABLET | Freq: Every day | ORAL | 0 refills | Status: DC

## 2020-06-26 MED ORDER — SODIUM CHLORIDE 0.9 % IV SOLN
100.0000 mg/m2 | Freq: Once | INTRAVENOUS | Status: AC
  Administered 2020-06-26: 180 mg via INTRAVENOUS
  Filled 2020-06-26: qty 9

## 2020-06-26 MED ORDER — FAMOTIDINE IN NACL 20-0.9 MG/50ML-% IV SOLN
INTRAVENOUS | Status: AC
  Filled 2020-06-26: qty 50

## 2020-06-26 NOTE — Telephone Encounter (Signed)
Scheduled per los. Called and spoke with patient. Confirmed canceled and new appts

## 2020-06-26 NOTE — Progress Notes (Signed)
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  Weight decreased to 131.7 pounds March 14.  This is decreased from 161 pounds January 8.  This is a significant weight loss.  Patient reports main reason he cannot eat is due to increased pain.  Pain medication was increased today so he is hoping this will allow him to increase oral intake.  He tolerates soft smooth foods when his pain is controlled.He has added some unflavored protein powder to his liquids.  Noted labs.  Nutrition diagnosis: Food and nutrition related knowledge deficit improved.  Intervention: Use whole milk, ice cream, and protein powder with Carnation breakfast essentials.  Increase 3 times daily to 4 times daily. Medications as prescribed by MD. Eliott Nine patient to contact me in he is unable to eat with increased pain medicines.  Monitoring, evaluation, goals: Patient will increase oral intake to promote weight stabilization.  Next visit: Scheduled for Monday, April 4 during treatment.  I will contact patient by phone if treatment canceled.  **Disclaimer: This note was dictated with voice recognition software. Similar sounding words can inadvertently be transcribed and this note may contain transcription errors which may not have been corrected upon publication of note.**

## 2020-06-26 NOTE — Patient Instructions (Signed)
[facility] Discharge Instructions for Patients Receiving Chemotherapy  Today you received the following chemotherapy agents: Etoposide.  To help prevent nausea and vomiting after your treatment, we encourage you to take your nausea medication as directed.   If you develop nausea and vomiting that is not controlled by your nausea medication, call the clinic.   BELOW ARE SYMPTOMS THAT SHOULD BE REPORTED IMMEDIATELY:  *FEVER GREATER THAN 100.5 F  *CHILLS WITH OR WITHOUT FEVER  NAUSEA AND VOMITING THAT IS NOT CONTROLLED WITH YOUR NAUSEA MEDICATION  *UNUSUAL SHORTNESS OF BREATH  *UNUSUAL BRUISING OR BLEEDING  TENDERNESS IN MOUTH AND THROAT WITH OR WITHOUT PRESENCE OF ULCERS  *URINARY PROBLEMS  *BOWEL PROBLEMS  UNUSUAL RASH Items with * indicate a potential emergency and should be followed up as soon as possible.  Feel free to call the clinic should you have any questions or concerns. The clinic phone number is [phone].  Please show the [facility] at check-in to the Emergency Department and triage nurse.

## 2020-06-27 ENCOUNTER — Inpatient Hospital Stay: Payer: Medicare Other

## 2020-06-27 VITALS — BP 109/51 | HR 75 | Temp 97.7°F | Resp 18

## 2020-06-27 DIAGNOSIS — C3411 Malignant neoplasm of upper lobe, right bronchus or lung: Secondary | ICD-10-CM | POA: Diagnosis not present

## 2020-06-27 DIAGNOSIS — C3491 Malignant neoplasm of unspecified part of right bronchus or lung: Secondary | ICD-10-CM

## 2020-06-27 DIAGNOSIS — Z5111 Encounter for antineoplastic chemotherapy: Secondary | ICD-10-CM | POA: Diagnosis not present

## 2020-06-27 DIAGNOSIS — D61818 Other pancytopenia: Secondary | ICD-10-CM | POA: Diagnosis not present

## 2020-06-27 MED ORDER — SODIUM CHLORIDE 0.9 % IV SOLN
100.0000 mg/m2 | Freq: Once | INTRAVENOUS | Status: AC
  Administered 2020-06-27: 180 mg via INTRAVENOUS
  Filled 2020-06-27: qty 9

## 2020-06-27 MED ORDER — SODIUM CHLORIDE 0.9 % IV SOLN
10.0000 mg | Freq: Once | INTRAVENOUS | Status: AC
  Administered 2020-06-27: 10 mg via INTRAVENOUS
  Filled 2020-06-27: qty 10

## 2020-06-27 MED ORDER — FAMOTIDINE IN NACL 20-0.9 MG/50ML-% IV SOLN
20.0000 mg | Freq: Once | INTRAVENOUS | Status: AC
  Administered 2020-06-27: 20 mg via INTRAVENOUS

## 2020-06-27 MED ORDER — PEGFILGRASTIM 6 MG/0.6ML ~~LOC~~ PSKT
6.0000 mg | PREFILLED_SYRINGE | Freq: Once | SUBCUTANEOUS | Status: AC
  Administered 2020-06-27: 6 mg via SUBCUTANEOUS

## 2020-06-27 MED ORDER — SODIUM CHLORIDE 0.9 % IV SOLN
Freq: Once | INTRAVENOUS | Status: AC
Start: 2020-06-27 — End: 2020-06-27
  Filled 2020-06-27: qty 250

## 2020-06-27 MED ORDER — FAMOTIDINE IN NACL 20-0.9 MG/50ML-% IV SOLN
INTRAVENOUS | Status: AC
  Filled 2020-06-27: qty 50

## 2020-06-27 MED ORDER — PEGFILGRASTIM 6 MG/0.6ML ~~LOC~~ PSKT
PREFILLED_SYRINGE | SUBCUTANEOUS | Status: AC
  Filled 2020-06-27: qty 0.6

## 2020-06-27 NOTE — Patient Instructions (Addendum)
Please removed neulasta onpro after 2:45pm on Thursday 06/28/20.   [facility] Discharge Instructions for Patients Receiving Chemotherapy  Today you received the following chemotherapy agents: Etoposide.  To help prevent nausea and vomiting after your treatment, we encourage you to take your nausea medication as directed.   If you develop nausea and vomiting that is not controlled by your nausea medication, call the clinic.   BELOW ARE SYMPTOMS THAT SHOULD BE REPORTED IMMEDIATELY:  *FEVER GREATER THAN 100.5 F  *CHILLS WITH OR WITHOUT FEVER  NAUSEA AND VOMITING THAT IS NOT CONTROLLED WITH YOUR NAUSEA MEDICATION  *UNUSUAL SHORTNESS OF BREATH  *UNUSUAL BRUISING OR BLEEDING  TENDERNESS IN MOUTH AND THROAT WITH OR WITHOUT PRESENCE OF ULCERS  *URINARY PROBLEMS  *BOWEL PROBLEMS  UNUSUAL RASH Items with * indicate a potential emergency and should be followed up as soon as possible.  Feel free to call the clinic should you have any questions or concerns. The clinic phone number is [phone].  Please show the [facility] at check-in to the Emergency Department and triage nurse.

## 2020-07-02 ENCOUNTER — Inpatient Hospital Stay: Payer: Medicare Other

## 2020-07-02 ENCOUNTER — Telehealth: Payer: Self-pay | Admitting: Physician Assistant

## 2020-07-02 ENCOUNTER — Other Ambulatory Visit: Payer: Self-pay

## 2020-07-02 ENCOUNTER — Other Ambulatory Visit: Payer: Self-pay | Admitting: Physician Assistant

## 2020-07-02 DIAGNOSIS — C3490 Malignant neoplasm of unspecified part of unspecified bronchus or lung: Secondary | ICD-10-CM

## 2020-07-02 DIAGNOSIS — D49512 Neoplasm of unspecified behavior of left kidney: Secondary | ICD-10-CM | POA: Diagnosis not present

## 2020-07-02 DIAGNOSIS — D61818 Other pancytopenia: Secondary | ICD-10-CM | POA: Diagnosis not present

## 2020-07-02 DIAGNOSIS — C3491 Malignant neoplasm of unspecified part of right bronchus or lung: Secondary | ICD-10-CM

## 2020-07-02 DIAGNOSIS — D649 Anemia, unspecified: Secondary | ICD-10-CM

## 2020-07-02 DIAGNOSIS — Z5111 Encounter for antineoplastic chemotherapy: Secondary | ICD-10-CM | POA: Diagnosis not present

## 2020-07-02 DIAGNOSIS — C3411 Malignant neoplasm of upper lobe, right bronchus or lung: Secondary | ICD-10-CM | POA: Diagnosis not present

## 2020-07-02 LAB — CMP (CANCER CENTER ONLY)
ALT: 12 U/L (ref 0–44)
AST: 10 U/L — ABNORMAL LOW (ref 15–41)
Albumin: 3.8 g/dL (ref 3.5–5.0)
Alkaline Phosphatase: 120 U/L (ref 38–126)
Anion gap: 10 (ref 5–15)
BUN: 27 mg/dL — ABNORMAL HIGH (ref 8–23)
CO2: 24 mmol/L (ref 22–32)
Calcium: 9.1 mg/dL (ref 8.9–10.3)
Chloride: 101 mmol/L (ref 98–111)
Creatinine: 1.1 mg/dL (ref 0.61–1.24)
GFR, Estimated: >60 mL/min (ref >60)
Glucose, Bld: 119 mg/dL — ABNORMAL HIGH (ref 70–99)
Potassium: 4.2 mmol/L (ref 3.5–5.1)
Sodium: 135 mmol/L (ref 135–145)
Total Bilirubin: 1 mg/dL (ref 0.3–1.2)
Total Protein: 6.9 g/dL (ref 6.5–8.1)

## 2020-07-02 LAB — CBC WITH DIFFERENTIAL (CANCER CENTER ONLY)
Abs Immature Granulocytes: 0.27 10*3/uL — ABNORMAL HIGH (ref 0.00–0.07)
Basophils Absolute: 0.1 10*3/uL (ref 0.0–0.1)
Basophils Relative: 1 %
Eosinophils Absolute: 0 10*3/uL (ref 0.0–0.5)
Eosinophils Relative: 0 %
HCT: 23.2 % — ABNORMAL LOW (ref 39.0–52.0)
Hemoglobin: 7.5 g/dL — ABNORMAL LOW (ref 13.0–17.0)
Immature Granulocytes: 3 %
Lymphocytes Relative: 4 %
Lymphs Abs: 0.3 10*3/uL — ABNORMAL LOW (ref 0.7–4.0)
MCH: 30.4 pg (ref 26.0–34.0)
MCHC: 32.3 g/dL (ref 30.0–36.0)
MCV: 93.9 fL (ref 80.0–100.0)
Monocytes Absolute: 0.2 10*3/uL (ref 0.1–1.0)
Monocytes Relative: 2 %
Neutro Abs: 7.7 10*3/uL (ref 1.7–7.7)
Neutrophils Relative %: 90 %
Platelet Count: 120 10*3/uL — ABNORMAL LOW (ref 150–400)
RBC: 2.47 MIL/uL — ABNORMAL LOW (ref 4.22–5.81)
RDW: 14.5 % (ref 11.5–15.5)
WBC Count: 8.6 10*3/uL (ref 4.0–10.5)
nRBC: 0 % (ref 0.0–0.2)

## 2020-07-02 LAB — PREPARE RBC (CROSSMATCH)

## 2020-07-02 LAB — SAMPLE TO BLOOD BANK

## 2020-07-02 MED ORDER — SODIUM CHLORIDE 0.9% IV SOLUTION
250.0000 mL | Freq: Once | INTRAVENOUS | Status: AC
  Administered 2020-07-02: 250 mL via INTRAVENOUS
  Filled 2020-07-02: qty 250

## 2020-07-02 MED ORDER — DIPHENHYDRAMINE HCL 25 MG PO CAPS
ORAL_CAPSULE | ORAL | Status: AC
  Filled 2020-07-02: qty 1

## 2020-07-02 MED ORDER — ACETAMINOPHEN 325 MG PO TABS
650.0000 mg | ORAL_TABLET | Freq: Once | ORAL | Status: AC
  Administered 2020-07-02: 650 mg via ORAL

## 2020-07-02 MED ORDER — DIPHENHYDRAMINE HCL 25 MG PO CAPS
25.0000 mg | ORAL_CAPSULE | Freq: Once | ORAL | Status: AC
  Administered 2020-07-02: 25 mg via ORAL

## 2020-07-02 MED ORDER — ACETAMINOPHEN 325 MG PO TABS
ORAL_TABLET | ORAL | Status: AC
  Filled 2020-07-02: qty 2

## 2020-07-02 NOTE — Patient Instructions (Signed)

## 2020-07-02 NOTE — Telephone Encounter (Signed)
I called the patient to let him know we would arrange for 1 unit of blood today. Relayed to him the appointment is scheduled for 12:45. He is aware of the appointment time and will be here then.

## 2020-07-03 LAB — TYPE AND SCREEN
ABO/RH(D): A NEG
Antibody Screen: NEGATIVE
Unit division: 0

## 2020-07-03 LAB — BPAM RBC
Blood Product Expiration Date: 202204182359
ISSUE DATE / TIME: 202203211328
Unit Type and Rh: 600

## 2020-07-09 ENCOUNTER — Other Ambulatory Visit: Payer: Self-pay | Admitting: Medical

## 2020-07-09 ENCOUNTER — Inpatient Hospital Stay: Payer: Medicare Other

## 2020-07-09 ENCOUNTER — Telehealth: Payer: Self-pay | Admitting: Medical Oncology

## 2020-07-09 ENCOUNTER — Ambulatory Visit (HOSPITAL_BASED_OUTPATIENT_CLINIC_OR_DEPARTMENT_OTHER): Payer: Medicare Other | Admitting: Medical

## 2020-07-09 ENCOUNTER — Other Ambulatory Visit: Payer: Self-pay | Admitting: Medical Oncology

## 2020-07-09 ENCOUNTER — Other Ambulatory Visit: Payer: Self-pay

## 2020-07-09 DIAGNOSIS — C3411 Malignant neoplasm of upper lobe, right bronchus or lung: Secondary | ICD-10-CM | POA: Diagnosis not present

## 2020-07-09 DIAGNOSIS — D649 Anemia, unspecified: Secondary | ICD-10-CM

## 2020-07-09 DIAGNOSIS — Z5111 Encounter for antineoplastic chemotherapy: Secondary | ICD-10-CM | POA: Diagnosis not present

## 2020-07-09 DIAGNOSIS — D7212 Drug rash with eosinophilia and systemic symptoms syndrome: Secondary | ICD-10-CM

## 2020-07-09 DIAGNOSIS — C3490 Malignant neoplasm of unspecified part of unspecified bronchus or lung: Secondary | ICD-10-CM

## 2020-07-09 DIAGNOSIS — D61818 Other pancytopenia: Secondary | ICD-10-CM | POA: Diagnosis not present

## 2020-07-09 DIAGNOSIS — C3491 Malignant neoplasm of unspecified part of right bronchus or lung: Secondary | ICD-10-CM

## 2020-07-09 DIAGNOSIS — T7840XA Allergy, unspecified, initial encounter: Secondary | ICD-10-CM

## 2020-07-09 LAB — SAMPLE TO BLOOD BANK

## 2020-07-09 LAB — CMP (CANCER CENTER ONLY)
ALT: 12 U/L (ref 0–44)
AST: 9 U/L — ABNORMAL LOW (ref 15–41)
Albumin: 3.8 g/dL (ref 3.5–5.0)
Alkaline Phosphatase: 90 U/L (ref 38–126)
Anion gap: 11 (ref 5–15)
BUN: 21 mg/dL (ref 8–23)
CO2: 25 mmol/L (ref 22–32)
Calcium: 8.8 mg/dL — ABNORMAL LOW (ref 8.9–10.3)
Chloride: 100 mmol/L (ref 98–111)
Creatinine: 1.11 mg/dL (ref 0.61–1.24)
GFR, Estimated: >60 mL/min (ref >60)
Glucose, Bld: 107 mg/dL — ABNORMAL HIGH (ref 70–99)
Potassium: 4.3 mmol/L (ref 3.5–5.1)
Sodium: 136 mmol/L (ref 135–145)
Total Bilirubin: 0.3 mg/dL (ref 0.3–1.2)
Total Protein: 6.6 g/dL (ref 6.5–8.1)

## 2020-07-09 LAB — PREPARE RBC (CROSSMATCH)

## 2020-07-09 LAB — CBC WITH DIFFERENTIAL (CANCER CENTER ONLY)
Abs Immature Granulocytes: 0.12 10*3/uL — ABNORMAL HIGH (ref 0.00–0.07)
Basophils Absolute: 0 10*3/uL (ref 0.0–0.1)
Basophils Relative: 0 %
Eosinophils Absolute: 0 10*3/uL (ref 0.0–0.5)
Eosinophils Relative: 1 %
HCT: 22.1 % — ABNORMAL LOW (ref 39.0–52.0)
Hemoglobin: 7.4 g/dL — ABNORMAL LOW (ref 13.0–17.0)
Immature Granulocytes: 2 %
Lymphocytes Relative: 10 %
Lymphs Abs: 0.5 10*3/uL — ABNORMAL LOW (ref 0.7–4.0)
MCH: 31 pg (ref 26.0–34.0)
MCHC: 33.5 g/dL (ref 30.0–36.0)
MCV: 92.5 fL (ref 80.0–100.0)
Monocytes Absolute: 0.7 10*3/uL (ref 0.1–1.0)
Monocytes Relative: 13 %
Neutro Abs: 3.8 10*3/uL (ref 1.7–7.7)
Neutrophils Relative %: 74 %
Platelet Count: 13 10*3/uL — ABNORMAL LOW (ref 150–400)
RBC: 2.39 MIL/uL — ABNORMAL LOW (ref 4.22–5.81)
RDW: 14 % (ref 11.5–15.5)
WBC Count: 5.2 10*3/uL (ref 4.0–10.5)
nRBC: 0 % (ref 0.0–0.2)

## 2020-07-09 MED ORDER — METHYLPREDNISOLONE SODIUM SUCC 125 MG IJ SOLR
125.0000 mg | Freq: Once | INTRAMUSCULAR | Status: AC
  Administered 2020-07-09: 125 mg via INTRAVENOUS

## 2020-07-09 MED ORDER — FAMOTIDINE IN NACL 20-0.9 MG/50ML-% IV SOLN
20.0000 mg | Freq: Once | INTRAVENOUS | Status: AC
  Administered 2020-07-09: 20 mg via INTRAVENOUS

## 2020-07-09 MED ORDER — DIPHENHYDRAMINE HCL 50 MG/ML IJ SOLN
25.0000 mg | Freq: Once | INTRAMUSCULAR | Status: AC
  Administered 2020-07-09: 25 mg via INTRAVENOUS

## 2020-07-09 MED ORDER — ACETAMINOPHEN 325 MG PO TABS
650.0000 mg | ORAL_TABLET | Freq: Once | ORAL | Status: AC
  Administered 2020-07-09: 650 mg via ORAL

## 2020-07-09 MED ORDER — SODIUM CHLORIDE 0.9% IV SOLUTION
250.0000 mL | Freq: Once | INTRAVENOUS | Status: AC
  Administered 2020-07-09: 250 mL via INTRAVENOUS
  Filled 2020-07-09: qty 250

## 2020-07-09 MED ORDER — DIPHENHYDRAMINE HCL 25 MG PO CAPS
25.0000 mg | ORAL_CAPSULE | Freq: Once | ORAL | Status: AC
  Administered 2020-07-09: 25 mg via ORAL

## 2020-07-09 MED ORDER — ACETAMINOPHEN 325 MG PO TABS
ORAL_TABLET | ORAL | Status: AC
  Filled 2020-07-09: qty 2

## 2020-07-09 MED ORDER — DIPHENHYDRAMINE HCL 25 MG PO CAPS
ORAL_CAPSULE | ORAL | Status: AC
  Filled 2020-07-09: qty 1

## 2020-07-09 NOTE — Patient Instructions (Signed)
Thrombocytopenia Thrombocytopenia means that you have a low number of platelets in your blood. Platelets are tiny cells in the blood. When you bleed, they clump together at the cut or injury to stop the bleeding. This is called blood clotting. If you do not have enough platelets, it can cause bleeding problems. Some cases of this condition are mild while others are more severe. What are the causes? This condition may be caused by:  Your body not making enough platelets. This may be caused by: ? Your bone marrow not making blood cells (aplastic anemia). ? Cancer in the bone marrow. ? Certain medicines. ? Infection in the bone marrow. ? Drinking a lot of alcohol.  Your body destroying platelets too quickly. This may be caused by: ? Certain immune diseases. ? Certain medicines. ? Certain blood clotting disorders. ? Certain disorders that are passed from parent to child (inherited). ? Certain bleeding disorders. ? Pregnancy. ? Having a spleen that is larger than normal. What are the signs or symptoms?  Bleeding that is not normal.  Nosebleeds.  Heavy menstrual periods.  Blood in the pee (urine) or poop (stool).  A purple-like color to the skin (purpura).  Bruising.  A rash that looks like pinpoint, purple-red spots (petechiae). How is this treated?  Treatment of another condition that is causing the low platelet count.  Medicines to help protect your platelets from being destroyed.  A replacement (transfusion) of platelets to stop or prevent bleeding.  Surgery to remove the spleen. Follow these instructions at home: Activity  Avoid activities that could cause you to get hurt or bruised. Follow instructions about how to prevent falls.  Take care not to cut yourself: ? When you shave. ? When you use scissors, needles, knives, or other tools.  Take care not to burn yourself: ? When you use an iron. ? When you cook. General instructions  Check your skin and the  inside of your mouth for bruises or blood as told by your doctor.  Check to see if there is blood in your spit (sputum), pee, and poop. Do this as told by your doctor.  Do not drink alcohol.  Take over-the-counter and prescription medicines only as told by your doctor.  Do not take any medicines that have aspirin or NSAIDs in them. These medicines can thin your blood and cause you to bleed.  Tell all of your doctors that you have this condition. Be sure to tell your dentist and eye doctor too.   Contact a doctor if:  You have bruises and you do not know why. Get help right away if:  You are bleeding anywhere on your body.  You have blood in your spit, pee, or poop. Summary  Thrombocytopenia means that you have a low number of platelets in your blood.  Platelets are needed for blood clotting.  Symptoms of this condition include bleeding that is not normal, and bruising.  Take care not to cut or burn yourself. This information is not intended to replace advice given to you by your health care provider. Make sure you discuss any questions you have with your health care provider. Document Revised: 12/31/2017 Document Reviewed: 12/31/2017 Elsevier Patient Education  [place].

## 2020-07-09 NOTE — Addendum Note (Signed)
Addended by: Ardeen Garland on: 07/09/2020 09:38 AM   Modules accepted: Orders

## 2020-07-09 NOTE — Telephone Encounter (Signed)
Pt told he needs platelets and blood today.

## 2020-07-09 NOTE — Progress Notes (Signed)
Approximately 5 minutes into pRBC transfusion, patient c/o itching. Transfusion paused. Red rash noted on BIL arms and chest. Sandi Mealy, PA, notified, came to chairside. NS wide open, 125mg  solumedrol, and 25mg  benadryl administered per Sandi Mealy, PA. After 20 minutes, minimal improvement noted. 20mg  pepcid administered per Sandi Mealy, PA. Patient stated improvement of symptoms. Blood transfusion not restarted. Blood bank notified. Patient to return 3/29 for 1 unit of pRBCs. Patient instructed to call if further issues arise.

## 2020-07-10 ENCOUNTER — Inpatient Hospital Stay: Payer: Medicare Other

## 2020-07-10 DIAGNOSIS — Z5111 Encounter for antineoplastic chemotherapy: Secondary | ICD-10-CM | POA: Diagnosis not present

## 2020-07-10 DIAGNOSIS — D649 Anemia, unspecified: Secondary | ICD-10-CM

## 2020-07-10 DIAGNOSIS — C3411 Malignant neoplasm of upper lobe, right bronchus or lung: Secondary | ICD-10-CM | POA: Diagnosis not present

## 2020-07-10 DIAGNOSIS — D61818 Other pancytopenia: Secondary | ICD-10-CM | POA: Diagnosis not present

## 2020-07-10 LAB — BPAM PLATELET PHERESIS
Blood Product Expiration Date: 202203302359
ISSUE DATE / TIME: 202203281206
Unit Type and Rh: 6200

## 2020-07-10 LAB — PREPARE PLATELET PHERESIS: Unit division: 0

## 2020-07-10 MED ORDER — DIPHENHYDRAMINE HCL 25 MG PO CAPS
ORAL_CAPSULE | ORAL | Status: AC
  Filled 2020-07-10: qty 1

## 2020-07-10 MED ORDER — ACETAMINOPHEN 325 MG PO TABS
650.0000 mg | ORAL_TABLET | Freq: Once | ORAL | Status: AC
  Administered 2020-07-10: 650 mg via ORAL

## 2020-07-10 MED ORDER — SODIUM CHLORIDE 0.9% IV SOLUTION
250.0000 mL | Freq: Once | INTRAVENOUS | Status: AC
  Administered 2020-07-10: 250 mL via INTRAVENOUS
  Filled 2020-07-10: qty 250

## 2020-07-10 MED ORDER — DIPHENHYDRAMINE HCL 25 MG PO CAPS
25.0000 mg | ORAL_CAPSULE | Freq: Once | ORAL | Status: AC
  Administered 2020-07-10: 25 mg via ORAL

## 2020-07-10 MED ORDER — ACETAMINOPHEN 325 MG PO TABS
ORAL_TABLET | ORAL | Status: AC
  Filled 2020-07-10: qty 2

## 2020-07-10 NOTE — Patient Instructions (Signed)

## 2020-07-11 LAB — BPAM RBC
Blood Product Expiration Date: 202204222359
Blood Product Expiration Date: 202204282359
ISSUE DATE / TIME: 202203281203
ISSUE DATE / TIME: 202203291004
Unit Type and Rh: 600
Unit Type and Rh: 600

## 2020-07-11 LAB — TYPE AND SCREEN
ABO/RH(D): A NEG
Antibody Screen: NEGATIVE
Unit division: 0
Unit division: 0

## 2020-07-12 NOTE — Progress Notes (Signed)
Mr. Baruch was seen while he was receiving a transfusion of packed red blood cells.  He had received a transfusion of platelets without any issue of concern.  He had completed approximately one third of his transfusion of packed red blood cells when he developed a widespread pruritic erythematous rash.  He was given Solu-Medrol 125 mg IV followed by Benadryl 25 mg IV.  These were without benefit.  He was next given Pepcid 20 mg IV x1 with resolution of his symptoms.  He did not receive the remainder of his transfusion.  He was released home and will return tomorrow for a transfusion of 1 unit of packed red blood cells.  His vital signs were stable.  He was without any other issues or concerns such as shakes, chills, chest pain or shortness of breath.  This case was discussed with Dr. Julien Nordmann. He expressed agreement with my management of this patient.  Sandi Mealy, MHS, PA-C Physician Assistant

## 2020-07-16 ENCOUNTER — Ambulatory Visit: Payer: Medicare Other

## 2020-07-16 ENCOUNTER — Inpatient Hospital Stay: Payer: Medicare Other

## 2020-07-16 ENCOUNTER — Other Ambulatory Visit: Payer: Self-pay | Admitting: Medical Oncology

## 2020-07-16 ENCOUNTER — Other Ambulatory Visit: Payer: Self-pay

## 2020-07-16 ENCOUNTER — Ambulatory Visit: Payer: Medicare Other | Admitting: Internal Medicine

## 2020-07-16 ENCOUNTER — Inpatient Hospital Stay: Payer: Medicare Other | Attending: Internal Medicine | Admitting: Nutrition

## 2020-07-16 ENCOUNTER — Other Ambulatory Visit: Payer: Medicare Other

## 2020-07-16 DIAGNOSIS — D649 Anemia, unspecified: Secondary | ICD-10-CM

## 2020-07-16 DIAGNOSIS — C779 Secondary and unspecified malignant neoplasm of lymph node, unspecified: Secondary | ICD-10-CM | POA: Diagnosis not present

## 2020-07-16 DIAGNOSIS — C3491 Malignant neoplasm of unspecified part of right bronchus or lung: Secondary | ICD-10-CM

## 2020-07-16 DIAGNOSIS — C3411 Malignant neoplasm of upper lobe, right bronchus or lung: Secondary | ICD-10-CM | POA: Diagnosis not present

## 2020-07-16 LAB — SAMPLE TO BLOOD BANK

## 2020-07-16 LAB — CBC WITH DIFFERENTIAL (CANCER CENTER ONLY)
Abs Immature Granulocytes: 0.16 10*3/uL — ABNORMAL HIGH (ref 0.00–0.07)
Basophils Absolute: 0.1 10*3/uL (ref 0.0–0.1)
Basophils Relative: 1 %
Eosinophils Absolute: 0 10*3/uL (ref 0.0–0.5)
Eosinophils Relative: 0 %
HCT: 24.3 % — ABNORMAL LOW (ref 39.0–52.0)
Hemoglobin: 8 g/dL — ABNORMAL LOW (ref 13.0–17.0)
Immature Granulocytes: 2 %
Lymphocytes Relative: 5 %
Lymphs Abs: 0.5 10*3/uL — ABNORMAL LOW (ref 0.7–4.0)
MCH: 30.8 pg (ref 26.0–34.0)
MCHC: 32.9 g/dL (ref 30.0–36.0)
MCV: 93.5 fL (ref 80.0–100.0)
Monocytes Absolute: 0.9 10*3/uL (ref 0.1–1.0)
Monocytes Relative: 10 %
Neutro Abs: 7.2 10*3/uL (ref 1.7–7.7)
Neutrophils Relative %: 82 %
Platelet Count: 160 10*3/uL (ref 150–400)
RBC: 2.6 MIL/uL — ABNORMAL LOW (ref 4.22–5.81)
RDW: 16.1 % — ABNORMAL HIGH (ref 11.5–15.5)
WBC Count: 8.8 10*3/uL (ref 4.0–10.5)
nRBC: 0 % (ref 0.0–0.2)

## 2020-07-16 NOTE — Progress Notes (Signed)
Nutrition follow-up completed with patient and wife after lab work.  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  Weight is stable overall and 129.4 pounds today.  Wife reports patient has increased oral intake for the past week.  He is consuming cold cereal at breakfast, cheeseburger with [place] fries or lasagna at dinnertime, and ice cream with oatmeal raisin cookies at bedtime.  Uses Carnation breakfast essentials with protein powder and ice cream once daily.   He denies pain and reports he is currently swallowing within normal limits.  Denies nausea, vomiting, constipation, and diarrhea. He is striving for weight gain.  Nutrition diagnosis: Food and nutrition related knowledge deficit improved.  Intervention: Increase Carnation breakfast essentials twice daily and mix with whole milk ice cream and protein powder. Add high-calorie snack at noon.  Provided nutrition fact sheet. Weigh once weekly on home scale.  Expect slow weight gain or weight maintenance. Contact RD with questions or concerns.  Monitoring, evaluation, goals: Patient will continue increased oral intake to promote weight stabilization/weight gain.  Follow-up to be scheduled per patient request.  Please consult RD in nutrition issues identified.  **Disclaimer: This note was dictated with voice recognition software. Similar sounding words can inadvertently be transcribed and this note may contain transcription errors which may not have been corrected upon publication of note.**

## 2020-07-17 ENCOUNTER — Ambulatory Visit: Payer: Medicare Other

## 2020-07-18 ENCOUNTER — Ambulatory Visit: Payer: Medicare Other

## 2020-07-18 ENCOUNTER — Telehealth: Payer: Self-pay | Admitting: Internal Medicine

## 2020-07-18 NOTE — Telephone Encounter (Signed)
Scheduled appt per 4/4 sch msg. Pt aware.

## 2020-07-20 ENCOUNTER — Other Ambulatory Visit: Payer: Self-pay | Admitting: Physician Assistant

## 2020-07-20 DIAGNOSIS — C3491 Malignant neoplasm of unspecified part of right bronchus or lung: Secondary | ICD-10-CM

## 2020-07-23 ENCOUNTER — Inpatient Hospital Stay: Payer: Medicare Other

## 2020-07-23 ENCOUNTER — Telehealth: Payer: Self-pay | Admitting: Physician Assistant

## 2020-07-23 ENCOUNTER — Other Ambulatory Visit: Payer: Self-pay

## 2020-07-23 DIAGNOSIS — C3411 Malignant neoplasm of upper lobe, right bronchus or lung: Secondary | ICD-10-CM | POA: Diagnosis not present

## 2020-07-23 DIAGNOSIS — C779 Secondary and unspecified malignant neoplasm of lymph node, unspecified: Secondary | ICD-10-CM | POA: Diagnosis not present

## 2020-07-23 DIAGNOSIS — C3491 Malignant neoplasm of unspecified part of right bronchus or lung: Secondary | ICD-10-CM

## 2020-07-23 LAB — CMP (CANCER CENTER ONLY)
ALT: 11 U/L (ref 0–44)
AST: 11 U/L — ABNORMAL LOW (ref 15–41)
Albumin: 3.8 g/dL (ref 3.5–5.0)
Alkaline Phosphatase: 78 U/L (ref 38–126)
Anion gap: 10 (ref 5–15)
BUN: 24 mg/dL — ABNORMAL HIGH (ref 8–23)
CO2: 23 mmol/L (ref 22–32)
Calcium: 9.1 mg/dL (ref 8.9–10.3)
Chloride: 102 mmol/L (ref 98–111)
Creatinine: 1.18 mg/dL (ref 0.61–1.24)
GFR, Estimated: >60 mL/min (ref >60)
Glucose, Bld: 105 mg/dL — ABNORMAL HIGH (ref 70–99)
Potassium: 4.9 mmol/L (ref 3.5–5.1)
Sodium: 135 mmol/L (ref 135–145)
Total Bilirubin: 0.3 mg/dL (ref 0.3–1.2)
Total Protein: 6.5 g/dL (ref 6.5–8.1)

## 2020-07-23 LAB — CBC WITH DIFFERENTIAL (CANCER CENTER ONLY)
Abs Immature Granulocytes: 0.05 10*3/uL (ref 0.00–0.07)
Basophils Absolute: 0 10*3/uL (ref 0.0–0.1)
Basophils Relative: 0 %
Eosinophils Absolute: 0.1 10*3/uL (ref 0.0–0.5)
Eosinophils Relative: 1 %
HCT: 25 % — ABNORMAL LOW (ref 39.0–52.0)
Hemoglobin: 8.2 g/dL — ABNORMAL LOW (ref 13.0–17.0)
Immature Granulocytes: 1 %
Lymphocytes Relative: 7 %
Lymphs Abs: 0.5 10*3/uL — ABNORMAL LOW (ref 0.7–4.0)
MCH: 31.8 pg (ref 26.0–34.0)
MCHC: 32.8 g/dL (ref 30.0–36.0)
MCV: 96.9 fL (ref 80.0–100.0)
Monocytes Absolute: 0.8 10*3/uL (ref 0.1–1.0)
Monocytes Relative: 11 %
Neutro Abs: 6.3 10*3/uL (ref 1.7–7.7)
Neutrophils Relative %: 80 %
Platelet Count: 242 10*3/uL (ref 150–400)
RBC: 2.58 MIL/uL — ABNORMAL LOW (ref 4.22–5.81)
RDW: 18.2 % — ABNORMAL HIGH (ref 11.5–15.5)
WBC Count: 7.8 10*3/uL (ref 4.0–10.5)
nRBC: 0 % (ref 0.0–0.2)

## 2020-07-23 LAB — SAMPLE TO BLOOD BANK

## 2020-07-23 NOTE — Telephone Encounter (Signed)
I was reviewing the patient's labs today when I noticed that his restaging CT scan has not been scheduled. He has a dedicated visit with Dr. Julien Nordmann on 4/13 to review the scan results. I called him and instructed him to call radiology scheduling to schedule at the earliest availability, preferably tomorrow. Therefore, then we would not need to reschedule the follow up with Dr. Julien Nordmann on 4/13. I will keep on eye on when his scan gets scheduled and may need to change the follow up with Dr. Julien Nordmann if he is not able to get the scan done before his next visit.

## 2020-07-24 ENCOUNTER — Ambulatory Visit (HOSPITAL_BASED_OUTPATIENT_CLINIC_OR_DEPARTMENT_OTHER)
Admission: RE | Admit: 2020-07-24 | Discharge: 2020-07-24 | Disposition: A | Discharge status: Home / Self Care | Payer: Medicare Other | Source: Ambulatory Visit | Attending: Physician Assistant | Admitting: Physician Assistant

## 2020-07-24 DIAGNOSIS — C3490 Malignant neoplasm of unspecified part of unspecified bronchus or lung: Secondary | ICD-10-CM | POA: Diagnosis not present

## 2020-07-24 DIAGNOSIS — C3411 Malignant neoplasm of upper lobe, right bronchus or lung: Secondary | ICD-10-CM | POA: Diagnosis not present

## 2020-07-24 DIAGNOSIS — J432 Centrilobular emphysema: Secondary | ICD-10-CM | POA: Diagnosis not present

## 2020-07-24 DIAGNOSIS — I2510 Atherosclerotic heart disease of native coronary artery without angina pectoris: Secondary | ICD-10-CM | POA: Diagnosis not present

## 2020-07-24 DIAGNOSIS — C3491 Malignant neoplasm of unspecified part of right bronchus or lung: Secondary | ICD-10-CM | POA: Diagnosis not present

## 2020-07-25 ENCOUNTER — Inpatient Hospital Stay: Payer: Medicare Other | Admitting: Internal Medicine

## 2020-07-25 ENCOUNTER — Encounter: Payer: Self-pay | Admitting: Internal Medicine

## 2020-07-25 ENCOUNTER — Other Ambulatory Visit: Payer: Self-pay

## 2020-07-25 VITALS — BP 107/53 | HR 91 | Temp 97.0°F | Resp 19 | Ht 73.0 in | Wt 131.3 lb

## 2020-07-25 DIAGNOSIS — C3490 Malignant neoplasm of unspecified part of unspecified bronchus or lung: Secondary | ICD-10-CM

## 2020-07-25 DIAGNOSIS — C3411 Malignant neoplasm of upper lobe, right bronchus or lung: Secondary | ICD-10-CM | POA: Diagnosis not present

## 2020-07-25 DIAGNOSIS — C779 Secondary and unspecified malignant neoplasm of lymph node, unspecified: Secondary | ICD-10-CM | POA: Diagnosis not present

## 2020-07-25 DIAGNOSIS — C3491 Malignant neoplasm of unspecified part of right bronchus or lung: Secondary | ICD-10-CM

## 2020-07-25 NOTE — Progress Notes (Signed)
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 3) Suspicious left renal lesion  PRIOR THERAPY: Systemic chemotherapy/radiation with cisplatin 80 mg per metered squaredon day 1and etoposide 100 mg per metered squared on days 1, 2, and 3 IV every 3 weeks.Status post4cycles. Starting from cycle #3, cisplatin was changed to carboplatin for an AUC of 5 due to renal insufficiency. First dose on 04/20/20.Onprowas added to the treatment plan starting from cycle #3 due to pancytopenia.    CURRENT THERAPY:  Observation  INTERVAL HISTORY: Ian Hart 70 y.o. male returns to the clinic today for follow-up visit accompanied by his wife.  The patient is feeling fine today with no concerning complaints except for fatigue.  He is recovering from his recent adverse effect of the chemotherapy fairly well.  He started gaining weight.  He denied having any current chest pain, shortness of breath, cough or hemoptysis.  He denied having any fever or chills.  He has no nausea, vomiting, diarrhea or constipation.  He has no headache or visual changes.  He was seen by urology for the suspicious left renal lesion and they recommended left nephrectomy but the patient is interested in second opinion.  He had repeat CT scan of the chest performed yesterday and he is here for evaluation and discussion of his scan results and treatment options.  MEDICAL HISTORY: Past Medical History:  Diagnosis Date  . History of chicken pox   . History of shingles 04/2010  . Lung cancer ([place]) 02/2020    ALLERGIES:  is allergic to acyclovir and related.  MEDICATIONS:  Current Outpatient Medications   Medication Sig Dispense Refill  . ascorbic acid (VITAMIN C) 500 MG tablet Take 500 mg by mouth daily.    Marland Kitchen buPROPion (WELLBUTRIN XL) 300 MG 24 hr tablet Take 1 tablet (300 mg total) by mouth daily. 90 tablet 0  . HYDROcodone-acetaminophen (HYCET) 7.5-325 mg/15 ml solution Take 10 mL ever 3-4 hours prn pain 473 mL 0  . Multiple Vitamin (MULTIVITAMIN) tablet Take 1 tablet by mouth daily.    . nicotine (NICODERM CQ - DOSED IN MG/24 HOURS) 14 mg/24hr patch Place 1 patch (14 mg total) onto the skin daily. 30 patch 1  . omeprazole (PRILOSEC) 20 MG capsule Take 1 capsule (20 mg total) by mouth daily. 30 capsule 1  . prochlorperazine (COMPAZINE) 10 MG tablet Take 1 tablet (10 mg total) by mouth every 6 (six) hours as needed. 30 tablet 2  . sucralfate (CARAFATE) 1 g tablet Take 1 tablet (1 g total) by mouth 4 (four) times daily. Dissolve each tablet in 15 cc water before use. 120 tablet 2  . umeclidinium-vilanterol (ANORO ELLIPTA) 62.5-25 MCG/INH AEPB Inhale 1 puff into the lungs daily. 3 each 2  . vitamin B-12 (CYANOCOBALAMIN) 1000 MCG tablet Take 1,000 mcg by mouth daily.     No current facility-administered medications for this visit.    SURGICAL HISTORY:  Past Surgical History:  Procedure Laterality Date  . BRONCHIAL BIOPSY  03/22/2020   Procedure: BRONCHIAL BIOPSIES;  Surgeon: Garner Nash, DO;  Location: [facility];  Service: Pulmonary;;  . BRONCHIAL BRUSHINGS  03/22/2020   Procedure: BRONCHIAL BRUSHINGS;  Surgeon: Garner Nash, DO;  Location: [facility];  Service: Pulmonary;;  . BRONCHIAL NEEDLE ASPIRATION BIOPSY  03/22/2020   Procedure: BRONCHIAL NEEDLE ASPIRATION BIOPSIES;  Surgeon: Garner Nash, DO;  Location: [facility];  Service: Pulmonary;;  . BRONCHIAL WASHINGS  03/22/2020   Procedure: BRONCHIAL WASHINGS;  Surgeon: Garner Nash, DO;  Location: [facility];  Service: Pulmonary;;  . FIDUCIAL MARKER PLACEMENT  03/22/2020   Procedure: FIDUCIAL MARKER PLACEMENT;   Surgeon: Garner Nash, DO;  Location: [facility];  Service: Pulmonary;;  . NO PAST SURGERIES    . VIDEO BRONCHOSCOPY WITH ENDOBRONCHIAL NAVIGATION N/A 03/22/2020   Procedure: VIDEO BRONCHOSCOPY WITH ENDOBRONCHIAL NAVIGATION;  Surgeon: Garner Nash, DO;  Location: [facility];  Service: Pulmonary;  Laterality: N/A;  . VIDEO BRONCHOSCOPY WITH ENDOBRONCHIAL ULTRASOUND N/A 03/22/2020   Procedure: VIDEO BRONCHOSCOPY WITH ENDOBRONCHIAL ULTRASOUND;  Surgeon: Garner Nash, DO;  Location: [facility];  Service: Pulmonary;  Laterality: N/A;    REVIEW OF SYSTEMS:  Constitutional: positive for fatigue and weight loss Eyes: negative Ears, nose, mouth, throat, and face: negative Respiratory: negative Cardiovascular: negative Gastrointestinal: negative Genitourinary:negative Integument/breast: negative Hematologic/lymphatic: negative Musculoskeletal:negative Neurological: negative Behavioral/Psych: negative Endocrine: negative Allergic/Immunologic: negative   PHYSICAL EXAMINATION: General appearance: alert, cooperative, fatigued and no distress Head: Normocephalic, without obvious abnormality, atraumatic Neck: no adenopathy, no JVD, supple, symmetrical, trachea midline and thyroid not enlarged, symmetric, no tenderness/mass/nodules Lymph nodes: Cervical, supraclavicular, and axillary nodes normal. Resp: clear to auscultation bilaterally Back: symmetric, no curvature. ROM normal. No CVA tenderness. Cardio: regular rate and rhythm, S1, S2 normal, no murmur, click, rub or gallop GI: soft, non-tender; bowel sounds normal; no masses,  no organomegaly Extremities: extremities normal, atraumatic, no cyanosis or edema Neurologic: Alert and oriented X 3, normal strength and tone. Normal symmetric reflexes. Normal coordination and gait  ECOG PERFORMANCE STATUS: 1 - Symptomatic but completely ambulatory  Blood pressure (!) 107/53, pulse 91, temperature (!) 97 F (36.1 C), temperature source  Tympanic, resp. rate 19, height 6\' 1"  (1.854 m), weight 131 lb 4.8 oz (59.6 kg), SpO2 100 %.  LABORATORY DATA: Lab Results  Component Value Date   WBC 7.8 07/23/2020   HGB 8.2 (L) 07/23/2020   HCT 25.0 (L) 07/23/2020   MCV 96.9 07/23/2020   PLT 242 07/23/2020      Chemistry      Component Value Date/Time   NA 135 07/23/2020 0841   K 4.9 07/23/2020 0841   CL 102 07/23/2020 0841   CO2 23 07/23/2020 0841   BUN 24 (H) 07/23/2020 0841   CREATININE 1.18 07/23/2020 0841   CREATININE 0.86 11/12/2011 0917      Component Value Date/Time   CALCIUM 9.1 07/23/2020 0841   ALKPHOS 78 07/23/2020 0841   AST 11 (L) 07/23/2020 0841   ALT 11 07/23/2020 0841   BILITOT 0.3 07/23/2020 0841       RADIOGRAPHIC STUDIES: No results found.  ASSESSMENT AND PLAN: This is a very pleasant 70 years old white male diagnosed with poorly differentiated carcinoma consistent with adenocarcinoma in the right upper lobe.  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  The patient also has a suspicious left renal lesion concerning for renal cell carcinoma. He is status post a course of systemic chemotherapy initially with cisplatin and etoposide but the cisplatin was discontinued secondary to renal insufficiency and the patient continued 3 more cycles of his systemic chemotherapy with carboplatin and etoposide concurrent with radiation.  He has a rough  time with the treatment including pancytopenia as well as fatigue and weight loss. He is feeling much better now.  He had repeat CT scan of the chest performed recently.  I personally and independently reviewed the scan images and discussed the results with the patient and his wife. The images showed improvement of his disease but the final report is still pending. I recommended for the patient to see Dr. Lisbeth Renshaw for consideration of prophylactic cranial irradiation. If the final report showed any concerning findings I will  call the patient for reevaluation and further recommendation regarding his condition. Otherwise I will see him back for follow-up visit in 3 months with repeat CT scan of the chest without contrast. For the left renal lesion, the patient was not happy with the recommendation from the urologist regarding the left nephrectomy and he will seek a second opinion. The patient was advised to call immediately if he has any other concerning symptoms in the interval. The patient voices understanding of current disease status and treatment options and is in agreement with the current care plan.  All questions were answered. The patient knows to call the clinic with any problems, questions or concerns. We can certainly see the patient much sooner if necessary.   Disclaimer: This note was dictated with voice recognition software. Similar sounding words can inadvertently be transcribed and may not be corrected upon review.

## 2020-07-26 ENCOUNTER — Telehealth: Payer: Self-pay | Admitting: Internal Medicine

## 2020-07-26 NOTE — Telephone Encounter (Signed)
Scheduled per los. Called and spoke with patient. Confirmed appt 

## 2020-08-02 ENCOUNTER — Other Ambulatory Visit: Payer: Self-pay

## 2020-08-02 ENCOUNTER — Ambulatory Visit
Admission: RE | Admit: 2020-08-02 | Discharge: 2020-08-02 | Disposition: A | Discharge status: Home / Self Care | Payer: Medicare Other | Source: Ambulatory Visit | Attending: Radiation Oncology | Admitting: Radiation Oncology

## 2020-08-02 ENCOUNTER — Encounter: Payer: Self-pay | Admitting: Radiation Oncology

## 2020-08-02 VITALS — BP 111/55 | HR 75 | Temp 97.5°F | Resp 18 | Ht 73.0 in | Wt 133.0 lb

## 2020-08-02 DIAGNOSIS — C3411 Malignant neoplasm of upper lobe, right bronchus or lung: Secondary | ICD-10-CM | POA: Diagnosis not present

## 2020-08-02 DIAGNOSIS — J432 Centrilobular emphysema: Secondary | ICD-10-CM | POA: Insufficient documentation

## 2020-08-02 DIAGNOSIS — F17210 Nicotine dependence, cigarettes, uncomplicated: Secondary | ICD-10-CM | POA: Insufficient documentation

## 2020-08-02 DIAGNOSIS — Z923 Personal history of irradiation: Secondary | ICD-10-CM | POA: Insufficient documentation

## 2020-08-02 DIAGNOSIS — C3491 Malignant neoplasm of unspecified part of right bronchus or lung: Secondary | ICD-10-CM

## 2020-08-02 DIAGNOSIS — Z298 Encounter for other specified prophylactic measures: Secondary | ICD-10-CM | POA: Diagnosis not present

## 2020-08-02 DIAGNOSIS — C3490 Malignant neoplasm of unspecified part of unspecified bronchus or lung: Secondary | ICD-10-CM

## 2020-08-02 DIAGNOSIS — Z9221 Personal history of antineoplastic chemotherapy: Secondary | ICD-10-CM | POA: Diagnosis not present

## 2020-08-02 DIAGNOSIS — Z79899 Other long term (current) drug therapy: Secondary | ICD-10-CM | POA: Diagnosis not present

## 2020-08-02 DIAGNOSIS — N289 Disorder of kidney and ureter, unspecified: Secondary | ICD-10-CM | POA: Insufficient documentation

## 2020-08-02 DIAGNOSIS — I700 Atherosclerosis of aorta: Secondary | ICD-10-CM | POA: Insufficient documentation

## 2020-08-02 MED ORDER — MEMANTINE HCL 10 MG PO TABS: 10.0000 mg | ORAL_TABLET | Freq: Two times a day (BID) | ORAL | 4 refills | Status: DC

## 2020-08-02 MED ORDER — MEMANTINE HCL 5 MG PO TABS: ORAL_TABLET | ORAL | 0 refills | Status: DC

## 2020-08-02 NOTE — Progress Notes (Signed)
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   HISTORY OF PRESENT ILLNESS: Ian Hart is a 70 y.o. male with a diagnosis of lung cancer.  The patient was referred in November 2021 to Dr. Valeta Harms in pulmonary medicine for an abnormality on screening CT scan.  The patient had the scan on 02/15/2020 revealing a spiculated 13.6 mm nodule in the right upper lobe, and he subsequently underwent a PET scan on 03/01/2020 which revealed hypermetabolism within the right upper lobe nodule as well as an isolated hypermetabolic subcarinal lymph nodes suspicious for nodal disease.  No other extrathoracic hypermetabolic metastases were identified, a left renal lesion which was indeterminate but hypermetabolic was also seen and possibly suspicious for renal cell carcinoma.  He underwent a CT super D scan on 03/22/2020 and subsequent bronchoscopy.  Interestingly bronchoscopy confirms adenocarcinoma with in the lung nodule and small cell carcinoma within the 7 station lymph node.  He proceeded with chemoRT between 04/17/20 and 06/01/20. He has been followed and recent CT imaging showed stability in his treated disease and he will continue in surveillance. He's seen today to discuss proceeding with prophylactic cranial irradiation to reduce risks of brain disease.   PREVIOUS RADIATION THERAPY:     Radiation Treatment Dates: 04/17/2020 through 06/01/2020 Site Technique Total Dose (Gy) Dose per Fx (Gy) Completed Fx Beam Energies  Lung, Right: Lung_Rt 3D 60/60 2 30/30 6X, 10X  Lung, Right: Lung_Rt_Bst 3D 6/6 2 3/3 6X, 10X     PAST MEDICAL HISTORY:  Past Medical History:  Diagnosis Date  . History of chicken  pox   . History of shingles 04/2010  . Lung cancer ([place]) 02/2020       PAST SURGICAL HISTORY: Past Surgical History:  Procedure Laterality Date  . BRONCHIAL BIOPSY  03/22/2020   Procedure: BRONCHIAL BIOPSIES;  Surgeon: Garner Nash, DO;  Location: [facility];  Service: Pulmonary;;  . BRONCHIAL BRUSHINGS  03/22/2020   Procedure: BRONCHIAL BRUSHINGS;  Surgeon: Garner Nash, DO;  Location: [facility];  Service: Pulmonary;;  . BRONCHIAL NEEDLE ASPIRATION BIOPSY  03/22/2020   Procedure: BRONCHIAL NEEDLE ASPIRATION BIOPSIES;  Surgeon: Garner Nash, DO;  Location: [facility];  Service: Pulmonary;;  . BRONCHIAL WASHINGS  03/22/2020   Procedure: BRONCHIAL WASHINGS;  Surgeon: Garner Nash, DO;  Location: [facility];  Service: Pulmonary;;  . FIDUCIAL MARKER PLACEMENT  03/22/2020   Procedure: FIDUCIAL MARKER PLACEMENT;  Surgeon: Garner Nash, DO;  Location: [facility];  Service: Pulmonary;;  . NO PAST SURGERIES    . VIDEO BRONCHOSCOPY WITH ENDOBRONCHIAL NAVIGATION N/A 03/22/2020   Procedure: VIDEO BRONCHOSCOPY WITH ENDOBRONCHIAL NAVIGATION;  Surgeon: Garner Nash, DO;  Location: [employer] [facility];  Service: Pulmonary;  Laterality: N/A;  . VIDEO BRONCHOSCOPY WITH ENDOBRONCHIAL ULTRASOUND N/A 03/22/2020   Procedure: VIDEO BRONCHOSCOPY WITH ENDOBRONCHIAL ULTRASOUND;  Surgeon: Garner Nash, DO;  Location: [facility];  Service: Pulmonary;  Laterality: N/A;     FAMILY HISTORY:  Family History  Problem Relation Age of Onset  . Sudden death Mother   . Rheumatic fever Mother   . Hypertension Neg Hx   . Hyperlipidemia Neg Hx   .  Heart attack Neg Hx   . Diabetes Neg Hx   . Colon cancer Neg Hx   . Stomach cancer Neg Hx      SOCIAL HISTORY:  reports that he has been smoking cigarettes. He has a 100.00 pack-year smoking history. He has never used smokeless tobacco. He reports current alcohol use of about 5.0 standard drinks of alcohol per week. He reports that he does not  use drugs. The patient is married and lives in [place].    ALLERGIES: Acyclovir and related   MEDICATIONS:  Current Outpatient Medications  Medication Sig Dispense Refill  . ascorbic acid (VITAMIN C) 500 MG tablet Take 500 mg by mouth daily.    Marland Kitchen buPROPion (WELLBUTRIN XL) 300 MG 24 hr tablet Take 1 tablet (300 mg total) by mouth daily. 90 tablet 0  . HYDROcodone-acetaminophen (HYCET) 7.5-325 mg/15 ml solution Take 10 mL ever 3-4 hours prn pain 473 mL 0  . Multiple Vitamin (MULTIVITAMIN) tablet Take 1 tablet by mouth daily.    . nicotine (NICODERM CQ - DOSED IN MG/24 HOURS) 14 mg/24hr patch Place 1 patch (14 mg total) onto the skin daily. 30 patch 1  . omeprazole (PRILOSEC) 20 MG capsule Take 1 capsule (20 mg total) by mouth daily. 30 capsule 1  . prochlorperazine (COMPAZINE) 10 MG tablet Take 1 tablet (10 mg total) by mouth every 6 (six) hours as needed. 30 tablet 2  . sucralfate (CARAFATE) 1 g tablet Take 1 tablet (1 g total) by mouth 4 (four) times daily. Dissolve each tablet in 15 cc water before use. 120 tablet 2  . umeclidinium-vilanterol (ANORO ELLIPTA) 62.5-25 MCG/INH AEPB Inhale 1 puff into the lungs daily. 3 each 2  . vitamin B-12 (CYANOCOBALAMIN) 1000 MCG tablet Take 1,000 mcg by mouth daily.     No current facility-administered medications for this encounter.     REVIEW OF SYSTEMS: On review of systems, the patient reports that he is doing well overall. His breathing is stable. He reports his esophagitis from radiation has improved, and he's now been able to gain a few pounds in the last week or so. He denies any headaches at this time. No other complaints are verbalized.     PHYSICAL EXAM:  Wt Readings from Last 3 Encounters:  08/02/20 133 lb (60.3 kg)  07/25/20 131 lb 4.8 oz (59.6 kg)  07/10/20 132 lb 12.8 oz (60.2 kg)   Temp Readings from Last 3 Encounters:  08/02/20 (!) 97.5 F (36.4 C)  07/25/20 (!) 97 F (36.1 C) (Tympanic)  07/10/20 98.2 F (36.8 C) (Oral)    BP Readings from Last 3 Encounters:  08/02/20 (!) 111/55  07/25/20 (!) 107/53  07/10/20 (!) 98/53   Pulse Readings from Last 3 Encounters:  08/02/20 75  07/25/20 91  07/10/20 82   In general this is a thin appearing caucasian male in no acute distress. He's alert and oriented x4 and appropriate throughout the examination. Cardiopulmonary assessment is negative for acute distress and he exhibits normal effort.    ECOG = 1  0 - Asymptomatic (Fully active, able to carry on all predisease activities without restriction)  1 - Symptomatic but completely ambulatory (Restricted in physically strenuous activity but ambulatory and able to carry out work of a light or sedentary nature. For example, light housework, office work)  2 - Symptomatic, <50% in bed during the day (Ambulatory and capable of all self care but unable to carry out any work activities. Up and about more  than 50% of waking hours)  3 - Symptomatic, >50% in bed, but not bedbound (Capable of only limited self-care, confined to bed or chair 50% or more of waking hours)  4 - Bedbound (Completely disabled. Cannot carry on any self-care. Totally confined to bed or chair)  5 - Death   Eustace Pen MM, Creech RH, Tormey DC, et al. [phone]). "Toxicity and response criteria of the [facility]". [place] Oncol. 5 (6): 649-55    LABORATORY DATA:  Lab Results  Component Value Date   WBC 7.8 07/23/2020   HGB 8.2 (L) 07/23/2020   HCT 25.0 (L) 07/23/2020   MCV 96.9 07/23/2020   PLT 242 07/23/2020   Lab Results  Component Value Date   NA 135 07/23/2020   K 4.9 07/23/2020   CL 102 07/23/2020   CO2 23 07/23/2020   Lab Results  Component Value Date   ALT 11 07/23/2020   AST 11 (L) 07/23/2020   ALKPHOS 78 07/23/2020   BILITOT 0.3 07/23/2020      RADIOGRAPHY: CT Chest Wo Contrast  Result Date: 07/25/2020 CLINICAL DATA:  Primary Cancer Type: Lung Imaging Indication: Assess response to therapy Interval  therapy since last imaging? Yes Initial Cancer Diagnosis Date: 03/22/2020; Established by: Biopsy-proven Detailed Pathology: Limited stage small cell lung cancer; poorly differentiated carcinoma, most consistent with adenocarcinoma. Primary Tumor location: Right upper lobe. Surgeries: No. Chemotherapy: Yes; Ongoing? No; Most recent administration: 06/25/2020 Immunotherapy? No Radiation therapy? Yes; Date Range: 04/17/2020 - 06/01/2020; Target: Right lung EXAM: CT CHEST WITHOUT CONTRAST TECHNIQUE: Multidetector CT imaging of the chest was performed following the standard protocol without IV contrast. COMPARISON:  Most recent CT chest 03/22/2020. 03/01/2020 PET-CT. MR abdomen 06/23/2020. FINDINGS: Cardiovascular: Atherosclerotic calcification of the aorta and coronary arteries. Heart size normal. No pericardial effusion. Mediastinum/Nodes: No pathologically enlarged mediastinal or axillary lymph nodes. Hilar regions are difficult to evaluate without IV contrast. Esophagus is grossly unremarkable. Lungs/Pleura: Bullous centrilobular and paraseptal emphysema. Spiculated nodule in the posterior segment right upper lobe is stable, measuring 0.9 x 1.8 cm (3/45). Associated fiducial markers. Lungs are otherwise clear. No pleural fluid. Minimal debris in the airway. Upper Abdomen: Subcentimeter low-attenuation lesions in the liver are too small to characterize. Visualized portions of the adrenal glands are unremarkable. Vague parenchymal calcification in the upper pole right kidney. Mildly hyperdense 2.4 cm lesion off the upper pole left kidney is difficult to further characterize without post-contrast imaging. Visualized portions of the spleen, pancreas, stomach and bowel are grossly unremarkable. Musculoskeletal: Degenerative changes in the spine. No worrisome lytic or sclerotic lesions. IMPRESSION: 1. Spiculated right upper lobe nodule, stable. No evidence of metastatic disease. 2. Mildly hyperdense exophytic lesion off  the left kidney, characterized as a probable renal cell carcinoma on 06/23/2020. A 3. Aortic atherosclerosis (ICD10-I70.0). Coronary artery calcification. 4.  Emphysema (ICD10-J43.9). Electronically Signed   By: Lorin Picket M.D.   On: 07/25/2020 10:36       IMPRESSION/PLAN: 1. 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 Dr. Lisbeth Renshaw discusses the pathology findings and reviews the nature of how small cell carcinoma can cause high rates of disease development in the brain. For this reason we discussed the utility of prophylactic cranial irradiation (PCI). We discussed the risks, benefits, short, and long term effects of radiotherapy, as well as the curative intent, and the patient is interested in proceeding. Dr. Lisbeth Renshaw discusses the delivery and logistics of radiotherapy and anticipates  a course of 2 weeks of radiotherapy. Written consent is obtained and placed in the chart, a copy was provided to the patient. We will proceed with an MRI of the brain now to rule out disease prior to starting PCI therapy. We also discussed the risks of changes in memory from therapy and the patient was offered a prescription for namenda which is part of the NCCN guidelines to try to prevent changes in memory for the long term. We reviewed the titration of this, continuation after starting this the day of radiation for 6 months, as well as the side effect profile. He is in agreement and we will schedule his simulation once we know the date of his MRI. 2. Left renal lesion. He is going to follow up with urology and possibly consider a second opinion regarding nephrectomy.     In a visit lasting 45 minutes, greater than 50% of the time was spent face to face discussing the patient's condition, in preparation for the discussion, and coordinating the patient's care.   The above documentation reflects my direct findings during this shared patient visit. Please  see the separate note by Dr. Lisbeth Renshaw on this date for the remainder of the patient's plan of care.    Carola Rhine, PAC

## 2020-08-02 NOTE — Addendum Note (Signed)
Encounter addended by: Sherrlyn Hock, RN on: 08/02/2020 2:30 PM  Actions taken: Flowsheet accepted

## 2020-08-05 ENCOUNTER — Telehealth: Payer: Self-pay | Admitting: Radiation Oncology

## 2020-08-05 NOTE — Telephone Encounter (Signed)
I called to let the patient know we could schedule simulation now that we know the date of his MRI on 08/11/20. I reserved a spot for him on 08/13/20 at 1:30pm and gave him the number to call back if that time does not suit him.

## 2020-08-11 ENCOUNTER — Other Ambulatory Visit: Payer: Self-pay

## 2020-08-11 ENCOUNTER — Ambulatory Visit (HOSPITAL_BASED_OUTPATIENT_CLINIC_OR_DEPARTMENT_OTHER)
Admission: RE | Admit: 2020-08-11 | Discharge: 2020-08-11 | Disposition: A | Discharge status: Home / Self Care | Payer: Medicare Other | Source: Ambulatory Visit | Attending: Radiation Oncology | Admitting: Radiation Oncology

## 2020-08-11 DIAGNOSIS — C3490 Malignant neoplasm of unspecified part of unspecified bronchus or lung: Secondary | ICD-10-CM | POA: Diagnosis not present

## 2020-08-11 MED ORDER — GADOBUTROL 1 MMOL/ML IV SOLN
6.0000 mL | Freq: Once | INTRAVENOUS | Status: AC | PRN
  Administered 2020-08-11: 6 mL via INTRAVENOUS

## 2020-08-13 ENCOUNTER — Telehealth: Payer: Self-pay | Admitting: Radiation Oncology

## 2020-08-13 ENCOUNTER — Ambulatory Visit
Admission: RE | Admit: 2020-08-13 | Discharge: 2020-08-13 | Disposition: A | Discharge status: Home / Self Care | Payer: Medicare Other | Source: Ambulatory Visit | Attending: Radiation Oncology | Admitting: Radiation Oncology

## 2020-08-13 ENCOUNTER — Other Ambulatory Visit: Payer: Self-pay

## 2020-08-13 DIAGNOSIS — C3411 Malignant neoplasm of upper lobe, right bronchus or lung: Secondary | ICD-10-CM | POA: Diagnosis not present

## 2020-08-13 DIAGNOSIS — N2889 Other specified disorders of kidney and ureter: Secondary | ICD-10-CM | POA: Diagnosis not present

## 2020-08-13 DIAGNOSIS — C3491 Malignant neoplasm of unspecified part of right bronchus or lung: Secondary | ICD-10-CM | POA: Diagnosis not present

## 2020-08-13 DIAGNOSIS — Z298 Encounter for other specified prophylactic measures: Secondary | ICD-10-CM | POA: Diagnosis not present

## 2020-08-13 NOTE — Telephone Encounter (Signed)
I called the patient to let him know his MRI results did not show active disease in the brain, I had to leave him a voicemail.

## 2020-08-16 DIAGNOSIS — C3411 Malignant neoplasm of upper lobe, right bronchus or lung: Secondary | ICD-10-CM | POA: Diagnosis not present

## 2020-08-16 DIAGNOSIS — Z298 Encounter for other specified prophylactic measures: Secondary | ICD-10-CM | POA: Diagnosis not present

## 2020-08-16 DIAGNOSIS — N2889 Other specified disorders of kidney and ureter: Secondary | ICD-10-CM | POA: Diagnosis not present

## 2020-08-16 DIAGNOSIS — C3491 Malignant neoplasm of unspecified part of right bronchus or lung: Secondary | ICD-10-CM | POA: Diagnosis not present

## 2020-08-20 ENCOUNTER — Other Ambulatory Visit: Payer: Self-pay

## 2020-08-20 ENCOUNTER — Ambulatory Visit
Admission: RE | Admit: 2020-08-20 | Discharge: 2020-08-20 | Disposition: A | Discharge status: Home / Self Care | Payer: Medicare Other | Source: Ambulatory Visit | Attending: Radiation Oncology | Admitting: Radiation Oncology

## 2020-08-20 DIAGNOSIS — Z298 Encounter for other specified prophylactic measures: Secondary | ICD-10-CM | POA: Diagnosis not present

## 2020-08-20 DIAGNOSIS — C3411 Malignant neoplasm of upper lobe, right bronchus or lung: Secondary | ICD-10-CM | POA: Diagnosis not present

## 2020-08-20 DIAGNOSIS — C3491 Malignant neoplasm of unspecified part of right bronchus or lung: Secondary | ICD-10-CM | POA: Diagnosis not present

## 2020-08-20 DIAGNOSIS — N2889 Other specified disorders of kidney and ureter: Secondary | ICD-10-CM | POA: Diagnosis not present

## 2020-08-21 ENCOUNTER — Ambulatory Visit
Admission: RE | Admit: 2020-08-21 | Discharge: 2020-08-21 | Disposition: A | Discharge status: Home / Self Care | Payer: Medicare Other | Source: Ambulatory Visit | Attending: Radiation Oncology | Admitting: Radiation Oncology

## 2020-08-21 DIAGNOSIS — C3491 Malignant neoplasm of unspecified part of right bronchus or lung: Secondary | ICD-10-CM | POA: Diagnosis not present

## 2020-08-21 DIAGNOSIS — Z298 Encounter for other specified prophylactic measures: Secondary | ICD-10-CM | POA: Diagnosis not present

## 2020-08-21 DIAGNOSIS — N2889 Other specified disorders of kidney and ureter: Secondary | ICD-10-CM | POA: Diagnosis not present

## 2020-08-21 DIAGNOSIS — C3411 Malignant neoplasm of upper lobe, right bronchus or lung: Secondary | ICD-10-CM | POA: Diagnosis not present

## 2020-08-22 ENCOUNTER — Ambulatory Visit
Admission: RE | Admit: 2020-08-22 | Discharge: 2020-08-22 | Disposition: A | Discharge status: Home / Self Care | Payer: Medicare Other | Source: Ambulatory Visit | Attending: Radiation Oncology | Admitting: Radiation Oncology

## 2020-08-22 ENCOUNTER — Other Ambulatory Visit: Payer: Self-pay

## 2020-08-22 DIAGNOSIS — C3491 Malignant neoplasm of unspecified part of right bronchus or lung: Secondary | ICD-10-CM | POA: Diagnosis not present

## 2020-08-22 DIAGNOSIS — N2889 Other specified disorders of kidney and ureter: Secondary | ICD-10-CM | POA: Diagnosis not present

## 2020-08-23 ENCOUNTER — Ambulatory Visit
Admission: RE | Admit: 2020-08-23 | Discharge: 2020-08-23 | Disposition: A | Discharge status: Home / Self Care | Payer: Medicare Other | Source: Ambulatory Visit | Attending: Radiation Oncology | Admitting: Radiation Oncology

## 2020-08-23 DIAGNOSIS — N2889 Other specified disorders of kidney and ureter: Secondary | ICD-10-CM | POA: Diagnosis not present

## 2020-08-23 DIAGNOSIS — C3491 Malignant neoplasm of unspecified part of right bronchus or lung: Secondary | ICD-10-CM | POA: Diagnosis not present

## 2020-08-24 ENCOUNTER — Ambulatory Visit
Admission: RE | Admit: 2020-08-24 | Discharge: 2020-08-24 | Disposition: A | Discharge status: Home / Self Care | Payer: Medicare Other | Source: Ambulatory Visit | Attending: Radiation Oncology | Admitting: Radiation Oncology

## 2020-08-24 ENCOUNTER — Other Ambulatory Visit: Payer: Self-pay

## 2020-08-24 DIAGNOSIS — N2889 Other specified disorders of kidney and ureter: Secondary | ICD-10-CM | POA: Diagnosis not present

## 2020-08-24 DIAGNOSIS — C3491 Malignant neoplasm of unspecified part of right bronchus or lung: Secondary | ICD-10-CM | POA: Diagnosis not present

## 2020-08-27 ENCOUNTER — Ambulatory Visit
Admission: RE | Admit: 2020-08-27 | Discharge: 2020-08-27 | Disposition: A | Discharge status: Home / Self Care | Payer: Medicare Other | Source: Ambulatory Visit | Attending: Radiation Oncology | Admitting: Radiation Oncology

## 2020-08-27 ENCOUNTER — Other Ambulatory Visit: Payer: Self-pay

## 2020-08-27 DIAGNOSIS — C3491 Malignant neoplasm of unspecified part of right bronchus or lung: Secondary | ICD-10-CM | POA: Diagnosis not present

## 2020-08-27 DIAGNOSIS — N2889 Other specified disorders of kidney and ureter: Secondary | ICD-10-CM

## 2020-08-27 NOTE — Progress Notes (Signed)
  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 During treatment he did very well with radiotherapy and did not have significant desquamation. He is currently under treatment for prophylactic cranial irradiation due to his small cell component. He received treatment today and reports he is doing well with his. His swallowing from prior treatment is no longer an issue for him.  Impression/Plan: 1.  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 The patient has been doing well since completion of radiotherapy. We will complete his PCI treatments soon and follow him in brain oncology conference, but he will also continue to follow up with Dr. Julien Nordmann in medical oncology.  2. Left renal lesion. The patient was found to have this in the midst of his workup for #1. He is also considering a second opinion regarding nephrectomy and requests a referral. A new referral was placed for [facility] in [place].       Carola Rhine, PAC

## 2020-08-28 ENCOUNTER — Telehealth: Payer: Self-pay | Admitting: *Deleted

## 2020-08-28 ENCOUNTER — Ambulatory Visit
Admission: RE | Admit: 2020-08-28 | Discharge: 2020-08-28 | Disposition: A | Discharge status: Home / Self Care | Payer: Medicare Other | Source: Ambulatory Visit | Attending: Radiation Oncology | Admitting: Radiation Oncology

## 2020-08-28 DIAGNOSIS — Z298 Encounter for other specified prophylactic measures: Secondary | ICD-10-CM | POA: Diagnosis not present

## 2020-08-28 DIAGNOSIS — C3491 Malignant neoplasm of unspecified part of right bronchus or lung: Secondary | ICD-10-CM | POA: Diagnosis not present

## 2020-08-28 DIAGNOSIS — N2889 Other specified disorders of kidney and ureter: Secondary | ICD-10-CM | POA: Diagnosis not present

## 2020-08-28 DIAGNOSIS — C3411 Malignant neoplasm of upper lobe, right bronchus or lung: Secondary | ICD-10-CM | POA: Diagnosis not present

## 2020-08-28 NOTE — Telephone Encounter (Signed)
CALLED PATIENT TO INFORM OF APPT. WITH  DR. Brendia Sacks ON 10-09-20 - ARRIVAL TIME - 8 AM- ADDRESS- [address] Ian Hart., PHONE NUMBER - [phone], SPOKE WITH PATIENT AND HE IS AWARE OF THIS APPT.

## 2020-08-29 ENCOUNTER — Other Ambulatory Visit: Payer: Self-pay

## 2020-08-29 ENCOUNTER — Ambulatory Visit
Admission: RE | Admit: 2020-08-29 | Discharge: 2020-08-29 | Disposition: A | Discharge status: Home / Self Care | Payer: Medicare Other | Source: Ambulatory Visit | Attending: Radiation Oncology | Admitting: Radiation Oncology

## 2020-08-29 DIAGNOSIS — N2889 Other specified disorders of kidney and ureter: Secondary | ICD-10-CM | POA: Diagnosis not present

## 2020-08-29 DIAGNOSIS — C3491 Malignant neoplasm of unspecified part of right bronchus or lung: Secondary | ICD-10-CM | POA: Diagnosis not present

## 2020-08-30 ENCOUNTER — Ambulatory Visit
Admission: RE | Admit: 2020-08-30 | Discharge: 2020-08-30 | Disposition: A | Discharge status: Home / Self Care | Payer: Medicare Other | Source: Ambulatory Visit | Attending: Radiation Oncology | Admitting: Radiation Oncology

## 2020-08-30 ENCOUNTER — Other Ambulatory Visit: Payer: Self-pay

## 2020-08-30 DIAGNOSIS — C3491 Malignant neoplasm of unspecified part of right bronchus or lung: Secondary | ICD-10-CM | POA: Diagnosis not present

## 2020-08-30 DIAGNOSIS — N2889 Other specified disorders of kidney and ureter: Secondary | ICD-10-CM | POA: Diagnosis not present

## 2020-08-31 ENCOUNTER — Ambulatory Visit
Admission: RE | Admit: 2020-08-31 | Discharge: 2020-08-31 | Disposition: A | Discharge status: Home / Self Care | Payer: Medicare Other | Source: Ambulatory Visit | Attending: Radiation Oncology | Admitting: Radiation Oncology

## 2020-08-31 ENCOUNTER — Encounter: Payer: Self-pay | Admitting: Radiation Oncology

## 2020-08-31 DIAGNOSIS — N2889 Other specified disorders of kidney and ureter: Secondary | ICD-10-CM | POA: Diagnosis not present

## 2020-08-31 DIAGNOSIS — C3491 Malignant neoplasm of unspecified part of right bronchus or lung: Secondary | ICD-10-CM | POA: Diagnosis not present

## 2020-09-03 ENCOUNTER — Encounter: Payer: Self-pay | Admitting: Internal Medicine

## 2020-09-03 ENCOUNTER — Encounter: Payer: Self-pay | Admitting: Physician Assistant

## 2020-09-03 NOTE — Progress Notes (Signed)
  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  Intent: Palliative  Radiation Treatment Dates: 08/20/2020 through 08/31/2020 Site Technique Total Dose (Gy) Dose per Fx (Gy) Completed Fx Beam Energies  Brain: Brain Complex 25/25 2.5 10/10 6X   Narrative: The patient tolerated radiation therapy relatively well.   Plan: The patient will receive a call in about one month from the radiation oncology department. He will continue follow up with Dr. Julien Nordmann as well.   ________________________________________________    Carola Rhine, [facility]

## 2020-10-08 ENCOUNTER — Other Ambulatory Visit: Payer: Self-pay | Admitting: Family

## 2020-10-09 ENCOUNTER — Other Ambulatory Visit: Payer: Self-pay

## 2020-10-09 DIAGNOSIS — R82998 Other abnormal findings in urine: Secondary | ICD-10-CM | POA: Diagnosis not present

## 2020-10-09 DIAGNOSIS — C3490 Malignant neoplasm of unspecified part of unspecified bronchus or lung: Secondary | ICD-10-CM | POA: Diagnosis not present

## 2020-10-09 DIAGNOSIS — Z9221 Personal history of antineoplastic chemotherapy: Secondary | ICD-10-CM | POA: Diagnosis not present

## 2020-10-09 DIAGNOSIS — N2889 Other specified disorders of kidney and ureter: Secondary | ICD-10-CM | POA: Diagnosis not present

## 2020-10-22 ENCOUNTER — Ambulatory Visit (HOSPITAL_BASED_OUTPATIENT_CLINIC_OR_DEPARTMENT_OTHER)
Admission: RE | Admit: 2020-10-22 | Discharge: 2020-10-22 | Disposition: A | Discharge status: Home / Self Care | Payer: Medicare Other | Source: Ambulatory Visit | Attending: Internal Medicine | Admitting: Internal Medicine

## 2020-10-22 ENCOUNTER — Other Ambulatory Visit: Payer: Self-pay

## 2020-10-22 ENCOUNTER — Inpatient Hospital Stay: Payer: Medicare Other | Attending: Internal Medicine

## 2020-10-22 DIAGNOSIS — C3490 Malignant neoplasm of unspecified part of unspecified bronchus or lung: Secondary | ICD-10-CM | POA: Insufficient documentation

## 2020-10-22 DIAGNOSIS — J439 Emphysema, unspecified: Secondary | ICD-10-CM | POA: Diagnosis not present

## 2020-10-22 DIAGNOSIS — I700 Atherosclerosis of aorta: Secondary | ICD-10-CM | POA: Diagnosis not present

## 2020-10-22 DIAGNOSIS — R911 Solitary pulmonary nodule: Secondary | ICD-10-CM | POA: Diagnosis not present

## 2020-10-22 DIAGNOSIS — Z87891 Personal history of nicotine dependence: Secondary | ICD-10-CM | POA: Insufficient documentation

## 2020-10-22 DIAGNOSIS — C3411 Malignant neoplasm of upper lobe, right bronchus or lung: Secondary | ICD-10-CM | POA: Insufficient documentation

## 2020-10-22 DIAGNOSIS — Z85110 Personal history of malignant carcinoid tumor of bronchus and lung: Secondary | ICD-10-CM | POA: Diagnosis not present

## 2020-10-24 ENCOUNTER — Other Ambulatory Visit: Payer: Self-pay

## 2020-10-24 ENCOUNTER — Encounter: Payer: Self-pay | Admitting: Internal Medicine

## 2020-10-24 ENCOUNTER — Inpatient Hospital Stay: Payer: Medicare Other

## 2020-10-24 ENCOUNTER — Inpatient Hospital Stay (HOSPITAL_BASED_OUTPATIENT_CLINIC_OR_DEPARTMENT_OTHER): Payer: Medicare Other | Admitting: Internal Medicine

## 2020-10-24 VITALS — BP 136/62 | HR 74 | Temp 96.5°F | Resp 18 | Ht 73.0 in | Wt 128.7 lb

## 2020-10-24 DIAGNOSIS — C3411 Malignant neoplasm of upper lobe, right bronchus or lung: Secondary | ICD-10-CM | POA: Diagnosis not present

## 2020-10-24 DIAGNOSIS — C3491 Malignant neoplasm of unspecified part of right bronchus or lung: Secondary | ICD-10-CM

## 2020-10-24 DIAGNOSIS — C3490 Malignant neoplasm of unspecified part of unspecified bronchus or lung: Secondary | ICD-10-CM | POA: Diagnosis not present

## 2020-10-24 DIAGNOSIS — Z87891 Personal history of nicotine dependence: Secondary | ICD-10-CM | POA: Diagnosis not present

## 2020-10-24 LAB — CMP (CANCER CENTER ONLY)
ALT: 15 U/L (ref 0–44)
AST: 18 U/L (ref 15–41)
Albumin: 3.8 g/dL (ref 3.5–5.0)
Alkaline Phosphatase: 71 U/L (ref 38–126)
Anion gap: 7 (ref 5–15)
BUN: 18 mg/dL (ref 8–23)
CO2: 25 mmol/L (ref 22–32)
Calcium: 9.2 mg/dL (ref 8.9–10.3)
Chloride: 101 mmol/L (ref 98–111)
Creatinine: 1.23 mg/dL (ref 0.61–1.24)
GFR, Estimated: >60 mL/min (ref >60)
Glucose, Bld: 83 mg/dL (ref 70–99)
Potassium: 4.6 mmol/L (ref 3.5–5.1)
Sodium: 133 mmol/L — ABNORMAL LOW (ref 135–145)
Total Bilirubin: 0.4 mg/dL (ref 0.3–1.2)
Total Protein: 6.7 g/dL (ref 6.5–8.1)

## 2020-10-24 LAB — CBC WITH DIFFERENTIAL (CANCER CENTER ONLY)
Abs Immature Granulocytes: 0.02 10*3/uL (ref 0.00–0.07)
Basophils Absolute: 0 10*3/uL (ref 0.0–0.1)
Basophils Relative: 1 %
Eosinophils Absolute: 0.1 10*3/uL (ref 0.0–0.5)
Eosinophils Relative: 2 %
HCT: 36.4 % — ABNORMAL LOW (ref 39.0–52.0)
Hemoglobin: 12.5 g/dL — ABNORMAL LOW (ref 13.0–17.0)
Immature Granulocytes: 0 %
Lymphocytes Relative: 12 %
Lymphs Abs: 0.6 10*3/uL — ABNORMAL LOW (ref 0.7–4.0)
MCH: 33.1 pg (ref 26.0–34.0)
MCHC: 34.3 g/dL (ref 30.0–36.0)
MCV: 96.3 fL (ref 80.0–100.0)
Monocytes Absolute: 0.7 10*3/uL (ref 0.1–1.0)
Monocytes Relative: 14 %
Neutro Abs: 3.6 10*3/uL (ref 1.7–7.7)
Neutrophils Relative %: 71 %
Platelet Count: 157 10*3/uL (ref 150–400)
RBC: 3.78 MIL/uL — ABNORMAL LOW (ref 4.22–5.81)
RDW: 11.9 % (ref 11.5–15.5)
WBC Count: 5 10*3/uL (ref 4.0–10.5)
nRBC: 0 % (ref 0.0–0.2)

## 2020-10-24 NOTE — Progress Notes (Signed)
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 3) Suspicious left renal lesion   PRIOR THERAPY: Systemic chemotherapy/radiation with cisplatin 80 mg per metered squared on day 1 and etoposide 100 mg per metered squared on days 1, 2, and 3 IV every 3 weeks.  Status post 4 cycles.  Starting from cycle #3, cisplatin was changed to carboplatin for an AUC of 5 due to renal insufficiency. First dose on 04/20/20. Onpro was added to the treatment plan starting from cycle #3 due to pancytopenia.     CURRENT THERAPY:  Observation  INTERVAL HISTORY: Ian Hart 70 y.o. male returns to the clinic today for follow-up visit accompanied by his wife.  The patient is feeling fine today with no concerning complaints.  He denied having any chest pain, shortness of breath, cough or hemoptysis.  He denied having any fever or chills.  He has no nausea, vomiting, diarrhea or constipation.  He has no headache or visual changes.  He has no significant weight loss or night sweats.  He is very active.  The patient had repeat CT scan of the chest performed recently and he is here for evaluation and discussion of his discuss results.  MEDICAL HISTORY: Past Medical History:  Diagnosis Date   History of chicken pox    History of shingles 04/2010   Lung cancer ([facility]) 02/2020    ALLERGIES:  is allergic to acyclovir and related.  MEDICATIONS:  Current Outpatient Medications  Medication Sig Dispense Refill   ascorbic acid (VITAMIN C) 500 MG tablet Take 500 mg by mouth daily. (Patient not taking: Reported on 08/02/2020)     buPROPion (WELLBUTRIN XL) 300 MG 24 hr tablet TAKE 1 TABLET BY MOUTH DAILY 90  tablet 0   HYDROcodone-acetaminophen (HYCET) 7.5-325 mg/15 ml solution Take 10 mL ever 3-4 hours prn pain (Patient not taking: Reported on 08/02/2020) 473 mL 0   memantine (NAMENDA) 10 MG tablet Take 1 tablet (10 mg total) by mouth 2 (two) times daily. 60 tablet 4   memantine (NAMENDA) 5 MG tablet Begin this prescription the first day of brain radiation. Week 1: take one tablet po qam. Week 2: take one tablet qam and qpm. Week 3: take two tablets qam, and one tablet po q pm. Week 4: take two tablets qam and qpm. Fill subsequent prescription q month. 70 tablet 0   Multiple Vitamin (MULTIVITAMIN) tablet Take 1 tablet by mouth daily.     nicotine (NICODERM CQ - DOSED IN MG/24 HOURS) 14 mg/24hr patch Place 1 patch (14 mg total) onto the skin daily. (Patient not taking: Reported on 08/02/2020) 30 patch 1   omeprazole (PRILOSEC) 20 MG capsule Take 1 capsule (20 mg total) by mouth daily. (Patient not taking: Reported on 08/02/2020) 30 capsule 1   prochlorperazine (COMPAZINE) 10 MG tablet Take 1 tablet (10 mg total) by mouth every 6 (six) hours as needed. (Patient not taking: Reported on 08/02/2020) 30 tablet 2   sucralfate (CARAFATE) 1 g tablet Take 1 tablet (1 g total) by mouth 4 (four) times daily. Dissolve each tablet in 15 cc water before use. (Patient not taking: Reported on 08/02/2020) 120 tablet 2   umeclidinium-vilanterol (ANORO  ELLIPTA) 62.5-25 MCG/INH AEPB Inhale 1 puff into the lungs daily. (Patient not taking: Reported on 08/02/2020) 3 each 2   vitamin B-12 (CYANOCOBALAMIN) 1000 MCG tablet Take 1,000 mcg by mouth daily.     No current facility-administered medications for this visit.    SURGICAL HISTORY:  Past Surgical History:  Procedure Laterality Date   BRONCHIAL BIOPSY  03/22/2020   Procedure: BRONCHIAL BIOPSIES;  Surgeon: Garner Nash, DO;  Location: [facility];  Service: Pulmonary;;   BRONCHIAL BRUSHINGS  03/22/2020   Procedure: BRONCHIAL BRUSHINGS;  Surgeon: Garner Nash, DO;   Location: [facility];  Service: Pulmonary;;   BRONCHIAL NEEDLE ASPIRATION BIOPSY  03/22/2020   Procedure: BRONCHIAL NEEDLE ASPIRATION BIOPSIES;  Surgeon: Garner Nash, DO;  Location: [facility];  Service: Pulmonary;;   BRONCHIAL WASHINGS  03/22/2020   Procedure: BRONCHIAL WASHINGS;  Surgeon: Garner Nash, DO;  Location: [facility];  Service: Pulmonary;;   FIDUCIAL MARKER PLACEMENT  03/22/2020   Procedure: FIDUCIAL MARKER PLACEMENT;  Surgeon: Garner Nash, DO;  Location: [facility];  Service: Pulmonary;;   NO PAST SURGERIES     VIDEO BRONCHOSCOPY WITH ENDOBRONCHIAL NAVIGATION N/A 03/22/2020   Procedure: VIDEO BRONCHOSCOPY WITH ENDOBRONCHIAL NAVIGATION;  Surgeon: Garner Nash, DO;  Location: [facility];  Service: Pulmonary;  Laterality: N/A;   VIDEO BRONCHOSCOPY WITH ENDOBRONCHIAL ULTRASOUND N/A 03/22/2020   Procedure: VIDEO BRONCHOSCOPY WITH ENDOBRONCHIAL ULTRASOUND;  Surgeon: Garner Nash, DO;  Location: [facility];  Service: Pulmonary;  Laterality: N/A;    REVIEW OF SYSTEMS:  A comprehensive review of systems was negative except for: Constitutional: positive for fatigue   PHYSICAL EXAMINATION: General appearance: alert, cooperative, fatigued, and no distress Head: Normocephalic, without obvious abnormality, atraumatic Neck: no adenopathy, no JVD, supple, symmetrical, trachea midline, and thyroid not enlarged, symmetric, no tenderness/mass/nodules Lymph nodes: Cervical, supraclavicular, and axillary nodes normal. Resp: clear to auscultation bilaterally Back: symmetric, no curvature. ROM normal. No CVA tenderness. Cardio: regular rate and rhythm, S1, S2 normal, no murmur, click, rub or gallop GI: soft, non-tender; bowel sounds normal; no masses,  no organomegaly Extremities: extremities normal, atraumatic, no cyanosis or edema  ECOG PERFORMANCE STATUS: 1 - Symptomatic but completely ambulatory  Blood pressure 136/62, pulse 74, temperature (!) 96.5 F (35.8 C),  temperature source Tympanic, resp. rate 18, height 6\' 1"  (1.854 m), weight 128 lb 11.2 oz (58.4 kg), SpO2 100 %.  LABORATORY DATA: Lab Results  Component Value Date   WBC 7.8 07/23/2020   HGB 8.2 (L) 07/23/2020   HCT 25.0 (L) 07/23/2020   MCV 96.9 07/23/2020   PLT 242 07/23/2020      Chemistry      Component Value Date/Time   NA 135 07/23/2020 0841   K 4.9 07/23/2020 0841   CL 102 07/23/2020 0841   CO2 23 07/23/2020 0841   BUN 24 (H) 07/23/2020 0841   CREATININE 1.18 07/23/2020 0841   CREATININE 0.86 11/12/2011 0917      Component Value Date/Time   CALCIUM 9.1 07/23/2020 0841   ALKPHOS 78 07/23/2020 0841   AST 11 (L) 07/23/2020 0841   ALT 11 07/23/2020 0841   BILITOT 0.3 07/23/2020 0841       RADIOGRAPHIC STUDIES: CT Chest Wo Contrast  Result Date: 10/22/2020 CLINICAL DATA:  Lung cancer screening. History of lung cancer. Former smoker. EXAM: CT CHEST WITHOUT CONTRAST TECHNIQUE: Multidetector CT imaging of the chest was performed following the standard protocol without IV contrast. COMPARISON:  07/24/2020 FINDINGS: Cardiovascular: Heart size and  mediastinal contours appear normal. There is no pericardial effusion. Aortic atherosclerosis. Mediastinum/Nodes: Normal appearance of the thyroid gland. The trachea appears patent and is midline. Normal appearance of the esophagus. No enlarged lymph nodes. Lungs/Pleura: Paraseptal and centrilobular emphysema. No pleural effusion, airspace consolidation, or atelectasis. -Index nodule within the medial right upper lobe containing nearby fiducial markers measures 1.6 x 0.7 cm, image 46/4. Formally 1.8 by 0.9 cm. -Tiny granuloma within the anterior left upper lobe is unchanged, image 73/4. Upper Abdomen: No acute abnormality. Unchanged mildly hyperdense lesion off the posterior cortex of the upper pole of left kidney measuring 2.5 cm. Incompletely characterized without IV contrast. Musculoskeletal: Scoliosis and degenerative disc disease. No  acute or suspicious osseous findings. IMPRESSION: 1. Stable to decreased size of spiculated right upper lobe lung nodule. No signs of metastatic disease. 2. No significant change and hyperdense exophytic lesion off the left kidney, characterized as probable renal cell carcinoma on 06/23/2020. 3. Emphysema and aortic atherosclerosis. Aortic Atherosclerosis (ICD10-I70.0) and Emphysema (ICD10-J43.9). Electronically Signed   By: Kerby Moors M.D.   On: 10/22/2020 15:07    ASSESSMENT AND PLAN: This is a very pleasant 70 years old white male diagnosed with poorly differentiated carcinoma consistent with adenocarcinoma in the right upper lobe.  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  The patient also has a suspicious left renal lesion concerning for renal cell carcinoma. He is status post a course of systemic chemotherapy initially with cisplatin and etoposide but the cisplatin was discontinued secondary to renal insufficiency and the patient continued 3 more cycles of his systemic chemotherapy with carboplatin and etoposide concurrent with radiation.  He has a rough time with the treatment including pancytopenia as well as fatigue and weight loss. The patient is currently on observation and he is feeling fine today with no concerning complaints. Repeat CT scan of the chest performed recently showed stable to decreased size of the spiculated right upper lobe lung nodule with no evidence for disease progression. I recommended for the patient to continue on observation with repeat CT scan of the chest in 3 months. Regarding the exophytic lesion of the left kidney, he was seen by urology at [facility] and expected to have surgical resection. He was advised to call immediately if he has any other concerning symptoms in the interval. The patient voices understanding of current disease status and treatment options and is in agreement with the current care  plan.  All questions were answered. The patient knows to call the clinic with any problems, questions or concerns. We can certainly see the patient much sooner if necessary.   Disclaimer: This note was dictated with voice recognition software. Similar sounding words can inadvertently be transcribed and may not be corrected upon review.

## 2020-10-29 ENCOUNTER — Other Ambulatory Visit: Payer: Self-pay

## 2020-10-29 ENCOUNTER — Ambulatory Visit
Admission: RE | Admit: 2020-10-29 | Discharge: 2020-10-29 | Disposition: A | Discharge status: Home / Self Care | Payer: Medicare Other | Source: Ambulatory Visit | Attending: Internal Medicine | Admitting: Internal Medicine

## 2020-10-29 ENCOUNTER — Telehealth: Payer: Self-pay | Admitting: Internal Medicine

## 2020-10-29 DIAGNOSIS — C3491 Malignant neoplasm of unspecified part of right bronchus or lung: Secondary | ICD-10-CM

## 2020-10-29 NOTE — Progress Notes (Signed)
  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  Interval Since Last Radiation:  9 weeks   08/20/2020 through 08/31/2020 Site Technique Total Dose (Gy) Dose per Fx (Gy) Completed Fx Beam Energies  Brain: Brain Complex 25/25 2.5 10/10 6X    Narrative:  The patient was contacted today for routine follow-up. During treatment he did very well with radiotherapy and did not have significant desquamation.   Impression/Plan: 1. Stage IIIA, [id number], mixed histology NSCLC and small cell carcinoma of the RUL and subcarinal nodal station. I was unable to reach the patient. He will continue to follow up with Dr. Julien Nordmann and we will plan post treatment MRI brain for surveillance. He will be due for an MRI next month.     Carola Rhine, PAC

## 2020-10-29 NOTE — Telephone Encounter (Signed)
Scheduled per los. Called and left msg. Mailed printout  °

## 2020-11-05 DIAGNOSIS — N2889 Other specified disorders of kidney and ureter: Secondary | ICD-10-CM | POA: Diagnosis not present

## 2020-11-05 DIAGNOSIS — J984 Other disorders of lung: Secondary | ICD-10-CM | POA: Diagnosis not present

## 2020-11-13 ENCOUNTER — Ambulatory Visit (INDEPENDENT_AMBULATORY_CARE_PROVIDER_SITE_OTHER): Payer: Medicare Other | Admitting: Family

## 2020-11-13 ENCOUNTER — Other Ambulatory Visit: Payer: Self-pay

## 2020-11-13 VITALS — BP 123/56 | HR 78 | Temp 98.6°F | Resp 16 | Ht 73.0 in | Wt 132.2 lb

## 2020-11-13 DIAGNOSIS — H9190 Unspecified hearing loss, unspecified ear: Secondary | ICD-10-CM

## 2020-11-13 DIAGNOSIS — H6993 Unspecified Eustachian tube disorder, bilateral: Secondary | ICD-10-CM | POA: Insufficient documentation

## 2020-11-13 DIAGNOSIS — R634 Abnormal weight loss: Secondary | ICD-10-CM | POA: Diagnosis not present

## 2020-11-13 DIAGNOSIS — Z720 Tobacco use: Secondary | ICD-10-CM | POA: Diagnosis not present

## 2020-11-13 DIAGNOSIS — R197 Diarrhea, unspecified: Secondary | ICD-10-CM

## 2020-11-13 DIAGNOSIS — H6983 Other specified disorders of Eustachian tube, bilateral: Secondary | ICD-10-CM

## 2020-11-13 DIAGNOSIS — C3491 Malignant neoplasm of unspecified part of right bronchus or lung: Secondary | ICD-10-CM

## 2020-11-13 LAB — TSH: TSH: 2.56 u[IU]/mL (ref 0.35–5.50)

## 2020-11-13 MED ORDER — FLUTICASONE PROPIONATE 50 MCG/ACT NA SUSP: 2.0000 | Freq: Every day | NASAL | 6 refills | Status: DC

## 2020-11-13 NOTE — Patient Instructions (Signed)
Please complete lab work prior to leaving.   

## 2020-11-13 NOTE — Assessment & Plan Note (Signed)
S/p radiation/chemo- management per oncology.

## 2020-11-13 NOTE — Assessment & Plan Note (Signed)
Suggested that he also add some dried fruit nuts/nut butters to his diet and consider adding another carnation instant breakfast in the evening.

## 2020-11-13 NOTE — Progress Notes (Signed)
Subjective:     Patient ID: Ian Hart, male    DOB: [birth year], 70 y.o.   MRN: [id number]  Chief Complaint  Patient presents with   Follow-up    Here for routine follow up    Weight problem    Having trouble gaining weight back  since chemo and radiation between January and March 2022.    Ear Fullness    Complains of bilateral ear fullness    Ear Fullness     Patient is in today for follow up.  He was diagnosed with lung CA and underwent chemo and radiation between January and March of this year. He notes that he had some esophageal pain following radiation treatment which was associated with a 30 pound weight loss. He is now eating and drinking but still having issues gaining weight.   Wt Readings from Last 3 Encounters:  11/13/20 132 lb 3.2 oz (60 kg)  10/24/20 128 lb 11.2 oz (58.4 kg)  08/02/20 133 lb (60.3 kg)   He feels like his appetite is good.    Breakfast- bowl of cheerios, carnation instant breakfast + scoop of protein powder and 3 scoops of vanilla ice cream and whole milk  Lunch  Sandwich  Dinner Chicken/steak, Electrical engineer, vegetable  Snacks- occasionally  Throughout the day he drinks water and a coke, occasional beer or two.   He notes some post prandial diarrhea. This happens several times a day.   He c/o bilateral ear fullness.  Also feels like his hearing deteriorated after he had radiation to his brain. He would like further evaluation. He was placed on empiric namenda following brain radiation by oncology.   He continues to struggle with quitting smoking.  Did not have improvement in his cravings with wellbutrin.   Health Maintenance Due  Topic Date Due   Hepatitis C Screening  Never done   Zoster Vaccines- Shingrix (1 of 2) Never done   COVID-19 Vaccine (4 - Booster for Pfizer series) 06/09/2020   INFLUENZA VACCINE  11/12/2020    Past Medical History:  Diagnosis Date   History of chicken pox    History of shingles 04/2010    Lung cancer ([name]) 02/2020    Past Surgical History:  Procedure Laterality Date   BRONCHIAL BIOPSY  03/22/2020   Procedure: BRONCHIAL BIOPSIES;  Surgeon: Garner Nash, DO;  Location: [facility];  Service: Pulmonary;;   BRONCHIAL BRUSHINGS  03/22/2020   Procedure: BRONCHIAL BRUSHINGS;  Surgeon: Garner Nash, DO;  Location: [facility];  Service: Pulmonary;;   BRONCHIAL NEEDLE ASPIRATION BIOPSY  03/22/2020   Procedure: BRONCHIAL NEEDLE ASPIRATION BIOPSIES;  Surgeon: Garner Nash, DO;  Location: [facility];  Service: Pulmonary;;   BRONCHIAL WASHINGS  03/22/2020   Procedure: BRONCHIAL WASHINGS;  Surgeon: Garner Nash, DO;  Location: [facility];  Service: Pulmonary;;   FIDUCIAL MARKER PLACEMENT  03/22/2020   Procedure: FIDUCIAL MARKER PLACEMENT;  Surgeon: Garner Nash, DO;  Location: [facility];  Service: Pulmonary;;   NO PAST SURGERIES     VIDEO BRONCHOSCOPY WITH ENDOBRONCHIAL NAVIGATION N/A 03/22/2020   Procedure: VIDEO BRONCHOSCOPY WITH ENDOBRONCHIAL NAVIGATION;  Surgeon: Garner Nash, DO;  Location: [facility];  Service: Pulmonary;  Laterality: N/A;   VIDEO BRONCHOSCOPY WITH ENDOBRONCHIAL ULTRASOUND N/A 03/22/2020   Procedure: VIDEO BRONCHOSCOPY WITH ENDOBRONCHIAL ULTRASOUND;  Surgeon: Garner Nash, DO;  Location: [facility];  Service: Pulmonary;  Laterality: N/A;    Family History  Problem Relation Age of Onset  Sudden death Mother    Rheumatic fever Mother    Hypertension Neg Hx    Hyperlipidemia Neg Hx    Heart attack Neg Hx    Diabetes Neg Hx    Colon cancer Neg Hx    Stomach cancer Neg Hx     Social History   Socioeconomic History   Marital status: Married    Spouse name: Not on file   Number of children: 1   Years of education: Not on file   Highest education level: Not on file  Occupational History   Occupation: retired  Tobacco Use   Smoking status: Light Smoker    Packs/day: 2.00    Years: 50.00    Pack years: 100.00    Types:  Cigarettes   Smokeless tobacco: Never   Tobacco comments:    07/25/20  Vaping Use   Vaping Use: Never used  Substance and Sexual Activity   Alcohol use: Yes    Alcohol/week: 5.0 standard drinks    Types: 3 Cans of beer, 2 Shots of liquor per week    Comment: daily   Drug use: No   Sexual activity: Yes  Other Topics Concern   Not on file  Social History Narrative   Regular exercise:  2-3 x weekly (sports, yardwork)   Caffeine use:  3 cups coffee daily   76 yr old son   Wife   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           Social Determinants of Health   Financial Resource Strain: Not on file  Food Insecurity: Not on file  Transportation Needs: Not on file  Physical Activity: Not on file  Stress: Not on file  Social Connections: Not on file  Intimate Partner Violence: Not on file    Outpatient Medications Prior to Visit  Medication Sig Dispense Refill   ascorbic acid (VITAMIN C) 500 MG tablet Take 500 mg by mouth daily.     memantine (NAMENDA) 10 MG tablet Take 10 mg by mouth 2 (two) times daily.     Multiple Vitamin (MULTIVITAMIN) tablet Take 1 tablet by mouth daily.     tamsulosin (FLOMAX) 0.4 MG CAPS capsule Take 0.4 mg by mouth daily.     vitamin B-12 (CYANOCOBALAMIN) 1000 MCG tablet Take 1,000 mcg by mouth daily.     buPROPion (WELLBUTRIN XL) 300 MG 24 hr tablet TAKE 1 TABLET BY MOUTH DAILY 90 tablet 0   HYDROcodone-acetaminophen (HYCET) 7.5-325 mg/15 ml solution Take 10 mL ever 3-4 hours prn pain (Patient not taking: No sig reported) 473 mL 0   omeprazole (PRILOSEC) 20 MG capsule Take 1 capsule (20 mg total) by mouth daily. (Patient not taking: No sig reported) 30 capsule 1   umeclidinium-vilanterol (ANORO ELLIPTA) 62.5-25 MCG/INH AEPB Inhale 1 puff into the lungs daily. (Patient not taking: No sig reported) 3 each 2   No facility-administered medications prior to visit.    Allergies  Allergen Reactions    Acyclovir And Related Rash    Rash that looked like chicken pox    ROS    See HPI Objective:    Physical Exam Constitutional:      General: He is not in acute distress.    Appearance: He is well-developed and underweight.  HENT:     Head: Normocephalic and atraumatic.     Right Ear: Tympanic membrane and ear canal normal.     Left Ear: Ear canal  normal.     Ears:     Comments: L TM partially occluded by cerumen Cardiovascular:     Rate and Rhythm: Normal rate and regular rhythm.     Heart sounds: No murmur heard. Pulmonary:     Effort: Pulmonary effort is normal. No respiratory distress.     Breath sounds: Normal breath sounds. No wheezing or rales.  Skin:    General: Skin is warm and dry.  Neurological:     Mental Status: He is alert and oriented to person, place, and time.  Psychiatric:        Behavior: Behavior normal.        Thought Content: Thought content normal.    BP (!) 123/56 (BP Location: Right Arm, Patient Position: Sitting, Cuff Size: Small)   Pulse 78   Temp 98.6 F (37 C) (Oral)   Resp 16   Ht 6\' 1"  (1.854 m)   Wt 132 lb 3.2 oz (60 kg)   SpO2 100%   BMI 17.44 kg/m  Wt Readings from Last 3 Encounters:  11/13/20 132 lb 3.2 oz (60 kg)  10/24/20 128 lb 11.2 oz (58.4 kg)  08/02/20 133 lb (60.3 kg)       Assessment & Plan:   Problem List Items Addressed This Visit       Unprioritized   Weight loss - Primary    Suggested that he also add some dried fruit nuts/nut butters to his diet and consider adding another carnation instant breakfast in the evening.         Relevant Orders   TSH   Tobacco abuse    Discussed cessation.        Small cell carcinoma of right lung [facility])    S/p radiation/chemo- management per oncology.        Hearing loss   Relevant Orders   Ambulatory referral to Audiology   Eustachian tube dysfunction, bilateral   Other Visit Diagnoses     Diarrhea, unspecified type       Relevant Orders   GI Profile, Stool,  PCR       I have discontinued Raelyn Ensign. Guaman's umeclidinium-vilanterol, omeprazole, HYDROcodone-acetaminophen, and buPROPion. I am also having him start on fluticasone. Additionally, I am having him maintain his multivitamin, ascorbic acid, vitamin B-12, tamsulosin, and memantine.  Meds ordered this encounter  Medications   fluticasone (FLONASE) 50 MCG/ACT nasal spray    Sig: Place 2 sprays into both nostrils daily.    Dispense:  16 g    Refill:  6    Order Specific Question:   Supervising Provider    Answer:   Penni Homans A [[id number]]

## 2020-11-13 NOTE — Assessment & Plan Note (Signed)
Discussed cessation 

## 2020-11-16 ENCOUNTER — Other Ambulatory Visit: Payer: Self-pay

## 2020-11-16 ENCOUNTER — Other Ambulatory Visit: Payer: Self-pay | Admitting: Radiation Therapy

## 2020-11-16 ENCOUNTER — Other Ambulatory Visit: Payer: Medicare Other

## 2020-11-16 DIAGNOSIS — C7931 Secondary malignant neoplasm of brain: Secondary | ICD-10-CM

## 2020-11-16 DIAGNOSIS — R197 Diarrhea, unspecified: Secondary | ICD-10-CM

## 2020-11-16 NOTE — Addendum Note (Signed)
Addended by: Manuela Schwartz on: 11/16/2020 11:32 AM   Modules accepted: Orders

## 2020-11-20 LAB — GI PROFILE, STOOL, PCR

## 2020-11-21 ENCOUNTER — Telehealth: Payer: Self-pay | Admitting: Family

## 2020-11-21 NOTE — Telephone Encounter (Signed)
Lvm for patient to call back about his results

## 2020-11-21 NOTE — Telephone Encounter (Signed)
Pt is aware of lab results and is not having diarrhea

## 2020-11-21 NOTE — Telephone Encounter (Signed)
Stool profile is normal. Is he still having diarrhea? If so, I will plan referral to GI.

## 2020-11-28 ENCOUNTER — Other Ambulatory Visit: Payer: Self-pay

## 2020-11-28 ENCOUNTER — Ambulatory Visit: Payer: Medicare Other | Attending: Audiologist | Admitting: Audiologist

## 2020-11-28 DIAGNOSIS — H903 Sensorineural hearing loss, bilateral: Secondary | ICD-10-CM | POA: Insufficient documentation

## 2020-11-28 DIAGNOSIS — H9103 Ototoxic hearing loss, bilateral: Secondary | ICD-10-CM | POA: Insufficient documentation

## 2020-11-28 NOTE — Procedures (Signed)
Outpatient Audiology and [facility], [place]  [id number] [phone]  AUDIOLOGICAL  EVALUATION  NAME: Ian Hart     DOB:   Apr 05, 1951      MRN: [id number]                                                                                     DATE: 11/28/2020     REFERENT: Debbrah Alar, NP STATUS: Outpatient DIAGNOSIS: Sensorineural Hearing Loss, Bilateral     History: Ian Hart was seen for an audiological evaluation.  Ian Hart is receiving a hearing evaluation due to concerns for difficulty hearing people on a daily basis . Ian Hart has difficulty hearing in background noise, crowds, and the TV. He feels people always sound like are mumbling. This difficulty began gradually more than a year ago, but got much worse after he had radiation. Ian Hart had radiation for ten days to the head and chemotherapy treatment. After these treatments he feels his hearing has declined. No pain, tinnitus or pressure reported in either ear.  Ian Hart says on an MRI they did find fluid behind one of his ears and he has been taking over the counter medication. Ian Hart has a diagnosis of eustachian tube dysfunction in his medical records Ian Hart has a history of noise exposure from going ot concerts when younger.  Medical history positive for ototoxic medication exposure which is a risk factor for hearing loss. No other relevant case history reported.    Evaluation:  Otoscopy showed a clear view of the tympanic membranes, bilaterally Tympanometry results were consistent with normal middle ear pressure bilaterally   Audiometric testing was completed using conventional audiometry with insert transducer. Speech Recognition Thresholds were consistent with pure tone averages. Word Recognition was good in the left ear and fair in the right ear at an elevated level. Pure tone thresholds show normal sloping to severe sensorineural hearing loss in both ears. Test results are consistent with age  related change and change due to exposure to ototoxic medications.   Results:  The test results were reviewed with Ian Hart. he  was counseled on the nature and degree of hiss hearing loss. he  was provided with several copies of his audiogram that illustrate his degree of hearing loss in both ears. his hearing loss is in the high frequencies preventing Ian Hart from hearing high frequency consonants such as /s/ /sh/ /f/ /t/ and /th/. These sounds help differentiate the words he  hears. Without these sounds, speech is muffled and unclear unless someone is face to face within 5 feet without a mask. Ian Hart is an excellent hearing aid candidate. With hearing aids the clarity of speech will improve and Ian Hart will not have to work so hard to hear. Ian Hart was provided with a list of audiologists that dispense hearing aids and the number for [employer] in order to obtain information on his hearing aid benefits.     Recommendations:b  Amplification is necessary for both ears. Hearing aids can be purchased from a variety of locations. See provided list for locations in the [place].  Call the number provided to check which locations can take the [employer].  Ian Hart  Audiologist, Au.D., CCC-A 11/28/2020  10:09 AM  Cc: Debbrah Alar, NP

## 2020-12-06 ENCOUNTER — Other Ambulatory Visit: Payer: Self-pay

## 2020-12-06 ENCOUNTER — Ambulatory Visit (HOSPITAL_COMMUNITY)
Admission: RE | Admit: 2020-12-06 | Discharge: 2020-12-06 | Disposition: A | Discharge status: Home / Self Care | Payer: Medicare Other | Source: Ambulatory Visit | Attending: Radiation Oncology | Admitting: Radiation Oncology

## 2020-12-06 DIAGNOSIS — C7931 Secondary malignant neoplasm of brain: Secondary | ICD-10-CM | POA: Insufficient documentation

## 2020-12-06 DIAGNOSIS — C719 Malignant neoplasm of brain, unspecified: Secondary | ICD-10-CM | POA: Diagnosis not present

## 2020-12-06 MED ORDER — GADOBUTROL 1 MMOL/ML IV SOLN
6.0000 mL | Freq: Once | INTRAVENOUS | Status: AC | PRN
  Administered 2020-12-06: 6 mL via INTRAVENOUS

## 2020-12-07 ENCOUNTER — Encounter: Payer: Self-pay | Admitting: Radiation Oncology

## 2020-12-07 NOTE — Progress Notes (Signed)
Patient reports no pain, swallowing issues, choking, or weight loss and has a good appetite. Patient expressed mild fatigue, cough and shortness of breath (nightly).  Meaningful use questions complete and patient notified of 9:30am telephone appointment on 12/11/20 and expressed understanding.

## 2020-12-11 ENCOUNTER — Ambulatory Visit
Admission: RE | Admit: 2020-12-11 | Discharge: 2020-12-11 | Disposition: A | Discharge status: Home / Self Care | Payer: Medicare Other | Source: Ambulatory Visit | Attending: Radiation Oncology | Admitting: Radiation Oncology

## 2020-12-11 ENCOUNTER — Telehealth: Payer: Self-pay | Admitting: *Deleted

## 2020-12-11 DIAGNOSIS — Z298 Encounter for other specified prophylactic measures: Secondary | ICD-10-CM | POA: Diagnosis not present

## 2020-12-11 DIAGNOSIS — Z08 Encounter for follow-up examination after completed treatment for malignant neoplasm: Secondary | ICD-10-CM | POA: Diagnosis not present

## 2020-12-11 DIAGNOSIS — C3491 Malignant neoplasm of unspecified part of right bronchus or lung: Secondary | ICD-10-CM

## 2020-12-11 DIAGNOSIS — C3411 Malignant neoplasm of upper lobe, right bronchus or lung: Secondary | ICD-10-CM | POA: Diagnosis not present

## 2020-12-11 NOTE — Telephone Encounter (Signed)
CALLED PATIENT TO INFORM OF NUTRITION  TELEPHONE CALL ON 12-25-20@ 9:45 AM WITH JESSICA CLAYTON, SPOKE WITH PATIENT AND HE IS AWARE OF THIS APPT.

## 2020-12-11 NOTE — Progress Notes (Signed)
Radiation Oncology         ([phone]) [phone] ________________________________   Outpatient Follow Up - Conducted via telephone due to current COVID-19 concerns for limiting patient exposure  I spoke with the patient to conduct this consult visit via telephone to spare the patient unnecessary potential exposure in the healthcare setting during the current COVID-19 pandemic. The patient was notified in advance and was offered a [place] meeting to allow for face to face communication but unfortunately reported that they did not have the appropriate resources/technology to support such a visit and instead preferred to proceed with a telephone visit.    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   HISTORY OF PRESENT ILLNESS: Ian Hart is a 70 y.o. male with a diagnosis of lung cancer.  The patient was referred in November 2021 to Dr. Valeta Harms in pulmonary medicine for an abnormality on screening CT scan.  The patient had the scan on 02/15/2020 revealing a spiculated 13.6 mm nodule in the right upper lobe, and he subsequently underwent a PET scan on 03/01/2020 which revealed hypermetabolism within the right upper lobe nodule as well as an isolated hypermetabolic subcarinal lymph nodes suspicious for nodal disease.  No other extrathoracic hypermetabolic metastases were identified, a left renal lesion which was indeterminate but hypermetabolic was also seen and possibly suspicious for renal cell carcinoma.  He underwent a CT super D scan on 03/22/2020 and subsequent bronchoscopy.  Interestingly bronchoscopy confirms adenocarcinoma with in the lung nodule and small cell carcinoma within the 7 station lymph node.  He proceeded with chemoRT between 04/17/20 and 06/01/20. He also proceeded with prophylactic  cranial irradiation to reduce risks of brain disease. He also began Namenda to reduce risks of cognitive deficits from radiation.  His post treatment MRI on 12/06/20 showed no evidence of metastatic disease but did have fluid in bilateral mastoids. He continues on Namenda and is contacted today by phone.  PREVIOUS RADIATION THERAPY:   08/20/2020 through 08/31/2020 Site Technique Total Dose (Gy) Dose per Fx (Gy) Completed Fx Beam Energies  Brain: Brain Complex 25/25 2.5 10/10 6X     Radiation Treatment Dates: 04/17/2020 through 06/01/2020 Site Technique Total Dose (Gy) Dose per Fx (Gy) Completed Fx Beam Energies  Lung, Right: Lung_Rt 3D 60/60 2 30/30 6X, 10X  Lung, Right: Lung_Rt_Bst 3D 6/6 2 3/3 6X, 10X     PAST MEDICAL HISTORY:  Past Medical History:  Diagnosis Date   History of chicken pox    History of shingles 04/2010   Lung cancer ([place]) 02/2020       PAST SURGICAL HISTORY: Past Surgical History:  Procedure Laterality Date   BRONCHIAL BIOPSY  03/22/2020   Procedure: BRONCHIAL BIOPSIES;  Surgeon: Garner Nash, DO;  Location: [place] [facility];  Service: Pulmonary;;   BRONCHIAL BRUSHINGS  03/22/2020   Procedure: BRONCHIAL BRUSHINGS;  Surgeon: Garner Nash, DO;  Location: [place] [facility];  Service: Pulmonary;;   BRONCHIAL NEEDLE ASPIRATION BIOPSY  03/22/2020   Procedure: BRONCHIAL NEEDLE ASPIRATION BIOPSIES;  Surgeon: Garner Nash, DO;  Location: [place];  Service: Pulmonary;;   BRONCHIAL WASHINGS  03/22/2020   Procedure: BRONCHIAL WASHINGS;  Surgeon: Garner Nash, DO;  Location: [place];  Service: Pulmonary;;   FIDUCIAL MARKER PLACEMENT  03/22/2020  Procedure: FIDUCIAL MARKER PLACEMENT;  Surgeon: Garner Nash, DO;  Location: [place] [facility];  Service: Pulmonary;;   NO PAST SURGERIES     VIDEO BRONCHOSCOPY WITH ENDOBRONCHIAL NAVIGATION N/A 03/22/2020   Procedure: VIDEO BRONCHOSCOPY WITH ENDOBRONCHIAL NAVIGATION;  Surgeon: Garner Nash, DO;  Location: [place];  Service: Pulmonary;  Laterality: N/A;   VIDEO BRONCHOSCOPY WITH ENDOBRONCHIAL ULTRASOUND N/A 03/22/2020   Procedure: VIDEO BRONCHOSCOPY WITH ENDOBRONCHIAL ULTRASOUND;  Surgeon: Garner Nash, DO;  Location: [place];  Service: Pulmonary;  Laterality: N/A;     FAMILY HISTORY:  Family History  Problem Relation Age of Onset   Sudden death Mother    Rheumatic fever Mother    Hypertension Neg Hx    Hyperlipidemia Neg Hx    Heart attack Neg Hx    Diabetes Neg Hx    Colon cancer Neg Hx    Stomach cancer Neg Hx      SOCIAL HISTORY:  reports that he has been smoking cigarettes. He has a 100.00 pack-year smoking history. He has never used smokeless tobacco. He reports current alcohol use of about 5.0 standard drinks per week. He reports that he does not use drugs. The patient is married and lives in [place].    ALLERGIES: Acyclovir and related   MEDICATIONS:  Current Outpatient Medications  Medication Sig Dispense Refill   ascorbic acid (VITAMIN C) 500 MG tablet Take 500 mg by mouth daily.     fluticasone (FLONASE) 50 MCG/ACT nasal spray Place 2 sprays into both nostrils daily. 16 g 6   memantine (NAMENDA) 10 MG tablet Take 10 mg by mouth 2 (two) times daily.     Multiple Vitamin (MULTIVITAMIN) tablet Take 1 tablet by mouth daily.     tamsulosin (FLOMAX) 0.4 MG CAPS capsule Take 0.4 mg by mouth daily.     vitamin B-12 (CYANOCOBALAMIN) 1000 MCG tablet Take 1,000 mcg by mouth daily.     No current facility-administered medications for this encounter.     REVIEW OF SYSTEMS: On review of systems, the patient reports that he is doing well overall. His breathing is stable. He is having a hard time hearing and was seen by audiology and they think he needs hearing aids. We discussed the possibility of this being related to radiation as well. He is still struggling with trying to gain weight and would like to meet with nutrition. He reports some shortness of breath and  fatigue as well as occasional cough but no progressive symptoms. No headaches, visual or movement difficulties are noted. No other complaints verbalized.      PHYSICAL EXAM:  Unable to assess due to encounter type.  ECOG = 0  0 - Asymptomatic (Fully active, able to carry on all predisease activities without restriction)  1 - Symptomatic but completely ambulatory (Restricted in physically strenuous activity but ambulatory and able to carry out work of a light or sedentary nature. For example, light housework, office work)  2 - Symptomatic, <50% in bed during the day (Ambulatory and capable of all self care but unable to carry out any work activities. Up and about more than 50% of waking hours)  3 - Symptomatic, >50% in bed, but not bedbound (Capable of only limited self-care, confined to bed or chair 50% or more of waking hours)  4 - Bedbound (Completely disabled. Cannot carry on any self-care. Totally confined to bed or chair)  5 - Death   Eustace Pen MM, Creech RH, Tormey DC, et al. [phone]). "Toxicity  and response criteria of the [facility]". [place] Oncol. 5 (6): 649-55    LABORATORY DATA:  Lab Results  Component Value Date   WBC 5.0 10/24/2020   HGB 12.5 (L) 10/24/2020   HCT 36.4 (L) 10/24/2020   MCV 96.3 10/24/2020   PLT 157 10/24/2020   Lab Results  Component Value Date   NA 133 (L) 10/24/2020   K 4.6 10/24/2020   CL 101 10/24/2020   CO2 25 10/24/2020   Lab Results  Component Value Date   ALT 15 10/24/2020   AST 18 10/24/2020   ALKPHOS 71 10/24/2020   BILITOT 0.4 10/24/2020      RADIOGRAPHY: MR Brain W Wo Contrast  Result Date: 12/07/2020 CLINICAL DATA:  Neoplasm surveillance EXAM: MRI HEAD WITHOUT AND WITH CONTRAST TECHNIQUE: Multiplanar, multiecho pulse sequences of the brain and surrounding structures were obtained without and with intravenous contrast. CONTRAST:  70mL GADAVIST GADOBUTROL 1 MMOL/ML IV SOLN COMPARISON:  08/11/2020 in  FINDINGS: Brain: No acute infarction, hemorrhage, hydrocephalus, extra-axial collection, or mass lesion. The ventricles and sulci are normal for age. No abnormal enhancement. Vascular: Normal flow voids. Skull and upper cervical spine: Normal marrow signal. Sinuses/Orbits: Negative. Other: Fluid in the bilateral mastoid air cells. IMPRESSION: No acute intracranial process. No evidence of intracranial metastatic disease. Electronically Signed   By: Merilyn Baba M.D.   On: 12/07/2020 15:58        IMPRESSION/PLAN: 1. 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 The patient has done well since completing PCI to the brain. He continues in surveillance with Dr. Julien Nordmann. We discussed the results of his recent MRI which again are clear of disease. He continues Namenda without untoward side effects. He does have some fluid in the mastoids and we discussed using OTC decongestants and if this does not work, would offer a steroid dose pack. He will keep [name] informed. Otherwise we will plan to repeat MRI in 3-4 months and he will contact us if he has questions or concerns prior to that visit. 2. Left renal lesion. He is proceeding with partial nephrectomy at [facility] at the end of September 2022. We will follow this expectantly. 3. Difficulty gaining weight. We will reach out to nutrition to meet with this patient. He is in agreement with this plan.  Given current concerns for patient exposure during the COVID-19 pandemic, this encounter was conducted via telephone.  The patient has provided two factor identification and has given verbal consent for this type of encounter and has been advised to only accept a meeting of this type in a secure network environment. The time spent during this encounter was 25 minutes including preparation, discussion, and coordination of the patient's care. The attendants for this meeting include Hayden Pedro   and Chad Cordial.  During the encounter, Hayden Pedro was located at [facility] Radiation Oncology Department.  TITAN KARNER was located at home.    The above documentation reflects my direct findings during this shared patient visit. Please see the separate note by Dr. Lisbeth Renshaw on this date for the remainder of the patient's plan of care.    Carola Rhine, PAC

## 2020-12-12 DIAGNOSIS — H5203 Hypermetropia, bilateral: Secondary | ICD-10-CM | POA: Diagnosis not present

## 2020-12-12 DIAGNOSIS — H353131 Nonexudative age-related macular degeneration, bilateral, early dry stage: Secondary | ICD-10-CM | POA: Diagnosis not present

## 2020-12-14 ENCOUNTER — Other Ambulatory Visit: Payer: Self-pay | Admitting: Radiation Therapy

## 2020-12-14 DIAGNOSIS — C7949 Secondary malignant neoplasm of other parts of nervous system: Secondary | ICD-10-CM

## 2020-12-25 ENCOUNTER — Other Ambulatory Visit: Payer: Self-pay

## 2020-12-25 ENCOUNTER — Inpatient Hospital Stay: Payer: Medicare Other | Attending: Internal Medicine | Admitting: Dietician

## 2020-12-25 NOTE — Progress Notes (Signed)
Nutrition Follow-up:  Patient has completed concurrent chemoradiation therapy for small cell lung cancer in 07/2020. Patient is scheduled 9/28 for partial nephrectomy at [facility] for left renal lesion.   Met with patient in clinic today. Patient reports having a good appetite and has increased oral intake but weights are not recovering as fast as he thought they would. He denies nausea, vomiting, says he has 2 loose BM's daily. This is normal for him. Patient reports eating 3 meals daily as well as CIB with whey protein powder milkshake daily. Patient reports waking up at 5 AM and drinks a couple cups of coffee, typically has bowl of cherrios with whole milk around 8 AM. He eats leftovers or sandwich for lunch at 1, yesterday he had left over roast beef and mashed potatoes. Patient eats dinner around 7, recalls meat, starch, vegetable or some type of pasta dish. He drinks milk or water with dinner, says he drinks water or a beer with lunch. Patient reports sometimes waking up during the night and drinking 8-10 oz glass of whole milk. Patient reports he enjoys working in the yard, but has noticed he tires quickly.    Medications: reviewed  Labs: reviewed  Anthropometrics: Patient reports 132 lb on home scale this morning. Last weight 132 lb 3.2 oz on 8/2 increased from 128 lb 11.2 oz on 7/13  4/21 - 133 lb  3/28 - 129 lb 9.6 oz   NUTRITION DIAGNOSIS: Food and nutrition related knowledge deficit improving   INTERVENTION:  Educated on continued increased calorie/protein needs to support post treatment healing and expect slow weight gain/weight maintenance  Continue to weigh at home weekly Discussed high calorie, high protein snacks in between meals - handout with recipes provided Discussed adding Ensure Complete/equivalent daily for added calories and protein, sample provided for pt to try and coupons given Encouraged having bedtime snack Encouraged activity as able  Contact information provided      MONITORING, EVALUATION, GOAL: weight trends, intake   NEXT VISIT: No follow-up scheduled at this time. Patient encouraged to contact with further questions or concerns

## 2021-01-23 DIAGNOSIS — Z01810 Encounter for preprocedural cardiovascular examination: Secondary | ICD-10-CM | POA: Diagnosis not present

## 2021-01-23 DIAGNOSIS — N2889 Other specified disorders of kidney and ureter: Secondary | ICD-10-CM | POA: Diagnosis not present

## 2021-01-23 DIAGNOSIS — C642 Malignant neoplasm of left kidney, except renal pelvis: Secondary | ICD-10-CM | POA: Diagnosis not present

## 2021-01-23 DIAGNOSIS — Z79899 Other long term (current) drug therapy: Secondary | ICD-10-CM | POA: Diagnosis not present

## 2021-01-23 DIAGNOSIS — Z87891 Personal history of nicotine dependence: Secondary | ICD-10-CM | POA: Diagnosis not present

## 2021-01-25 ENCOUNTER — Inpatient Hospital Stay: Payer: Medicare Other

## 2021-01-25 ENCOUNTER — Ambulatory Visit (HOSPITAL_COMMUNITY): Admission: RE | Admit: 2021-01-25 | Payer: Medicare Other | Source: Ambulatory Visit

## 2021-01-28 ENCOUNTER — Ambulatory Visit: Payer: Medicare Other | Admitting: Internal Medicine

## 2021-01-28 DIAGNOSIS — Z79899 Other long term (current) drug therapy: Secondary | ICD-10-CM | POA: Diagnosis not present

## 2021-01-28 DIAGNOSIS — Z87891 Personal history of nicotine dependence: Secondary | ICD-10-CM | POA: Diagnosis not present

## 2021-01-28 DIAGNOSIS — C642 Malignant neoplasm of left kidney, except renal pelvis: Secondary | ICD-10-CM | POA: Diagnosis not present

## 2021-01-28 DIAGNOSIS — N2889 Other specified disorders of kidney and ureter: Secondary | ICD-10-CM | POA: Diagnosis not present

## 2021-01-28 NOTE — Progress Notes (Signed)
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 3) Suspicious left renal lesion  PRIOR THERAPY: 1) Systemic chemotherapy/radiation with cisplatin 80 mg per metered squared on day 1 and etoposide 100 mg per metered squared on days 1, 2, and 3 IV every 3 weeks.  Status post 4 cycles.  Starting from cycle #3, cisplatin was changed to carboplatin for an AUC of 5 due to renal insufficiency. First dose on 04/20/20. Onpro was added to the treatment plan starting from cycle #3 due to pancytopenia.  2) PCI under the care of Dr. Lisbeth Renshaw. Completed on 08/31/20 3) Robotic nephrectomy on 01/28/21 under the care of Dr. Brendia Sacks at [facility]: Ian Hart 70 y.o. male returns to the clinic today for a follow up visit accompanied by his wife. The patient is feeling fairly well today. In the interval since his last appointment, he underwent robotic partial nephrectomy under Dr. Brendia Sacks. He tolerated this procedure well.    Denies any fever, chills, night sweats, or weight loss. He has been following with nutrition as he lost weight after his radiation. He is now snacking more and drinking ensure once a day. He noted some hearing changes after completing radiation and saw an audiologist who recommended hearing aids. He also recently saw his eye doctor. Denies any chest pain, cough, or hemoptysis. He has some stable/improving dyspnea on exertion. He developed some anemia with his recent surgery. He is taking a multivitamin. Denies any nausea and vomiting. He had some diarrhea following his surgery which improved at this time. His last bowel  movement was last night. Denies any headache or visual changes. Denies any rashes or skin changes. The patient recently had a restaging CT scan. The patient is here today for evaluation and to review his scan results.    MEDICAL HISTORY: Past Medical History:  Diagnosis Date   History of chicken pox    History of shingles 04/2010   Lung cancer ([place]) 02/2020    ALLERGIES:  is allergic to acyclovir and related.  MEDICATIONS:  Current Outpatient Medications  Medication Sig Dispense Refill   ascorbic acid (VITAMIN C) 500 MG tablet Take 500 mg by mouth daily.     fluticasone (FLONASE) 50 MCG/ACT nasal spray Place 2 sprays into both nostrils daily. 16 g 6   memantine (NAMENDA) 10 MG tablet Take 10 mg by mouth 2 (two) times daily.     Multiple Vitamin (MULTIVITAMIN) tablet Take 1 tablet by mouth daily.     tamsulosin (FLOMAX) 0.4 MG CAPS capsule Take 0.4 mg by mouth daily.     vitamin B-12 (CYANOCOBALAMIN) 1000 MCG tablet Take 1,000 mcg by mouth daily.     No current facility-administered medications for this visit.    SURGICAL HISTORY:  Past Surgical History:  Procedure Laterality Date   BRONCHIAL BIOPSY  03/22/2020   Procedure: BRONCHIAL BIOPSIES;  Surgeon: Garner Nash, DO;  Location: [facility];  Service: Pulmonary;;   BRONCHIAL BRUSHINGS  03/22/2020   Procedure: BRONCHIAL BRUSHINGS;  Surgeon: Garner Nash, DO;  Location: [facility];  Service: Pulmonary;;   BRONCHIAL NEEDLE ASPIRATION BIOPSY  03/22/2020   Procedure:  BRONCHIAL NEEDLE ASPIRATION BIOPSIES;  Surgeon: Garner Nash, DO;  Location: [facility];  Service: Pulmonary;;   BRONCHIAL WASHINGS  03/22/2020   Procedure: BRONCHIAL WASHINGS;  Surgeon: Garner Nash, DO;  Location: [facility];  Service: Pulmonary;;   FIDUCIAL MARKER PLACEMENT  03/22/2020   Procedure: FIDUCIAL MARKER PLACEMENT;  Surgeon: Garner Nash, DO;  Location: [facility];  Service: Pulmonary;;   NO PAST SURGERIES     VIDEO BRONCHOSCOPY  WITH ENDOBRONCHIAL NAVIGATION N/A 03/22/2020   Procedure: VIDEO BRONCHOSCOPY WITH ENDOBRONCHIAL NAVIGATION;  Surgeon: Garner Nash, DO;  Location: [facility];  Service: Pulmonary;  Laterality: N/A;   VIDEO BRONCHOSCOPY WITH ENDOBRONCHIAL ULTRASOUND N/A 03/22/2020   Procedure: VIDEO BRONCHOSCOPY WITH ENDOBRONCHIAL ULTRASOUND;  Surgeon: Garner Nash, DO;  Location: [facility];  Service: Pulmonary;  Laterality: N/A;   Review of Systems  Constitutional: Positive for decreased appetite. Negative for chills and fever. HENT:  Negative for mouth sores or nosebleeds. Eyes: Negative for eye problems and icterus.  Respiratory: Positive for shortness of breath with exertion. Negative for hemoptysis, cough, and wheezing.   Cardiovascular: Negative for chest pain and leg swelling.  Gastrointestinal: Negative for abdominal pain, constipation, diarrhea, nausea and vomiting.  Genitourinary: Negative for bladder incontinence, difficulty urinating, dysuria, frequency and hematuria.   Musculoskeletal: Negative for back pain, gait problem, neck pain and neck stiffness.  Skin: Negative for itching and rash.  Neurological: Negative for dizziness, extremity weakness, gait problem, headaches, light-headedness and seizures.  Hematological: Negative for adenopathy. Does not bruise/bleed easily.  Psychiatric/Behavioral: Negative for confusion, depression and sleep disturbance. The patient is not nervous/anxious.   Physical Exam  Constitutional: Oriented to person, place, and time and thin appearing male and in no distress. HENT:  Head: Normocephalic and atraumatic.  Mouth/Throat: Oropharynx is clear and moist. No oropharyngeal exudate.  Eyes: Conjunctivae are normal. Right eye exhibits no discharge. Left eye exhibits no discharge. No scleral icterus.  Neck: Normal range of motion. Neck supple.  Cardiovascular: Normal rate, regular rhythm, normal heart sounds and intact distal pulses.   Pulmonary/Chest:  Effort normal and breath sounds normal. No respiratory distress. No wheezes. No rales.  Abdominal: Soft. Bowel sounds are normal. Exhibits no distension and no mass. There is no tenderness.  Musculoskeletal: Normal range of motion. Exhibits no edema. Scoliosis noted.  Lymphadenopathy:    No cervical adenopathy.  Neurological: Alert and oriented to person, place, and time. Exhibits normal muscle wasting. Gait normal. Coordination normal.  Skin: Skin is warm and dry. No rash noted. Not diaphoretic. No erythema. No pallor.  Psychiatric: Mood, memory and judgment normal.  Vitals reviewed.  Blood pressure 132/62, pulse 81, temperature 97.8 F (36.6 C), resp. rate 18, weight 134 lb 7 oz (61 kg), SpO2 100 %.  LABORATORY DATA: Lab Results  Component Value Date   WBC 5.4 02/01/2021   HGB 9.9 (L) 02/01/2021   HCT 29.1 (L) 02/01/2021   MCV 94.8 02/01/2021   PLT 181 02/01/2021      Chemistry      Component Value Date/Time   NA 130 (L) 02/01/2021 0758   K 4.7 02/01/2021 0758   CL 95 (L) 02/01/2021 0758   CO2 28 02/01/2021 0758   BUN 21 02/01/2021 0758   CREATININE 1.05 02/01/2021 0758   CREATININE 0.86 11/12/2011 0917      Component Value Date/Time   CALCIUM 9.2 02/01/2021 0758   ALKPHOS 56 02/01/2021 0758   AST 17 02/01/2021 0758   ALT 16 02/01/2021 0758  BILITOT 0.4 02/01/2021 0758       RADIOGRAPHIC STUDIES: CT Chest W Contrast  Addendum Date: 02/01/2021   ADDENDUM REPORT: 02/01/2021 12:20 ADDENDUM: These results were called by telephone at the time of interpretation on 02/01/2021 at 12:19 pm to provider Dr. Verlene Mayer of [facility], who verbally acknowledged these results. Electronically Signed   By: Zetta Bills M.D.   On: 02/01/2021 12:20   Result Date: 02/01/2021 CLINICAL DATA:  Primary Cancer Type: Lung Imaging Indication: Routine surveillance Interval therapy since last imaging? 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 Primary Tumor location: Right upper lobe. Surgeries: No thoracic.  Partial left nephrectomy 12/2020. Chemotherapy: Yes; Ongoing? No; Most recent administration: 06/25/2020 Immunotherapy? No Radiation therapy? Yes Date Range: 08/20/2020 - 08/31/2020; Target: Brain Date Range: 04/17/2020 - 06/01/2020; Target: Right lung EXAM: CT CHEST WITH CONTRAST TECHNIQUE: Multidetector CT imaging of the chest was performed during intravenous contrast administration. CONTRAST:  37mL OMNIPAQUE IOHEXOL 350 MG/ML SOLN COMPARISON:  Most recent CT chest 10/22/2020.  03/01/2020 PET-CT. FINDINGS: Cardiovascular: Calcified aortic atherosclerosis. No aneurysmal dilation. Heart size normal without substantial pericardial effusion. Signs of calcified coronary artery disease. Central pulmonary vasculature is unremarkable. Mediastinum/Nodes: No adenopathy in the chest. Lungs/Pleura: Signs of pulmonary emphysema as before. Spiculated nodule with surrounding septal thickening and mild ground-glass measures 10 x 6 mm as compared to 15 x 6-7 mm on the prior exam no new pulmonary nodule. No sign of effusion or consolidative process. Airways are patent. Upper Abdomen: Large volume of pneumoperitoneum. Partially visualized changes of LEFT partial nephrectomy. Still some mild contour bulging along the superior margin of the nephrectomy site measuring 1.5 x 1.4 cm potentially small hematoma. Small amount of gas tracking in the retroperitoneum adjacent to this area. No acute findings relative to liver, gallbladder, pancreas, spleen, adrenal glands or RIGHT kidney. Visualized gastrointestinal tract is unremarkable. Musculoskeletal: Subcutaneous emphysema along the anterior and lateral abdominal wall and tracking towards the RIGHT axilla. IMPRESSION: Continued decrease in size of RIGHT  upper lobe pulmonary nodule. Surrounding post treatment changes with similar appearance. Interval LEFT partial nephrectomy partially visualized on the current study, associated with moderately large volume of free air 4-5 days postop and with subcutaneous emphysema along the body wall. While still potentially related to postoperative change would suggest correlation with any worsening symptoms that could indicate complication such as perforated hollow viscus given that surgery occurred 4-5 days ago. No signs of metastatic disease in the chest. These results will be called to the patient's urologist or representative by the Radiologist Assistant, and communication documented in the PACS or [employer]. Aortic Atherosclerosis (ICD10-I70.0) and Emphysema (ICD10-J43.9). Electronically Signed: By: Zetta Bills M.D. On: 02/01/2021 11:45    ASSESSMENT AND PLAN:  This is a very pleasant 70 year old Caucasian male diagnosed with poorly differentiated carcinoma consistent with adenocarcinoma in the right upper lobe.  In addition to small cell lung cancer in the station 7 lymph node diagnosed in December 2021.  The patient also has a suspicious left renal lesion concerning for renal cell carcinoma.  He is status post a course of systemic chemotherapy initially with cisplatin and etoposide but the cisplatin was discontinued secondary to renal insufficiency and the patient continued 3 more cycles of his systemic chemotherapy with carboplatin and etoposide concurrent with radiation.  He has a rough time with  the treatment including pancytopenia as well as fatigue and weight loss.  He completed PCI under the care of Dr. Lisbeth Renshaw 08/31/20.   The patient had a robotic partial nephrectomy under the care of Dr. Brendia Sacks on 01/28/21.   The patient recently had a restaging CT scan performed. Dr. Julien Nordmann personally and independently reviewed the scan and discussed the results with the patient. The scan showed no evidence of  disease progression regarding the cancer.  Patient spoke to his urologist on Friday regarding the other findings on the scan free air secondary to his recent surgery.  The patient is not having any abdominal concerns.  Dr. Julien Nordmann recommends that he continue on observation with a restaging CT scan on 4 months.  If the scan continues to be stable at the time, Dr. Julien Nordmann may recommend scanning the patient every 6 months.  The patient has some anemia on his labs likely secondary to his recent surgery.  His admission hemoglobin was 12.  We will continue taking a multivitamin.  Will continue to follow with cardiology regarding his decreased hearing and will get hearing aids.  Continue drinking Ensure once per day.  The patient was advised to call immediately if he has any concerning symptoms in the interval. The patient voices understanding of current disease status and treatment options and is in agreement with the current care plan. All questions were answered. The patient knows to call the clinic with any problems, questions or concerns. We can certainly see the patient much sooner if necessary   Disclaimer: This note was dictated with voice recognition software. Similar sounding words can inadvertently be transcribed and may not be corrected upon review.  ADDENDUM: Hematology/Oncology Attending: I had a face-to-face encounter with the patient today.  I reviewed his record, lab, scan and recommended his care plan.  This is a very pleasant 70 years old white male diagnosed with poorly differentiated carcinoma consistent with adenocarcinoma in the right upper lobe in addition to small cell lung cancer and station 7 lymph node in December 2021 and suspicious left renal lesion concerning for renal cell carcinoma.  The patient underwent systemic chemotherapy with cisplatin and etoposide initially for 1 cycle followed by carboplatin and etoposide for 3 more cycles with significant improvement of his  disease.  He also completed prophylactic cranial irradiation in May 2022. The patient recently underwent robotic partial nephrectomy under the care of Dr. Brendia Sacks at [facility]. He is currently on observation and repeat CT scan of the chest performed recently showed no concerning findings for disease recurrence or metastasis.  The scan showed postoperative changes after his nephrectomy and the report was called to his urologist.  These are normal findings after recent surgery. I discussed the scan results with the patient and his wife and recommended for him to continue on observation with repeat CT scan of the chest in 4 months. He was advised to call immediately if he has any other concerning symptoms in the interval. The total time spent in the appointment was 20 minutes.  Disclaimer: This note was dictated with voice recognition software. Similar sounding words can inadvertently be transcribed and may be missed upon review. Eilleen Kempf, MD 02/04/21

## 2021-02-01 ENCOUNTER — Ambulatory Visit (HOSPITAL_COMMUNITY)
Admission: RE | Admit: 2021-02-01 | Discharge: 2021-02-01 | Disposition: A | Discharge status: Home / Self Care | Payer: Medicare Other | Source: Ambulatory Visit | Attending: Internal Medicine | Admitting: Internal Medicine

## 2021-02-01 ENCOUNTER — Other Ambulatory Visit: Payer: Self-pay

## 2021-02-01 ENCOUNTER — Inpatient Hospital Stay: Payer: Medicare Other | Attending: Internal Medicine

## 2021-02-01 ENCOUNTER — Encounter (HOSPITAL_COMMUNITY): Payer: Self-pay

## 2021-02-01 DIAGNOSIS — D649 Anemia, unspecified: Secondary | ICD-10-CM | POA: Insufficient documentation

## 2021-02-01 DIAGNOSIS — R911 Solitary pulmonary nodule: Secondary | ICD-10-CM | POA: Diagnosis not present

## 2021-02-01 DIAGNOSIS — J439 Emphysema, unspecified: Secondary | ICD-10-CM | POA: Diagnosis not present

## 2021-02-01 DIAGNOSIS — C3490 Malignant neoplasm of unspecified part of unspecified bronchus or lung: Secondary | ICD-10-CM | POA: Insufficient documentation

## 2021-02-01 DIAGNOSIS — Z905 Acquired absence of kidney: Secondary | ICD-10-CM | POA: Insufficient documentation

## 2021-02-01 DIAGNOSIS — C3491 Malignant neoplasm of unspecified part of right bronchus or lung: Secondary | ICD-10-CM

## 2021-02-01 DIAGNOSIS — C3411 Malignant neoplasm of upper lobe, right bronchus or lung: Secondary | ICD-10-CM | POA: Insufficient documentation

## 2021-02-01 DIAGNOSIS — I700 Atherosclerosis of aorta: Secondary | ICD-10-CM | POA: Diagnosis not present

## 2021-02-01 LAB — CBC WITH DIFFERENTIAL (CANCER CENTER ONLY)
Abs Immature Granulocytes: 0.02 10*3/uL (ref 0.00–0.07)
Basophils Absolute: 0 10*3/uL (ref 0.0–0.1)
Basophils Relative: 0 %
Eosinophils Absolute: 0.1 10*3/uL (ref 0.0–0.5)
Eosinophils Relative: 2 %
HCT: 29.1 % — ABNORMAL LOW (ref 39.0–52.0)
Hemoglobin: 9.9 g/dL — ABNORMAL LOW (ref 13.0–17.0)
Immature Granulocytes: 0 %
Lymphocytes Relative: 8 %
Lymphs Abs: 0.4 10*3/uL — ABNORMAL LOW (ref 0.7–4.0)
MCH: 32.2 pg (ref 26.0–34.0)
MCHC: 34 g/dL (ref 30.0–36.0)
MCV: 94.8 fL (ref 80.0–100.0)
Monocytes Absolute: 0.4 10*3/uL (ref 0.1–1.0)
Monocytes Relative: 8 %
Neutro Abs: 4.4 10*3/uL (ref 1.7–7.7)
Neutrophils Relative %: 82 %
Platelet Count: 181 10*3/uL (ref 150–400)
RBC: 3.07 MIL/uL — ABNORMAL LOW (ref 4.22–5.81)
RDW: 11.9 % (ref 11.5–15.5)
WBC Count: 5.4 10*3/uL (ref 4.0–10.5)
nRBC: 0 % (ref 0.0–0.2)

## 2021-02-01 LAB — CMP (CANCER CENTER ONLY)
ALT: 16 U/L (ref 0–44)
AST: 17 U/L (ref 15–41)
Albumin: 4 g/dL (ref 3.5–5.0)
Alkaline Phosphatase: 56 U/L (ref 38–126)
Anion gap: 7 (ref 5–15)
BUN: 21 mg/dL (ref 8–23)
CO2: 28 mmol/L (ref 22–32)
Calcium: 9.2 mg/dL (ref 8.9–10.3)
Chloride: 95 mmol/L — ABNORMAL LOW (ref 98–111)
Creatinine: 1.05 mg/dL (ref 0.61–1.24)
GFR, Estimated: >60 mL/min (ref >60)
Glucose, Bld: 110 mg/dL — ABNORMAL HIGH (ref 70–99)
Potassium: 4.7 mmol/L (ref 3.5–5.1)
Sodium: 130 mmol/L — ABNORMAL LOW (ref 135–145)
Total Bilirubin: 0.4 mg/dL (ref 0.3–1.2)
Total Protein: 7.2 g/dL (ref 6.5–8.1)

## 2021-02-01 MED ORDER — IOHEXOL 350 MG/ML SOLN
60.0000 mL | Freq: Once | INTRAVENOUS | Status: AC | PRN
  Administered 2021-02-01: 60 mL via INTRAVENOUS

## 2021-02-04 ENCOUNTER — Other Ambulatory Visit: Payer: Self-pay

## 2021-02-04 ENCOUNTER — Inpatient Hospital Stay (HOSPITAL_BASED_OUTPATIENT_CLINIC_OR_DEPARTMENT_OTHER): Payer: Medicare Other | Admitting: Physician Assistant

## 2021-02-04 VITALS — BP 132/62 | HR 81 | Temp 97.8°F | Resp 18 | Wt 134.4 lb

## 2021-02-04 DIAGNOSIS — C3411 Malignant neoplasm of upper lobe, right bronchus or lung: Secondary | ICD-10-CM | POA: Insufficient documentation

## 2021-02-04 DIAGNOSIS — D649 Anemia, unspecified: Secondary | ICD-10-CM | POA: Diagnosis not present

## 2021-02-04 DIAGNOSIS — Z905 Acquired absence of kidney: Secondary | ICD-10-CM | POA: Insufficient documentation

## 2021-02-04 DIAGNOSIS — C3491 Malignant neoplasm of unspecified part of right bronchus or lung: Secondary | ICD-10-CM | POA: Diagnosis not present

## 2021-02-05 ENCOUNTER — Other Ambulatory Visit: Payer: Self-pay | Admitting: Radiation Therapy

## 2021-02-20 ENCOUNTER — Other Ambulatory Visit: Payer: Self-pay | Admitting: Radiation Oncology

## 2021-03-28 ENCOUNTER — Other Ambulatory Visit: Payer: Self-pay

## 2021-03-28 ENCOUNTER — Ambulatory Visit (HOSPITAL_COMMUNITY)
Admission: RE | Admit: 2021-03-28 | Discharge: 2021-03-28 | Disposition: A | Discharge status: Home / Self Care | Payer: Medicare Other | Source: Ambulatory Visit | Attending: Radiation Oncology | Admitting: Radiation Oncology

## 2021-03-28 DIAGNOSIS — G319 Degenerative disease of nervous system, unspecified: Secondary | ICD-10-CM | POA: Diagnosis not present

## 2021-03-28 DIAGNOSIS — C7949 Secondary malignant neoplasm of other parts of nervous system: Secondary | ICD-10-CM | POA: Insufficient documentation

## 2021-03-28 DIAGNOSIS — C7931 Secondary malignant neoplasm of brain: Secondary | ICD-10-CM | POA: Diagnosis not present

## 2021-03-28 MED ORDER — GADOBUTROL 1 MMOL/ML IV SOLN
6.0000 mL | Freq: Once | INTRAVENOUS | Status: AC | PRN
  Administered 2021-03-28: 6 mL via INTRAVENOUS

## 2021-04-01 ENCOUNTER — Ambulatory Visit
Admission: RE | Admit: 2021-04-01 | Discharge: 2021-04-01 | Disposition: A | Discharge status: Home / Self Care | Payer: Medicare Other | Source: Ambulatory Visit | Attending: Radiation Oncology | Admitting: Radiation Oncology

## 2021-04-01 ENCOUNTER — Encounter: Payer: Self-pay | Admitting: Radiation Oncology

## 2021-04-01 DIAGNOSIS — Z08 Encounter for follow-up examination after completed treatment for malignant neoplasm: Secondary | ICD-10-CM | POA: Diagnosis not present

## 2021-04-01 DIAGNOSIS — C3411 Malignant neoplasm of upper lobe, right bronchus or lung: Secondary | ICD-10-CM | POA: Diagnosis not present

## 2021-04-01 DIAGNOSIS — Z298 Encounter for other specified prophylactic measures: Secondary | ICD-10-CM | POA: Diagnosis not present

## 2021-04-01 DIAGNOSIS — C3491 Malignant neoplasm of unspecified part of right bronchus or lung: Secondary | ICD-10-CM

## 2021-04-01 NOTE — Progress Notes (Signed)
Patient states doing well. No symptoms reported at this time. Meaningful use complete.  Patient notified of 3:30pm-04/01/21 TELEPHONE only appointment w/ Shona Simpson PA-C and verbalized understanding.  Patient preferred contact # [phone]

## 2021-04-01 NOTE — Progress Notes (Signed)
Radiation Oncology         [phone] ________________________________   Outpatient Follow Up - Conducted via telephone due to current COVID-19 concerns for limiting patient exposure  I spoke with the patient to conduct this consult visit via telephone to spare the patient unnecessary potential exposure in the healthcare setting during the current COVID-19 pandemic. The patient was notified in advance and was offered a [facility] meeting to allow for face to face communication but unfortunately reported that they did not have the appropriate resources/technology to support such a visit and instead preferred to proceed with a telephone visit.    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   HISTORY OF PRESENT ILLNESS: Ian Hart is a 71 y.o. male with a diagnosis of lung cancer.  The patient was referred in November 2021 to Dr. Valeta Harms in pulmonary medicine for an abnormality on screening CT scan.  The patient had the scan on 02/15/2020 revealing a spiculated 13.6 mm nodule in the right upper lobe, and he subsequently underwent a PET scan on 03/01/2020 which revealed hypermetabolism within the right upper lobe nodule as well as an isolated hypermetabolic subcarinal lymph nodes suspicious for nodal disease.  No other extrathoracic hypermetabolic metastases were identified, a left renal lesion which was indeterminate but hypermetabolic was also seen and possibly suspicious for renal cell carcinoma.  He underwent a CT super D scan on 03/22/2020 and subsequent bronchoscopy.  Interestingly bronchoscopy confirms adenocarcinoma with in the lung nodule and small cell carcinoma within the 7 station lymph node.  He proceeded with chemoRT between 04/17/20 and 06/01/20. He also proceeded with prophylactic  cranial irradiation to reduce risks of brain disease. He also began Namenda to reduce risks of cognitive deficits from radiation.  His post treatment MRI on 12/06/20 showed no evidence of metastatic disease but did have fluid in bilateral mastoids. He continues on Namenda but is almost ready to finish the prescription. and is contacted today by phone.  PREVIOUS RADIATION THERAPY:   08/20/2020 through 08/31/2020 Site Technique Total Dose (Gy) Dose per Fx (Gy) Completed Fx Beam Energies  Brain: Brain Complex 25/25 2.5 10/10 6X     Radiation Treatment Dates: 04/17/2020 through 06/01/2020 Site Technique Total Dose (Gy) Dose per Fx (Gy) Completed Fx Beam Energies  Lung, Right: Lung_Rt 3D 60/60 2 30/30 6X, 10X  Lung, Right: Lung_Rt_Bst 3D 6/6 2 3/3 6X, 10X     PAST MEDICAL HISTORY:  Past Medical History:  Diagnosis Date   History of chicken pox    History of shingles 04/2010   Lung cancer ([place]) 02/2020       PAST SURGICAL HISTORY: Past Surgical History:  Procedure Laterality Date   BRONCHIAL BIOPSY  03/22/2020   Procedure: BRONCHIAL BIOPSIES;  Surgeon: Garner Nash, DO;  Location: [place] [facility];  Service: Pulmonary;;   BRONCHIAL BRUSHINGS  03/22/2020   Procedure: BRONCHIAL BRUSHINGS;  Surgeon: Garner Nash, DO;  Location: [place] [facility];  Service: Pulmonary;;   BRONCHIAL NEEDLE ASPIRATION BIOPSY  03/22/2020   Procedure: BRONCHIAL NEEDLE ASPIRATION BIOPSIES;  Surgeon: Garner Nash, DO;  Location: [place] [facility];  Service: Pulmonary;;   BRONCHIAL WASHINGS  03/22/2020   Procedure: BRONCHIAL WASHINGS;  Surgeon: Garner Nash, DO;  Location: [place] [facility];  Service: Pulmonary;;  FIDUCIAL MARKER PLACEMENT  03/22/2020   Procedure: FIDUCIAL MARKER PLACEMENT;  Surgeon: Garner Nash, DO;  Location: [place] [facility];  Service: Pulmonary;;   NO PAST SURGERIES     VIDEO BRONCHOSCOPY WITH ENDOBRONCHIAL NAVIGATION N/A 03/22/2020   Procedure: VIDEO BRONCHOSCOPY WITH ENDOBRONCHIAL NAVIGATION;   Surgeon: Garner Nash, DO;  Location: [place];  Service: Pulmonary;  Laterality: N/A;   VIDEO BRONCHOSCOPY WITH ENDOBRONCHIAL ULTRASOUND N/A 03/22/2020   Procedure: VIDEO BRONCHOSCOPY WITH ENDOBRONCHIAL ULTRASOUND;  Surgeon: Garner Nash, DO;  Location: [place];  Service: Pulmonary;  Laterality: N/A;     FAMILY HISTORY:  Family History  Problem Relation Age of Onset   Sudden death Mother    Rheumatic fever Mother    Hypertension Neg Hx    Hyperlipidemia Neg Hx    Heart attack Neg Hx    Diabetes Neg Hx    Colon cancer Neg Hx    Stomach cancer Neg Hx      SOCIAL HISTORY:  reports that he has been smoking cigarettes. He has a 100.00 pack-year smoking history. He has never used smokeless tobacco. He reports current alcohol use of about 5.0 standard drinks per week. He reports that he does not use drugs. The patient is married and lives in [place].    ALLERGIES: Acyclovir and related   MEDICATIONS:  Current Outpatient Medications  Medication Sig Dispense Refill   ascorbic acid (VITAMIN C) 500 MG tablet Take 500 mg by mouth daily.     fluticasone (FLONASE) 50 MCG/ACT nasal spray Place 2 sprays into both nostrils daily. 16 g 6   memantine (NAMENDA) 10 MG tablet TAKE ONE (1) TABLET BY MOUTH TWO (2) TIMES DAILY 60 tablet 1   Multiple Vitamin (MULTIVITAMIN) tablet Take 1 tablet by mouth daily.     tamsulosin (FLOMAX) 0.4 MG CAPS capsule Take 0.4 mg by mouth daily.     vitamin B-12 (CYANOCOBALAMIN) 1000 MCG tablet Take 1,000 mcg by mouth daily.     No current facility-administered medications for this encounter.     REVIEW OF SYSTEMS: On review of systems, the patient reports that he is doing well overall. He is pleased since his nephrectomy surgery with how he's been doing. No complaints of headaches, visual, hearing, gait or movement are noted. No other concerns are verbalized.      PHYSICAL EXAM:  Unable to assess due to encounter type.  ECOG = 0  0 -  Asymptomatic (Fully active, able to carry on all predisease activities without restriction)  1 - Symptomatic but completely ambulatory (Restricted in physically strenuous activity but ambulatory and able to carry out work of a light or sedentary nature. For example, light housework, office work)  2 - Symptomatic, <50% in bed during the day (Ambulatory and capable of all self care but unable to carry out any work activities. Up and about more than 50% of waking hours)  3 - Symptomatic, >50% in bed, but not bedbound (Capable of only limited self-care, confined to bed or chair 50% or more of waking hours)  4 - Bedbound (Completely disabled. Cannot carry on any self-care. Totally confined to bed or chair)  5 - Death   Eustace Pen MM, Creech RH, Tormey DC, et al. [phone]). "Toxicity and response criteria of the [facility]". [place]. 5 (6): 649-55    LABORATORY DATA:  Lab Results  Component Value Date   WBC 5.4 02/01/2021   HGB 9.9 (L) 02/01/2021   HCT 29.1 (  L) 02/01/2021   MCV 94.8 02/01/2021   PLT 181 02/01/2021   Lab Results  Component Value Date   NA 130 (L) 02/01/2021   K 4.7 02/01/2021   CL 95 (L) 02/01/2021   CO2 28 02/01/2021   Lab Results  Component Value Date   ALT 16 02/01/2021   AST 17 02/01/2021   ALKPHOS 56 02/01/2021   BILITOT 0.4 02/01/2021      RADIOGRAPHY: MR Brain W Wo Contrast  Result Date: 03/29/2021 CLINICAL DATA:  Lung cancer staging for metastatic disease EXAM: MRI HEAD WITHOUT AND WITH CONTRAST TECHNIQUE: Multiplanar, multiecho pulse sequences of the brain and surrounding structures were obtained without and with intravenous contrast. CONTRAST:  20mL GADAVIST GADOBUTROL 1 MMOL/ML IV SOLN COMPARISON:  MRI head 12/06/2020 FINDINGS: Brain: Ventricle size normal. There is mild atrophy most prominent the parietal lobes. Negative for acute or chronic infarct. Negative for hemorrhage or mass. Negative for metastatic disease. Normal  enhancement. Vascular: Normal arterial flow voids. Skull and upper cervical spine: No focal skeletal lesion. Sinuses/Orbits: Paranasal sinuses clear.  Negative orbit Other: None IMPRESSION: No acute abnormality and negative for metastatic disease to the brain. Electronically Signed   By: Franchot Gallo M.D.   On: 03/29/2021 18:43        IMPRESSION/PLAN: 1. 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 The patient   continues in surveillance with Dr. Julien Nordmann. Fortunately his MRI of the brain remains without disease. He continues Namenda but should finish taking this next week. He's tolerated this well without untoward side effects. We will plan to repeat MRI in 3-4 months and he will contact us if he has questions or concerns prior to that visit. 2. 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 He will follow up with [facility] in surveillance. We will follow this expectantly.   Given current concerns for patient exposure during the COVID-19 pandemic, this encounter was conducted via telephone.  The patient has provided two factor identification and has given verbal consent for this type of encounter and has been advised to only accept a meeting of this type in a secure network environment. The time spent during this encounter was 30 minutes including preparation, discussion, and coordination of the patient's care. The attendants for this meeting include Hayden Pedro  and Chad Cordial.  During the encounter, Hayden Pedro was located at [facility] Radiation Oncology Department.  Ian Hart was located at home.    The above documentation reflects my direct findings during this shared patient visit. Please see the separate note by Dr. Lisbeth Renshaw on this date for the remainder of the patient's plan of care.    Carola Rhine, PAC

## 2021-04-18 NOTE — Progress Notes (Signed)
Subjective:   Ian Hart is a 71 y.o. male who presents for an Initial Medicare Annual Wellness Visit.  I connected with Rankin today by telephone and verified that I am speaking with the correct person using two identifiers. Location patient: home Location provider: work Persons participating in the virtual visit: patient, Marine scientist.    I discussed the limitations, risks, security and privacy concerns of performing an evaluation and management service by telephone and the availability of in person appointments. I also discussed with the patient that there may be a patient responsible charge related to this service. The patient expressed understanding and verbally consented to this telephonic visit.    Interactive audio and video telecommunications were attempted between this provider and patient, however failed, due to patient having technical difficulties OR patient did not have access to video capability.  We continued and completed visit with audio only.  Some vital signs may be absent or patient reported.   Time Spent with patient on telephone encounter: 25 minutes   Review of Systems     Cardiac Risk Factors include: advanced age (>4men, >61 women);sedentary lifestyle     Objective:    Today's Vitals   04/19/21 0821  Weight: 140 lb (63.5 kg)  Height: 6\' 1"  (1.854 m)   Body mass index is 18.47 kg/m.  Advanced Directives 04/19/2021 12/07/2020 10/24/2020 08/02/2020 07/25/2020 07/02/2020 06/26/2020  Does Patient Have a Medical Advance Directive? Yes Yes Yes Yes Yes Yes Yes  Type of Paramedic of [place];Living will - [place];Living will Living will [place];Living will [place];Living will [place]  Does patient want to make changes to medical advance directive? - - No - Patient declined No - Patient declined - No - Patient declined -  Copy of [place] in  Chart? No - copy requested - No - copy requested No - copy requested No - copy requested No - copy requested No - copy requested    Current Medications (verified) Outpatient Encounter Medications as of 04/19/2021  Medication Sig   ascorbic acid (VITAMIN C) 500 MG tablet Take 500 mg by mouth daily.   memantine (NAMENDA) 10 MG tablet TAKE ONE (1) TABLET BY MOUTH TWO (2) TIMES DAILY   Multiple Vitamin (MULTIVITAMIN) tablet Take 1 tablet by mouth daily.   tamsulosin (FLOMAX) 0.4 MG CAPS capsule Take 0.4 mg by mouth daily.   vitamin B-12 (CYANOCOBALAMIN) 1000 MCG tablet Take 1,000 mcg by mouth daily.   fluticasone (FLONASE) 50 MCG/ACT nasal spray Place 2 sprays into both nostrils daily.   No facility-administered encounter medications on file as of 04/19/2021.    Allergies (verified) Acyclovir and related   History: Past Medical History:  Diagnosis Date   History of chicken pox    History of shingles 04/2010   Lung cancer ([place]) 02/2020   Past Surgical History:  Procedure Laterality Date   BRONCHIAL BIOPSY  03/22/2020   Procedure: BRONCHIAL BIOPSIES;  Surgeon: Garner Nash, DO;  Location: [place] [facility];  Service: Pulmonary;;   BRONCHIAL BRUSHINGS  03/22/2020   Procedure: BRONCHIAL BRUSHINGS;  Surgeon: Garner Nash, DO;  Location: [place] [facility];  Service: Pulmonary;;   BRONCHIAL NEEDLE ASPIRATION BIOPSY  03/22/2020   Procedure: BRONCHIAL NEEDLE ASPIRATION BIOPSIES;  Surgeon: Garner Nash, DO;  Location: [place] [facility];  Service: Pulmonary;;   BRONCHIAL WASHINGS  03/22/2020   Procedure: BRONCHIAL WASHINGS;  Surgeon: Garner Nash, DO;  Location:  [place] [facility];  Service: Pulmonary;;   FIDUCIAL MARKER PLACEMENT  03/22/2020   Procedure: FIDUCIAL MARKER PLACEMENT;  Surgeon: Garner Nash, DO;  Location: [place] [facility];  Service: Pulmonary;;   NO PAST SURGERIES     VIDEO BRONCHOSCOPY WITH ENDOBRONCHIAL NAVIGATION N/A 03/22/2020   Procedure: VIDEO BRONCHOSCOPY WITH ENDOBRONCHIAL NAVIGATION;   Surgeon: Garner Nash, DO;  Location: [place];  Service: Pulmonary;  Laterality: N/A;   VIDEO BRONCHOSCOPY WITH ENDOBRONCHIAL ULTRASOUND N/A 03/22/2020   Procedure: VIDEO BRONCHOSCOPY WITH ENDOBRONCHIAL ULTRASOUND;  Surgeon: Garner Nash, DO;  Location: [place];  Service: Pulmonary;  Laterality: N/A;   Family History  Problem Relation Age of Onset   Sudden death Mother    Rheumatic fever Mother    Hypertension Neg Hx    Hyperlipidemia Neg Hx    Heart attack Neg Hx    Diabetes Neg Hx    Colon cancer Neg Hx    Stomach cancer Neg Hx    Social History   Socioeconomic History   Marital status: Married    Spouse name: Not on file   Number of children: 1   Years of education: Not on file   Highest education level: Not on file  Occupational History   Occupation: retired  Tobacco Use   Smoking status: Former    Packs/day: 2.00    Years: 50.00    Pack years: 100.00    Types: Cigarettes   Smokeless tobacco: Never   Tobacco comments:    07/25/20  Vaping Use   Vaping Use: Never used  Substance and Sexual Activity   Alcohol use: Yes    Alcohol/week: 5.0 standard drinks    Types: 3 Cans of beer, 2 Shots of liquor per week    Comment: daily   Drug use: No   Sexual activity: Yes  Other Topics Concern   Not on file  Social History Narrative   Regular exercise:  2-3 x weekly (sports, yardwork)   Caffeine use:  3 cups coffee daily   72 yr old son   Wife   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           Social Determinants of Health   Financial Resource Strain: Low Risk    Difficulty of Paying Living Expenses: Not hard at all  Food Insecurity: No Food Insecurity   Worried About Charity fundraiser in the Last Year: Never true   [place] in the Last Year: Never true  Transportation Needs: No Transportation Needs   Lack of Transportation (Medical): No   Lack of Transportation (Non-Medical): No  Physical  Activity: Insufficiently Active   Days of Exercise per Week: 7 days   Minutes of Exercise per Session: 20 min  Stress: No Stress Concern Present   Feeling of Stress : Not at all  Social Connections: Moderately Integrated   Frequency of Communication with Friends and Family: More than three times a week   Frequency of Social Gatherings with Friends and Family: More than three times a week   Attends Religious Services: More than 4 times per year   Active Member of Clubs or Organizations: No   Attends Archivist Meetings: Never   Marital Status: Married    Tobacco Counseling Counseling given: Not Answered Tobacco comments: 07/25/20   Clinical Intake:  Pre-visit preparation completed: Yes  Pain : No/denies pain     BMI - recorded: 18.47 Nutritional Status:  BMI <19  Underweight Nutritional Risks: None Diabetes: No  How often do you need to have someone help you when you read instructions, pamphlets, or other written materials from your doctor or pharmacy?: 1 - Never  Diabetic?No  Interpreter Needed?: No  Information entered by :: Caroleen Hamman LPN   Activities of Daily Living In your present state of health, do you have any difficulty performing the following activities: 04/19/2021  Hearing? Y  Comment seeing audiologist  Vision? N  Difficulty concentrating or making decisions? N  Walking or climbing stairs? N  Dressing or bathing? N  Doing errands, shopping? N  Preparing Food and eating ? N  Using the Toilet? N  In the past six months, have you accidently leaked urine? N  Do you have problems with loss of bowel control? N  Managing your Medications? N  Managing your Finances? N  Housekeeping or managing your Housekeeping? N  Some recent data might be hidden    Patient Care Team: Debbrah Alar, NP as PCP - General (Internal Medicine)  Indicate any recent Medical Services you may have received from other than [facility] providers in the past year  (date may be approximate).     Assessment:   This is a routine wellness examination for [place].  Hearing/Vision screen Hearing Screening - Comments:: C/o mild hearing loss-seeing audiologist Vision Screening - Comments:: Last eye exam-summer 2022  Dietary issues and exercise activities discussed: Current Exercise Habits: Home exercise routine, Type of exercise: walking, Time (Minutes): 20, Frequency (Times/Week): 7, Weekly Exercise (Minutes/Week): 140, Intensity: Mild, Exercise limited by: None identified   Goals Addressed             This Visit's Progress    Patient Stated       Gain more weight & increase exercise       Depression Screen PHQ 2/9 Scores 04/19/2021 02/10/2020  PHQ - 2 Score 0 1  PHQ- 9 Score - 7    Fall Risk Fall Risk  04/19/2021  Falls in the past year? 0  Number falls in past yr: 0  Injury with Fall? 0  Follow up Falls prevention discussed    FALL RISK PREVENTION PERTAINING TO THE HOME:  Any stairs in or around the home? No  Home free of loose throw rugs in walkways, pet beds, electrical cords, etc? Yes  Adequate lighting in your home to reduce risk of falls? Yes   ASSISTIVE DEVICES UTILIZED TO PREVENT FALLS:  Life alert? No  Use of a cane, walker or w/c? No  Grab bars in the bathroom? No  Shower chair or bench in shower? No  Elevated toilet seat or a handicapped toilet? No   TIMED UP AND GO:  Was the test performed? No . Phone visit   Cognitive Function:Normal cognitive status assessed by this Nurse Health Advisor. No abnormalities found.           Immunizations Immunization History  Administered Date(s) Administered   Fluad Quad(high Dose 65+) 01/27/2020   Influenza, High Dose Seasonal PF 01/29/2021   Influenza,inj,Quad PF,6+ Mos 02/04/2013, 01/02/2014   PFIZER(Purple Top)SARS-COV-2 Vaccination 06/06/2019, 06/27/2019, 03/09/2020, 09/12/2020   Pneumococcal Polysaccharide-23 02/10/2020, 01/29/2021   Tdap 11/10/2011   Zoster, Live  12/11/2011    TDAP status: Up to date  Flu Vaccine status: Up to date  Pneumococcal vaccine status: Up to date  Covid-19 vaccine status: Completed vaccines  Qualifies for Shingles Vaccine? Yes   Zostavax completed Yes   Shingrix Completed?: No.  Education has been provided regarding the importance of this vaccine. Patient has been advised to call insurance company to determine out of pocket expense if they have not yet received this vaccine. Advised may also receive vaccine at local pharmacy or Health Dept. Verbalized acceptance and understanding.  Screening Tests Health Maintenance  Topic Date Due   Hepatitis C Screening  Never done   Zoster Vaccines- Shingrix (1 of 2) Never done   COVID-19 Vaccine (5 - Booster for Pfizer series) 11/07/2020   TETANUS/TDAP  11/09/2021   COLONOSCOPY (Pts 45-80yrs Insurance coverage will need to be confirmed)  01/08/2022   Pneumonia Vaccine 38+ Years old (3 - PCV) 01/29/2022   INFLUENZA VACCINE  Completed   HPV VACCINES  Aged Out    Health Maintenance  Health Maintenance Due  Topic Date Due   Hepatitis C Screening  Never done   Zoster Vaccines- Shingrix (1 of 2) Never done   COVID-19 Vaccine (5 - Booster for Pfizer series) 11/07/2020    Colorectal cancer screening: Type of screening: Colonoscopy. Completed 01/09/2012. Repeat every 10 years  Lung Cancer Screening: Chest CT done -03/28/2021  Additional Screening:  Hepatitis C Screening: does qualify; Discuss with PCP   Vision Screening: Recommended annual ophthalmology exams for early detection of glaucoma and other disorders of the eye. Is the patient up to date with their annual eye exam?  Yes  Who is the provider or what is the name of the office in which the patient attends annual eye exams? Patient unsure of name   Dental Screening: Recommended annual dental exams for proper oral hygiene  Community Resource Referral / Chronic Care Management: CRR required this visit?  No   CCM  required this visit?  No      Plan:     I have personally reviewed and noted the following in the patients chart:   Medical and social history Use of alcohol, tobacco or illicit drugs  Current medications and supplements including opioid prescriptions. Patient is not currently taking opioid prescriptions. Functional ability and status Nutritional status Physical activity Advanced directives List of other physicians Hospitalizations, surgeries, and ER visits in previous 12 months Vitals Screenings to include cognitive, depression, and falls Referrals and appointments  In addition, I have reviewed and discussed with patient certain preventive protocols, quality metrics, and best practice recommendations. A written personalized care plan for preventive services as well as general preventive health recommendations were provided to patient.   Due to this being a telephonic visit, the after visit summary with patients personalized plan was offered to patient via mail or my-chart. Patient would like to access on my-chart.   Marta Antu, LPN   05/24/5619  Nurse Health Advisor  Nurse Notes: None

## 2021-04-19 ENCOUNTER — Ambulatory Visit (INDEPENDENT_AMBULATORY_CARE_PROVIDER_SITE_OTHER): Payer: BC Managed Care – PPO

## 2021-04-19 VITALS — Ht 73.0 in | Wt 140.0 lb

## 2021-04-19 DIAGNOSIS — Z0000 Encounter for general adult medical examination without abnormal findings: Secondary | ICD-10-CM | POA: Diagnosis not present

## 2021-04-19 NOTE — Patient Instructions (Signed)
Ian Hart , Thank you for taking time to complete your Medicare Wellness Visit. I appreciate your ongoing commitment to your health goals. Please review the following plan we discussed and let me know if I can assist you in the future.   Screening recommendations/referrals: Colonoscopy: Completed 01/09/2012-Due 01/08/2022 Recommended yearly ophthalmology/optometry visit for glaucoma screening and checkup Recommended yearly dental visit for hygiene and checkup  Vaccinations: Influenza vaccine: Up to date Pneumococcal vaccine: Up to date Tdap vaccine: Up to date Shingles vaccine: Discuss with pharmacy   Covid-19: Up to date  Advanced directives: Please bring a copy of Living Will and/or Healthcare Power of Attorney for your chart.   Conditions/risks identified: See problem list  Next appointment: Follow up in one year for your annual wellness visit. 04/22/2022 @ 8:20 (phone visit)  Preventive Care 65 Years and Older, Male Preventive care refers to lifestyle choices and visits with your health care provider that can promote health and wellness. What does preventive care include? A yearly physical exam. This is also called an annual well check. Dental exams once or twice a year. Routine eye exams. Ask your health care provider how often you should have your eyes checked. Personal lifestyle choices, including: Daily care of your teeth and gums. Regular physical activity. Eating a healthy diet. Avoiding tobacco and drug use. Limiting alcohol use. Practicing safe sex. Taking low doses of aspirin every day. Taking vitamin and mineral supplements as recommended by your health care provider. What happens during an annual well check? The services and screenings done by your health care provider during your annual well check will depend on your age, overall health, lifestyle risk factors, and family history of disease. Counseling  Your health care provider may ask you questions about  your: Alcohol use. Tobacco use. Drug use. Emotional well-being. Home and relationship well-being. Sexual activity. Eating habits. History of falls. Memory and ability to understand (cognition). Work and work Statistician. Screening  You may have the following tests or measurements: Height, weight, and BMI. Blood pressure. Lipid and cholesterol levels. These may be checked every 5 years, or more frequently if you are over 27 years old. Skin check. Lung cancer screening. You may have this screening every year starting at age 3 if you have a 30-pack-year history of smoking and currently smoke or have quit within the past 15 years. Fecal occult blood test (FOBT) of the stool. You may have this test every year starting at age 47. Flexible sigmoidoscopy or colonoscopy. You may have a sigmoidoscopy every 5 years or a colonoscopy every 10 years starting at age 26. Prostate cancer screening. Recommendations will vary depending on your family history and other risks. Hepatitis C blood test. Hepatitis B blood test. Sexually transmitted disease (STD) testing. Diabetes screening. This is done by checking your blood sugar (glucose) after you have not eaten for a while (fasting). You may have this done every 1-3 years. Abdominal aortic aneurysm (AAA) screening. You may need this if you are a current or former smoker. Osteoporosis. You may be screened starting at age 106 if you are at high risk. Talk with your health care provider about your test results, treatment options, and if necessary, the need for more tests. Vaccines  Your health care provider may recommend certain vaccines, such as: Influenza vaccine. This is recommended every year. Tetanus, diphtheria, and acellular pertussis (Tdap, Td) vaccine. You may need a Td booster every 10 years. Zoster vaccine. You may need this after age 43. Pneumococcal 13-valent conjugate (  PCV13) vaccine. One dose is recommended after age 21. Pneumococcal  polysaccharide (PPSV23) vaccine. One dose is recommended after age 73. Talk to your health care provider about which screenings and vaccines you need and how often you need them. This information is not intended to replace advice given to you by your health care provider. Make sure you discuss any questions you have with your health care provider. Document Released: 04/27/2015 Document Revised: 12/19/2015 Document Reviewed: 01/30/2015 Elsevier Interactive Patient Education  2017 [place] Prevention in the Home Falls can cause injuries. They can happen to people of all ages. There are many things you can do to make your home safe and to help prevent falls. What can I do on the outside of my home? Regularly fix the edges of walkways and driveways and fix any cracks. Remove anything that might make you trip as you walk through a door, such as a raised step or threshold. Trim any bushes or trees on the path to your home. Use bright outdoor lighting. Clear any walking paths of anything that might make someone trip, such as rocks or tools. Regularly check to see if handrails are loose or broken. Make sure that both sides of any steps have handrails. Any raised decks and porches should have guardrails on the edges. Have any leaves, snow, or ice cleared regularly. Use sand or salt on walking paths during winter. Clean up any spills in your garage right away. This includes oil or grease spills. What can I do in the bathroom? Use night lights. Install grab bars by the toilet and in the tub and shower. Do not use towel bars as grab bars. Use non-skid mats or decals in the tub or shower. If you need to sit down in the shower, use a plastic, non-slip stool. Keep the floor dry. Clean up any water that spills on the floor as soon as it happens. Remove soap buildup in the tub or shower regularly. Attach bath mats securely with double-sided non-slip rug tape. Do not have throw rugs and other  things on the floor that can make you trip. What can I do in the bedroom? Use night lights. Make sure that you have a light by your bed that is easy to reach. Do not use any sheets or blankets that are too big for your bed. They should not hang down onto the floor. Have a firm chair that has side arms. You can use this for support while you get dressed. Do not have throw rugs and other things on the floor that can make you trip. What can I do in the kitchen? Clean up any spills right away. Avoid walking on wet floors. Keep items that you use a lot in easy-to-reach places. If you need to reach something above you, use a strong step stool that has a grab bar. Keep electrical cords out of the way. Do not use floor polish or wax that makes floors slippery. If you must use wax, use non-skid floor wax. Do not have throw rugs and other things on the floor that can make you trip. What can I do with my stairs? Do not leave any items on the stairs. Make sure that there are handrails on both sides of the stairs and use them. Fix handrails that are broken or loose. Make sure that handrails are as long as the stairways. Check any carpeting to make sure that it is firmly attached to the stairs. Fix any carpet that is  loose or worn. Avoid having throw rugs at the top or bottom of the stairs. If you do have throw rugs, attach them to the floor with carpet tape. Make sure that you have a light switch at the top of the stairs and the bottom of the stairs. If you do not have them, ask someone to add them for you. What else can I do to help prevent falls? Wear shoes that: Do not have high heels. Have rubber bottoms. Are comfortable and fit you well. Are closed at the toe. Do not wear sandals. If you use a stepladder: Make sure that it is fully opened. Do not climb a closed stepladder. Make sure that both sides of the stepladder are locked into place. Ask someone to hold it for you, if possible. Clearly  mark and make sure that you can see: Any grab bars or handrails. First and last steps. Where the edge of each step is. Use tools that help you move around (mobility aids) if they are needed. These include: Canes. Walkers. Scooters. Crutches. Turn on the lights when you go into a dark area. Replace any light bulbs as soon as they burn out. Set up your furniture so you have a clear path. Avoid moving your furniture around. If any of your floors are uneven, fix them. If there are any pets around you, be aware of where they are. Review your medicines with your doctor. Some medicines can make you feel dizzy. This can increase your chance of falling. Ask your doctor what other things that you can do to help prevent falls. This information is not intended to replace advice given to you by your health care provider. Make sure you discuss any questions you have with your health care provider. Document Released: 01/25/2009 Document Revised: 09/06/2015 Document Reviewed: 05/05/2014 Elsevier Interactive Patient Education  2017 [employer].

## 2021-05-03 ENCOUNTER — Other Ambulatory Visit: Payer: Self-pay

## 2021-05-03 DIAGNOSIS — C7931 Secondary malignant neoplasm of brain: Secondary | ICD-10-CM

## 2021-05-06 ENCOUNTER — Other Ambulatory Visit: Payer: Self-pay

## 2021-06-03 ENCOUNTER — Encounter: Payer: Self-pay | Admitting: Internal Medicine

## 2021-06-03 ENCOUNTER — Encounter: Payer: Self-pay | Admitting: Physician Assistant

## 2021-06-04 ENCOUNTER — Ambulatory Visit (HOSPITAL_COMMUNITY)
Admission: RE | Admit: 2021-06-04 | Discharge: 2021-06-04 | Disposition: A | Discharge status: Home / Self Care | Payer: BC Managed Care – PPO | Source: Ambulatory Visit | Attending: Physician Assistant | Admitting: Physician Assistant

## 2021-06-04 ENCOUNTER — Inpatient Hospital Stay: Payer: BC Managed Care – PPO | Attending: Internal Medicine

## 2021-06-04 ENCOUNTER — Other Ambulatory Visit: Payer: Self-pay

## 2021-06-04 DIAGNOSIS — C3411 Malignant neoplasm of upper lobe, right bronchus or lung: Secondary | ICD-10-CM | POA: Insufficient documentation

## 2021-06-04 DIAGNOSIS — J439 Emphysema, unspecified: Secondary | ICD-10-CM | POA: Diagnosis not present

## 2021-06-04 DIAGNOSIS — C3491 Malignant neoplasm of unspecified part of right bronchus or lung: Secondary | ICD-10-CM | POA: Insufficient documentation

## 2021-06-04 DIAGNOSIS — C642 Malignant neoplasm of left kidney, except renal pelvis: Secondary | ICD-10-CM | POA: Insufficient documentation

## 2021-06-04 DIAGNOSIS — I700 Atherosclerosis of aorta: Secondary | ICD-10-CM | POA: Diagnosis not present

## 2021-06-04 LAB — CBC WITH DIFFERENTIAL (CANCER CENTER ONLY)
Abs Immature Granulocytes: 0.01 10*3/uL (ref 0.00–0.07)
Basophils Absolute: 0.1 10*3/uL (ref 0.0–0.1)
Basophils Relative: 1 %
Eosinophils Absolute: 0.1 10*3/uL (ref 0.0–0.5)
Eosinophils Relative: 2 %
HCT: 35.1 % — ABNORMAL LOW (ref 39.0–52.0)
Hemoglobin: 12 g/dL — ABNORMAL LOW (ref 13.0–17.0)
Immature Granulocytes: 0 %
Lymphocytes Relative: 14 %
Lymphs Abs: 0.7 10*3/uL (ref 0.7–4.0)
MCH: 32.8 pg (ref 26.0–34.0)
MCHC: 34.2 g/dL (ref 30.0–36.0)
MCV: 95.9 fL (ref 80.0–100.0)
Monocytes Absolute: 0.6 10*3/uL (ref 0.1–1.0)
Monocytes Relative: 11 %
Neutro Abs: 3.7 10*3/uL (ref 1.7–7.7)
Neutrophils Relative %: 72 %
Platelet Count: 192 10*3/uL (ref 150–400)
RBC: 3.66 MIL/uL — ABNORMAL LOW (ref 4.22–5.81)
RDW: 11.9 % (ref 11.5–15.5)
WBC Count: 5.2 10*3/uL (ref 4.0–10.5)
nRBC: 0 % (ref 0.0–0.2)

## 2021-06-04 LAB — CMP (CANCER CENTER ONLY)
ALT: 15 U/L (ref 0–44)
AST: 21 U/L (ref 15–41)
Albumin: 4.4 g/dL (ref 3.5–5.0)
Alkaline Phosphatase: 68 U/L (ref 38–126)
Anion gap: 6 (ref 5–15)
BUN: 14 mg/dL (ref 8–23)
CO2: 27 mmol/L (ref 22–32)
Calcium: 9.2 mg/dL (ref 8.9–10.3)
Chloride: 97 mmol/L — ABNORMAL LOW (ref 98–111)
Creatinine: 1.13 mg/dL (ref 0.61–1.24)
GFR, Estimated: >60 mL/min (ref >60)
Glucose, Bld: 90 mg/dL (ref 70–99)
Potassium: 4.4 mmol/L (ref 3.5–5.1)
Sodium: 130 mmol/L — ABNORMAL LOW (ref 135–145)
Total Bilirubin: 0.4 mg/dL (ref 0.3–1.2)
Total Protein: 6.9 g/dL (ref 6.5–8.1)

## 2021-06-04 MED ORDER — IOHEXOL 300 MG/ML  SOLN
75.0000 mL | Freq: Once | INTRAMUSCULAR | Status: AC | PRN
  Administered 2021-06-04: 75 mL via INTRAVENOUS

## 2021-06-06 ENCOUNTER — Inpatient Hospital Stay (HOSPITAL_BASED_OUTPATIENT_CLINIC_OR_DEPARTMENT_OTHER): Payer: BC Managed Care – PPO | Admitting: Internal Medicine

## 2021-06-06 ENCOUNTER — Encounter: Payer: Self-pay | Admitting: Internal Medicine

## 2021-06-06 ENCOUNTER — Other Ambulatory Visit: Payer: Self-pay

## 2021-06-06 VITALS — BP 133/73 | HR 89 | Temp 98.1°F | Resp 16 | Ht 73.0 in | Wt 136.4 lb

## 2021-06-06 DIAGNOSIS — Z905 Acquired absence of kidney: Secondary | ICD-10-CM

## 2021-06-06 DIAGNOSIS — D61818 Other pancytopenia: Secondary | ICD-10-CM | POA: Diagnosis not present

## 2021-06-06 DIAGNOSIS — C3490 Malignant neoplasm of unspecified part of unspecified bronchus or lung: Secondary | ICD-10-CM

## 2021-06-06 DIAGNOSIS — C3491 Malignant neoplasm of unspecified part of right bronchus or lung: Secondary | ICD-10-CM

## 2021-06-06 DIAGNOSIS — R5383 Other fatigue: Secondary | ICD-10-CM | POA: Diagnosis not present

## 2021-06-06 DIAGNOSIS — C642 Malignant neoplasm of left kidney, except renal pelvis: Secondary | ICD-10-CM | POA: Diagnosis not present

## 2021-06-06 DIAGNOSIS — C3411 Malignant neoplasm of upper lobe, right bronchus or lung: Secondary | ICD-10-CM | POA: Diagnosis not present

## 2021-06-06 NOTE — Progress Notes (Signed)
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 3) Suspicious left renal lesion   PRIOR THERAPY: Systemic chemotherapy/radiation with cisplatin 80 mg per metered squared on day 1 and etoposide 100 mg per metered squared on days 1, 2, and 3 IV every 3 weeks.  Status post 4 cycles.  Starting from cycle #3, cisplatin was changed to carboplatin for an AUC of 5 due to renal insufficiency. First dose on 04/20/20. Onpro was added to the treatment plan starting from cycle #3 due to pancytopenia.     CURRENT THERAPY:  Observation  INTERVAL HISTORY: Ian Hart 71 y.o. male returns to the clinic today for follow-up visit accompanied by his wife.  The patient is feeling fine today with no concerning complaints.  He denied having any fatigue or weakness.  He denied having any chest pain, shortness of breath, cough or hemoptysis.  He has no nausea, vomiting, diarrhea or constipation.  He has no headache or visual changes.  He denied having any fever or chills.  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  He is recovering well from his surgery.  The patient is here today for evaluation with repeat CT scan of the chest for restaging of his disease.  MEDICAL HISTORY: Past Medical History:  Diagnosis Date   History of chicken pox    History of shingles 04/2010   Lung cancer ([place]) 02/2020    ALLERGIES:  is allergic to acyclovir and related.  MEDICATIONS:  Current Outpatient Medications  Medication Sig Dispense Refill    ascorbic acid (VITAMIN C) 500 MG tablet Take 500 mg by mouth daily.     fluticasone (FLONASE) 50 MCG/ACT nasal spray Place 2 sprays into both nostrils daily. 16 g 6   memantine (NAMENDA) 10 MG tablet TAKE ONE (1) TABLET BY MOUTH TWO (2) TIMES DAILY 60 tablet 1   Multiple Vitamin (MULTIVITAMIN) tablet Take 1 tablet by mouth daily.     tamsulosin (FLOMAX) 0.4 MG CAPS capsule Take 0.4 mg by mouth daily.     vitamin B-12 (CYANOCOBALAMIN) 1000 MCG tablet Take 1,000 mcg by mouth daily.     No current facility-administered medications for this visit.    SURGICAL HISTORY:  Past Surgical History:  Procedure Laterality Date   BRONCHIAL BIOPSY  03/22/2020   Procedure: BRONCHIAL BIOPSIES;  Surgeon: Garner Nash, DO;  Location: [facility];  Service: Pulmonary;;   BRONCHIAL BRUSHINGS  03/22/2020   Procedure: BRONCHIAL BRUSHINGS;  Surgeon: Garner Nash, DO;  Location: [place] [facility];  Service: Pulmonary;;   BRONCHIAL NEEDLE ASPIRATION BIOPSY  03/22/2020   Procedure: BRONCHIAL NEEDLE ASPIRATION BIOPSIES;  Surgeon: Garner Nash, DO;  Location: [facility] [facility];  Service: Pulmonary;;   BRONCHIAL WASHINGS  03/22/2020   Procedure: BRONCHIAL WASHINGS;  Surgeon: Garner Nash, DO;  Location: [facility];  Service: Pulmonary;;   FIDUCIAL MARKER PLACEMENT  03/22/2020   Procedure: FIDUCIAL MARKER PLACEMENT;  Surgeon: Garner Nash, DO;  Location: [facility];  Service: Pulmonary;;   NO PAST SURGERIES  VIDEO BRONCHOSCOPY WITH ENDOBRONCHIAL NAVIGATION N/A 03/22/2020   Procedure: VIDEO BRONCHOSCOPY WITH ENDOBRONCHIAL NAVIGATION;  Surgeon: Garner Nash, DO;  Location: [facility];  Service: Pulmonary;  Laterality: N/A;   VIDEO BRONCHOSCOPY WITH ENDOBRONCHIAL ULTRASOUND N/A 03/22/2020   Procedure: VIDEO BRONCHOSCOPY WITH ENDOBRONCHIAL ULTRASOUND;  Surgeon: Garner Nash, DO;  Location: [facility];  Service: Pulmonary;  Laterality: N/A;    REVIEW OF SYSTEMS:  A comprehensive review of systems  was negative.   PHYSICAL EXAMINATION: General appearance: alert, cooperative, and no distress Head: Normocephalic, without obvious abnormality, atraumatic Neck: no adenopathy, no JVD, supple, symmetrical, trachea midline, and thyroid not enlarged, symmetric, no tenderness/mass/nodules Lymph nodes: Cervical, supraclavicular, and axillary nodes normal. Resp: clear to auscultation bilaterally Back: symmetric, no curvature. ROM normal. No CVA tenderness. Cardio: regular rate and rhythm, S1, S2 normal, no murmur, click, rub or gallop GI: soft, non-tender; bowel sounds normal; no masses,  no organomegaly Extremities: extremities normal, atraumatic, no cyanosis or edema  ECOG PERFORMANCE STATUS: 1 - Symptomatic but completely ambulatory  Blood pressure 133/73, pulse 89, temperature 98.1 F (36.7 C), temperature source Tympanic, resp. rate 16, height 6\' 1"  (1.854 m), weight 136 lb 6.4 oz (61.9 kg), SpO2 100 %.  LABORATORY DATA: Lab Results  Component Value Date   WBC 5.2 06/04/2021   HGB 12.0 (L) 06/04/2021   HCT 35.1 (L) 06/04/2021   MCV 95.9 06/04/2021   PLT 192 06/04/2021      Chemistry      Component Value Date/Time   NA 130 (L) 06/04/2021 1026   K 4.4 06/04/2021 1026   CL 97 (L) 06/04/2021 1026   CO2 27 06/04/2021 1026   BUN 14 06/04/2021 1026   CREATININE 1.13 06/04/2021 1026   CREATININE 0.86 11/12/2011 0917      Component Value Date/Time   CALCIUM 9.2 06/04/2021 1026   ALKPHOS 68 06/04/2021 1026   AST 21 06/04/2021 1026   ALT 15 06/04/2021 1026   BILITOT 0.4 06/04/2021 1026       RADIOGRAPHIC STUDIES: CT Chest W Contrast  Result Date: 06/05/2021 CLINICAL DATA:  Primary Cancer Type: Lung Imaging Indication: Routine surveillance Interval therapy since last imaging? 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 Primary Tumor location:   Right upper lobe. Surgeries: No thoracic. 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 Chemotherapy: Yes; Ongoing? No; Most recent administration: 06/25/2020 Immunotherapy? No Radiation therapy? Yes Date Range: 08/20/2020 - 08/31/2020; Target: Brain Date Range: 04/17/2020 - 06/01/2020; Target: Right lung EXAM: CT CHEST WITH CONTRAST TECHNIQUE: Multidetector CT imaging of the chest was performed during intravenous contrast administration. RADIATION DOSE REDUCTION: This exam was performed according to the departmental dose-optimization program which includes automated exposure control, adjustment of the mA and/or kV according to patient size and/or use of iterative reconstruction technique. CONTRAST:  65mL OMNIPAQUE IOHEXOL 300 MG/ML  SOLN COMPARISON:  Most recent CT chest 02/01/2021. 03/01/2020 PET-CT. FINDINGS: Cardiovascular: Calcified coronary artery disease of LEFT circumflex. Scattered aortic calcifications. Normal caliber of the thoracic aorta. Normal caliber of central pulmonary vessels. Mediastinum/Nodes: Mild thickening of the esophagus is similar to previous imaging. No signs of adenopathy in the chest. Lungs/Pleura: Small nodular density adjacent to fiducial markers in the RIGHT chest currently measuring 11 x 5 mm previously 11 x 6 mm (image 50/5) No sign of lobar consolidative changes. No new  pulmonary nodules. Pulmonary emphysema. Endobronchial filling defect or lesion along the bronchus intermedius on the RIGHT (image 83/5) 8 x 5 x 7 mm. As a more nodular appearance perhaps than expected for simple secretions. No additional filling defects or signs of debris within the bronchi. This is new compared to prior imaging. Upper Abdomen: Incidental imaging of upper abdominal contents shows no acute process related to liver, pancreas, spleen, adrenal glands or visualized kidneys. Postoperative changes about the  LEFT kidney posteriorly are incompletely imaged. Musculoskeletal: No acute bone finding. No destructive bone process. Spinal degenerative changes. IMPRESSION: 1. Small nodular density adjacent to fiducial markers in the RIGHT chest currently measuring 11 x 5 mm previously 11 x 6 mm. This is amidst ground-glass and post treatment changes. 2. Endobronchial filling defect or lesion along the bronchus intermedius on the RIGHT 8 x 5 x 7 mm. No signs of secretions elsewhere and perhaps slightly more nodular than expected for simple secretions. Would consider short interval follow-up to ensure clearing. 3. Calcified coronary artery disease of LEFT circumflex. 4. Mild thickening of the esophagus is similar to previous imaging. Likely related to post treatment change. Aortic Atherosclerosis (ICD10-I70.0) and Emphysema (ICD10-J43.9). Electronically Signed   By: Zetta Bills M.D.   On: 06/05/2021 10:51    ASSESSMENT AND PLAN: This is a very pleasant 71 years old white male diagnosed with poorly differentiated carcinoma consistent with adenocarcinoma in the right upper lobe.  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  The patient also has a suspicious left renal lesion concerning for renal cell carcinoma. He is status post a course of systemic chemotherapy initially with cisplatin and etoposide but the cisplatin was discontinued secondary to renal insufficiency and the patient continued 3 more cycles of his systemic chemotherapy with carboplatin and etoposide concurrent with radiation.  He has a rough time with the treatment including pancytopenia as well as fatigue and weight loss. He is feeling much better these days with no concerning complaints. The patient had repeat CT scan of the chest performed recently.  I personally and independently reviewed the scans and discussed the results with the patient and his wife. His scan showed no concerning findings for disease  recurrence or metastasis. He has some endobronchial filling defect along the bronchus intermedius suspicious for secretions but need close monitoring. I recommended for the patient to continue on observation with repeat CT scan of the chest in 3 months for further evaluation of his condition. 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 The patient was advised to call immediately if he has any other concerning symptoms in the interval. The patient voices understanding of current disease status and treatment options and is in agreement with the current care plan.  All questions were answered. The patient knows to call the clinic with any problems, questions or concerns. We can certainly see the patient much sooner if necessary.   Disclaimer: This note was dictated with voice recognition software. Similar sounding words can inadvertently be transcribed and may not be corrected upon review.

## 2021-07-02 ENCOUNTER — Encounter: Payer: Self-pay | Admitting: Internal Medicine

## 2021-07-02 ENCOUNTER — Encounter: Payer: Self-pay | Admitting: Physician Assistant

## 2021-07-05 ENCOUNTER — Encounter: Payer: Self-pay | Admitting: Internal Medicine

## 2021-07-05 ENCOUNTER — Encounter: Payer: Self-pay | Admitting: Radiation Oncology

## 2021-07-05 ENCOUNTER — Encounter: Payer: Self-pay | Admitting: Physician Assistant

## 2021-07-05 ENCOUNTER — Ambulatory Visit (HOSPITAL_COMMUNITY)
Admission: RE | Admit: 2021-07-05 | Discharge: 2021-07-05 | Disposition: A | Discharge status: Home / Self Care | Payer: BC Managed Care – PPO | Source: Ambulatory Visit | Attending: Radiation Oncology | Admitting: Radiation Oncology

## 2021-07-05 ENCOUNTER — Other Ambulatory Visit: Payer: Self-pay

## 2021-07-05 DIAGNOSIS — C7931 Secondary malignant neoplasm of brain: Secondary | ICD-10-CM | POA: Diagnosis not present

## 2021-07-05 DIAGNOSIS — G319 Degenerative disease of nervous system, unspecified: Secondary | ICD-10-CM | POA: Diagnosis not present

## 2021-07-05 MED ORDER — GADOBUTROL 1 MMOL/ML IV SOLN
7.0000 mL | Freq: Once | INTRAVENOUS | Status: AC | PRN
  Administered 2021-07-05: 7 mL via INTRAVENOUS

## 2021-07-05 NOTE — Progress Notes (Signed)
Telephone follow-up. I verified patient identity and began nursing interview. Patient reports doing well. Denies chest pain, shortness of breath, dizziness, visual changes, headaches or urinary issues (I-PSS score of 2). No problems reported at this time. ? ?Meaningful use complete. ? ? ?Patient reminded of his 2:00pm-07/08/21 telephone appointment w/ Shona Simpson PA-C. I left my extension [phone] in case patient needs anything. Patient verbalized understanding of information. ? ?Patient contact [phone] ?

## 2021-07-08 ENCOUNTER — Inpatient Hospital Stay: Payer: Medicare Other | Attending: Internal Medicine

## 2021-07-08 ENCOUNTER — Ambulatory Visit
Admission: RE | Admit: 2021-07-08 | Discharge: 2021-07-08 | Disposition: A | Discharge status: Home / Self Care | Payer: Medicare Other | Source: Ambulatory Visit | Attending: Radiation Oncology | Admitting: Radiation Oncology

## 2021-07-08 DIAGNOSIS — C3491 Malignant neoplasm of unspecified part of right bronchus or lung: Secondary | ICD-10-CM

## 2021-07-08 DIAGNOSIS — Z08 Encounter for follow-up examination after completed treatment for malignant neoplasm: Secondary | ICD-10-CM | POA: Diagnosis not present

## 2021-07-08 DIAGNOSIS — Z298 Encounter for other specified prophylactic measures: Secondary | ICD-10-CM | POA: Diagnosis not present

## 2021-07-08 MED ORDER — METHYLPREDNISOLONE 4 MG PO TABS
ORAL_TABLET | ORAL | 0 refills | Status: DC
Start: 2021-07-08 — End: 2021-11-04

## 2021-07-08 NOTE — Progress Notes (Signed)
?Radiation Oncology         [phone] ?________________________________ ? ? Outpatient Follow Up - Conducted via telephone due to current COVID-19 concerns for limiting patient exposure ? ?I spoke with the patient to conduct this consult visit via telephone to spare the patient unnecessary potential exposure in the healthcare setting during the current COVID-19 pandemic. The patient was notified in advance and was offered a [place] meeting to allow for face to face communication but unfortunately reported that they did not have the appropriate resources/technology to support such a visit and instead preferred to proceed with a telephone visit. ? ? ? ?Name: Ian Hart        MRN: [id number]  ?Date of Service: 07/08/2021 DOB: 04-23-1950 ? ?CC:Hart, Melissa, NP   ? ?REFERRING PHYSICIAN: Debbrah Alar, NP ? ? 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 ? ? ?HISTORY OF PRESENT ILLNESS: Ian Hart is a 71 y.o. male with a diagnosis of lung cancer.  The patient was referred in November 2021 to Dr. Valeta Harms in pulmonary medicine for an abnormality on screening CT scan.  The patient had the scan on 02/15/2020 revealing a spiculated 13.6 mm nodule in the right upper lobe, and he subsequently underwent a PET scan on 03/01/2020 which revealed hypermetabolism within the right upper lobe nodule as well as an isolated hypermetabolic subcarinal lymph nodes suspicious for nodal disease.  No other extrathoracic hypermetabolic metastases were identified, a left renal lesion which was indeterminate but hypermetabolic was also seen and possibly suspicious for renal cell carcinoma.  He underwent a CT super D scan on 03/22/2020 and subsequent bronchoscopy.  Interestingly bronchoscopy confirms adenocarcinoma with in the lung nodule and small cell carcinoma within the 7 station lymph node.  He proceeded with chemoRT between 04/17/20 and 06/01/20. He also proceeded with prophylactic  cranial irradiation to reduce risks of brain disease. 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 ? ?He has been without disease since his treatment, and recent MRI on 07/05/21 was negative for brain disease but did show chronic bilateral mastoid opacification. He's contacted by phone to review his results.  ? ?On review of systems, he reports he is doing well overall. He has had muffled hearing and was told by his PCP it could be related to his radiation. He denies any headaches, difficulty with speech, weakness, or movement. No other complaints are verbalized.  ? ?PREVIOUS RADIATION THERAPY:  ? ?08/20/2020 through 08/31/2020 ?Site Technique Total Dose (Gy) Dose per Fx (Gy) Completed Fx Beam Energies  ?Brain: Brain Complex 25/25 2.5 10/10 6X  ? ? ? ?Radiation Treatment Dates: 04/17/2020 through 06/01/2020 ?Site Technique Total Dose (Gy) Dose per Fx (Gy) Completed Fx Beam Energies  ?Lung, Right: Lung_Rt 3D 60/60 2 30/30 6X, 10X  ?Lung, Right: Lung_Rt_Bst 3D 6/6 2 3/3 6X, 10X  ? ? ? ?PAST MEDICAL HISTORY:  ?Past Medical History:  ?Diagnosis Date  ? History of chicken pox   ? History of shingles 04/2010  ? Lung cancer ([place]) 02/2020  ?   ? ? ?PAST SURGICAL HISTORY: ?Past Surgical History:  ?Procedure Laterality Date  ? BRONCHIAL BIOPSY  03/22/2020  ? Procedure: BRONCHIAL BIOPSIES;  Surgeon: Garner Nash, DO;  Location: [facility];  Service: Pulmonary;;  ? BRONCHIAL BRUSHINGS  03/22/2020  ? Procedure: BRONCHIAL BRUSHINGS;  Surgeon: Garner Nash, DO;  Location: [facility];  Service: Pulmonary;;  ? BRONCHIAL NEEDLE ASPIRATION BIOPSY  03/22/2020  ? Procedure: BRONCHIAL NEEDLE ASPIRATION BIOPSIES;  Surgeon: Garner Nash, DO;  Location: [facility];  Service: Pulmonary;;  ? BRONCHIAL WASHINGS  03/22/2020  ? Procedure: BRONCHIAL WASHINGS;  Surgeon: Garner Nash, DO;  Location: [facility];  Service: Pulmonary;;  ? FIDUCIAL MARKER PLACEMENT  03/22/2020  ?  Procedure: FIDUCIAL MARKER PLACEMENT;  Surgeon: Garner Nash, DO;  Location: [facility];  Service: Pulmonary;;  ? NO PAST SURGERIES    ? VIDEO BRONCHOSCOPY WITH ENDOBRONCHIAL NAVIGATION N/A 03/22/2020  ? Procedure: VIDEO BRONCHOSCOPY WITH ENDOBRONCHIAL NAVIGATION;  Surgeon: Garner Nash, DO;  Location: [facility];  Service: Pulmonary;  Laterality: N/A;  ? VIDEO BRONCHOSCOPY WITH ENDOBRONCHIAL ULTRASOUND N/A 03/22/2020  ? Procedure: VIDEO BRONCHOSCOPY WITH ENDOBRONCHIAL ULTRASOUND;  Surgeon: Garner Nash, DO;  Location: [facility];  Service: Pulmonary;  Laterality: N/A;  ? ? ? ?FAMILY HISTORY:  ?Family History  ?Problem Relation Age of Onset  ? Sudden death Mother   ? Rheumatic fever Mother   ? Hypertension Neg Hx   ? Hyperlipidemia Neg Hx   ? Heart attack Neg Hx   ? Diabetes Neg Hx   ? Colon cancer Neg Hx   ? Stomach cancer Neg Hx   ? ? ? ?SOCIAL HISTORY:  reports that he has quit smoking. His smoking use included cigarettes. He has a 100.00 pack-year smoking history. He has never used smokeless tobacco. He reports current alcohol use of about 5.0 standard drinks per week. He reports that he does not use drugs. The patient is married and lives in [place].  ? ? ?ALLERGIES: Acyclovir and related ? ? ?MEDICATIONS:  ?Current Outpatient Medications  ?Medication Sig Dispense Refill  ? ascorbic acid (VITAMIN C) 500 MG tablet Take 500 mg by mouth daily.    ? Multiple Vitamin (MULTIVITAMIN) tablet Take 1 tablet by mouth daily.    ? tamsulosin (FLOMAX) 0.4 MG CAPS capsule Take 0.4 mg by mouth daily.    ? vitamin B-12 (CYANOCOBALAMIN) 1000 MCG tablet Take 1,000 mcg by mouth daily.    ? ?No current facility-administered medications for this encounter.  ? ? ? ?PHYSICAL EXAM:  ?Unable to assess due to encounter type. ? ?ECOG = 1 ? ?0 - Asymptomatic (Fully active, able to carry on all predisease activities without restriction) ? ?1 - Symptomatic but completely ambulatory (Restricted in physically strenuous  activity but ambulatory and able to carry out work of a light or sedentary nature. For example, light housework, office work) ? ?2 - Symptomatic, <50% in bed during the day (Ambulatory and capable of all self care but unable to carry out any work activities. Up and about more than 50% of waking hours) ? ?3 - Symptomatic, >50% in bed, but not bedbound (Capable of only limited self-care, confined to bed or chair 50% or more of waking hours) ? ?4 - Bedbound (Completely disabled. Cannot carry on any self-care. Totally confined to bed or chair) ? ?5 - Death ? ? Oken MM, Creech RH, Tormey DC, et al. [phone]). "Toxicity and response criteria of the [facility]". [place]eline Oncol. 5 (6): 649-55 ? ? ? ?LABORATORY DATA:  ?Lab Results  ?Component Value Date  ? WBC 5.2 06/04/2021  ? HGB 12.0 (L) 06/04/2021  ? HCT 35.1 (L) 06/04/2021  ? MCV 95.9 06/04/2021  ? PLT 192 06/04/2021  ? ?Lab Results  ?Component Value Date  ? NA 130 (L) 06/04/2021  ? K 4.4 06/04/2021  ? CL 97 (L)  06/04/2021  ? CO2 27 06/04/2021  ? ?Lab Results  ?Component Value Date  ? ALT 15 06/04/2021  ? AST 21 06/04/2021  ? ALKPHOS 68 06/04/2021  ? BILITOT 0.4 06/04/2021  ? ?  ? ?RADIOGRAPHY: MR Brain W Wo Contrast ? ?Result Date: 07/07/2021 ?CLINICAL DATA:  Brain metastases, assess treatment response. EXAM: MRI HEAD WITHOUT AND WITH CONTRAST TECHNIQUE: Multiplanar, multiecho pulse sequences of the brain and surrounding structures were obtained without and with intravenous contrast. CONTRAST:  64mL GADAVIST GADOBUTROL 1 MMOL/ML IV SOLN COMPARISON:  03/28/2021 FINDINGS: Brain: No enhancement or swelling to suggest metastatic disease. Generalized brain atrophy. No acute or subacute infarct, hemorrhage, hydrocephalus, or collection. Chronic susceptibility at the frontal horn of the left lateral ventricle. Vascular: Normal flow voids. Skull and upper cervical spine: Negative for marrow lesion. Sinuses/Orbits: Chronic bilateral mastoid opacification.  IMPRESSION: Negative for metastatic disease. Electronically Signed   By: Jorje Guild M.D.   On: 07/07/2021 12:45   ? ?   ? ?IMPRESSION/PLAN: ?1. 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

## 2021-08-13 DIAGNOSIS — N2889 Other specified disorders of kidney and ureter: Secondary | ICD-10-CM | POA: Diagnosis not present

## 2021-08-13 DIAGNOSIS — C642 Malignant neoplasm of left kidney, except renal pelvis: Secondary | ICD-10-CM | POA: Diagnosis not present

## 2021-09-03 ENCOUNTER — Inpatient Hospital Stay: Payer: BC Managed Care – PPO | Attending: Internal Medicine

## 2021-09-03 ENCOUNTER — Ambulatory Visit (HOSPITAL_COMMUNITY)
Admission: RE | Admit: 2021-09-03 | Discharge: 2021-09-03 | Disposition: A | Discharge status: Home / Self Care | Payer: BC Managed Care – PPO | Source: Ambulatory Visit | Attending: Internal Medicine | Admitting: Internal Medicine

## 2021-09-03 ENCOUNTER — Other Ambulatory Visit: Payer: Self-pay

## 2021-09-03 DIAGNOSIS — C642 Malignant neoplasm of left kidney, except renal pelvis: Secondary | ICD-10-CM | POA: Diagnosis not present

## 2021-09-03 DIAGNOSIS — C3411 Malignant neoplasm of upper lobe, right bronchus or lung: Secondary | ICD-10-CM | POA: Insufficient documentation

## 2021-09-03 DIAGNOSIS — C3490 Malignant neoplasm of unspecified part of unspecified bronchus or lung: Secondary | ICD-10-CM | POA: Insufficient documentation

## 2021-09-03 DIAGNOSIS — I700 Atherosclerosis of aorta: Secondary | ICD-10-CM | POA: Diagnosis not present

## 2021-09-03 LAB — CBC WITH DIFFERENTIAL (CANCER CENTER ONLY)
Abs Immature Granulocytes: 0.01 10*3/uL (ref 0.00–0.07)
Basophils Absolute: 0 10*3/uL (ref 0.0–0.1)
Basophils Relative: 1 %
Eosinophils Absolute: 0.1 10*3/uL (ref 0.0–0.5)
Eosinophils Relative: 3 %
HCT: 35.6 % — ABNORMAL LOW (ref 39.0–52.0)
Hemoglobin: 12.5 g/dL — ABNORMAL LOW (ref 13.0–17.0)
Immature Granulocytes: 0 %
Lymphocytes Relative: 15 %
Lymphs Abs: 0.8 10*3/uL (ref 0.7–4.0)
MCH: 33.3 pg (ref 26.0–34.0)
MCHC: 35.1 g/dL (ref 30.0–36.0)
MCV: 94.9 fL (ref 80.0–100.0)
Monocytes Absolute: 0.7 10*3/uL (ref 0.1–1.0)
Monocytes Relative: 12 %
Neutro Abs: 3.9 10*3/uL (ref 1.7–7.7)
Neutrophils Relative %: 69 %
Platelet Count: 194 10*3/uL (ref 150–400)
RBC: 3.75 MIL/uL — ABNORMAL LOW (ref 4.22–5.81)
RDW: 11.9 % (ref 11.5–15.5)
WBC Count: 5.6 10*3/uL (ref 4.0–10.5)
nRBC: 0 % (ref 0.0–0.2)

## 2021-09-03 LAB — CMP (CANCER CENTER ONLY)
ALT: 14 U/L (ref 0–44)
AST: 21 U/L (ref 15–41)
Albumin: 4.5 g/dL (ref 3.5–5.0)
Alkaline Phosphatase: 71 U/L (ref 38–126)
Anion gap: 6 (ref 5–15)
BUN: 13 mg/dL (ref 8–23)
CO2: 28 mmol/L (ref 22–32)
Calcium: 9.4 mg/dL (ref 8.9–10.3)
Chloride: 97 mmol/L — ABNORMAL LOW (ref 98–111)
Creatinine: 1.11 mg/dL (ref 0.61–1.24)
GFR, Estimated: >60 mL/min (ref >60)
Glucose, Bld: 86 mg/dL (ref 70–99)
Potassium: 4.3 mmol/L (ref 3.5–5.1)
Sodium: 131 mmol/L — ABNORMAL LOW (ref 135–145)
Total Bilirubin: 0.5 mg/dL (ref 0.3–1.2)
Total Protein: 7.4 g/dL (ref 6.5–8.1)

## 2021-09-03 MED ORDER — SODIUM CHLORIDE (PF) 0.9 % IJ SOLN
INTRAMUSCULAR | Status: AC
  Filled 2021-09-03: qty 50

## 2021-09-03 MED ORDER — IOHEXOL 300 MG/ML  SOLN
75.0000 mL | Freq: Once | INTRAMUSCULAR | Status: AC | PRN
  Administered 2021-09-03: 75 mL via INTRAVENOUS

## 2021-09-05 ENCOUNTER — Other Ambulatory Visit: Payer: Self-pay

## 2021-09-05 ENCOUNTER — Inpatient Hospital Stay (HOSPITAL_BASED_OUTPATIENT_CLINIC_OR_DEPARTMENT_OTHER): Payer: BC Managed Care – PPO | Admitting: Internal Medicine

## 2021-09-05 VITALS — BP 111/57 | HR 78 | Temp 96.1°F | Resp 17 | Wt 138.0 lb

## 2021-09-05 DIAGNOSIS — C3490 Malignant neoplasm of unspecified part of unspecified bronchus or lung: Secondary | ICD-10-CM | POA: Diagnosis not present

## 2021-09-05 DIAGNOSIS — C642 Malignant neoplasm of left kidney, except renal pelvis: Secondary | ICD-10-CM | POA: Diagnosis not present

## 2021-09-05 DIAGNOSIS — C3411 Malignant neoplasm of upper lobe, right bronchus or lung: Secondary | ICD-10-CM | POA: Diagnosis not present

## 2021-09-05 NOTE — Progress Notes (Signed)
[facility] Telephone:([phone]) [phone]   Fax:(336) [phone]  OFFICE PROGRESS NOTE  [place]bra[place] Alar, NP [place] [address]  DIAGNOSIS: 1) Poorly differentiated carcinoma, most consistent with adenocarcinoma in the right upper lobe lung nodule 2) small cell lung cancer station 7 lymph node diagnosed in December 2021. 3) Suspicious left renal lesion   PRIOR THERAPY: Systemic chemotherapy/radiation with cisplatin 80 mg per metered squared on day 1 and etoposide 100 mg per metered squared on days 1, 2, and 3 IV every 3 weeks.  Status post 4 cycles.  Starting from cycle #3, cisplatin was changed to carboplatin for an AUC of 5 due to renal insufficiency. First dose on 04/20/20. Onpro was added to the treatment plan starting from cycle #3 due to pancytopenia.     CURRENT THERAPY:  Observation  INTERVAL HISTORY: CORMICK MOSS 71 y.o. male returns to the clinic today for follow-up visit accompanied by his wife.  The patient is feeling fine today with no concerning complaints.  He denied having any chest pain, shortness of breath, cough or hemoptysis.  He denied having any fever or chills.  He has no nausea, vomiting, diarrhea or constipation.  He has no headache or visual changes.  He has no weight loss or night sweats.  He is here today for evaluation with repeat CT scan of the chest for restaging of his disease.  MEDICAL HISTORY: Past Medical History:  Diagnosis Date   History of chicken pox    History of shingles 04/2010   Lung cancer ([place]) 02/2020    ALLERGIES:  is allergic to acyclovir and related.  MEDICATIONS:  Current Outpatient Medications  Medication Sig Dispense Refill   ascorbic acid (VITAMIN C) 500 MG tablet Take 500 mg by mouth daily.     methylPREDNISolone (MEDROL) 4 MG tablet Take 6 tabs po day 1, 5 tabs po day 2, 4 tabs po day 3, 3 tabs po day 4, 2 tabs po day 5, 1 tab po day 6 then stop 21 tablet 0   Multiple Vitamin  (MULTIVITAMIN) tablet Take 1 tablet by mouth daily.     tamsulosin (FLOMAX) 0.4 MG CAPS capsule Take 0.4 mg by mouth daily.     vitamin B-12 (CYANOCOBALAMIN) 1000 MCG tablet Take 1,000 mcg by mouth daily.     No current facility-administered medications for this visit.    SURGICAL HISTORY:  Past Surgical History:  Procedure Laterality Date   BRONCHIAL BIOPSY  03/22/2020   Procedure: BRONCHIAL BIOPSIES;  Surgeon: Garner Nash, DO;  Location: [facility];  Service: Pulmonary;;   BRONCHIAL BRUSHINGS  03/22/2020   Procedure: BRONCHIAL BRUSHINGS;  Surgeon: Garner Nash, DO;  Location: [facility];  Service: Pulmonary;;   BRONCHIAL NEEDLE ASPIRATION BIOPSY  03/22/2020   Procedure: BRONCHIAL NEEDLE ASPIRATION BIOPSIES;  Surgeon: Garner Nash, DO;  Location: [facility];  Service: Pulmonary;;   BRONCHIAL WASHINGS  03/22/2020   Procedure: BRONCHIAL WASHINGS;  Surgeon: Garner Nash, DO;  Location: [facility];  Service: Pulmonary;;   FIDUCIAL MARKER PLACEMENT  03/22/2020   Procedure: FIDUCIAL MARKER PLACEMENT;  Surgeon: Garner Nash, DO;  Location: [facility];  Service: Pulmonary;;   NO PAST SURGERIES     VIDEO BRONCHOSCOPY WITH ENDOBRONCHIAL NAVIGATION N/A 03/22/2020   Procedure: VIDEO BRONCHOSCOPY WITH ENDOBRONCHIAL NAVIGATION;  Surgeon: Garner Nash, DO;  Location: [facility];  Service: Pulmonary;  Laterality: N/A;   VIDEO BRONCHOSCOPY WITH ENDOBRONCHIAL ULTRASOUND  N/A 03/22/2020   Procedure: VIDEO BRONCHOSCOPY WITH ENDOBRONCHIAL ULTRASOUND;  Surgeon: Garner Nash, DO;  Location: [facility];  Service: Pulmonary;  Laterality: N/A;    REVIEW OF SYSTEMS:  A comprehensive review of systems was negative.   PHYSICAL EXAMINATION: General appearance: alert, cooperative, and no distress Head: Normocephalic, without obvious abnormality, atraumatic Neck: no adenopathy, no JVD, supple, symmetrical, trachea midline, and thyroid not enlarged, symmetric, no  tenderness/mass/nodules Lymph nodes: Cervical, supraclavicular, and axillary nodes normal. Resp: clear to auscultation bilaterally Back: symmetric, no curvature. ROM normal. No CVA tenderness. Cardio: regular rate and rhythm, S1, S2 normal, no murmur, click, rub or gallop GI: soft, non-tender; bowel sounds normal; no masses,  no organomegaly Extremities: extremities normal, atraumatic, no cyanosis or edema  ECOG PERFORMANCE STATUS: 1 - Symptomatic but completely ambulatory  Blood pressure (!) 111/57, pulse 78, temperature (!) 96.1 F (35.6 C), temperature source Tympanic, resp. rate 17, weight 138 lb (62.6 kg), SpO2 98 %.  LABORATORY DATA: Lab Results  Component Value Date   WBC 5.6 09/03/2021   HGB 12.5 (L) 09/03/2021   HCT 35.6 (L) 09/03/2021   MCV 94.9 09/03/2021   PLT 194 09/03/2021      Chemistry      Component Value Date/Time   NA 131 (L) 09/03/2021 1523   K 4.3 09/03/2021 1523   CL 97 (L) 09/03/2021 1523   CO2 28 09/03/2021 1523   BUN 13 09/03/2021 1523   CREATININE 1.11 09/03/2021 1523   CREATININE 0.86 11/12/2011 0917      Component Value Date/Time   CALCIUM 9.4 09/03/2021 1523   ALKPHOS 71 09/03/2021 1523   AST 21 09/03/2021 1523   ALT 14 09/03/2021 1523   BILITOT 0.5 09/03/2021 1523       RADIOGRAPHIC STUDIES: CT Chest W Contrast  Result Date: 09/04/2021 CLINICAL DATA:  Primary Cancer Type: Lung Imaging Indication: Routine surveillance Interval therapy since last imaging? No Initial Cancer Diagnosis Date: 03/22/2020 Established by: Biopsy-proven Detailed Pathology: Poorly differentiated carcinoma, most consistent with adenocarcinoma in the right upper lobe lung nodule; small cell lung cancer station 7 lymph node. Primary Tumor location:  Right upper lobe. Surgeries: No thoracic. 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 Chemotherapy: Yes; Ongoing? No; Most recent administration: 06/25/2020 Immunotherapy? No Radiation therapy? Yes  Date Range: 08/20/2020 - 08/31/2020; Target: Brain Date Range: 04/17/2020 - 06/01/2020; Target: Right lung * Tracking Code: BO * EXAM: CT CHEST WITH CONTRAST TECHNIQUE: Multidetector CT imaging of the chest was performed during intravenous contrast administration. RADIATION DOSE REDUCTION: This exam was performed according to the departmental dose-optimization program which includes automated exposure control, adjustment of the mA and/or kV according to patient size and/or use of iterative reconstruction technique. CONTRAST:  89mL OMNIPAQUE IOHEXOL 300 MG/ML  SOLN COMPARISON:  Most recent CT chest 06/04/2021. 03/01/2020 PET-CT. FINDINGS: Cardiovascular: Heart size appears within normal limits. No pericardial effusion. Aortic atherosclerosis and coronary artery calcifications. Mediastinum/Nodes: No enlarged mediastinal, hilar, or axillary lymph nodes. Thyroid gland, trachea, and esophagus demonstrate no significant findings. Lungs/Pleura: No pleural effusion. Paraseptal and centrilobular emphysema. The previously noted filling defect along the bronchus intermedius is no longer visualized and is favored to represent retained secretions. Post treatment changes within the perihilar and paramediastinal right upper lobe. Small nodular density adjacent to right upper lobe fiducial markers measures 1 x 0.5 cm, image 52/5. Previously this was reported at 1.1 x 0.5 cm. No new/suspicious pulmonary nodules or mass identified bilaterally. Upper Abdomen: Unchanged appearance of small  cyst within segment 4B of the liver measuring 1 cm, image 176/2. Aortic atherosclerosis. 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 This is compatible with a benign abnormality. No follow-up recommended. Musculoskeletal: No chest wall abnormality. No acute or significant osseous findings. IMPRESSION: 1. Stable post treatment changes within the perihilar and paramediastinal right upper lobe.  Previously referenced small nodular density adjacent to right upper lobe fiducial markers is stable to slightly decreased in size from previous exam. 2. Previous endobronchial filling defect within the bronchus intermedius has resolved compatible with retained secretions. 3. Coronary artery calcifications. 4. Aortic Atherosclerosis (ICD10-I70.0) and Emphysema (ICD10-J43.9). Electronically Signed   By: Kerby Moors M.D.   On: 09/04/2021 11:22    ASSESSMENT AND PLAN: This is a very pleasant 71 years old white male diagnosed with poorly differentiated carcinoma consistent with adenocarcinoma in the right upper lobe.  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  The patient also has a suspicious left renal lesion concerning for renal cell carcinoma. He is status post a course of systemic chemotherapy initially with cisplatin and etoposide but the cisplatin was discontinued secondary to renal insufficiency and the patient continued 3 more cycles of his systemic chemotherapy with carboplatin and etoposide concurrent with radiation.  He has a rough time with the treatment including pancytopenia as well as fatigue and weight loss. The patient is currently on observation and he is feeling fine with no concerning complaints. He had repeat CT scan of the chest performed recently.  I personally and independently reviewed the scan and discussed the results with the patient and his wife. His scan showed no concerning findings for disease progression. I recommended for the patient to continue on observation with repeat CT scan of the chest in 6 months. 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 The patient was advised to call immediately if he has any other concerning symptoms in the interval. The patient voices  understanding of current disease status and treatment options and is in agreement with the current care plan.  All questions were answered. The patient knows to call the clinic with any problems, questions or concerns. We can certainly see the patient much sooner if necessary.   Disclaimer: This note was dictated with voice recognition software. Similar sounding words can inadvertently be transcribed and may not be corrected upon review.

## 2021-10-02 ENCOUNTER — Telehealth: Payer: Self-pay | Admitting: Radiation Therapy

## 2021-10-02 ENCOUNTER — Other Ambulatory Visit: Payer: Self-pay | Admitting: Radiation Therapy

## 2021-10-02 DIAGNOSIS — C3490 Malignant neoplasm of unspecified part of unspecified bronchus or lung: Secondary | ICD-10-CM

## 2021-10-02 NOTE — Telephone Encounter (Signed)
Spoke with Ian Hart about the brain MRI and telephone follow-up scheduled for him in July.   Mont Dutton R.T.(R)(T) Radiation Special Procedures Navigator

## 2021-10-04 ENCOUNTER — Other Ambulatory Visit: Payer: Self-pay | Admitting: Physician Assistant

## 2021-10-28 ENCOUNTER — Ambulatory Visit: Payer: Self-pay | Admitting: Radiation Oncology

## 2021-11-01 ENCOUNTER — Ambulatory Visit (HOSPITAL_COMMUNITY)
Admission: RE | Admit: 2021-11-01 | Discharge: 2021-11-01 | Disposition: A | Discharge status: Home / Self Care | Payer: BC Managed Care – PPO | Source: Ambulatory Visit | Attending: Radiation Oncology | Admitting: Radiation Oncology

## 2021-11-01 DIAGNOSIS — G319 Degenerative disease of nervous system, unspecified: Secondary | ICD-10-CM | POA: Diagnosis not present

## 2021-11-01 DIAGNOSIS — C3490 Malignant neoplasm of unspecified part of unspecified bronchus or lung: Secondary | ICD-10-CM | POA: Insufficient documentation

## 2021-11-01 MED ORDER — GADOBUTROL 1 MMOL/ML IV SOLN
6.0000 mL | Freq: Once | INTRAVENOUS | Status: AC | PRN
  Administered 2021-11-01: 6 mL via INTRAVENOUS

## 2021-11-04 ENCOUNTER — Inpatient Hospital Stay: Payer: BC Managed Care – PPO | Attending: Internal Medicine

## 2021-11-04 ENCOUNTER — Encounter: Payer: Self-pay | Admitting: Radiation Oncology

## 2021-11-04 ENCOUNTER — Ambulatory Visit
Admission: RE | Admit: 2021-11-04 | Discharge: 2021-11-04 | Disposition: A | Discharge status: Home / Self Care | Payer: Medicare Other | Source: Ambulatory Visit | Attending: Radiation Oncology | Admitting: Radiation Oncology

## 2021-11-04 DIAGNOSIS — Z87891 Personal history of nicotine dependence: Secondary | ICD-10-CM | POA: Diagnosis not present

## 2021-11-04 DIAGNOSIS — C3491 Malignant neoplasm of unspecified part of right bronchus or lung: Secondary | ICD-10-CM

## 2021-11-04 DIAGNOSIS — C3411 Malignant neoplasm of upper lobe, right bronchus or lung: Secondary | ICD-10-CM | POA: Diagnosis not present

## 2021-11-04 NOTE — Progress Notes (Addendum)
Telephone appointment. I verified patient's identity and began nursing interview. Patient reports some hearing difficulties w/ steroids being of little to no help in alleviating any symptoms. No lung issues reported at this time.  Meaningful use complete.  Patient's 1:00pm-11/04/21 telephone appointment w/ Shona Simpson PA-C. Has been completed.  Patient contact [phone]

## 2021-11-04 NOTE — Addendum Note (Signed)
Encounter addended by: Mollie Germany, LPN on: 10/09/3660 9:47 AM  Actions taken: Vitals modified, Problem List reviewed, Allergies reviewed, Order Reconciliation Section accessed, Flowsheet accepted, Medication List reviewed, Clinical Note Signed

## 2021-11-04 NOTE — Progress Notes (Signed)
Radiation Oncology         [phone] ________________________________   Outpatient Follow Up - Conducted via telephone at patient request.  I spoke with the patient to conduct this consult visit via telephone. The patient was notified in advance and was offered an in person or telemedicine meeting to allow for face to face communication but instead preferred to proceed with a telephone visit.     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   HISTORY OF PRESENT ILLNESS: Ian Hart is a 71 y.o. male with a diagnosis of lung cancer.  The patient was referred in November 2021 to Dr. Valeta Harms in pulmonary medicine for an abnormality on screening CT scan.  The patient had the scan on 02/15/2020 revealing a spiculated 13.6 mm nodule in the right upper lobe, and he subsequently underwent a PET scan on 03/01/2020 which revealed hypermetabolism within the right upper lobe nodule as well as an isolated hypermetabolic subcarinal lymph nodes suspicious for nodal disease.  No other extrathoracic hypermetabolic metastases were identified, a left renal lesion which was indeterminate but hypermetabolic was also seen and possibly suspicious for renal cell carcinoma.  He underwent a CT super D scan on 03/22/2020 and subsequent bronchoscopy.  Interestingly bronchoscopy confirms adenocarcinoma with in the lung nodule and small cell carcinoma within the 7 station lymph node.  He proceeded with chemoRT between 04/17/20 and 06/01/20. He also proceeded with prophylactic cranial irradiation to reduce risks of brain disease. 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  He has been without disease since his treatment, and recent MRI on 11/01/21 was negative for brain disease and again showd chronic bilateral mastoid opacification despite the use of a steroid dose pack this spring. He's contacted by phone to review his results. He continues to follow in surveillance with Dr. Julien Nordmann and his most recent restaging scan on 09/03/21 showed no concerns for active disease and he plans 6 month surveillance.  On review of systems, he reports he is doing pretty well but continues to struggle with his hearing which seems muffled. He took the steroid dose pack I gave him last time in March, but this did not seem to alleviate his symptoms. He's been seen previously in audiology, but not seen an ENT recently.  He's not having any visual, motor, or speech defecits. He denies any headaches or loss of sensation.   PREVIOUS RADIATION THERAPY:   08/20/2020 through 08/31/2020 Site Technique Total Dose (Gy) Dose per Fx (Gy) Completed Fx Beam Energies  Brain: Brain Complex 25/25 2.5 10/10 6X     Radiation Treatment Dates: 04/17/2020 through 06/01/2020 Site Technique Total Dose (Gy) Dose per Fx (Gy) Completed Fx Beam Energies  Lung, Right: Lung_Rt 3D 60/60 2 30/30 6X, 10X  Lung, Right: Lung_Rt_Bst 3D 6/6 2 3/3 6X, 10X     PAST MEDICAL HISTORY:  Past Medical History:  Diagnosis Date   History of chicken pox    History of shingles 04/2010   Lung cancer ([place]) 02/2020       PAST SURGICAL HISTORY: Past Surgical History:  Procedure Laterality Date  BRONCHIAL BIOPSY  03/22/2020   Procedure: BRONCHIAL BIOPSIES;  Surgeon: Garner Nash, DO;  Location: [facility];  Service: Pulmonary;;   BRONCHIAL BRUSHINGS  03/22/2020   Procedure: BRONCHIAL BRUSHINGS;  Surgeon: Garner Nash, DO;  Location: [facility] [facility];  Service: Pulmonary;;   BRONCHIAL NEEDLE ASPIRATION BIOPSY  03/22/2020   Procedure: BRONCHIAL NEEDLE ASPIRATION BIOPSIES;  Surgeon: Garner Nash,  DO;  Location: [place];  Service: Pulmonary;;   BRONCHIAL WASHINGS  03/22/2020   Procedure: BRONCHIAL WASHINGS;  Surgeon: Garner Nash, DO;  Location: [facility];  Service: Pulmonary;;   FIDUCIAL MARKER PLACEMENT  03/22/2020   Procedure: FIDUCIAL MARKER PLACEMENT;  Surgeon: Garner Nash, DO;  Location: [facility];  Service: Pulmonary;;   NO PAST SURGERIES     VIDEO BRONCHOSCOPY WITH ENDOBRONCHIAL NAVIGATION N/A 03/22/2020   Procedure: VIDEO BRONCHOSCOPY WITH ENDOBRONCHIAL NAVIGATION;  Surgeon: Garner Nash, DO;  Location: [place];  Service: Pulmonary;  Laterality: N/A;   VIDEO BRONCHOSCOPY WITH ENDOBRONCHIAL ULTRASOUND N/A 03/22/2020   Procedure: VIDEO BRONCHOSCOPY WITH ENDOBRONCHIAL ULTRASOUND;  Surgeon: Garner Nash, DO;  Location: [facility];  Service: Pulmonary;  Laterality: N/A;     FAMILY HISTORY:  Family History  Problem Relation Age of Onset   Sudden death Mother    Rheumatic fever Mother    Hypertension Neg Hx    Hyperlipidemia Neg Hx    Heart attack Neg Hx    Diabetes Neg Hx    Colon cancer Neg Hx    Stomach cancer Neg Hx      SOCIAL HISTORY:  reports that he has quit smoking. His smoking use included cigarettes. He has a 100.00 pack-year smoking history. He has never used smokeless tobacco. He reports current alcohol use of about 5.0 standard drinks of alcohol per week. He reports that he does not use drugs. The patient is married and lives in [place].    ALLERGIES: Acyclovir and related   MEDICATIONS:  Current Outpatient Medications  Medication Sig Dispense Refill   ascorbic acid (VITAMIN C) 500 MG tablet Take 500 mg by mouth daily.     methylPREDNISolone (MEDROL) 4 MG tablet Take 6 tabs po day 1, 5 tabs po day 2, 4 tabs po day 3, 3 tabs po day 4, 2 tabs po day 5, 1 tab po day 6 then stop 21 tablet 0   Multiple Vitamin (MULTIVITAMIN) tablet Take 1 tablet by mouth daily.     tamsulosin (FLOMAX) 0.4 MG CAPS capsule Take 0.4 mg by mouth  daily.     vitamin B-12 (CYANOCOBALAMIN) 1000 MCG tablet Take 1,000 mcg by mouth daily.     No current facility-administered medications for this encounter.     PHYSICAL EXAM:  Unable to assess due to encounter type.  ECOG = 1  0 - Asymptomatic (Fully active, able to carry on all predisease activities without restriction)  1 - Symptomatic but completely ambulatory (Restricted in physically strenuous activity but ambulatory and able to carry out work of a light or sedentary nature. For example, light housework, office work)  2 - Symptomatic, <50% in bed during the day (Ambulatory and capable of all self care but unable to carry out any work activities. Up and about more than 50% of waking hours)  3 - Symptomatic, >50% in bed, but not bedbound (Capable of only limited self-care, confined to bed or chair 50% or more of waking hours)  4 - Bedbound (Completely disabled. Cannot carry on any self-care. Totally confined to  bed or chair)  5 - Death   Eustace Pen MM, Creech RH, Tormey DC, et al. [phone]). "Toxicity and response criteria of the [facility] Group". [place] Oncol. 5 (6): 649-55    LABORATORY DATA:  Lab Results  Component Value Date   WBC 5.6 09/03/2021   HGB 12.5 (L) 09/03/2021   HCT 35.6 (L) 09/03/2021   MCV 94.9 09/03/2021   PLT 194 09/03/2021   Lab Results  Component Value Date   NA 131 (L) 09/03/2021   K 4.3 09/03/2021   CL 97 (L) 09/03/2021   CO2 28 09/03/2021   Lab Results  Component Value Date   ALT 14 09/03/2021   AST 21 09/03/2021   ALKPHOS 71 09/03/2021   BILITOT 0.5 09/03/2021      RADIOGRAPHY: MR Brain W Wo Contrast  Result Date: 11/01/2021 CLINICAL DATA:  Provided history: Small cell lung cancer. Small cell lung cancer, monitor. Completed PCI 08/31/20. EXAM: MRI HEAD WITHOUT AND WITH CONTRAST TECHNIQUE: Multiplanar, multiecho pulse sequences of the brain and surrounding structures were obtained without and with intravenous contrast.  CONTRAST:  48mL GADAVIST GADOBUTROL 1 MMOL/ML IV SOLN COMPARISON:  Prior brain MRI examinations 07/05/2021 and earlier. FINDINGS: Brain: Mild to moderate generalized cerebral atrophy. Comparatively mild cerebellar atrophy. Mild multifocal T2 FLAIR hyperintense signal abnormality within the cerebral white matter (predominately periventricular), nonspecific but compatible chronic small vessel ischemic disease. Redemonstrated small chronic focus of susceptibility weighted signal loss along the frontal horn of the left lateral ventricle. There is no acute infarct. No evidence of an intracranial mass. No extra-axial fluid collection. No midline shift. No pathologic intracranial enhancement identified. Vascular: Maintained flow voids within the proximal large arterial vessels. Skull and upper cervical spine: No focal suspicious marrow lesion. Sinuses/Orbits: No mass or acute finding within the imaged orbits. No significant paranasal sinus disease. Other: Bilateral mastoid effusions. IMPRESSION: No evidence of intracranial metastatic disease. Parenchymal atrophy and mild chronic small vessel ischemic disease, stable. Electronically Signed   By: Kellie Simmering D.O.   On: 11/01/2021 12:33        IMPRESSION/PLAN: 1. 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 He his doing well clinically and radiographically. We discussed the rationale to extent his imaging interval since he is in surveillance from his lung cancer. We agreed on 6 month intervals as going for scans is very stressfull for him.  He was given precautions for when to call, and he will contact us if he has questions or concerns prior to that visit. 2. 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 He continues to follow up with [facility] in Urology in surveillance. We will follow this expectantly. 3. Bilateral mastoid effusions with hearing loss.  Since his symptoms haven't resolved, I think it's reasonable to proceed with formal ENT evaluation and he is in agreement with this plan.   This encounter was conducted via telephone.  The patient has provided two factor identification and has given verbal consent for this type of encounter and has been advised to only accept a meeting of this type in a secure network environment. The time spent during this encounter was 35 minutes including preparation, discussion, and coordination of the patient's care. The attendants for this meeting include Hayden Pedro  and Chad Cordial.  During the encounter, Hayden Pedro were located at [facility] Radiation Oncology Department.  LLEYTON BYERS was located at home.     The above documentation reflects my direct findings during this shared patient visit. Please see the separate note by Dr. Lisbeth Renshaw on this date for the remainder of the patient's plan of care.    Carola Rhine, PAC

## 2021-12-05 ENCOUNTER — Telehealth: Payer: Self-pay | Admitting: Internal Medicine

## 2021-12-05 NOTE — Telephone Encounter (Signed)
Called patient regarding upcoming November appointments, patient is notified. 

## 2022-01-12 IMAGING — MR MR HEAD WO/W CM
11 of 14 series · 22 of 48 positions shown · IV contrast (gadavist)
Comparison: 08/11/2020 in

CLINICAL DATA: Neoplasm surveillance

EXAM:
MRI HEAD WITHOUT AND WITH CONTRAST
TECHNIQUE: Multiplanar, multiecho pulse sequences of the brain and surrounding
structures were obtained without and with intravenous contrast.
CONTRAST:  6mL GADAVIST GADOBUTROL 1 MMOL/ML IV SOLN

[Series 2: FLAIR · sagittal · 3.0mm · 0.59mm/px · 1 of 49 slices shown (1 of 2)]
[im 1/49]
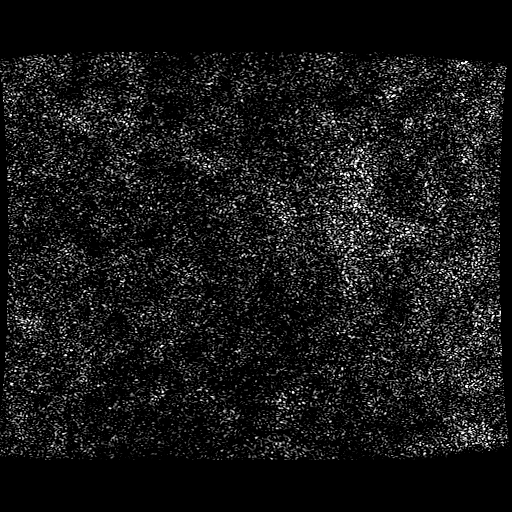

[Series 3: DWI · axial · 3.0mm · 0.94mm/px · z∈[-34,+141]mm · 3 of 122 slices shown]
[im 1/122]
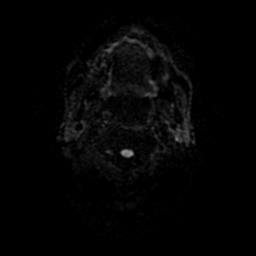
[im 61/122]
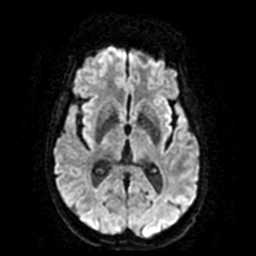
[im 122/122]
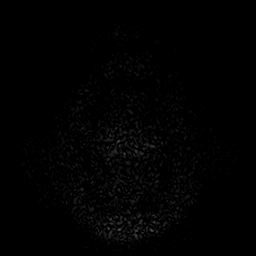

[Series 4: FLAIR · axial · 3.0mm · 0.51mm/px · 1 of 65 slices shown (2 of 2)]
[im 1/65]
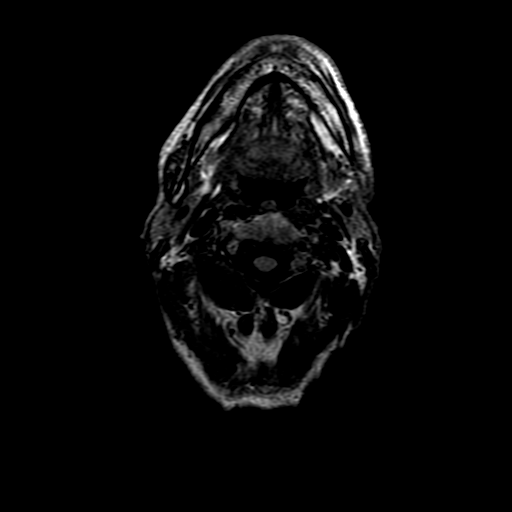

[Series 5: SWI · axial · 3.0mm · 0.51mm/px · z∈[-39,+142]mm · 3 of 124 slices shown (1 of 2)]
[im 1/124]
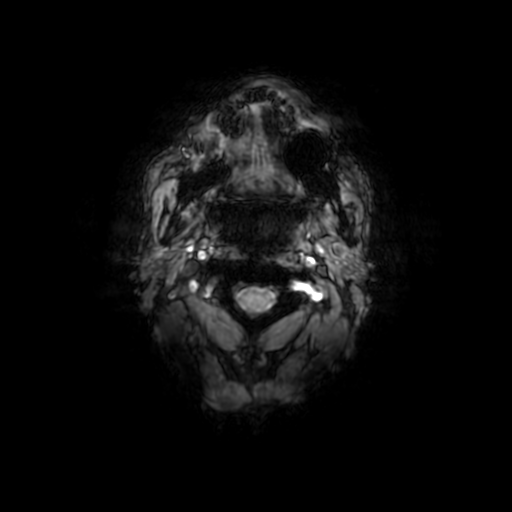
[im 62/124]
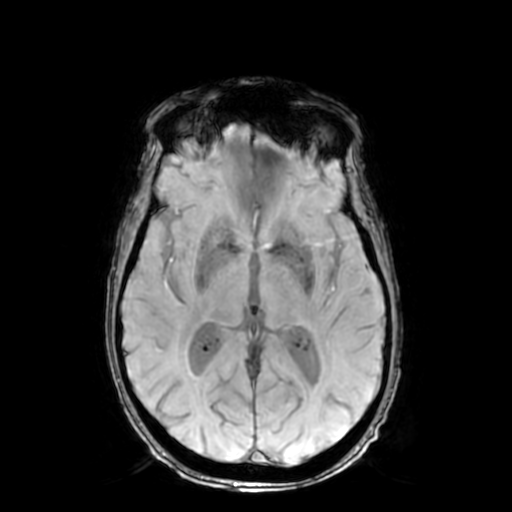
[im 124/124]
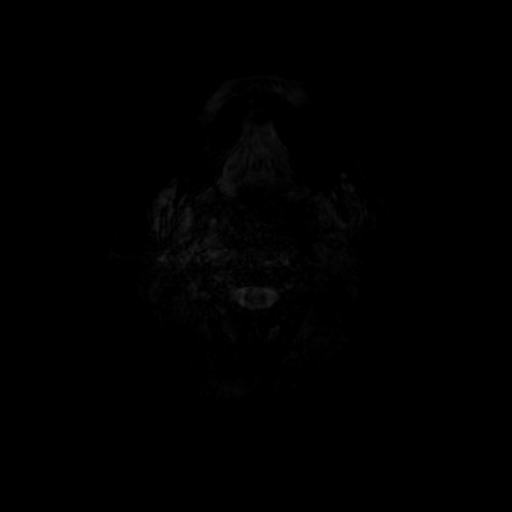

[Series 8: T2 post-contrast · coronal · 3.0mm · 0.39mm/px · 1 of 61 slices shown (1 of 2)]
[im 1/61]
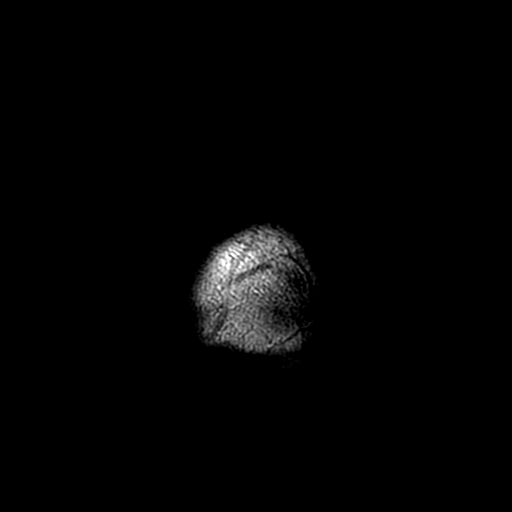

[Series 9: T2 post-contrast · axial · 5.0mm · 0.49mm/px · 1 of 33 slices shown (2 of 2)]
[im 1/33]
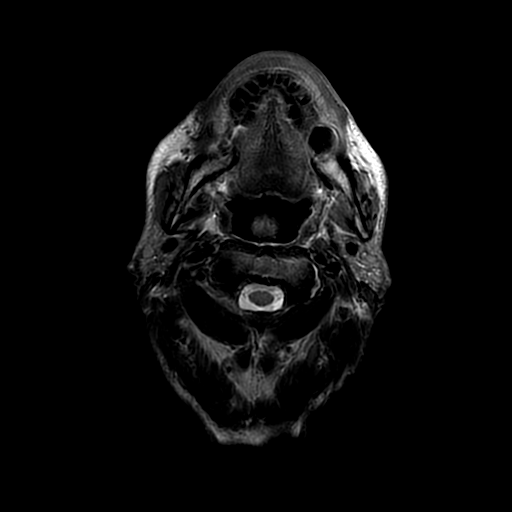

[Series 10: T1 post-contrast · coronal · 3.0mm · 0.39mm/px · 1 of 60 slices shown (1 of 2)]
[im 1/60]
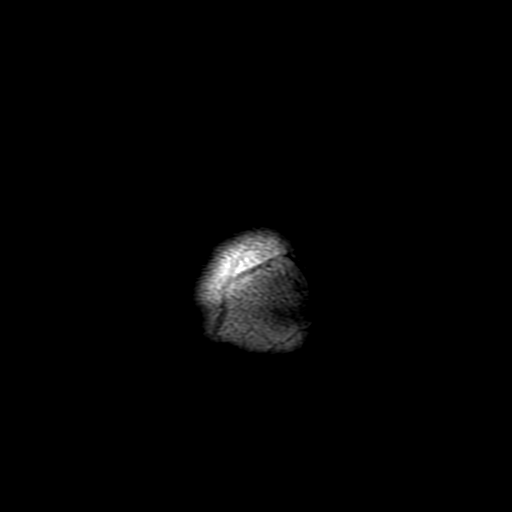

[Series 11: FLAIR post-contrast · sagittal · 3.0mm · 0.59mm/px · 1 of 49 slices shown]
[im 1/49]
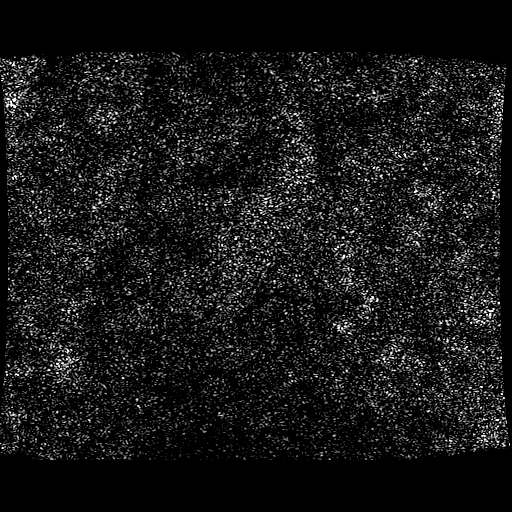

[Series 350: ADC · axial · 3.0mm · 0.94mm/px · 1 of 61 slices shown]
[im 1/61]
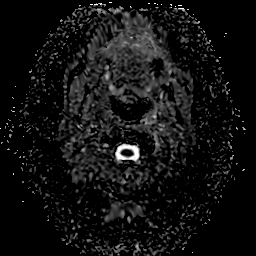

[Series 500: SWI · axial · 3.0mm · 0.51mm/px · z∈[-39,+51]mm · 2 of 124 slices shown (2 of 2)]
[im 1/124]
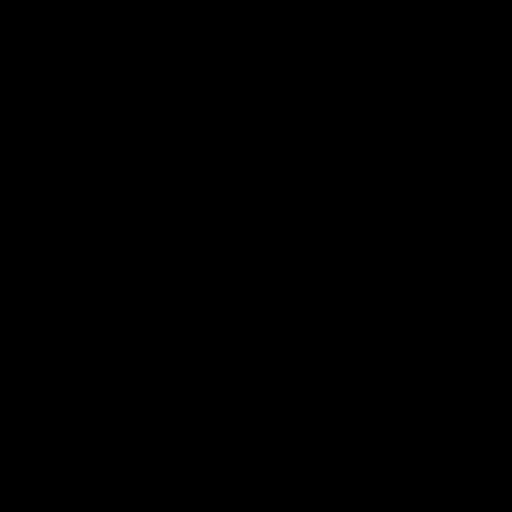
[im 62/124]
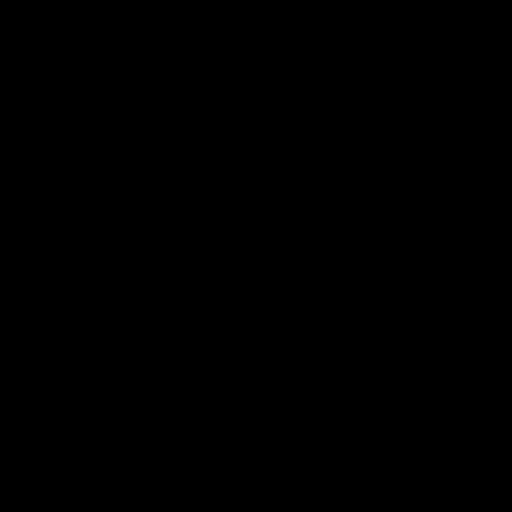

[Series 1200: T1 post-contrast · axial · 0.9mm · 0.50mm/px · z∈[-128,+125]mm · 7 of 290 slices shown (2 of 2)]
[im 1/290]
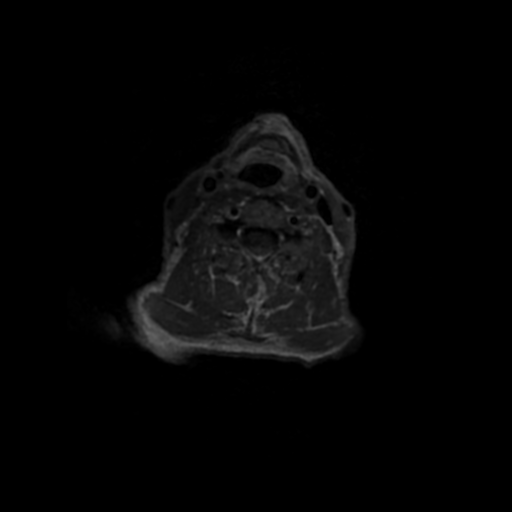
[im 49/290]
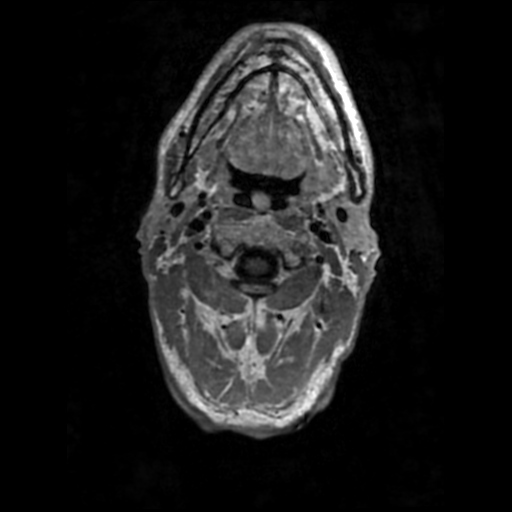
[im 97/290]
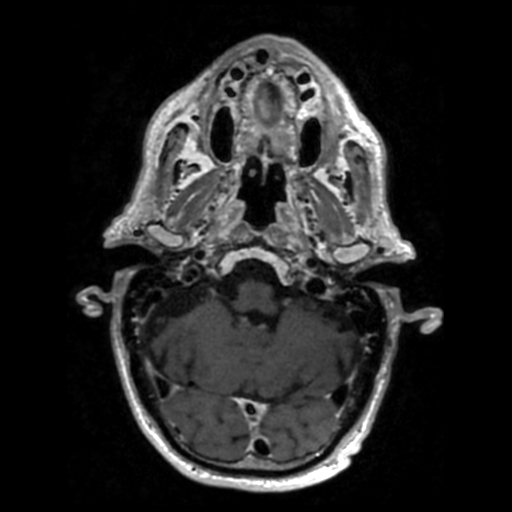
[im 145/290]
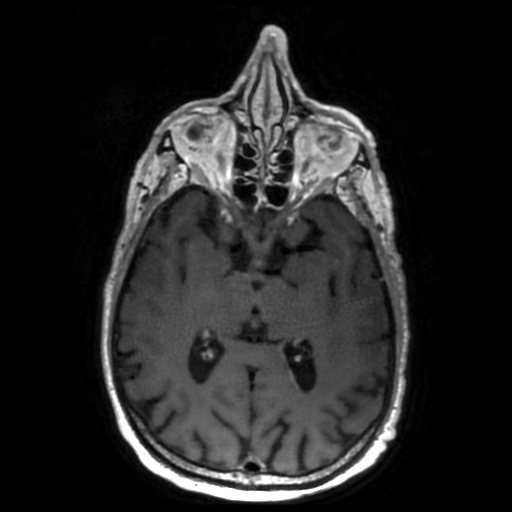
[im 193/290]
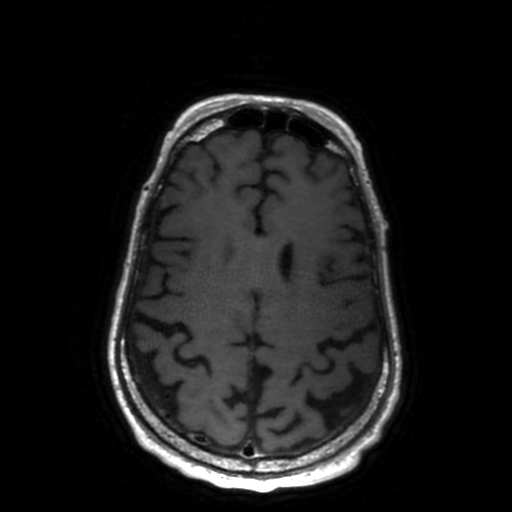
[im 241/290]
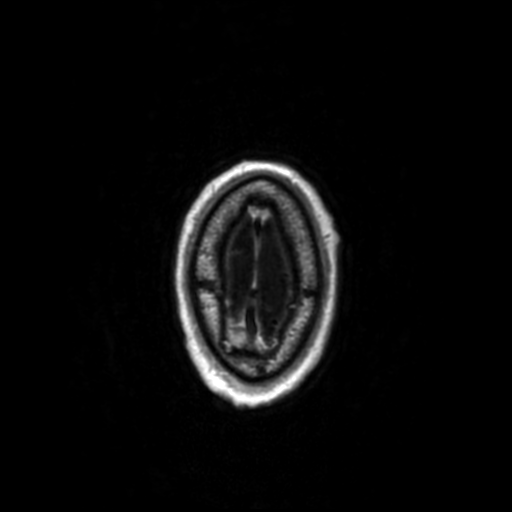
[im 290/290]
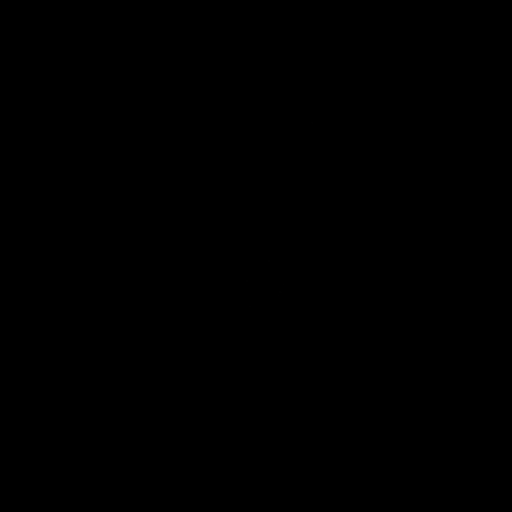

[22 of 48 positions shown; findings below may reference images not displayed]

FINDINGS: Brain: No acute infarction, hemorrhage, hydrocephalus, extra-axial
collection, or mass lesion. The ventricles and sulci are normal for
age. No abnormal enhancement.

Vascular: Normal flow voids.

Skull and upper cervical spine: Normal marrow signal.

Sinuses/Orbits: Negative.

Other: Fluid in the bilateral mastoid air cells.
IMPRESSION: No acute intracranial process. No evidence of intracranial
metastatic disease.

## 2022-01-27 DIAGNOSIS — H2513 Age-related nuclear cataract, bilateral: Secondary | ICD-10-CM | POA: Diagnosis not present

## 2022-01-27 DIAGNOSIS — H353131 Nonexudative age-related macular degeneration, bilateral, early dry stage: Secondary | ICD-10-CM | POA: Diagnosis not present

## 2022-01-30 ENCOUNTER — Encounter: Payer: Self-pay | Admitting: Gastroenterology

## 2022-02-06 ENCOUNTER — Other Ambulatory Visit: Payer: Self-pay | Admitting: Radiation Therapy

## 2022-02-06 DIAGNOSIS — C3490 Malignant neoplasm of unspecified part of unspecified bronchus or lung: Secondary | ICD-10-CM

## 2022-02-19 ENCOUNTER — Telehealth: Payer: Self-pay | Admitting: Radiation Therapy

## 2022-02-19 NOTE — Telephone Encounter (Signed)
Spoke with Ian Hart about the brain MRI and telephone follow-up with Shona Simpson in January. He has written down the appointment information and plans to attend.  Mont Dutton R.T.(R)(T) Radiation Special Procedures Navigator

## 2022-02-24 ENCOUNTER — Telehealth: Payer: Self-pay | Admitting: Internal Medicine

## 2022-02-24 NOTE — Telephone Encounter (Signed)
Rescheduled 11/29 appointment to 12/05 per provider pal, patient has been called and notified.

## 2022-02-25 ENCOUNTER — Telehealth: Payer: Self-pay | Admitting: Gastroenterology

## 2022-02-25 NOTE — Telephone Encounter (Signed)
Good afternoon Dr. Candis Schatz,  Patient called this afternoon stating that he had received a letter from our practice stating that he needed to have an office visit before scheduling a colonoscopy. I advised patient that information was correct and he just needed to make an appointment and from that appointment he would be about to discuss with you the colonoscopy. Patient stated that he did not understand the need for him to come in and wanting to know if he could just do a visit over mychart.   Can you please advise on scheduling patient?  Thank you so much.

## 2022-02-26 ENCOUNTER — Encounter: Payer: Self-pay | Admitting: Gastroenterology

## 2022-02-26 NOTE — Telephone Encounter (Signed)
Thank you Dr. Candis Schatz!   Patient has been scheduled for colonoscopy for 01/12 at 11:00am and telephone PV at 10:30

## 2022-03-10 ENCOUNTER — Inpatient Hospital Stay: Payer: BC Managed Care – PPO | Attending: Physician Assistant

## 2022-03-10 ENCOUNTER — Ambulatory Visit (HOSPITAL_COMMUNITY)
Admission: RE | Admit: 2022-03-10 | Discharge: 2022-03-10 | Disposition: A | Discharge status: Home / Self Care | Payer: BC Managed Care – PPO | Source: Ambulatory Visit | Attending: Internal Medicine | Admitting: Internal Medicine

## 2022-03-10 ENCOUNTER — Encounter: Payer: Self-pay | Admitting: Physician Assistant

## 2022-03-10 DIAGNOSIS — C3490 Malignant neoplasm of unspecified part of unspecified bronchus or lung: Secondary | ICD-10-CM

## 2022-03-10 DIAGNOSIS — C3491 Malignant neoplasm of unspecified part of right bronchus or lung: Secondary | ICD-10-CM | POA: Diagnosis not present

## 2022-03-10 DIAGNOSIS — J439 Emphysema, unspecified: Secondary | ICD-10-CM | POA: Diagnosis not present

## 2022-03-10 DIAGNOSIS — R911 Solitary pulmonary nodule: Secondary | ICD-10-CM | POA: Diagnosis not present

## 2022-03-10 LAB — CMP (CANCER CENTER ONLY)
ALT: 15 U/L (ref 0–44)
AST: 21 U/L (ref 15–41)
Albumin: 4.7 g/dL (ref 3.5–5.0)
Alkaline Phosphatase: 73 U/L (ref 38–126)
Anion gap: 7 (ref 5–15)
BUN: 16 mg/dL (ref 8–23)
CO2: 28 mmol/L (ref 22–32)
Calcium: 9.8 mg/dL (ref 8.9–10.3)
Chloride: 95 mmol/L — ABNORMAL LOW (ref 98–111)
Creatinine: 1.14 mg/dL (ref 0.61–1.24)
GFR, Estimated: >60 mL/min (ref >60)
Glucose, Bld: 102 mg/dL — ABNORMAL HIGH (ref 70–99)
Potassium: 4.2 mmol/L (ref 3.5–5.1)
Sodium: 130 mmol/L — ABNORMAL LOW (ref 135–145)
Total Bilirubin: 0.7 mg/dL (ref 0.3–1.2)
Total Protein: 7.2 g/dL (ref 6.5–8.1)

## 2022-03-10 LAB — CBC WITH DIFFERENTIAL (CANCER CENTER ONLY)
Abs Immature Granulocytes: 0.01 10*3/uL (ref 0.00–0.07)
Basophils Absolute: 0.1 10*3/uL (ref 0.0–0.1)
Basophils Relative: 1 %
Eosinophils Absolute: 0.2 10*3/uL (ref 0.0–0.5)
Eosinophils Relative: 3 %
HCT: 38.2 % — ABNORMAL LOW (ref 39.0–52.0)
Hemoglobin: 13.3 g/dL (ref 13.0–17.0)
Immature Granulocytes: 0 %
Lymphocytes Relative: 13 %
Lymphs Abs: 0.7 10*3/uL (ref 0.7–4.0)
MCH: 33.3 pg (ref 26.0–34.0)
MCHC: 34.8 g/dL (ref 30.0–36.0)
MCV: 95.7 fL (ref 80.0–100.0)
Monocytes Absolute: 0.6 10*3/uL (ref 0.1–1.0)
Monocytes Relative: 12 %
Neutro Abs: 3.7 10*3/uL (ref 1.7–7.7)
Neutrophils Relative %: 71 %
Platelet Count: 173 10*3/uL (ref 150–400)
RBC: 3.99 MIL/uL — ABNORMAL LOW (ref 4.22–5.81)
RDW: 12.5 % (ref 11.5–15.5)
WBC Count: 5.3 10*3/uL (ref 4.0–10.5)
nRBC: 0 % (ref 0.0–0.2)

## 2022-03-10 MED ORDER — IOHEXOL 300 MG/ML  SOLN
75.0000 mL | Freq: Once | INTRAMUSCULAR | Status: AC | PRN
  Administered 2022-03-10: 75 mL via INTRAVENOUS

## 2022-03-12 ENCOUNTER — Ambulatory Visit: Payer: Medicare Other | Admitting: Internal Medicine

## 2022-03-16 NOTE — Progress Notes (Unsigned)
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  Status post 4 cycles.  Starting from cycle #3, cisplatin was changed to carboplatin for an AUC of 5 due to renal insufficiency. First dose on 04/20/20. Onpro was added to the treatment plan starting from cycle #3 due to pancytopenia.  2) PCI under the care of Dr. Lisbeth Renshaw. Completed on 08/31/20 3) Robotic nephrectomy on 01/28/21 under the care of Dr. Brendia Sacks at [facility]: Observation   INTERVAL HISTORY: Ian Hart 71 y.o. male returns to the clinic today for a follow up visit accompanied by his wife. The patient is feeling fairly well today. He was last seen 6 months ago by Dr. Julien Nordmann. He denies any concerning complaints today. Denies any fever, chills, or night sweats. 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 Denies any chest pain, cough, or hemoptysis. He denies significant dyspnea on exertion. Denies any nausea and vomiting.  He denies diarrhea or constipation. He is scheduled for a routine colonoscopy soon. He denies any headaches or visual changes. He is scheduled to see his urologist at [facility] in May 2024 for follow up for his history of renal cell carcinoma.  He recently had a restaging CT scan performed. He is here for evaluation and to review his scan results.     MEDICAL HISTORY: Past Medical History:  Diagnosis Date    History of chicken pox    History of shingles 04/2010   Lung cancer ([place]) 02/2020    ALLERGIES:  is allergic to acyclovir and related.  MEDICATIONS:  Current Outpatient Medications  Medication Sig Dispense Refill   ascorbic acid (VITAMIN C) 500 MG tablet Take 500 mg by mouth daily.     Multiple Vitamin (MULTIVITAMIN) tablet Take 1 tablet by mouth daily.     tamsulosin (FLOMAX) 0.4 MG CAPS capsule Take 0.4 mg by mouth daily.     vitamin B-12 (CYANOCOBALAMIN) 1000 MCG tablet Take 1,000 mcg by mouth daily.     No current facility-administered medications for this visit.    SURGICAL HISTORY:  Past Surgical History:  Procedure Laterality Date   BRONCHIAL BIOPSY  03/22/2020   Procedure: BRONCHIAL BIOPSIES;  Surgeon: Garner Nash, DO;  Location: [facility];  Service: Pulmonary;;   BRONCHIAL BRUSHINGS  03/22/2020   Procedure: BRONCHIAL BRUSHINGS;  Surgeon: Garner Nash, DO;  Location: [facility];  Service: Pulmonary;;   BRONCHIAL NEEDLE ASPIRATION BIOPSY  03/22/2020   Procedure: BRONCHIAL NEEDLE ASPIRATION BIOPSIES;  Surgeon: Garner Nash, DO;  Location: [facility];  Service: Pulmonary;;   BRONCHIAL WASHINGS  03/22/2020   Procedure: BRONCHIAL WASHINGS;  Surgeon: Garner Nash, DO;  Location: [facility];  Service: Pulmonary;;   FIDUCIAL MARKER PLACEMENT  03/22/2020   Procedure: FIDUCIAL MARKER PLACEMENT;  Surgeon: Garner Nash, DO;  Location: [facility];  Service: Pulmonary;;   NO PAST SURGERIES  VIDEO BRONCHOSCOPY WITH ENDOBRONCHIAL NAVIGATION N/A 03/22/2020   Procedure: VIDEO BRONCHOSCOPY WITH ENDOBRONCHIAL NAVIGATION;  Surgeon: Garner Nash, DO;  Location: [facility];  Service: Pulmonary;  Laterality: N/A;   VIDEO BRONCHOSCOPY WITH ENDOBRONCHIAL ULTRASOUND N/A 03/22/2020   Procedure: VIDEO BRONCHOSCOPY WITH ENDOBRONCHIAL ULTRASOUND;  Surgeon: Garner Nash, DO;  Location: [facility];  Service: Pulmonary;  Laterality: N/A;    REVIEW OF SYSTEMS:    Review of Systems  Constitutional: Positive for decreased appetite and weight loss. Negative for chills, fatigue, and fever.  HENT: Negative for mouth sores, nosebleeds, sore throat and trouble swallowing.   Eyes: Negative for eye problems and icterus.  Respiratory: Negative for cough, hemoptysis, shortness of breath and wheezing.   Cardiovascular: Negative for chest pain and leg swelling.  Gastrointestinal: Negative for abdominal pain, constipation, diarrhea, nausea and vomiting.  Genitourinary: Negative for bladder incontinence, difficulty urinating, dysuria, frequency and hematuria.   Musculoskeletal: Negative for back pain, gait problem, neck pain and neck stiffness.  Skin: Negative for itching and rash.  Neurological: Negative for dizziness, extremity weakness, gait problem, headaches, light-headedness and seizures.  Hematological: Negative for adenopathy. Does not bruise/bleed easily.  Psychiatric/Behavioral: Negative for confusion, depression and sleep disturbance. The patient is not nervous/anxious.     PHYSICAL EXAMINATION:  Blood pressure 115/63, pulse 75, temperature 98.2 F (36.8 C), temperature source Oral, resp. rate 18, height 6\' 1"  (1.854 m), weight 130 lb 8 oz (59.2 kg), SpO2 99 %.  ECOG PERFORMANCE STATUS: 1  Physical Exam  Constitutional: Oriented to person, place, and time and thin appearing male and in no distress.  HENT:  Head: Normocephalic and atraumatic.  Mouth/Throat: Oropharynx is clear and moist. No oropharyngeal exudate.  Eyes: Conjunctivae are normal. Right eye exhibits no discharge. Left eye exhibits no discharge. No scleral icterus.  Neck: Normal range of motion. Neck supple.  Cardiovascular: Normal rate, regular rhythm, normal heart sounds and intact distal pulses.   Pulmonary/Chest: Effort normal and breath sounds normal. No respiratory distress. No wheezes. No rales.  Abdominal: Soft. Bowel sounds are normal. Exhibits no distension and no mass. There  is no tenderness.  Musculoskeletal: Normal range of motion. Exhibits no edema.  Lymphadenopathy:    No cervical adenopathy.  Neurological: Alert and oriented to person, place, and time. Exhibits muscle wasting. Gait normal. Coordination normal.  Skin: Skin is warm and dry. No rash noted. Not diaphoretic. No erythema. No pallor.  Psychiatric: Mood, memory and judgment normal.  Vitals reviewed.  LABORATORY DATA: Lab Results  Component Value Date   WBC 5.3 03/10/2022   HGB 13.3 03/10/2022   HCT 38.2 (L) 03/10/2022   MCV 95.7 03/10/2022   PLT 173 03/10/2022      Chemistry      Component Value Date/Time   NA 130 (L) 03/10/2022 1008   K 4.2 03/10/2022 1008   CL 95 (L) 03/10/2022 1008   CO2 28 03/10/2022 1008   BUN 16 03/10/2022 1008   CREATININE 1.14 03/10/2022 1008   CREATININE 0.86 11/12/2011 0917      Component Value Date/Time   CALCIUM 9.8 03/10/2022 1008   ALKPHOS 73 03/10/2022 1008   AST 21 03/10/2022 1008   ALT 15 03/10/2022 1008   BILITOT 0.7 03/10/2022 1008       RADIOGRAPHIC STUDIES:  CT Chest W Contrast  Result Date: 03/10/2022 CLINICAL DATA:  Small-cell lung cancer. Restaging. * Tracking Code: BO * EXAM: CT CHEST WITH CONTRAST TECHNIQUE: Multidetector CT imaging of the chest was performed during  intravenous contrast administration. RADIATION DOSE REDUCTION: This exam was performed according to the departmental dose-optimization program which includes automated exposure control, adjustment of the mA and/or kV according to patient size and/or use of iterative reconstruction technique. CONTRAST:  7mL OMNIPAQUE IOHEXOL 300 MG/ML  SOLN COMPARISON:  09/03/2021 FINDINGS: Cardiovascular: The heart size is normal. No substantial pericardial effusion. No thoracic aortic aneurysm. Mediastinum/Nodes: No mediastinal lymphadenopathy. There is no hilar lymphadenopathy. Mild circumferential wall thickening noted mid esophagus (77/2) similar to prior There is no axillary  lymphadenopathy. Lungs/Pleura: Centrilobular and paraseptal emphysema evident. Parenchymal scarring in the parahilar right lung is compatible with prior radiation therapy. Medial right upper lobe pulmonary nodule adjacent to a fiducial marker measured previously at 10 x 5 mm is 10 x 4 mm today on 51/7. No new suspicious pulmonary nodule or mass. No focal airspace consolidation. No pleural effusion. Upper Abdomen: Tiny hypodense lesion inferior liver and ill-defined hypoattenuating lesion posterior upper pole right kidney are stable in the interval. 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 Musculoskeletal: No worrisome lytic or sclerotic osseous abnormality. IMPRESSION: 1. Stable exam. No new or progressive findings. 2. Stable post radiation scarring in the parahilar right lung. The index right upper lobe nodular density described on previous studies is stable in the interval. 3. Mild circumferential wall thickening mid esophagus, similar to prior. This may well be treatment related. 4.  Emphysema (ICD10-J43.9). Electronically Signed   By: Misty Stanley M.D.   On: 03/10/2022 12:52     ASSESSMENT/PLAN:  This is a very pleasant 71 year old Caucasian male diagnosed with poorly differentiated carcinoma consistent with adenocarcinoma in the right upper lobe.  In addition to small cell lung cancer in the station 7 lymph node diagnosed in December 2021.    He is status post a course of systemic chemotherapy initially with cisplatin and etoposide but the cisplatin was discontinued secondary to renal insufficiency and the patient continued 3 more cycles of his systemic chemotherapy with carboplatin and etoposide concurrent with radiation. He has a rough time with the treatment including pancytopenia as well as fatigue and weight loss.   He completed PCI under the care of Dr. Lisbeth Renshaw 08/31/20.   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   The patient recently had a restaging CT scan performed.  Dr. Julien Nordmann personally independently reviewed the scan and discussed the results with the patient today.  The scan did not show any evidence of disease progression.  He will continue on observation with a repeat CT scan of the chest in 6 months.  We will see him back for follow-up visit at that time to review his scan results. He will see his nephrologist in May 2024 for follow up.   Regarding his weight loss, he is expected to have colonoscopy soon. Dr. Julien Nordmann recommended he keep this appointment. He was encouraged to increase his protein intake and eat small frequent meals.   The patient was advised to call immediately if she has any concerning symptoms in the interval. The patient voices understanding of current disease status and treatment options and is in agreement with the current care plan. All questions were answered. The patient knows to call the clinic with any problems, questions or concerns. We can certainly see the patient much sooner if necessary       Orders  Placed This Encounter  Procedures   CT Chest W Contrast    Standing Status:   Future    Standing Expiration Date:   03/18/2023    Order Specific Question:   If indicated for the ordered procedure, I authorize the administration of contrast media per Radiology protocol    Answer:   Yes    Order Specific Question:   Does the patient have a contrast media/X-ray dye allergy?    Answer:   No    Order Specific Question:   Preferred imaging location?    Answer:   [facility]   CBC with Differential ([facility] Only)    Standing Status:   Future    Standing Expiration Date:   03/19/2023   CMP ([facility] only)    Standing Status:   Future    Standing Expiration Date:   03/19/2023      Tobe Sos Azka Steger,  PA-C 03/18/22  ADDENDUM: Hematology/Oncology Attending: I had a face-to-face encounter with the patient today.  I reviewed his record, lab, scan and recommended his care plan.  This is a very pleasant 71 years old white male with history of poorly differentiated carcinoma consistent with adenocarcinoma in the right upper lobe in addition to small cell lung cancer and station 7 lymph node diagnosed in December 2021 is status post systemic chemotherapy with cisplatin and etoposide concurrent with radiotherapy followed by prophylactic cranial irradiation.  The patient has been on observation since that time and he is feeling fine with no concerning complaints. He had repeat CT scan of the chest performed recently.  I personally and independently reviewed the scan and discussed the result with the patient and his wife. His scan showed no concerning findings for disease recurrence or metastasis.  The patient has some weight loss recently and he is scheduled to have repeat colonoscopy soon by his gastroenterologist.  I recommended for him to continue on obsvisittervation with repeat CT scan of the chest in 6 months. The patient was advised to call immediately if he has any other concerning symptoms in the interval. Disclaimer: This note was dictated with voice recognition software. Similar sounding words can inadvertently be transcribed and may be missed upon review. Eilleen Kempf, MD

## 2022-03-18 ENCOUNTER — Inpatient Hospital Stay: Payer: BC Managed Care – PPO | Attending: Physician Assistant | Admitting: Physician Assistant

## 2022-03-18 VITALS — BP 115/63 | HR 75 | Temp 98.2°F | Resp 18 | Ht 73.0 in | Wt 130.5 lb

## 2022-03-18 DIAGNOSIS — C779 Secondary and unspecified malignant neoplasm of lymph node, unspecified: Secondary | ICD-10-CM

## 2022-03-18 DIAGNOSIS — C3490 Malignant neoplasm of unspecified part of unspecified bronchus or lung: Secondary | ICD-10-CM

## 2022-03-18 DIAGNOSIS — C3491 Malignant neoplasm of unspecified part of right bronchus or lung: Secondary | ICD-10-CM | POA: Diagnosis not present

## 2022-03-18 DIAGNOSIS — C3411 Malignant neoplasm of upper lobe, right bronchus or lung: Secondary | ICD-10-CM | POA: Insufficient documentation

## 2022-03-18 DIAGNOSIS — Z85528 Personal history of other malignant neoplasm of kidney: Secondary | ICD-10-CM

## 2022-03-18 DIAGNOSIS — C642 Malignant neoplasm of left kidney, except renal pelvis: Secondary | ICD-10-CM | POA: Insufficient documentation

## 2022-04-10 ENCOUNTER — Telehealth: Payer: Self-pay | Admitting: *Deleted

## 2022-04-10 NOTE — Telephone Encounter (Signed)
Called pt.to schedule OV per recall assessment ,pt. Scheduled for 05/13/22

## 2022-04-22 ENCOUNTER — Ambulatory Visit (INDEPENDENT_AMBULATORY_CARE_PROVIDER_SITE_OTHER): Payer: BC Managed Care – PPO | Admitting: *Deleted

## 2022-04-22 DIAGNOSIS — Z0000 Encounter for general adult medical examination without abnormal findings: Secondary | ICD-10-CM | POA: Diagnosis not present

## 2022-04-22 NOTE — Progress Notes (Signed)
Subjective:   Ian Hart is a 72 y.o. male who presents for Medicare Annual/Subsequent preventive examination.  I connected with  Chad Cordial on 04/22/22 by a audio enabled telemedicine application and verified that I am speaking with the correct person using two identifiers.  Patient Location: Home  Provider Location: Office/Clinic  I discussed the limitations of evaluation and management by telemedicine. The patient expressed understanding and agreed to proceed.   Review of Systems    Defer to PCP Cardiac Risk Factors include: advanced age (>55men, >7 women);male gender     Objective:    There were no vitals filed for this visit. There is no height or weight on file to calculate BMI.     04/22/2022    8:23 AM 11/04/2021    9:36 AM 09/05/2021    2:30 PM 07/05/2021   12:36 PM 06/06/2021   10:56 AM 04/19/2021    8:26 AM 12/07/2020   12:58 PM  Advanced Directives  Does Patient Have a Medical Advance Directive? Yes Yes Yes Yes Yes Yes Yes  Type of Paramedic of [place];Living will Living will;Healthcare Power of Attorney Living will;Healthcare Power of Attorney Living will;Healthcare Power of [place];Living will   Does patient want to make changes to medical advance directive? No - Patient declined        Copy of [place] in Chart? No - copy requested  No - copy requested   No - copy requested     Current Medications (verified) Outpatient Encounter Medications as of 04/22/2022  Medication Sig   Multiple Vitamin (MULTIVITAMIN) tablet Take 1 tablet by mouth daily.   tamsulosin (FLOMAX) 0.4 MG CAPS capsule Take 0.4 mg by mouth daily.   [DISCONTINUED] ascorbic acid (VITAMIN C) 500 MG tablet Take 500 mg by mouth daily.   [DISCONTINUED] vitamin B-12 (CYANOCOBALAMIN) 1000 MCG tablet Take 1,000 mcg by mouth daily.   No facility-administered encounter medications on file as of 04/22/2022.    Allergies  (verified) Acyclovir and related   History: Past Medical History:  Diagnosis Date   History of chicken pox    History of shingles 04/2010   Lung cancer ([place]) 02/2020   Past Surgical History:  Procedure Laterality Date   BRONCHIAL BIOPSY  03/22/2020   Procedure: BRONCHIAL BIOPSIES;  Surgeon: Garner Nash, DO;  Location: [facility];  Service: Pulmonary;;   BRONCHIAL BRUSHINGS  03/22/2020   Procedure: BRONCHIAL BRUSHINGS;  Surgeon: Garner Nash, DO;  Location: [facility];  Service: Pulmonary;;   BRONCHIAL NEEDLE ASPIRATION BIOPSY  03/22/2020   Procedure: BRONCHIAL NEEDLE ASPIRATION BIOPSIES;  Surgeon: Garner Nash, DO;  Location: [facility];  Service: Pulmonary;;   BRONCHIAL WASHINGS  03/22/2020   Procedure: BRONCHIAL WASHINGS;  Surgeon: Garner Nash, DO;  Location: [facility];  Service: Pulmonary;;   FIDUCIAL MARKER PLACEMENT  03/22/2020   Procedure: FIDUCIAL MARKER PLACEMENT;  Surgeon: Garner Nash, DO;  Location: [facility];  Service: Pulmonary;;   NO PAST SURGERIES     VIDEO BRONCHOSCOPY WITH ENDOBRONCHIAL NAVIGATION N/A 03/22/2020   Procedure: VIDEO BRONCHOSCOPY WITH ENDOBRONCHIAL NAVIGATION;  Surgeon: Garner Nash, DO;  Location: [facility];  Service: Pulmonary;  Laterality: N/A;   VIDEO BRONCHOSCOPY WITH ENDOBRONCHIAL ULTRASOUND N/A 03/22/2020   Procedure: VIDEO BRONCHOSCOPY WITH ENDOBRONCHIAL ULTRASOUND;  Surgeon: Garner Nash, DO;  Location: [facility];  Service: Pulmonary;  Laterality: N/A;   Family History  Problem Relation Age of Onset  Sudden death Mother    Rheumatic fever Mother    Hypertension Neg Hx    Hyperlipidemia Neg Hx    Heart attack Neg Hx    Diabetes Neg Hx    Colon cancer Neg Hx    Stomach cancer Neg Hx    Social History   Socioeconomic History   Marital status: Married    Spouse name: Not on file   Number of children: 1   Years of education: Not on file   Highest education level: Not on file  Occupational  History   Occupation: retired  Tobacco Use   Smoking status: Former    Packs/day: 2.00    Years: 50.00    Total pack years: 100.00    Types: Cigarettes   Smokeless tobacco: Never   Tobacco comments:    07/25/20  Vaping Use   Vaping Use: Never used  Substance and Sexual Activity   Alcohol use: Yes    Alcohol/week: 5.0 standard drinks of alcohol    Types: 3 Cans of beer, 2 Shots of liquor per week    Comment: daily   Drug use: No   Sexual activity: Yes  Other Topics Concern   Not on file  Social History Narrative   Regular exercise:  2-3 x weekly (sports, yardwork)   Caffeine use:  3 cups coffee daily   74 yr old son   Wife   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           Social Determinants of Health   Financial Resource Strain: Low Risk  (04/19/2021)   Overall Financial Resource Strain (CARDIA)    Difficulty of Paying Living Expenses: Not hard at all  Food Insecurity: No Food Insecurity (04/22/2022)   Hunger Vital Sign    Worried About Running Out of Food in the Last Year: Never true    Ran Out of Food in the Last Year: Never true  Transportation Needs: No Transportation Needs (04/22/2022)   PRAPARE - Hydrologist (Medical): No    Lack of Transportation (Non-Medical): No  Physical Activity: Insufficiently Active (04/19/2021)   Exercise Vital Sign    Days of Exercise per Week: 7 days    Minutes of Exercise per Session: 20 min  Stress: No Stress Concern Present (04/19/2021)   [place]    Feeling of Stress : Not at all  Social Connections: Moderately Integrated (04/19/2021)   Social Connection and Isolation Panel [NHANES]    Frequency of Communication with Friends and Family: More than three times a week    Frequency of Social Gatherings with Friends and Family: More than three times a week    Attends Religious Services: More than 4  times per year    Active Member of [employer] or Organizations: No    Attends Archivist Meetings: Never    Marital Status: Married    Tobacco Counseling Counseling given: Not Answered Tobacco comments: 07/25/20   Clinical Intake:  Pre-visit preparation completed: Yes  Pain : No/denies pain  Diabetes: No  How often do you need to have someone help you when you read instructions, pamphlets, or other written materials from your doctor or pharmacy?: 1 - Never  Activities of Daily Living    04/22/2022    8:29 AM  In your present state of health, do you have any difficulty performing the following activities:  Hearing? 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 0  Difficulty concentrating or making decisions? 0  Walking or climbing stairs? 0  Dressing or bathing? 0  Doing errands, shopping? 0  Preparing Food and eating ? N  Using the Toilet? N  In the past six months, have you accidently leaked urine? N  Do you have problems with loss of bowel control? N  Managing your Medications? N  Managing your Finances? N  Housekeeping or managing your Housekeeping? N    Patient Care Team: Debbrah Alar, NP as PCP - General (Internal Medicine)  Indicate any recent Medical Services you may have received from other than [facility] providers in the past year (date may be approximate).     Assessment:   This is a routine wellness examination for [place].  Hearing/Vision screen No results found.  Dietary issues and exercise activities discussed: Current Exercise Habits: The patient does not participate in regular exercise at present, Exercise limited by: None identified   Goals Addressed   None    Depression Screen    04/22/2022    8:26 AM 04/19/2021    8:30 AM 02/10/2020    8:47 AM  PHQ 2/9 Scores  PHQ - 2 Score 0 0 1  PHQ- 9 Score   7    Fall Risk    04/22/2022    8:24 AM 04/19/2021    8:28 AM  Fall Risk   Falls in the past year? 0 0  Number falls in past  yr: 0 0  Injury with Fall? 0 0  Risk for fall due to : No Fall Risks   Follow up Falls evaluation completed Falls prevention discussed    FALL RISK PREVENTION PERTAINING TO THE HOME:  Any stairs in or around the home? No  If so, are there any without handrails? No  Home free of loose throw rugs in walkways, pet beds, electrical cords, etc? No  Adequate lighting in your home to reduce risk of falls? No   ASSISTIVE DEVICES UTILIZED TO PREVENT FALLS:  Life alert? No  Use of a cane, walker or w/c? No  Grab bars in the bathroom? No  Shower chair or bench in shower? No  Elevated toilet seat or a handicapped toilet?  Comfort height  TIMED UP AND GO:  Was the test performed?  No, audio visit .    Cognitive Function:        04/22/2022    8:34 AM  6CIT Screen  What Year? 0 points  What month? 0 points  What time? 0 points  Count back from 20 0 points  Months in reverse 0 points  Repeat phrase 2 points  Total Score 2 points    Immunizations Immunization History  Administered Date(s) Administered   Fluad Quad(high Dose 65+) 01/27/2020   Influenza, High Dose Seasonal PF 01/29/2021, 02/28/2022   Influenza,inj,Quad PF,6+ Mos 02/04/2013, 01/02/2014   PFIZER(Purple Top)SARS-COV-2 Vaccination 06/06/2019, 06/27/2019, 03/09/2020, 09/12/2020   Pfizer Covid-19 Vaccine Bivalent Booster 52yrs & up 02/06/2021   Pneumococcal Polysaccharide-23 02/10/2020, 01/29/2021   Tdap 11/10/2011   Zoster, Live 12/11/2011    TDAP status: Due, Education has been provided regarding the importance of this vaccine. Advised may receive this vaccine at local pharmacy or Health Dept. Aware to provide a copy of the vaccination record if obtained from local pharmacy or Health Dept. Verbalized acceptance and understanding.  Flu Vaccine status: Up to date  Pneumococcal vaccine status: Due, Education has been provided regarding the  importance of this vaccine. Advised may receive this vaccine at local pharmacy  or Health Dept. Aware to provide a copy of the vaccination record if obtained from local pharmacy or Health Dept. Verbalized acceptance and understanding.  Covid-19 vaccine status: Information provided on how to obtain vaccines.   Qualifies for Shingles Vaccine? Yes   Zostavax completed Yes   Shingrix Completed?: No.    Education has been provided regarding the importance of this vaccine. Patient has been advised to call insurance company to determine out of pocket expense if they have not yet received this vaccine. Advised may also receive vaccine at local pharmacy or Health Dept. Verbalized acceptance and understanding.  Screening Tests Health Maintenance  Topic Date Due   Hepatitis C Screening  Never done   Zoster Vaccines- Shingrix (1 of 2) Never done   DTaP/Tdap/Td (2 - Td or Tdap) 11/09/2021   COVID-19 Vaccine (6 - 2023-24 season) 12/13/2021   COLONOSCOPY (Pts 45-91yrs Insurance coverage will need to be confirmed)  01/08/2022   Pneumonia Vaccine 64+ Years old (2 - PCV) 01/29/2022   Medicare Annual Wellness (AWV)  04/19/2022   INFLUENZA VACCINE  Completed   HPV VACCINES  Aged Out    Health Maintenance  Health Maintenance Due  Topic Date Due   Hepatitis C Screening  Never done   Zoster Vaccines- Shingrix (1 of 2) Never done   DTaP/Tdap/Td (2 - Td or Tdap) 11/09/2021   COVID-19 Vaccine (6 - 2023-24 season) 12/13/2021   COLONOSCOPY (Pts 45-84yrs Insurance coverage will need to be confirmed)  01/08/2022   Pneumonia Vaccine 10+ Years old (2 - PCV) 01/29/2022   Medicare Annual Wellness (AWV)  04/19/2022    Colorectal cancer screening: Type of screening: Colonoscopy. Completed 01/09/12. Repeat every 10 years  Lung Cancer Screening: (Low Dose CT Chest recommended if Age 87-80 years, 30 pack-year currently smoking OR have quit w/in 15years.) does qualify.   Lung Cancer Screening Referral: already in system  Additional Screening:  Hepatitis C Screening: does qualify; Completed  N/a  Vision Screening: Recommended annual ophthalmology exams for early detection of glaucoma and other disorders of the eye. Is the patient up to date with their annual eye exam?  Yes  Who is the provider or what is the name of the office in which the patient attends annual eye exams? Can't remember name If pt is not established with a provider, would they like to be referred to a provider to establish care? No .   Dental Screening: Recommended annual dental exams for proper oral hygiene  Community Resource Referral / Chronic Care Management: CRR required this visit?  No   CCM required this visit?  No      Plan:     I have personally reviewed and noted the following in the patient's chart:   Medical and social history Use of alcohol, tobacco or illicit drugs  Current medications and supplements including opioid prescriptions. Patient is not currently taking opioid prescriptions. Functional ability and status Nutritional status Physical activity Advanced directives List of other physicians Hospitalizations, surgeries, and ER visits in previous 12 months Vitals Screenings to include cognitive, depression, and falls Referrals and appointments  In addition, I have reviewed and discussed with patient certain preventive protocols, quality metrics, and best practice recommendations. A written personalized care plan for preventive services as well as general preventive health recommendations were provided to patient.   Due to this being a telephonic visit, the after visit summary with patients personalized plan was  offered to patient via mail or my-chart. Patient would like to access on my-chart.  Beatris Ship, [place]   04/22/2022   Nurse Notes: None

## 2022-04-22 NOTE — Patient Instructions (Signed)
Ian Hart , Thank you for taking time to come for your Medicare Wellness Visit. I appreciate your ongoing commitment to your health goals. Please review the following plan we discussed and let me know if I can assist you in the future.   These are the goals we discussed:  Goals      Patient Stated     Gain more weight & increase exercise        This is a list of the screening recommended for you and due dates:  Health Maintenance  Topic Date Due   Hepatitis C Screening: USPSTF Recommendation to screen - Ages 88-79 yo.  Never done   Zoster (Shingles) Vaccine (1 of 2) Never done   DTaP/Tdap/Td vaccine (2 - Td or Tdap) 11/09/2021   COVID-19 Vaccine (6 - 2023-24 season) 12/13/2021   Colon Cancer Screening  01/08/2022   Pneumonia Vaccine (2 - PCV) 01/29/2022   Medicare Annual Wellness Visit  04/23/2023   Flu Shot  Completed   HPV Vaccine  Aged Out     Next appointment: Follow up in one year for your annual wellness visit.   Preventive Care 27 Years and Older, Male Preventive care refers to lifestyle choices and visits with your health care provider that can promote health and wellness. What does preventive care include? A yearly physical exam. This is also called an annual well check. Dental exams once or twice a year. Routine eye exams. Ask your health care provider how often you should have your eyes checked. Personal lifestyle choices, including: Daily care of your teeth and gums. Regular physical activity. Eating a healthy diet. Avoiding tobacco and drug use. Limiting alcohol use. Practicing safe sex. Taking low doses of aspirin every day. Taking vitamin and mineral supplements as recommended by your health care provider. What happens during an annual well check? The services and screenings done by your health care provider during your annual well check will depend on your age, overall health, lifestyle risk factors, and family history of disease. Counseling  Your  health care provider may ask you questions about your: Alcohol use. Tobacco use. Drug use. Emotional well-being. Home and relationship well-being. Sexual activity. Eating habits. History of falls. Memory and ability to understand (cognition). Work and work Statistician. Screening  You may have the following tests or measurements: Height, weight, and BMI. Blood pressure. Lipid and cholesterol levels. These may be checked every 5 years, or more frequently if you are over 15 years old. Skin check. Lung cancer screening. You may have this screening every year starting at age 63 if you have a 30-pack-year history of smoking and currently smoke or have quit within the past 15 years. Fecal occult blood test (FOBT) of the stool. You may have this test every year starting at age 71. Flexible sigmoidoscopy or colonoscopy. You may have a sigmoidoscopy every 5 years or a colonoscopy every 10 years starting at age 43. Prostate cancer screening. Recommendations will vary depending on your family history and other risks. Hepatitis C blood test. Hepatitis B blood test. Sexually transmitted disease (STD) testing. Diabetes screening. This is done by checking your blood sugar (glucose) after you have not eaten for a while (fasting). You may have this done every 1-3 years. Abdominal aortic aneurysm (AAA) screening. You may need this if you are a current or former smoker. Osteoporosis. You may be screened starting at age 31 if you are at high risk. Talk with your health care provider about your test results, treatment  options, and if necessary, the need for more tests. Vaccines  Your health care provider may recommend certain vaccines, such as: Influenza vaccine. This is recommended every year. Tetanus, diphtheria, and acellular pertussis (Tdap, Td) vaccine. You may need a Td booster every 10 years. Zoster vaccine. You may need this after age 43. Pneumococcal 13-valent conjugate (PCV13) vaccine. One dose  is recommended after age 63. Pneumococcal polysaccharide (PPSV23) vaccine. One dose is recommended after age 68. Talk to your health care provider about which screenings and vaccines you need and how often you need them. This information is not intended to replace advice given to you by your health care provider. Make sure you discuss any questions you have with your health care provider. Document Released: 04/27/2015 Document Revised: 12/19/2015 Document Reviewed: 01/30/2015 Elsevier Interactive Patient Education  2017 [employer] Prevention in the Home Falls can cause injuries. They can happen to people of all ages. There are many things you can do to make your home safe and to help prevent falls. What can I do on the outside of my home? Regularly fix the edges of walkways and driveways and fix any cracks. Remove anything that might make you trip as you walk through a door, such as a raised step or threshold. Trim any bushes or trees on the path to your home. Use bright outdoor lighting. Clear any walking paths of anything that might make someone trip, such as rocks or tools. Regularly check to see if handrails are loose or broken. Make sure that both sides of any steps have handrails. Any raised decks and porches should have guardrails on the edges. Have any leaves, snow, or ice cleared regularly. Use sand or salt on walking paths during winter. Clean up any spills in your garage right away. This includes oil or grease spills. What can I do in the bathroom? Use night lights. Install grab bars by the toilet and in the tub and shower. Do not use towel bars as grab bars. Use non-skid mats or decals in the tub or shower. If you need to sit down in the shower, use a plastic, non-slip stool. Keep the floor dry. Clean up any water that spills on the floor as soon as it happens. Remove soap buildup in the tub or shower regularly. Attach bath mats securely with double-sided non-slip rug  tape. Do not have throw rugs and other things on the floor that can make you trip. What can I do in the bedroom? Use night lights. Make sure that you have a light by your bed that is easy to reach. Do not use any sheets or blankets that are too big for your bed. They should not hang down onto the floor. Have a firm chair that has side arms. You can use this for support while you get dressed. Do not have throw rugs and other things on the floor that can make you trip. What can I do in the kitchen? Clean up any spills right away. Avoid walking on wet floors. Keep items that you use a lot in easy-to-reach places. If you need to reach something above you, use a strong step stool that has a grab bar. Keep electrical cords out of the way. Do not use floor polish or wax that makes floors slippery. If you must use wax, use non-skid floor wax. Do not have throw rugs and other things on the floor that can make you trip. What can I do with my stairs? Do not  leave any items on the stairs. Make sure that there are handrails on both sides of the stairs and use them. Fix handrails that are broken or loose. Make sure that handrails are as long as the stairways. Check any carpeting to make sure that it is firmly attached to the stairs. Fix any carpet that is loose or worn. Avoid having throw rugs at the top or bottom of the stairs. If you do have throw rugs, attach them to the floor with carpet tape. Make sure that you have a light switch at the top of the stairs and the bottom of the stairs. If you do not have them, ask someone to add them for you. What else can I do to help prevent falls? Wear shoes that: Do not have high heels. Have rubber bottoms. Are comfortable and fit you well. Are closed at the toe. Do not wear sandals. If you use a stepladder: Make sure that it is fully opened. Do not climb a closed stepladder. Make sure that both sides of the stepladder are locked into place. Ask someone to  hold it for you, if possible. Clearly mark and make sure that you can see: Any grab bars or handrails. First and last steps. Where the edge of each step is. Use tools that help you move around (mobility aids) if they are needed. These include: Canes. Walkers. Scooters. Crutches. Turn on the lights when you go into a dark area. Replace any light bulbs as soon as they burn out. Set up your furniture so you have a clear path. Avoid moving your furniture around. If any of your floors are uneven, fix them. If there are any pets around you, be aware of where they are. Review your medicines with your doctor. Some medicines can make you feel dizzy. This can increase your chance of falling. Ask your doctor what other things that you can do to help prevent falls. This information is not intended to replace advice given to you by your health care provider. Make sure you discuss any questions you have with your health care provider. Document Released: 01/25/2009 Document Revised: 09/06/2015 Document Reviewed: 05/05/2014 Elsevier Interactive Patient Education  2017 [employer].

## 2022-04-25 ENCOUNTER — Encounter: Payer: Medicare Other | Admitting: Gastroenterology

## 2022-04-29 ENCOUNTER — Ambulatory Visit (INDEPENDENT_AMBULATORY_CARE_PROVIDER_SITE_OTHER): Payer: BC Managed Care – PPO | Admitting: Family

## 2022-04-29 ENCOUNTER — Encounter: Payer: Self-pay | Admitting: Family

## 2022-04-29 VITALS — BP 129/73 | HR 101 | Temp 98.0°F | Resp 16 | Wt 136.0 lb

## 2022-04-29 DIAGNOSIS — Z23 Encounter for immunization: Secondary | ICD-10-CM

## 2022-04-29 DIAGNOSIS — Z720 Tobacco use: Secondary | ICD-10-CM | POA: Diagnosis not present

## 2022-04-29 DIAGNOSIS — C3491 Malignant neoplasm of unspecified part of right bronchus or lung: Secondary | ICD-10-CM

## 2022-04-29 DIAGNOSIS — R634 Abnormal weight loss: Secondary | ICD-10-CM

## 2022-04-29 DIAGNOSIS — C642 Malignant neoplasm of left kidney, except renal pelvis: Secondary | ICD-10-CM | POA: Insufficient documentation

## 2022-04-29 DIAGNOSIS — C649 Malignant neoplasm of unspecified kidney, except renal pelvis: Secondary | ICD-10-CM | POA: Insufficient documentation

## 2022-04-29 NOTE — Assessment & Plan Note (Signed)
S/p chemo and radiation. Last lung CT 11/23 shows no disease progression.  He also had an MRI of his brain 7/23 which did not show any sign of brain metastasis.

## 2022-04-29 NOTE — Assessment & Plan Note (Signed)
He is smoking "a couple of cigarettes" a day.  Recommended that he pick up the low dose nicotine patch OTC to begin.

## 2022-04-29 NOTE — Addendum Note (Signed)
Addended by: Wilford Corner on: 04/29/2022 01:41 PM   Modules accepted: Orders

## 2022-04-29 NOTE — Assessment & Plan Note (Addendum)
S/p partial nephrectomy Dr. Luane School 2022 with the following results:   Pathology: A. LET RENAL MASS, PARTIAL NEPHRECTOMY: Papillary renal cell carcinoma, type 1, nuclear grade 2. Tumor limited to kidney. Margins are negative for malignancy. See Cancer Protocol. pT1a B. KIDNEY, LEFT, BASE OF TUMOR #1, EXCISION: Negative for malignancy. C. KIDNEY, LEFT, BASE OF TUMOR #2, EXCISION: Negative for malignancy.    Surveillance plan per urology is annual CT scans.

## 2022-04-29 NOTE — Assessment & Plan Note (Signed)
Wt Readings from Last 3 Encounters:  04/29/22 136 lb (61.7 kg)  03/18/22 130 lb 8 oz (59.2 kg)  09/05/21 138 lb (62.6 kg)   States that he is starting to put weight back on. Has follow up with GI with ultimate plans to schedule colonoscopy.

## 2022-04-29 NOTE — Progress Notes (Signed)
Subjective:   By signing my name below, I, Pura Spice, attest that this documentation has been prepared under the direction and in the presence of Lemont Fillers, NP 04/29/22   Patient ID: Ian Hart, male    DOB: March 12, 1951, 72 y.o.   MRN: [id number]  Chief Complaint  Patient presents with   Follow-up   Nicotine Dependence    Will like to go back on the patches     HPI Patient is in today for follow up.  RUL lung adenocarcinoma: Diagnosed 03/2020. He is followed by hem onc Dr. Arbutus Ped. He last saw oncology PA Cassandra Heilingoetter on 03/18/22. His CT chest on 03/10/22 showed stable findings without any new or progressive findings. He reports receiving 10 days of radiation therapy for preventative measures given the potential for brain mets. He had a MRI brain on 11/01/21 which was negative and will repeat this on 05/07/22. He reports that his weight was 129lbs at his lowest and he has had steady weight gain since completing his treatments. 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 Odella Aquas.  Nicotine dependence: He states that he is smoking a couple of cigarettes a day while dealing with his health-related stress. He would like to resume use of nicotine patches. He has had success with these in the past. He tried nicotine gum previously without any help with smoking cessation.  Immunizations: He would like to receive the flu and pneumonia boosters today. He plans to get the COVID booster at the pharmacy.   Past Medical History:  Diagnosis Date   History of chicken pox    History of shingles 04/2010   Lung cancer (HCC) 02/2020    Past Surgical History:  Procedure Laterality Date   BRONCHIAL BIOPSY  03/22/2020   Procedure: BRONCHIAL  BIOPSIES;  Surgeon: Josephine Igo, DO;  Location: [facility];  Service: Pulmonary;;   BRONCHIAL BRUSHINGS  03/22/2020   Procedure: BRONCHIAL BRUSHINGS;  Surgeon: Josephine Igo, DO;  Location: [facility];  Service: Pulmonary;;   BRONCHIAL NEEDLE ASPIRATION BIOPSY  03/22/2020   Procedure: BRONCHIAL NEEDLE ASPIRATION BIOPSIES;  Surgeon: Josephine Igo, DO;  Location: [facility];  Service: Pulmonary;;   BRONCHIAL WASHINGS  03/22/2020   Procedure: BRONCHIAL WASHINGS;  Surgeon: Josephine Igo, DO;  Location: [facility];  Service: Pulmonary;;   FIDUCIAL MARKER PLACEMENT  03/22/2020   Procedure: FIDUCIAL MARKER PLACEMENT;  Surgeon: Josephine Igo, DO;  Location: [facility];  Service: Pulmonary;;   NO PAST SURGERIES     ROBOTIC ASSITED PARTIAL NEPHRECTOMY Left 2022   VIDEO BRONCHOSCOPY WITH ENDOBRONCHIAL NAVIGATION N/A 03/22/2020   Procedure: VIDEO BRONCHOSCOPY WITH ENDOBRONCHIAL NAVIGATION;  Surgeon: Josephine Igo, DO;  Location: [facility];  Service: Pulmonary;  Laterality: N/A;   VIDEO BRONCHOSCOPY WITH ENDOBRONCHIAL ULTRASOUND N/A 03/22/2020   Procedure: VIDEO BRONCHOSCOPY WITH ENDOBRONCHIAL ULTRASOUND;  Surgeon: Josephine Igo, DO;  Location: [facility];  Service: Pulmonary;  Laterality: N/A;    Family History  Problem Relation Age of Onset   Sudden death Mother    Rheumatic fever Mother    Hypertension Neg Hx    Hyperlipidemia Neg Hx    Heart attack Neg Hx    Diabetes Neg  Hx    Colon cancer Neg Hx    Stomach cancer Neg Hx     Social History   Socioeconomic History   Marital status: Married    Spouse name: Not on file   Number of children: 1   Years of education: Not on file   Highest education level: Not on file  Occupational History   Occupation: retired  Tobacco Use   Smoking status: Every Day    Packs/day: 2.00    Years: 50.00    Total pack years: 100.00    Types: Cigarettes   Smokeless tobacco: Never   Tobacco comments:    07/25/20   Vaping Use   Vaping Use: Never used  Substance and Sexual Activity   Alcohol use: Yes    Alcohol/week: 5.0 standard drinks of alcohol    Types: 3 Cans of beer, 2 Shots of liquor per week    Comment: daily   Drug use: No   Sexual activity: Yes  Other Topics Concern   Not on file  Social History Narrative   Regular exercise:  2-3 x weekly (sports, yardwork)   Caffeine use:  3 cups coffee daily   39 yr old son   Wife   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           Social Determinants of Health   Financial Resource Strain: Low Risk  (04/19/2021)   Overall Financial Resource Strain (CARDIA)    Difficulty of Paying Living Expenses: Not hard at all  Food Insecurity: No Food Insecurity (04/22/2022)   Hunger Vital Sign    Worried About Running Out of Food in the Last Year: Never true    Ran Out of Food in the Last Year: Never true  Transportation Needs: No Transportation Needs (04/22/2022)   PRAPARE - Administrator, Civil Service (Medical): No    Lack of Transportation (Non-Medical): No  Physical Activity: Insufficiently Active (04/19/2021)   Exercise Vital Sign    Days of Exercise per Week: 7 days    Minutes of Exercise per Session: 20 min  Stress: No Stress Concern Present (04/19/2021)   [employer] of Occupational Health - Occupational Stress Questionnaire    Feeling of Stress : Not at all  Social Connections: Moderately Integrated (04/19/2021)   Social Connection and Isolation Panel [NHANES]    Frequency of Communication with Friends and Family: More than three times a week    Frequency of Social Gatherings with Friends and Family: More than three times a week    Attends Religious Services: More than 4 times per year    Active Member of [employer] or Organizations: No    Attends Banker Meetings: Never    Marital Status: Married  Catering manager Violence: Not At Risk (04/22/2022)   Humiliation, Afraid, Rape,  and Kick questionnaire    Fear of Current or Ex-Partner: No    Emotionally Abused: No    Physically Abused: No    Sexually Abused: No    Outpatient Medications Prior to Visit  Medication Sig Dispense Refill   Multiple Vitamin (MULTIVITAMIN) tablet Take 1 tablet by mouth daily.     tamsulosin (FLOMAX) 0.4 MG CAPS capsule Take 0.4 mg by mouth daily.     No facility-administered medications prior to visit.    Allergies  Allergen Reactions   Acyclovir And Related Rash    Rash that looked like chicken pox  Review of Systems  Constitutional:  Negative for fever.  HENT:  Negative for congestion, sinus pain and sore throat.   Respiratory:  Negative for cough, shortness of breath and wheezing.   Cardiovascular:  Negative for chest pain and palpitations.  Gastrointestinal:  Negative for blood in stool, constipation, diarrhea, nausea and vomiting.  Genitourinary:  Negative for dysuria, frequency and hematuria.  Musculoskeletal:  Negative for joint pain and myalgias.       Objective:    Physical Exam Constitutional:      General: He is not in acute distress.    Appearance: Normal appearance. He is not ill-appearing.  HENT:     Head: Normocephalic and atraumatic.     Right Ear: External ear normal.     Left Ear: External ear normal.  Eyes:     Extraocular Movements: Extraocular movements intact.     Pupils: Pupils are equal, round, and reactive to light.  Cardiovascular:     Rate and Rhythm: Normal rate and regular rhythm.     Heart sounds: Normal heart sounds. No murmur heard.    No gallop.  Pulmonary:     Effort: Pulmonary effort is normal. No respiratory distress.     Breath sounds: Normal breath sounds. No wheezing or rales.  Skin:    General: Skin is warm and dry.  Neurological:     Mental Status: He is alert and oriented to person, place, and time.  Psychiatric:        Judgment: Judgment normal.     BP 129/73 (BP Location: Right Arm, Patient Position: Sitting,  Cuff Size: Small)   Pulse (!) 101   Temp 98 F (36.7 C) (Oral)   Resp 16   Wt 136 lb (61.7 kg)   SpO2 100%   BMI 17.94 kg/m  Wt Readings from Last 3 Encounters:  04/29/22 136 lb (61.7 kg)  03/18/22 130 lb 8 oz (59.2 kg)  09/05/21 138 lb (62.6 kg)       Assessment & Plan:  Tobacco abuse Assessment & Plan: He is smoking "a couple of cigarettes" a day.  Recommended that he pick up the low dose nicotine patch OTC to begin.    Renal cell carcinoma of left kidney [facility]) Assessment & Plan: S/p partial nephrectomy Dr. Luane School 2022 with the following results:   Pathology: A. LET RENAL MASS, PARTIAL NEPHRECTOMY: Papillary renal cell carcinoma, type 1, nuclear grade 2. Tumor limited to kidney. Margins are negative for malignancy. See Cancer Protocol. pT1a B. KIDNEY, LEFT, BASE OF TUMOR #1, EXCISION: Negative for malignancy. C. KIDNEY, LEFT, BASE OF TUMOR #2, EXCISION: Negative for malignancy.    Surveillance plan per urology is annual CT scans.    Weight loss Assessment & Plan: Wt Readings from Last 3 Encounters:  04/29/22 136 lb (61.7 kg)  03/18/22 130 lb 8 oz (59.2 kg)  09/05/21 138 lb (62.6 kg)   States that he is starting to put weight back on. Has follow up with GI with ultimate plans to schedule colonoscopy.    Small cell carcinoma of right lung [facility]) Assessment & Plan: S/p chemo and radiation. Last lung CT 11/23 shows no disease progression.  He also had an MRI of his brain 7/23 which did not show any sign of brain metastasis.     36 minutes spent on today's visit.  Time was spent examining/counseling patient and reviewing outside records in care everywhere.  I,Alexis Herring,acting as a Neurosurgeon for Lemont Fillers, NP.,have documented all relevant documentation  on the behalf of Lemont Fillers, NP,as directed by  Lemont Fillers, NP while in the presence of Lemont Fillers, NP.   I, Lemont Fillers, NP, personally preformed the  services described in this documentation.  All medical record entries made by the scribe were at my direction and in my presence.  I have reviewed the chart and discharge instructions (if applicable) and agree that the record reflects my personal performance and is accurate and complete. 04/29/22   Lemont Fillers, NP

## 2022-05-07 ENCOUNTER — Ambulatory Visit (HOSPITAL_COMMUNITY)
Admission: RE | Admit: 2022-05-07 | Discharge: 2022-05-07 | Disposition: A | Discharge status: Home / Self Care | Payer: BC Managed Care – PPO | Source: Ambulatory Visit | Attending: Radiation Oncology | Admitting: Radiation Oncology

## 2022-05-07 DIAGNOSIS — C3490 Malignant neoplasm of unspecified part of unspecified bronchus or lung: Secondary | ICD-10-CM | POA: Insufficient documentation

## 2022-05-07 DIAGNOSIS — G319 Degenerative disease of nervous system, unspecified: Secondary | ICD-10-CM | POA: Diagnosis not present

## 2022-05-07 MED ORDER — GADOBUTROL 1 MMOL/ML IV SOLN
7.0000 mL | Freq: Once | INTRAVENOUS | Status: AC | PRN
  Administered 2022-05-07: 7 mL via INTRAVENOUS

## 2022-05-12 ENCOUNTER — Ambulatory Visit
Admission: RE | Admit: 2022-05-12 | Discharge: 2022-05-12 | Disposition: A | Discharge status: Home / Self Care | Payer: Medicare Other | Source: Ambulatory Visit | Attending: Radiation Oncology | Admitting: Radiation Oncology

## 2022-05-12 ENCOUNTER — Inpatient Hospital Stay: Payer: BC Managed Care – PPO | Attending: Physician Assistant

## 2022-05-12 ENCOUNTER — Encounter: Payer: Self-pay | Admitting: Radiation Oncology

## 2022-05-12 ENCOUNTER — Other Ambulatory Visit: Payer: Self-pay | Admitting: Radiation Therapy

## 2022-05-12 DIAGNOSIS — C3491 Malignant neoplasm of unspecified part of right bronchus or lung: Secondary | ICD-10-CM

## 2022-05-12 DIAGNOSIS — C3411 Malignant neoplasm of upper lobe, right bronchus or lung: Secondary | ICD-10-CM | POA: Diagnosis not present

## 2022-05-12 DIAGNOSIS — Z87891 Personal history of nicotine dependence: Secondary | ICD-10-CM | POA: Diagnosis not present

## 2022-05-12 DIAGNOSIS — C642 Malignant neoplasm of left kidney, except renal pelvis: Secondary | ICD-10-CM

## 2022-05-12 DIAGNOSIS — C3490 Malignant neoplasm of unspecified part of unspecified bronchus or lung: Secondary | ICD-10-CM

## 2022-05-12 NOTE — Progress Notes (Signed)
Radiation Oncology         [phone] ________________________________   Outpatient Follow Up - Conducted via telephone at patient request.  I spoke with the patient to conduct this consult visit via telephone. The patient was notified in advance and was offered an in person or telemedicine meeting to allow for face to face communication but instead preferred to proceed with a telephone visit.     Name: Ian Hart        MRN: [id number]  Date of Service: 05/12/2022 DOB: 1950/11/11  CC:O'Sullivan, Lenna Sciara, NP    REFERRING PHYSICIAN: Debbrah Alar, NP   DIAGNOSIS: The primary encounter diagnosis was Small cell carcinoma of right lung ([place]). A diagnosis of Renal cell carcinoma of left kidney [facility]) was also pertinent to this visit.   HISTORY OF PRESENT ILLNESS: Ian Hart is a 71 y.o. male with a diagnosis of lung cancer.  He was found to have a spiculated 13.6 mm nodule in the right upper lobe on lung cancer screening scan, and he subsequently underwent a PET scan on 03/01/2020 which revealed hypermetabolism within the right upper lobe nodule as well as an isolated hypermetabolic subcarinal lymph nodes suspicious for nodal disease.  No other extrathoracic hypermetabolic metastases were identified, a left renal lesion which was indeterminate but hypermetabolic was also seen and possibly suspicious for renal cell carcinoma.  He underwent a CT super D scan on 03/22/2020 and subsequent bronchoscopy.  Interestingly bronchoscopy confirmed adenocarcinoma with in the lung nodule and small cell carcinoma within the 7 station lymph node.  He proceeded with chemoRT between 04/17/20 and 06/01/20. He also proceeded with prophylactic cranial irradiation to reduce risks of brain disease. 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 He had a partial left nephrectomy at [facility] for a grade 2 papillary renal cell carcinoma that had negative  margins.  He has been without disease since his treatment, and has been followed in surveillance with Dr. Julien Nordmann. During his last visit, we had referred him to ENT for mastoid effusion, but ultimately his hearing improved and he did not feel that he needed the appointment. His most recent MRI of the brain on 05/07/22 showed no intracranial disease. He did have small bilateral mastoid effusions. He's contacted today by phone to review these results.   On review of systems, he reports he is doing very well. He's trying to get back to baseline and has gained some of the weight he lost in the past few years back. He reports his hearing has improved since our last visit, and he'd prefer to forgo ENT evaluation unless this worsened. No complaints of headaches, visual, or auditory changes are noted, nor are there complaints of changes in movement or speech.   PREVIOUS RADIATION THERAPY:   08/20/2020 through 08/31/2020 Site Technique Total Dose (Gy) Dose per Fx (Gy) Completed Fx Beam Energies  Brain: Brain Complex 25/25 2.5 10/10 6X     Radiation Treatment Dates: 04/17/2020 through 06/01/2020 Site Technique Total Dose (Gy) Dose per Fx (Gy) Completed Fx Beam Energies  Lung, Right: Lung_Rt 3D 60/60 2 30/30 6X, 10X  Lung, Right: Lung_Rt_Bst 3D 6/6 2 3/3 6X, 10X     PAST MEDICAL HISTORY:  Past Medical History:  Diagnosis Date   History of chicken pox    History of shingles 04/2010   Lung cancer ([place]) 02/2020       PAST SURGICAL HISTORY: Past Surgical History:  Procedure Laterality  Date   BRONCHIAL BIOPSY  03/22/2020   Procedure: BRONCHIAL BIOPSIES;  Surgeon: Garner Nash, DO;  Location: [place] [facility];  Service: Pulmonary;;   BRONCHIAL BRUSHINGS  03/22/2020   Procedure: BRONCHIAL BRUSHINGS;  Surgeon: Garner Nash, DO;  Location: [place] [facility];  Service: Pulmonary;;   BRONCHIAL NEEDLE ASPIRATION BIOPSY  03/22/2020   Procedure: BRONCHIAL NEEDLE ASPIRATION BIOPSIES;  Surgeon: Garner Nash,  DO;  Location: [place] [facility];  Service: Pulmonary;;   BRONCHIAL WASHINGS  03/22/2020   Procedure: BRONCHIAL WASHINGS;  Surgeon: Garner Nash, DO;  Location: [place];  Service: Pulmonary;;   FIDUCIAL MARKER PLACEMENT  03/22/2020   Procedure: FIDUCIAL MARKER PLACEMENT;  Surgeon: Garner Nash, DO;  Location: [place] [facility];  Service: Pulmonary;;   NO PAST SURGERIES     ROBOTIC ASSITED PARTIAL NEPHRECTOMY Left 2022   VIDEO BRONCHOSCOPY WITH ENDOBRONCHIAL NAVIGATION N/A 03/22/2020   Procedure: VIDEO BRONCHOSCOPY WITH ENDOBRONCHIAL NAVIGATION;  Surgeon: Garner Nash, DO;  Location: [place];  Service: Pulmonary;  Laterality: N/A;   VIDEO BRONCHOSCOPY WITH ENDOBRONCHIAL ULTRASOUND N/A 03/22/2020   Procedure: VIDEO BRONCHOSCOPY WITH ENDOBRONCHIAL ULTRASOUND;  Surgeon: Garner Nash, DO;  Location: [place];  Service: Pulmonary;  Laterality: N/A;     FAMILY HISTORY:  Family History  Problem Relation Age of Onset   Sudden death Mother    Rheumatic fever Mother    Hypertension Neg Hx    Hyperlipidemia Neg Hx    Heart attack Neg Hx    Diabetes Neg Hx    Colon cancer Neg Hx    Stomach cancer Neg Hx      SOCIAL HISTORY:  reports that he has been smoking cigarettes. He has a 100.00 pack-year smoking history. He has never used smokeless tobacco. He reports current alcohol use of about 5.0 standard drinks of alcohol per week. He reports that he does not use drugs. The patient is married and lives in [place]. He's retired from working in Engineer, mining for [employer].    ALLERGIES: Acyclovir and related   MEDICATIONS:  Current Outpatient Medications  Medication Sig Dispense Refill   Multiple Vitamin (MULTIVITAMIN) tablet Take 1 tablet by mouth daily.     tamsulosin (FLOMAX) 0.4 MG CAPS capsule Take 0.4 mg by mouth daily.     No current facility-administered medications for this visit.     PHYSICAL EXAM:  Unable to assess due to encounter type.  ECOG =  1  0 - Asymptomatic (Fully active, able to carry on all predisease activities without restriction)  1 - Symptomatic but completely ambulatory (Restricted in physically strenuous activity but ambulatory and able to carry out work of a light or sedentary nature. For example, light housework, office work)  2 - Symptomatic, <50% in bed during the day (Ambulatory and capable of all self care but unable to carry out any work activities. Up and about more than 50% of waking hours)  3 - Symptomatic, >50% in bed, but not bedbound (Capable of only limited self-care, confined to bed or chair 50% or more of waking hours)  4 - Bedbound (Completely disabled. Cannot carry on any self-care. Totally confined to bed or chair)  5 - Death   Eustace Pen MM, Creech RH, Tormey DC, et al. [phone]). "Toxicity and response criteria of the [facility]". [place] Oncol. 5 (6): 649-55    LABORATORY DATA:  Lab Results  Component Value Date   WBC 5.3 03/10/2022   HGB 13.3 03/10/2022  HCT 38.2 (L) 03/10/2022   MCV 95.7 03/10/2022   PLT 173 03/10/2022   Lab Results  Component Value Date   NA 130 (L) 03/10/2022   K 4.2 03/10/2022   CL 95 (L) 03/10/2022   CO2 28 03/10/2022   Lab Results  Component Value Date   ALT 15 03/10/2022   AST 21 03/10/2022   ALKPHOS 73 03/10/2022   BILITOT 0.7 03/10/2022      RADIOGRAPHY: MR Brain W Wo Contrast  Result Date: 05/07/2022 CLINICAL DATA:  Small-cell lung cancer. Status post prophylactic cranial irradiation in 08/2020. EXAM: MRI HEAD WITHOUT AND WITH CONTRAST TECHNIQUE: Multiplanar, multiecho pulse sequences of the brain and surrounding structures were obtained without and with intravenous contrast. CONTRAST:  24mL GADAVIST GADOBUTROL 1 MMOL/ML IV SOLN COMPARISON:  Head MRI 11/01/2021 FINDINGS: Brain: There is no evidence of an acute infarct, mass, midline shift, or extra-axial fluid collection. There is mild-to-moderate cerebral and cerebellar  atrophy. A small focus of susceptibility artifact along the frontal horn of the left lateral ventricle is unchanged. T2 hyperintensities in the cerebral white matter are unchanged and nonspecific but compatible with mild chronic small vessel ischemic disease. No abnormal enhancement is identified. Vascular: Major intracranial vascular flow voids are preserved. Skull and upper cervical spine: Unremarkable bone marrow signal. Sinuses/Orbits: Unremarkable orbits. Clear paranasal sinuses. Small bilateral mastoid effusions. Other: None. IMPRESSION: No evidence of intracranial metastases. Electronically Signed   By: Logan Bores M.D.   On: 05/07/2022 17:45        IMPRESSION/PLAN: 1. 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 The patient is doing well radiographically with his restaging imaging ordered by medical oncology, as well as within the brain. We discussed continued surveillance at 6 month intervals. He would like to extend this if it becomes appropriate. I told him we could consider this after a few more years without recurrence. 2. 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 He continues to follow up with [facility] in [facility] in surveillance. We will follow this expectantly. 3. Bilateral mastoid effusions with hearing loss. Since our last visit, his symptoms have improved, but if he would like ENT referral if this worsens he will let me know and we can coordinate this for him.   This encounter was conducted via telephone.  The patient has provided two factor identification and has given verbal consent for this type of encounter and has been advised to only accept a meeting of this type in a secure network environment. The time spent during this encounter was 35 minutes including preparation, discussion, and coordination of the patient's care. The attendants for this meeting include  Hayden Pedro  and Chad Cordial.  During the encounter, Hayden Pedro were located at [facility] Radiation Oncology Department.  Ian Hart was located at home.     The above documentation reflects my direct findings during this shared patient visit. Please see the separate note by Dr. Lisbeth Renshaw on this date for the remainder of the patient's plan of care.    Carola Rhine, PAC

## 2022-05-12 NOTE — Progress Notes (Signed)
Telephone nursing interview for Small cell carcinoma of right lung ([place]). A diagnosis of Renal cell carcinoma of left kidney [facility]) was also pertinent to this visit. Patient is to receive their most recent MRI results from 05/07/22.  I verified patient's identity and began nursing interview. Patient reports doing well. No complaints at this time.  Meaningful use complete.  Patient aware of their 1:00pm-05/12/2022 telephone appointment w/ Shona Simpson PA-C. I left my extension [phone] in case patient needs anything. Patient verbalized understanding.  This concludes the interview.   Leandra Kern, LPN

## 2022-05-13 ENCOUNTER — Encounter: Payer: Self-pay | Admitting: Gastroenterology

## 2022-05-13 ENCOUNTER — Ambulatory Visit (INDEPENDENT_AMBULATORY_CARE_PROVIDER_SITE_OTHER): Payer: Medicare Other | Admitting: Gastroenterology

## 2022-05-13 VITALS — BP 110/68 | HR 97 | Ht 73.0 in | Wt 136.2 lb

## 2022-05-13 DIAGNOSIS — Z1211 Encounter for screening for malignant neoplasm of colon: Secondary | ICD-10-CM | POA: Diagnosis not present

## 2022-05-13 DIAGNOSIS — Z1212 Encounter for screening for malignant neoplasm of rectum: Secondary | ICD-10-CM | POA: Diagnosis not present

## 2022-05-13 DIAGNOSIS — C3491 Malignant neoplasm of unspecified part of right bronchus or lung: Secondary | ICD-10-CM

## 2022-05-13 NOTE — Progress Notes (Signed)
HPI : Ian Hart is a very pleasant 72 year old male with small cell lung cancer who is referred to [name] by Debbrah Alar, NP for colon cancer screening.  The patient underwent his initial screening colonoscopy in September 2013.  The colonoscopy was notable for sigmoid diverticulosis, but was otherwise normal.  No polyps were seen.  He was recommended to repeat colonoscopy in 10 years.    The patient denies any family history of colon cancer.    The patient denies any bothersome or concerning GI symptoms.  Specifically, he denies abdominal pain, constipation or diarrhea, blood in the stool or incontinence.  He reports regular bowel movements, typically 1 or 2/day.  His stools are typically formed and solid.  He denies any chronic upper GI symptoms such as frequent heartburn/acid regurgitation, nausea, vomiting or difficulty swallowing.  He did have severe dysphagia and odynophagia following radiation therapy for his lung cancer, but this is resolved. He also reports losing significant weight during his cancer treatment, but he is slowly regaining it back.  The patient was diagnosed with lung cancer in December 2021.  He completed systemic chemotherapy as well as radiation therapy.  Subsequent imaging has shown no progression of disease.  He was also noted to have a suspicious lesion in the kidney, which was eventually resected and consistent with renal cell carcinoma.  He did not require any further treatment for his kidney cancer.  He also received empiric brain radiation.  The patient is not currently planned for any further treatment of his lung cancer.  His oncologist is monitoring with CT scans every 6 months, most recent imaging from November 2023.   Colonoscopy January 09, 2012 (Dr. Olevia Perches) Left-sided diverticulosis, otherwise normal Repeat 10 years  CT chest March 10, 2022  IMPRESSION: 1. Stable exam. No new or progressive findings. 2. Stable post radiation scarring in the  parahilar right lung. The index right upper lobe nodular density described on previous studies is stable in the interval. 3. Mild circumferential wall thickening mid esophagus, similar to prior. This may well be treatment related. 4.  Emphysema (ICD10-J43.9).  Past Medical History:  Diagnosis Date   History of chicken pox    History of shingles 04/2010   Lung cancer ([facility]) 02/2020     Past Surgical History:  Procedure Laterality Date   BRONCHIAL BIOPSY  03/22/2020   Procedure: BRONCHIAL BIOPSIES;  Surgeon: Garner Nash, DO;  Location: [facility];  Service: Pulmonary;;   BRONCHIAL BRUSHINGS  03/22/2020   Procedure: BRONCHIAL BRUSHINGS;  Surgeon: Garner Nash, DO;  Location: [facility];  Service: Pulmonary;;   BRONCHIAL NEEDLE ASPIRATION BIOPSY  03/22/2020   Procedure: BRONCHIAL NEEDLE ASPIRATION BIOPSIES;  Surgeon: Garner Nash, DO;  Location: [facility];  Service: Pulmonary;;   BRONCHIAL WASHINGS  03/22/2020   Procedure: BRONCHIAL WASHINGS;  Surgeon: Garner Nash, DO;  Location: [facility];  Service: Pulmonary;;   FIDUCIAL MARKER PLACEMENT  03/22/2020   Procedure: FIDUCIAL MARKER PLACEMENT;  Surgeon: Garner Nash, DO;  Location: [facility];  Service: Pulmonary;;   NO PAST SURGERIES     ROBOTIC ASSITED PARTIAL NEPHRECTOMY Left 2022   VIDEO BRONCHOSCOPY WITH ENDOBRONCHIAL NAVIGATION N/A 03/22/2020   Procedure: VIDEO BRONCHOSCOPY WITH ENDOBRONCHIAL NAVIGATION;  Surgeon: Garner Nash, DO;  Location: [facility];  Service: Pulmonary;  Laterality: N/A;   VIDEO BRONCHOSCOPY WITH ENDOBRONCHIAL ULTRASOUND N/A 03/22/2020   Procedure: VIDEO BRONCHOSCOPY WITH ENDOBRONCHIAL ULTRASOUND;  Surgeon: Garner Nash, DO;  Location: [facility];  Service:  Pulmonary;  Laterality: N/A;   Family History  Problem Relation Age of Onset   Sudden death Mother    Rheumatic fever Mother    Liver disease Neg Hx    Colon cancer Neg Hx    Esophageal cancer Neg Hx     Social History   Tobacco Use   Smoking status: Every Day    Packs/day: 2.00    Years: 50.00    Total pack years: 100.00    Types: Cigarettes   Smokeless tobacco: Never   Tobacco comments:    07/25/20  Vaping Use   Vaping Use: Never used  Substance Use Topics   Alcohol use: Yes    Alcohol/week: 5.0 standard drinks of alcohol    Types: 3 Cans of beer, 2 Shots of liquor per week    Comment: daily   Drug use: No   Current Outpatient Medications  Medication Sig Dispense Refill   Multiple Vitamin (MULTIVITAMIN) tablet Take 1 tablet by mouth daily.     tamsulosin (FLOMAX) 0.4 MG CAPS capsule Take 0.4 mg by mouth daily.     No current facility-administered medications for this visit.   Allergies  Allergen Reactions   Acyclovir And Related Rash    Rash that looked like chicken pox     Review of Systems: All systems reviewed and negative except where noted in HPI.    MR Brain W Wo Contrast  Result Date: 05/07/2022 CLINICAL DATA:  Small-cell lung cancer. Status post prophylactic cranial irradiation in 08/2020. EXAM: MRI HEAD WITHOUT AND WITH CONTRAST TECHNIQUE: Multiplanar, multiecho pulse sequences of the brain and surrounding structures were obtained without and with intravenous contrast. CONTRAST:  68mL GADAVIST GADOBUTROL 1 MMOL/ML IV SOLN COMPARISON:  Head MRI 11/01/2021 FINDINGS: Brain: There is no evidence of an acute infarct, mass, midline shift, or extra-axial fluid collection. There is mild-to-moderate cerebral and cerebellar atrophy. A small focus of susceptibility artifact along the frontal horn of the left lateral ventricle is unchanged. T2 hyperintensities in the cerebral white matter are unchanged and nonspecific but compatible with mild chronic small vessel ischemic disease. No abnormal enhancement is identified. Vascular: Major intracranial vascular flow voids are preserved. Skull and upper cervical spine: Unremarkable bone marrow signal. Sinuses/Orbits: Unremarkable  orbits. Clear paranasal sinuses. Small bilateral mastoid effusions. Other: None. IMPRESSION: No evidence of intracranial metastases. Electronically Signed   By: Logan Bores M.D.   On: 05/07/2022 17:45    Physical Exam: BP 110/68   Pulse 97   Ht 6\' 1"  (1.854 m)   Wt 136 lb 3.2 oz (61.8 kg)   SpO2 98%   BMI 17.97 kg/m  Constitutional: Pleasant,well-developed, thin Caucasian male in no acute distress. HEENT: Normocephalic and atraumatic. Conjunctivae are normal. No scleral icterus.  Very poor dentition Neck supple.  Cardiovascular: Normal rate, regular rhythm.  Pulmonary/chest: Effort normal and breath sounds normal. No wheezing, rales or rhonchi. Abdominal: Soft, nondistended, nontender. Bowel sounds active throughout. There are no masses palpable. No hepatomegaly. Extremities: no edema Neurological: Alert and oriented to person place and time. Skin: Skin is warm and dry. No rashes noted. Psychiatric: Normal mood and affect. Behavior is normal.  CBC    Component Value Date/Time   WBC 5.3 03/10/2022 1008   WBC 7.1 03/20/2020 1301   RBC 3.99 (L) 03/10/2022 1008   HGB 13.3 03/10/2022 1008   HCT 38.2 (L) 03/10/2022 1008   PLT 173 03/10/2022 1008   MCV 95.7 03/10/2022 1008   MCH 33.3 03/10/2022 1008  MCHC 34.8 03/10/2022 1008   RDW 12.5 03/10/2022 1008   LYMPHSABS 0.7 03/10/2022 1008   MONOABS 0.6 03/10/2022 1008   EOSABS 0.2 03/10/2022 1008   BASOSABS 0.1 03/10/2022 1008    CMP     Component Value Date/Time   NA 130 (L) 03/10/2022 1008   K 4.2 03/10/2022 1008   CL 95 (L) 03/10/2022 1008   CO2 28 03/10/2022 1008   GLUCOSE 102 (H) 03/10/2022 1008   BUN 16 03/10/2022 1008   CREATININE 1.14 03/10/2022 1008   CREATININE 0.86 11/12/2011 0917   CALCIUM 9.8 03/10/2022 1008   PROT 7.2 03/10/2022 1008   ALBUMIN 4.7 03/10/2022 1008   AST 21 03/10/2022 1008   ALT 15 03/10/2022 1008   ALKPHOS 73 03/10/2022 1008   BILITOT 0.7 03/10/2022 1008   GFRNONAA >60 03/10/2022 1008      ASSESSMENT AND PLAN: 72 year old male with lung cancer status posttreatment with chemotherapy and radiation and empiric brain radiation, as well as localized kidney cancer status post partial nephrectomy due for colon cancer screening.  The patient is average risk for colon cancer based on normal colonoscopy in 2013 and absence of family history of colon cancer.  He does not have any concerning GI symptoms such as change in bowel habits or blood in the stool.  The patient would prefer not to undergo another colonoscopy if possible.  Because he is average risk for colon cancer, he is not candidate for stool based screening test.  The details, risks, and benefits of colonoscopy versus stool based test were discussed with the patient.  He would like to proceed with a stool based test.  Will order Cologuard.  The patient is aware that if his Cologuard is positive, he will need to proceed with a colonoscopy at that time.  Colon cancer screening - Cologuard  Trennon Torbeck E. Candis Schatz, MD [facility]  CC:  Debbrah Alar, NP

## 2022-05-13 NOTE — Patient Instructions (Addendum)
If you are age 72 or older, your body mass index should be between 23-30. Your Body mass index is 17.97 kg/m. If this is out of the aforementioned range listed, please consider follow up with your Primary Care Provider.  If you are age 59 or younger, your body mass index should be between 19-25. Your Body mass index is 17.97 kg/m. If this is out of the aformentioned range listed, please consider follow up with your Primary Care Provider.   Your provider has ordered Cologuard testing as an option for colon cancer screening. This is performed by [employer] and may be out of network with your insurance. PRIOR to completing the test, it is YOUR responsibility to contact your insurance about covered benefits for this test. Your out of pocket expense could be anywhere from $0.00 to $649.00.   When you call to check coverage with your insurer, please provide the following information:   -The ONLY provider of Cologuard is [place] code for Cologuard is [phone].  [employer] NPI # [id number]  -[employer] Tax ID # [id number]   We have already sent your demographic and insurance information to [employer] (phone number [phone]) and they should contact you within the next week regarding your test. If you have not heard from them within the next week, please call our office at [phone].   The [facility] providers would like to encourage you to use [facility] to communicate with providers for non-urgent requests or questions.  Due to long hold times on the telephone, sending your provider a message by [facility] may be a faster and more efficient way to get a response.  Please allow 48 business hours for a response.  Please remember that this is for non-urgent requests.   It was a pleasure to see you today!  Thank you for trusting me with your gastrointestinal care!    Scott E.Candis Schatz, MD

## 2022-06-04 ENCOUNTER — Telehealth: Payer: Self-pay | Admitting: Gastroenterology

## 2022-06-04 NOTE — Telephone Encounter (Signed)
Spoke with pt and let him know the company received the specimen on 2/9 and the test is in process.

## 2022-06-04 NOTE — Telephone Encounter (Signed)
Patient is calling to check up on results from cologaurd test, patient is worried because it has been so long. Please advise

## 2022-06-05 LAB — COLOGUARD
COLOGUARD: NEGATIVE
Cologuard: NEGATIVE

## 2022-06-06 NOTE — Progress Notes (Signed)
Mr. Gearlds,  Your Cologuard test was negative (normal).  You would be due for colon cancer screening again in 3 years.  The risks and benefits of colon cancer screening should be discussed with your primary care provider at that time, based on your relative health at that time.

## 2022-07-30 ENCOUNTER — Encounter: Payer: Self-pay | Admitting: Family

## 2022-07-30 ENCOUNTER — Ambulatory Visit (INDEPENDENT_AMBULATORY_CARE_PROVIDER_SITE_OTHER): Payer: BC Managed Care – PPO | Admitting: Family

## 2022-07-30 VITALS — BP 121/55 | HR 79 | Temp 97.8°F | Resp 16 | Ht 73.0 in | Wt 138.0 lb

## 2022-07-30 DIAGNOSIS — Z0000 Encounter for general adult medical examination without abnormal findings: Secondary | ICD-10-CM

## 2022-07-30 DIAGNOSIS — Z125 Encounter for screening for malignant neoplasm of prostate: Secondary | ICD-10-CM | POA: Diagnosis not present

## 2022-07-30 DIAGNOSIS — E871 Hypo-osmolality and hyponatremia: Secondary | ICD-10-CM | POA: Diagnosis not present

## 2022-07-30 LAB — PSA: PSA: 4.53 ng/mL — ABNORMAL HIGH (ref 0.10–4.00)

## 2022-07-30 LAB — BASIC METABOLIC PANEL
BUN: 15 mg/dL (ref 6–23)
CO2: 28 mEq/L (ref 19–32)
Calcium: 9.2 mg/dL (ref 8.4–10.5)
Chloride: 95 mEq/L — ABNORMAL LOW (ref 96–112)
Creatinine, Ser: 1.03 mg/dL (ref 0.40–1.50)
GFR: 72.76 mL/min (ref >60.00)
Glucose, Bld: 103 mg/dL — ABNORMAL HIGH (ref 70–99)
Potassium: 4.3 mEq/L (ref 3.5–5.1)
Sodium: 130 mEq/L — ABNORMAL LOW (ref 135–145)

## 2022-07-30 MED ORDER — TETANUS-DIPHTHERIA TOXOIDS TD 2-2 LF/0.5ML IM SUSP: 0.5000 mL | Freq: Once | INTRAMUSCULAR | 0 refills | Status: AC

## 2022-07-30 MED ORDER — VARENICLINE TARTRATE (STARTER) 0.5 MG X 11 & 1 MG X 42 PO TBPK: ORAL_TABLET | ORAL | 0 refills | Status: DC

## 2022-07-30 MED ORDER — SHINGRIX 50 MCG/0.5ML IM SUSR: INTRAMUSCULAR | 1 refills | Status: AC

## 2022-07-30 NOTE — Assessment & Plan Note (Signed)
Wt Readings from Last 3 Encounters:  07/30/22 138 lb (62.6 kg)  05/13/22 136 lb 3.2 oz (61.8 kg)  04/29/22 136 lb (61.7 kg)   Lab Results  Component Value Date   PSA 2.31 02/03/2014   PSA 1.58 11/12/2011   Td and Shigrix rx's sent to his pharmacy.  We discussed increasing his protein shake to twice daily instead of once daily to try to promote weight gain.   Colon cancer screening is up to date.  Check PSA.

## 2022-07-30 NOTE — Progress Notes (Signed)
Subjective:   By signing my name below, I, Barrett Shell, attest that this documentation has been prepared under the direction and in the presence of [name], NP. 07/30/2022   Patient ID: Ian Hart, male    DOB: 1951-03-21, 72 y.o.   MRN: [id number]  No chief complaint on file.   HPI Patient is in today for a comprehensive physical exam.   Blood pressure: His diastolic blood pressure is low during this visit. BP Readings from Last 3 Encounters:  07/30/22 (!) 121/55  05/13/22 110/68  04/29/22 129/73   Pulse Readings from Last 3 Encounters:  07/30/22 79  05/13/22 97  04/29/22 (!) 101   Stability: He feels unstable at times when he stands up.   Right knee pain: He has intermittent right knee pain when he is walking.    Audiology: He had an appointment with an audiologist recently. He was referred to get hearing aides.  Urinary: He gets up every two hours to urinate during the night time. He does not have frequency during the day, but has difficulty emptying his bladder completely. He takes 0.4 mg Flomax daily PO.   Acute: He denies fever, unexpected weight change, adenopathy, new moles, sinus pain, sore throat, visual disturbance, chest pain, palpitations, leg swelling, cough, shortness of breath, wheezing, nausea, vomiting, diarrhea, constipation, blood in stool, dysuria, hematuria, new muscle pain, headaches, depression or anxiety at this time.   Colonoscopy: Last colonoscopy completed on 01/09/2012. Mild diverticulosis of the sigmoid colon found, otherwise results are normal. Repeat in 10 years. He recently completed colo guard. Results are normal. Repeat in 3 years.   Dexa: Last Dexa completed on 06/16/2014. The BMD is low according to the [facility] classification for osteoporosis. Repeat BMD is appropriate after 2 years or after 1-2 years if starting treatment.   PSA: Last PSA completed on 11/12/2011. Blood sugar is found to be elevated and sodium is slightly low,  otherwise findings are normal.   Immunizations: He has not yet received the Shingrix immunization. He has not yet received the RSV immunization  Social history: His brother passed from emphysema. His other brother died from Covid-67. His paternal grandmother lived to age 29. His mother passed when he was [age]. His dad lived to be 101. He smokes tobacco very rarely. He has not tried using chantix in the past and is interesting in using it.   Diet: He does not have a large appetite. He drinks one ensure nutritional shake a day.   Exercise: He walks regularly. He sometimes walks up 15,000 steps a day.   Dental: He is not UTD on dental.  Vision: He is UTD on vision.     Past Medical History:  Diagnosis Date   History of chicken pox    History of shingles 04/2010   Lung cancer ([facility]) 02/2020    Past Surgical History:  Procedure Laterality Date   BRONCHIAL BIOPSY  03/22/2020   Procedure: BRONCHIAL BIOPSIES;  Surgeon: Josephine Igo, DO;  Location: [facility];  Service: Pulmonary;;   BRONCHIAL BRUSHINGS  03/22/2020   Procedure: BRONCHIAL BRUSHINGS;  Surgeon: Josephine Igo, DO;  Location: [facility];  Service: Pulmonary;;   BRONCHIAL NEEDLE ASPIRATION BIOPSY  03/22/2020   Procedure: BRONCHIAL NEEDLE ASPIRATION BIOPSIES;  Surgeon: Josephine Igo, DO;  Location: [facility];  Service: Pulmonary;;   BRONCHIAL WASHINGS  03/22/2020   Procedure: BRONCHIAL WASHINGS;  Surgeon: Josephine Igo, DO;  Location: [facility];  Service: Pulmonary;;  FIDUCIAL MARKER PLACEMENT  03/22/2020   Procedure: FIDUCIAL MARKER PLACEMENT;  Surgeon: Josephine Igo, DO;  Location: [facility];  Service: Pulmonary;;   NO PAST SURGERIES     ROBOTIC ASSITED PARTIAL NEPHRECTOMY Left 2022   VIDEO BRONCHOSCOPY WITH ENDOBRONCHIAL NAVIGATION N/A 03/22/2020   Procedure: VIDEO BRONCHOSCOPY WITH ENDOBRONCHIAL NAVIGATION;  Surgeon: Josephine Igo, DO;  Location: [facility];  Service: Pulmonary;   Laterality: N/A;   VIDEO BRONCHOSCOPY WITH ENDOBRONCHIAL ULTRASOUND N/A 03/22/2020   Procedure: VIDEO BRONCHOSCOPY WITH ENDOBRONCHIAL ULTRASOUND;  Surgeon: Josephine Igo, DO;  Location: [facility];  Service: Pulmonary;  Laterality: N/A;    Family History  Problem Relation Age of Onset   Sudden death Mother    Rheumatic fever Mother    Liver disease Neg Hx    Colon cancer Neg Hx    Esophageal cancer Neg Hx     Social History   Socioeconomic History   Marital status: Married    Spouse name: Not on file   Number of children: 1   Years of education: Not on file   Highest education level: Not on file  Occupational History   Occupation: retired  Tobacco Use   Smoking status: Every Day    Packs/day: 2.00    Years: 50.00    Additional pack years: 0.00    Total pack years: 100.00    Types: Cigarettes   Smokeless tobacco: Never   Tobacco comments:    07/25/20  Vaping Use   Vaping Use: Never used  Substance and Sexual Activity   Alcohol use: Yes    Alcohol/week: 5.0 standard drinks of alcohol    Types: 3 Cans of beer, 2 Shots of liquor per week    Comment: daily   Drug use: No   Sexual activity: Yes  Other Topics Concern   Not on file  Social History Narrative   Regular exercise:  2-3 x weekly (sports, yardwork)   Caffeine use:  3 cups coffee daily   42 yr old son   Wife   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           Social Determinants of Health   Financial Resource Strain: Low Risk  (04/19/2021)   Overall Financial Resource Strain (CARDIA)    Difficulty of Paying Living Expenses: Not hard at all  Food Insecurity: No Food Insecurity (04/22/2022)   Hunger Vital Sign    Worried About Running Out of Food in the Last Year: Never true    Ran Out of Food in the Last Year: Never true  Transportation Needs: No Transportation Needs (04/22/2022)   PRAPARE - Administrator, Civil Service (Medical): No    Lack of  Transportation (Non-Medical): No  Physical Activity: Insufficiently Active (04/19/2021)   Exercise Vital Sign    Days of Exercise per Week: 7 days    Minutes of Exercise per Session: 20 min  Stress: No Stress Concern Present (04/19/2021)   [employer] of Occupational Health - Occupational Stress Questionnaire    Feeling of Stress : Not at all  Social Connections: Moderately Integrated (04/19/2021)   Social Connection and Isolation Panel [NHANES]    Frequency of Communication with Friends and Family: More than three times a week    Frequency of Social Gatherings with Friends and Family: More than three times a week    Attends Religious Services: More than 4 times per year    Active  Member of Clubs or Organizations: No    Attends Banker Meetings: Never    Marital Status: Married  Catering manager Violence: Not At Risk (04/22/2022)   Humiliation, Afraid, Rape, and Kick questionnaire    Fear of Current or Ex-Partner: No    Emotionally Abused: No    Physically Abused: No    Sexually Abused: No    Outpatient Medications Prior to Visit  Medication Sig Dispense Refill   Multiple Vitamin (MULTIVITAMIN) tablet Take 1 tablet by mouth daily.     tamsulosin (FLOMAX) 0.4 MG CAPS capsule Take 0.4 mg by mouth daily.     No facility-administered medications prior to visit.    Allergies  Allergen Reactions   Acyclovir And Related Rash    Rash that looked like chicken pox    Review of Systems  Constitutional:  Negative for fever.       (-) unexpected weight change  HENT:  Negative for sinus pain and sore throat.   Eyes:        (-) visual disturbances  Respiratory:  Negative for cough, shortness of breath and wheezing.   Cardiovascular:  Negative for chest pain, palpitations and leg swelling.  Gastrointestinal:  Negative for blood in stool, constipation, diarrhea, nausea and vomiting.  Genitourinary:  Positive for frequency (at night). Negative for dysuria and hematuria.   Musculoskeletal:  Positive for joint pain (right knee).       (-) new muscle pain  Skin:        (-) new moles  Neurological:  Negative for headaches.  Psychiatric/Behavioral:  Negative for depression.        (-) anxiety       Objective:    Physical Exam Constitutional:      General: He is not in acute distress.    Appearance: Normal appearance.  HENT:     Head: Normocephalic and atraumatic.     Right Ear: Tympanic membrane, ear canal and external ear normal.     Left Ear: Tympanic membrane, ear canal and external ear normal.  Eyes:     Extraocular Movements: Extraocular movements intact.     Right eye: No nystagmus.     Left eye: No nystagmus.     Pupils: Pupils are equal, round, and reactive to light.  Cardiovascular:     Rate and Rhythm: Normal rate and regular rhythm.     Heart sounds: Normal heart sounds. No murmur heard.    No gallop.  Pulmonary:     Effort: Pulmonary effort is normal. No respiratory distress.     Breath sounds: No wheezing or rales.  Abdominal:     General: Bowel sounds are normal. There is no distension.     Palpations: Abdomen is soft.     Tenderness: There is no abdominal tenderness. There is no guarding.  Musculoskeletal:     Cervical back: Normal range of motion.     Comments: (+) 5/5 strength in upper and lower extremities  Lymphadenopathy:     Cervical: No cervical adenopathy.  Skin:    General: Skin is warm.  Neurological:     Mental Status: He is alert and oriented to person, place, and time.     Deep Tendon Reflexes:     Reflex Scores:      Patellar reflexes are 2+ on the right side and 2+ on the left side. Psychiatric:        Judgment: Judgment normal.     There were no vitals taken for  this visit. Wt Readings from Last 3 Encounters:  05/13/22 136 lb 3.2 oz (61.8 kg)  04/29/22 136 lb (61.7 kg)  03/18/22 130 lb 8 oz (59.2 kg)       Assessment & Plan:  There are no diagnoses linked to this encounter.  I, Barrett Shell,  personally preformed the services described in this documentation.  All medical record entries made by the scribe were at my direction and in my presence.  I have reviewed the chart and discharge instructions (if applicable) and agree that the record reflects my personal performance and is accurate and complete. 07/30/2022  Barrett Shell  [facility] [facility] as a scribe for Lemont Fillers, NP.,have documented all relevant documentation on the behalf of Lemont Fillers, NP,as directed by  Lemont Fillers, NP while in the presence of Lemont Fillers, NP.

## 2022-07-30 NOTE — Patient Instructions (Signed)
Please schedule routine vision exam.  

## 2022-07-31 ENCOUNTER — Telehealth: Payer: Self-pay | Admitting: Family

## 2022-07-31 ENCOUNTER — Other Ambulatory Visit: Payer: Self-pay

## 2022-07-31 DIAGNOSIS — R972 Elevated prostate specific antigen [PSA]: Secondary | ICD-10-CM

## 2022-07-31 NOTE — Telephone Encounter (Signed)
Sodium is low but stable.  His PSA is mildly elevated.  I suspect that this is due to prostate enlargement, I would like him to meet with the urologist upstairs though. Referral pended.

## 2022-07-31 NOTE — Telephone Encounter (Signed)
Patient has a establish urologist at [facility] and he has an upcoming appointment in May.  He will let them know to forward ov notes to [name].  Referral was cancelled.

## 2022-08-01 NOTE — Telephone Encounter (Signed)
Noted  

## 2022-08-29 ENCOUNTER — Encounter: Payer: Self-pay | Admitting: Physician Assistant

## 2022-09-05 ENCOUNTER — Telehealth: Payer: Self-pay | Admitting: Internal Medicine

## 2022-09-05 NOTE — Telephone Encounter (Signed)
Rescheduled 06/10 to 06/11 due to provider on-call, patient has been called and notified. 

## 2022-09-10 ENCOUNTER — Encounter: Payer: Self-pay | Admitting: Physician Assistant

## 2022-09-17 ENCOUNTER — Ambulatory Visit (HOSPITAL_COMMUNITY)
Admission: RE | Admit: 2022-09-17 | Discharge: 2022-09-17 | Disposition: A | Discharge status: Home / Self Care | Payer: Medicare Other | Source: Ambulatory Visit | Attending: Physician Assistant | Admitting: Physician Assistant

## 2022-09-17 ENCOUNTER — Inpatient Hospital Stay: Payer: Medicare Other | Attending: Internal Medicine

## 2022-09-17 DIAGNOSIS — Z85118 Personal history of other malignant neoplasm of bronchus and lung: Secondary | ICD-10-CM | POA: Insufficient documentation

## 2022-09-17 DIAGNOSIS — C3490 Malignant neoplasm of unspecified part of unspecified bronchus or lung: Secondary | ICD-10-CM

## 2022-09-17 DIAGNOSIS — Z9221 Personal history of antineoplastic chemotherapy: Secondary | ICD-10-CM | POA: Insufficient documentation

## 2022-09-17 DIAGNOSIS — Z923 Personal history of irradiation: Secondary | ICD-10-CM | POA: Insufficient documentation

## 2022-09-17 LAB — CMP (CANCER CENTER ONLY)
ALT: 33 U/L (ref 0–44)
AST: 44 U/L — ABNORMAL HIGH (ref 15–41)
Albumin: 4.6 g/dL (ref 3.5–5.0)
Alkaline Phosphatase: 92 U/L (ref 38–126)
Anion gap: 9 (ref 5–15)
BUN: 17 mg/dL (ref 8–23)
CO2: 26 mmol/L (ref 22–32)
Calcium: 9.8 mg/dL (ref 8.9–10.3)
Chloride: 99 mmol/L (ref 98–111)
Creatinine: 1.13 mg/dL (ref 0.61–1.24)
GFR, Estimated: >60 mL/min (ref >60)
Glucose, Bld: 104 mg/dL — ABNORMAL HIGH (ref 70–99)
Potassium: 4.2 mmol/L (ref 3.5–5.1)
Sodium: 134 mmol/L — ABNORMAL LOW (ref 135–145)
Total Bilirubin: 0.7 mg/dL (ref 0.3–1.2)
Total Protein: 7.6 g/dL (ref 6.5–8.1)

## 2022-09-17 LAB — CBC WITH DIFFERENTIAL (CANCER CENTER ONLY)
Abs Immature Granulocytes: 0.03 10*3/uL (ref 0.00–0.07)
Basophils Absolute: 0.1 10*3/uL (ref 0.0–0.1)
Basophils Relative: 1 %
Eosinophils Absolute: 0.2 10*3/uL (ref 0.0–0.5)
Eosinophils Relative: 4 %
HCT: 40.1 % (ref 39.0–52.0)
Hemoglobin: 13.9 g/dL (ref 13.0–17.0)
Immature Granulocytes: 1 %
Lymphocytes Relative: 15 %
Lymphs Abs: 0.8 10*3/uL (ref 0.7–4.0)
MCH: 32.3 pg (ref 26.0–34.0)
MCHC: 34.7 g/dL (ref 30.0–36.0)
MCV: 93 fL (ref 80.0–100.0)
Monocytes Absolute: 0.7 10*3/uL (ref 0.1–1.0)
Monocytes Relative: 12 %
Neutro Abs: 3.6 10*3/uL (ref 1.7–7.7)
Neutrophils Relative %: 67 %
Platelet Count: 199 10*3/uL (ref 150–400)
RBC: 4.31 MIL/uL (ref 4.22–5.81)
RDW: 11.9 % (ref 11.5–15.5)
WBC Count: 5.3 10*3/uL (ref 4.0–10.5)
nRBC: 0 % (ref 0.0–0.2)

## 2022-09-17 MED ORDER — SODIUM CHLORIDE (PF) 0.9 % IJ SOLN
INTRAMUSCULAR | Status: AC
  Filled 2022-09-17: qty 50

## 2022-09-17 MED ORDER — IOHEXOL 300 MG/ML  SOLN
75.0000 mL | Freq: Once | INTRAMUSCULAR | Status: AC | PRN
  Administered 2022-09-17: 75 mL via INTRAVENOUS

## 2022-09-19 ENCOUNTER — Telehealth: Payer: Self-pay | Admitting: Medical Oncology

## 2022-09-19 NOTE — Telephone Encounter (Signed)
I told pt there is a delay in imaging results. Keep appt June 10th.

## 2022-09-22 ENCOUNTER — Ambulatory Visit: Payer: Medicare Other | Admitting: Internal Medicine

## 2022-09-22 ENCOUNTER — Inpatient Hospital Stay (HOSPITAL_BASED_OUTPATIENT_CLINIC_OR_DEPARTMENT_OTHER): Payer: Medicare Other | Admitting: Internal Medicine

## 2022-09-22 ENCOUNTER — Other Ambulatory Visit: Payer: Self-pay

## 2022-09-22 VITALS — BP 117/67 | HR 81 | Temp 98.0°F | Resp 18 | Wt 131.9 lb

## 2022-09-22 DIAGNOSIS — Z85118 Personal history of other malignant neoplasm of bronchus and lung: Secondary | ICD-10-CM | POA: Diagnosis present

## 2022-09-22 DIAGNOSIS — C3490 Malignant neoplasm of unspecified part of unspecified bronchus or lung: Secondary | ICD-10-CM | POA: Diagnosis not present

## 2022-09-22 DIAGNOSIS — Z9221 Personal history of antineoplastic chemotherapy: Secondary | ICD-10-CM | POA: Insufficient documentation

## 2022-09-22 DIAGNOSIS — Z923 Personal history of irradiation: Secondary | ICD-10-CM | POA: Diagnosis not present

## 2022-09-22 NOTE — Progress Notes (Signed)
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 3) Suspicious left renal lesion   PRIOR THERAPY: Systemic chemotherapy/radiation with cisplatin 80 mg per metered squared on day 1 and etoposide 100 mg per metered squared on days 1, 2, and 3 IV every 3 weeks.  Status post 4 cycles.  Starting from cycle #3, cisplatin was changed to carboplatin for an AUC of 5 due to renal insufficiency. First dose on 04/20/20. Onpro was added to the treatment plan starting from cycle #3 due to pancytopenia.     CURRENT THERAPY:  Observation  INTERVAL HISTORY: Ian Hart 72 y.o. male turns to the clinic today for 6 months follow-up visit accompanied by his wife.  The patient is feeling fine today with no concerning complaints.  He denied having any current chest pain, shortness of breath, cough or hemoptysis.  He has no nausea, vomiting, diarrhea or constipation.  He has no headache or visual changes.  He is very active and exercises at regular basis.  He lost few pounds recently.  The patient had repeat CT scan of the chest performed recently and is here for evaluation and discussion of his scan results.  MEDICAL HISTORY: Past Medical History:  Diagnosis Date   History of chicken pox    History of shingles 04/2010   Lung cancer (HCC) 02/2020    ALLERGIES:  is allergic to acyclovir and related.  MEDICATIONS:  Current Outpatient Medications  Medication Sig Dispense Refill   Multiple Vitamin (MULTIVITAMIN) tablet Take 1 tablet by mouth daily.     tamsulosin (FLOMAX) 0.4 MG CAPS capsule Take 0.4 mg by mouth daily.     Varenicline Tartrate, Starter, (CHANTIX STARTING MONTH PAK) 0.5 MG  X 11 & 1 MG X 42 TBPK Take one 0.5 mg tablet by mouth once daily for 3 days, then increase to one 0.5 mg tablet twice daily for 4 days, then increase to one 1 mg tablet twice daily. 53 each 0   Zoster Vaccine Adjuvanted [facility]) injection 0.35ml IM now and again in 2-6 months 0.5 mL 1   No current facility-administered medications for this visit.    SURGICAL HISTORY:  Past Surgical History:  Procedure Laterality Date   BRONCHIAL BIOPSY  03/22/2020   Procedure: BRONCHIAL BIOPSIES;  Surgeon: Josephine Igo, DO;  [facility];  Service: Pulmonary;;   BRONCHIAL BRUSHINGS  03/22/2020   Procedure: BRONCHIAL BRUSHINGS;  Surgeon: Josephine Igo, DO;  [facility];  Service: Pulmonary;;   BRONCHIAL NEEDLE ASPIRATION BIOPSY  03/22/2020   Procedure: BRONCHIAL NEEDLE ASPIRATION BIOPSIES;  Surgeon: Josephine Igo, DO;  [facility]: [facility];  Service: Pulmonary;;   BRONCHIAL WASHINGS  03/22/2020   Procedure: BRONCHIAL WASHINGS;  Surgeon: Josephine Igo, DO;  [facility];  Service: Pulmonary;;   FIDUCIAL MARKER PLACEMENT  03/22/2020   Procedure: FIDUCIAL MARKER PLACEMENT;  Surgeon: Josephine Igo, DO;  [facility];  Service: Pulmonary;;   NO PAST SURGERIES     ROBOTIC ASSITED PARTIAL NEPHRECTOMY Left 2022   VIDEO BRONCHOSCOPY WITH ENDOBRONCHIAL NAVIGATION N/A 03/22/2020   Procedure: VIDEO BRONCHOSCOPY WITH ENDOBRONCHIAL NAVIGATION;  Surgeon: Josephine Igo, DO;  [facility];  Service:  Pulmonary;  Laterality: N/A;   VIDEO BRONCHOSCOPY WITH ENDOBRONCHIAL ULTRASOUND N/A 03/22/2020   Procedure: VIDEO BRONCHOSCOPY WITH ENDOBRONCHIAL ULTRASOUND;  Surgeon: Josephine Igo, DO;  [facility]: [facility];  Service: Pulmonary;  Laterality: N/A;    REVIEW OF SYSTEMS:  A comprehensive review of systems was negative except for: Constitutional: positive for weight loss   PHYSICAL EXAMINATION: General appearance: alert, cooperative, and no  distress Head: Normocephalic, without obvious abnormality, atraumatic Neck: no adenopathy, no JVD, supple, symmetrical, trachea midline, and thyroid not enlarged, symmetric, no tenderness/mass/nodules Lymph nodes: Cervical, supraclavicular, and axillary nodes normal. Resp: clear to auscultation bilaterally Back: symmetric, no curvature. ROM normal. No CVA tenderness. Cardio: regular rate and rhythm, S1, S2 normal, no murmur, click, rub or gallop GI: soft, non-tender; bowel sounds normal; no masses,  no organomegaly Extremities: extremities normal, atraumatic, no cyanosis or edema  ECOG PERFORMANCE STATUS: 1 - Symptomatic but completely ambulatory  Blood pressure 117/67, pulse 81, temperature 98 F (36.7 C), temperature source Oral, resp. rate 18, weight 131 lb 14.4 oz (59.8 kg), SpO2 100 %.  LABORATORY DATA: Lab Results  Component Value Date   WBC 5.3 09/17/2022   HGB 13.9 09/17/2022   HCT 40.1 09/17/2022   MCV 93.0 09/17/2022   PLT 199 09/17/2022      Chemistry      Component Value Date/Time   NA 134 (L) 09/17/2022 0742   K 4.2 09/17/2022 0742   CL 99 09/17/2022 0742   CO2 26 09/17/2022 0742   BUN 17 09/17/2022 0742   CREATININE 1.13 09/17/2022 0742   CREATININE 0.86 11/12/2011 0917      Component Value Date/Time   CALCIUM 9.8 09/17/2022 0742   ALKPHOS 92 09/17/2022 0742   AST 44 (H) 09/17/2022 0742   ALT 33 09/17/2022 0742   BILITOT 0.7 09/17/2022 0742       RADIOGRAPHIC STUDIES: No results found.  ASSESSMENT AND PLAN: This is a very pleasant 72 years old white male diagnosed with poorly differentiated carcinoma consistent with adenocarcinoma in the right upper lobe.  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  The patient also has a suspicious left renal lesion concerning for renal cell carcinoma. He is status post a course of systemic chemotherapy initially with cisplatin and etoposide but the cisplatin was  discontinued secondary to renal insufficiency and the patient continued 3 more cycles of his systemic chemotherapy with carboplatin and etoposide concurrent with radiation.   The patient is currently on observation and he is feeling fine with no concerning complaints. He had repeat CT scan of the chest performed on 09/17/2022 but unfortunately the final report is still pending. I personally and independently reviewed the scan images in comparison to the previous imaging studies and I do not see any clear evidence for disease recurrence or metastasis but I will wait for the final report for confirmation. I recommended for the patient to continue on observation with repeat CT scan of the chest in 6 months.  I will call him if the pending report showed any concerning findings. He was advised to call immediately if he has any other concerning symptoms in the interval. The patient voices understanding of current disease status and treatment options and is in agreement with the current care plan.  All questions were answered. The patient knows to call the clinic with any problems, questions or concerns. We can certainly see the patient much sooner if necessary.   Disclaimer: This  note was dictated with voice recognition software. Similar sounding words can inadvertently be transcribed and may not be corrected upon review.

## 2022-09-25 ENCOUNTER — Encounter: Payer: Self-pay | Admitting: Physician Assistant

## 2022-11-06 ENCOUNTER — Ambulatory Visit (HOSPITAL_COMMUNITY): Admission: RE | Admit: 2022-11-06 | Payer: Medicare Other | Source: Ambulatory Visit

## 2022-11-10 ENCOUNTER — Inpatient Hospital Stay: Payer: Medicare Other

## 2022-11-10 ENCOUNTER — Ambulatory Visit: Payer: Medicare Other | Admitting: Radiation Oncology

## 2022-11-11 ENCOUNTER — Ambulatory Visit (HOSPITAL_COMMUNITY)
Admission: RE | Admit: 2022-11-11 | Discharge: 2022-11-11 | Disposition: A | Discharge status: Home / Self Care | Payer: Medicare Other | Source: Ambulatory Visit | Attending: Radiation Oncology | Admitting: Radiation Oncology

## 2022-11-11 DIAGNOSIS — C3490 Malignant neoplasm of unspecified part of unspecified bronchus or lung: Secondary | ICD-10-CM | POA: Insufficient documentation

## 2022-11-11 MED ORDER — GADOBUTROL 1 MMOL/ML IV SOLN
6.0000 mL | Freq: Once | INTRAVENOUS | Status: AC | PRN
  Administered 2022-11-11: 6 mL via INTRAVENOUS

## 2022-11-17 ENCOUNTER — Encounter: Payer: Self-pay | Admitting: Radiation Oncology

## 2022-11-17 ENCOUNTER — Ambulatory Visit
Admission: RE | Admit: 2022-11-17 | Discharge: 2022-11-17 | Disposition: A | Discharge status: Home / Self Care | Payer: Medicare Other | Source: Ambulatory Visit | Attending: Radiation Oncology | Admitting: Radiation Oncology

## 2022-11-17 ENCOUNTER — Inpatient Hospital Stay: Payer: Medicare Other

## 2022-11-17 DIAGNOSIS — C3491 Malignant neoplasm of unspecified part of right bronchus or lung: Secondary | ICD-10-CM

## 2022-11-17 DIAGNOSIS — C642 Malignant neoplasm of left kidney, except renal pelvis: Secondary | ICD-10-CM

## 2022-11-17 NOTE — Progress Notes (Signed)
Radiation Oncology         [phone] ________________________________   Outpatient Follow Up - Conducted via telephone at patient request.  I spoke with the patient to conduct this consult visit via telephone. The patient was notified in advance and was offered an in person or telemedicine meeting to allow for face to face communication but instead preferred to proceed with a telephone visit.    Name: Ian Hart        MRN: [id number]  Date of Service: 11/17/2022 DOB: 1950/06/08  CC:O'Sullivan, Efraim Kaufmann, NP    REFERRING PHYSICIAN: [name], NP   DIAGNOSIS: The primary encounter diagnosis was Small cell carcinoma of right lung (HCC). A diagnosis of Renal cell carcinoma of left kidney [facility]) was also pertinent to this visit.   HISTORY OF PRESENT ILLNESS: Ian Hart is a 72 y.o. male with a diagnosis of lung cancer.  He was found to have a spiculated 13.6 mm nodule in the right upper lobe on lung cancer screening scan, and he subsequently underwent a PET scan on 03/01/2020 which revealed hypermetabolism within the right upper lobe nodule as well as an isolated hypermetabolic subcarinal lymph nodes suspicious for nodal disease.  No other extrathoracic hypermetabolic metastases were identified, a left renal lesion which was indeterminate but hypermetabolic was also seen and possibly suspicious for renal cell carcinoma.  He underwent a CT super D scan on 03/22/2020 and subsequent bronchoscopy.  Interestingly bronchoscopy confirmed adenocarcinoma with in the lung nodule and small cell carcinoma within the 7 station lymph node.  He proceeded with chemoRT between 04/17/20 and 06/01/20. He also proceeded with prophylactic cranial irradiation to reduce risks of brain disease. 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  He has been without disease since his treatment, and has been followed in surveillance with Dr. Arbutus Ped. During his last visit, we had referred him to ENT for mastoid effusion, but ultimately his hearing improved and he did not feel that he needed the appointment. He had repeat MRI on 11/11/22 that showed punctate foci of enhancement in the left frontal lobe and left cerebellum. He also had small bilateral mastoid effusions. He's contacted today by phone to review these results.   On review of systems, the patient states he is doing well. He is not experiencing any headaches or visual changes, difficulties with movement or thinking. He does have difficulty with his hearing which is ongoing. He plans to go for fitting of hearing aids, but denies any recent progressive loss in hearing. No other complaints are verbalized.   PREVIOUS RADIATION THERAPY:   08/20/2020 through 08/31/2020 Site Technique Total Dose (Gy) Dose per Fx (Gy) Completed Fx Beam Energies  Brain: Brain Complex 25/25 2.5 10/10 6X     Radiation Treatment Dates: 04/17/2020 through 06/01/2020 Site Technique Total Dose (Gy) Dose per Fx (Gy) Completed Fx Beam Energies  Lung, Right: Lung_Rt 3D 60/60 2 30/30 6X, 10X  Lung, Right: Lung_Rt_Bst 3D 6/6 2 3/3 6X, 10X     PAST MEDICAL HISTORY:  Past Medical History:  Diagnosis Date   History of chicken pox    History of shingles 04/2010   Lung cancer (HCC) 02/2020       PAST SURGICAL HISTORY: Past Surgical History:  Procedure Laterality Date   BRONCHIAL BIOPSY  03/22/2020   Procedure:  BRONCHIAL BIOPSIES;  Surgeon: Josephine Igo, DO;  [facility];  Service: Pulmonary;;   BRONCHIAL BRUSHINGS  03/22/2020   Procedure: BRONCHIAL BRUSHINGS;  Surgeon: Josephine Igo, DO;  [facility];  Service: Pulmonary;;   BRONCHIAL NEEDLE ASPIRATION BIOPSY  03/22/2020   Procedure: BRONCHIAL NEEDLE ASPIRATION BIOPSIES;  Surgeon: Josephine Igo, DO;  [facility];  Service: Pulmonary;;   BRONCHIAL WASHINGS  03/22/2020   Procedure: BRONCHIAL WASHINGS;  Surgeon: Josephine Igo, DO;  [facility];  Service: Pulmonary;;   FIDUCIAL MARKER PLACEMENT  03/22/2020   Procedure: FIDUCIAL MARKER PLACEMENT;  Surgeon: Josephine Igo, DO;  [facility];  Service: Pulmonary;;   NO PAST SURGERIES     ROBOTIC ASSITED PARTIAL NEPHRECTOMY Left 2022   VIDEO BRONCHOSCOPY WITH ENDOBRONCHIAL NAVIGATION N/A 03/22/2020   Procedure: VIDEO BRONCHOSCOPY WITH ENDOBRONCHIAL NAVIGATION;  Surgeon: Josephine Igo, DO;  [facility];  Service: Pulmonary;  Laterality: N/A;   VIDEO BRONCHOSCOPY WITH ENDOBRONCHIAL ULTRASOUND N/A 03/22/2020   Procedure: VIDEO BRONCHOSCOPY WITH ENDOBRONCHIAL ULTRASOUND;  Surgeon: Josephine Igo, DO;  [facility];  Service: Pulmonary;  Laterality: N/A;     FAMILY HISTORY:  Family History  Problem Relation Age of Onset   Sudden death Mother    Rheumatic fever Mother    COPD Brother    Liver disease Neg Hx    Colon cancer Neg Hx    Esophageal cancer Neg Hx      SOCIAL HISTORY:  reports that he has been smoking cigarettes. He has a 100 pack-year smoking history. He has never used smokeless tobacco. He reports current alcohol use of about 5.0 standard drinks of alcohol per week. He reports that he does not use drugs. The patient is married and lives in [place]. 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    ALLERGIES: Acyclovir and related   MEDICATIONS:  Current Outpatient Medications  Medication Sig Dispense Refill   Multiple Vitamin (MULTIVITAMIN) tablet Take 1 tablet by mouth daily.     tamsulosin (FLOMAX) 0.4 MG CAPS capsule Take 0.4 mg by mouth daily.     Varenicline Tartrate, Starter, (CHANTIX STARTING MONTH PAK) 0.5 MG X 11 & 1 MG X 42 TBPK Take one 0.5 mg tablet by mouth once daily for 3 days, then increase to one 0.5 mg tablet twice daily for 4 days, then  increase to one 1 mg tablet twice daily. 53 each 0   Zoster Vaccine Adjuvanted [facility]) injection 0.43ml IM now and again in 2-6 months 0.5 mL 1   No current facility-administered medications for this encounter.     PHYSICAL EXAM:  Unable to assess due to encounter type.  ECOG = 1  0 - Asymptomatic (Fully active, able to carry on all predisease activities without restriction)  1 - Symptomatic but completely ambulatory (Restricted in physically strenuous activity but ambulatory and able to carry out work of a light or sedentary nature. For example, light housework, office work)  2 - Symptomatic, <50% in bed during the day (Ambulatory and capable of all self care but unable to carry out any work activities. Up and about more than 50% of waking hours)  3 - Symptomatic, >50% in bed, but not bedbound (Capable of only limited self-care, confined to bed or chair 50% or more of waking hours)  4 - Bedbound (Completely disabled. Cannot carry on any self-care. Totally confined to bed or chair)  5 - Death  Oken MM, Creech RH, Tormey DC, et al. [phone]). "Toxicity and response criteria of the [facility]". Am. Evlyn Clines. Oncol. 5 (6): 649-55    LABORATORY DATA:  Lab Results  Component Value Date   WBC 5.3 09/17/2022   HGB 13.9 09/17/2022   HCT 40.1 09/17/2022   MCV 93.0 09/17/2022   PLT 199 09/17/2022   Lab Results  Component Value Date   NA 134 (L) 09/17/2022   K 4.2 09/17/2022   CL 99 09/17/2022   CO2 26 09/17/2022   Lab Results  Component Value Date   ALT 33 09/17/2022   AST 44 (H) 09/17/2022   ALKPHOS 92 09/17/2022   BILITOT 0.7 09/17/2022      RADIOGRAPHY: MR Brain W Wo Contrast  Result Date: 11/14/2022 CLINICAL DATA:  Provided history: Small cell lung cancer, monitor. EXAM: MRI HEAD WITHOUT AND WITH CONTRAST TECHNIQUE: Multiplanar, multiecho pulse sequences of the brain and surrounding structures were obtained without and with intravenous contrast.  CONTRAST:  6mL GADAVIST GADOBUTROL 1 MMOL/ML IV SOLN COMPARISON:  Brain MRI 05/07/2022. FINDINGS: Brain: Mild-to-moderate cerebral and cerebellar atrophy. Punctate focus of enhancement within the left frontal lobe precentral gyrus (series 1100, image 238). Punctate focus of enhancement within the left cerebellar hemisphere (series 1100, image114). Punctate focus of enhancement within the left cerebellar hemisphere Multifocal T2 FLAIR hyperintense signal abnormality within the cerebral white matter, nonspecific but compatible with mild chronic small vessel ischemic disease. These findings are similar to the prior brain MRI of 05/07/2022. Small focus of susceptibility-weighted signal loss along the frontal horn of the left lateral ventricle, chronic and unchanged. There is no acute infarct. No extra-axial fluid collection. No midline shift. Vascular: Maintained flow voids within the proximal large arterial vessels. Skull and upper cervical spine: No focal suspicious marrow lesion. Sinuses/Orbits: No mass or acute finding within the imaged orbits. No significant paranasal sinus disease. Other: Small-volume fluid within the bilateral mastoid air cells. These results will be called to the ordering clinician or representative by the Radiologist Assistant, and communication documented in the PACS or [employer]. IMPRESSION: Punctate foci of enhancement within the posterior left frontal lobe and left cerebellar hemisphere (two total). These foci may reflect vascular enhancement or small metastases. A short-interval follow-up brain MRI (with and without contrast) is recommended in 8 weeks. Electronically Signed   By: Jackey Loge D.O.   On: 11/14/2022 10:00        IMPRESSION/PLAN: 1. Stage IIIA, [id number], mixed histology NSCLC and small cell carcinoma of the RUL and subcarinal nodal station. The patient is doing well overall systemically. We discussed the punctate findings on MRI in the frontal lobe and cerebellum.  It is likely these could be vascular in nature, but we would plan to repeat in 2 months as recommended by neuro radiology. If stable or no longer visible, we could revisit getting back to 6 month intervals. The patient and his wife are in agreement.  2. 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 He continues to follow up with [facility] in surveillance. We will follow this expectantly. 3. Bilateral mastoid effusions with hearing loss. His symptoms improved after medrol dosepak in March 2024, but are currently stable and small by MRI. He will be seeing audiology and having hearing aids, but no progressive symptoms are verbalized, and he prefers to see how this goes rather than revisit with ENT.   This encounter was conducted via telephone.  The patient has provided two factor identification and has given verbal consent for this type of encounter and has been advised to only accept a meeting of this type in a secure network environment. The time spent during this encounter was 35 minutes including preparation, discussion, and coordination of the patient's care. The attendants for this meeting include Ronny Bacon  and ADHAM BORREGO and his wife Geraldo Queiroz. During the encounter, Ronny Bacon were located at [facility] Radiation Oncology Department.  JAKIEM SCOGGINS was located at home with his wife Tynell Hollinghead.     The above documentation reflects my direct findings during this shared patient visit. Please see the separate note by Dr. Mitzi Hansen on this date for the remainder of the patient's plan of care.    Osker Mason, PAC

## 2022-11-17 NOTE — Progress Notes (Signed)
Telephone nursing appointment for patient to review most recent telephone. I verified patient's identity x2 and began nursing interview.   Patient reports doing well. No issues conveyed at this time.   Meaningful use complete.   Patient aware of their 2:30pm-11/17/2022 telephone appointment w/ Laurence Aly PA-C. I left my extension [phone] in case patient needs anything. Patient verbalized understanding. This concludes the nursing interview.   Patient contact [phone]     Ruel Favors, LPN

## 2022-11-18 ENCOUNTER — Other Ambulatory Visit: Payer: Self-pay | Admitting: Radiation Therapy

## 2022-11-18 ENCOUNTER — Encounter: Payer: Self-pay | Admitting: Radiation Oncology

## 2022-11-18 DIAGNOSIS — C3490 Malignant neoplasm of unspecified part of unspecified bronchus or lung: Secondary | ICD-10-CM

## 2023-01-01 ENCOUNTER — Telehealth: Payer: Self-pay | Admitting: Radiation Therapy

## 2023-01-01 NOTE — Telephone Encounter (Signed)
Called to inform pt of upcoming brain MRI and telephone follow-up with Jill Side. He was already aware of these visits from MyChart, but was thankful for the call.   Jalene Mullet R.T.(R)(T) Radiation Special Procedures Navigator

## 2023-01-12 ENCOUNTER — Ambulatory Visit (HOSPITAL_COMMUNITY)
Admission: RE | Admit: 2023-01-12 | Discharge: 2023-01-12 | Disposition: A | Discharge status: Home / Self Care | Payer: Medicare Other | Source: Ambulatory Visit | Attending: Radiation Oncology | Admitting: Radiation Oncology

## 2023-01-12 DIAGNOSIS — C3490 Malignant neoplasm of unspecified part of unspecified bronchus or lung: Secondary | ICD-10-CM | POA: Diagnosis present

## 2023-01-12 MED ORDER — GADOBUTROL 1 MMOL/ML IV SOLN
6.0000 mL | Freq: Once | INTRAVENOUS | Status: AC | PRN
  Administered 2023-01-12: 6 mL via INTRAVENOUS

## 2023-01-19 ENCOUNTER — Inpatient Hospital Stay: Payer: Medicare Other

## 2023-01-19 ENCOUNTER — Other Ambulatory Visit: Payer: Self-pay | Admitting: Radiation Therapy

## 2023-01-19 ENCOUNTER — Ambulatory Visit
Admission: RE | Admit: 2023-01-19 | Discharge: 2023-01-19 | Disposition: A | Discharge status: Home / Self Care | Payer: Medicare Other | Source: Ambulatory Visit | Attending: Radiation Oncology | Admitting: Radiation Oncology

## 2023-01-19 DIAGNOSIS — C3411 Malignant neoplasm of upper lobe, right bronchus or lung: Secondary | ICD-10-CM

## 2023-01-19 DIAGNOSIS — C7931 Secondary malignant neoplasm of brain: Secondary | ICD-10-CM

## 2023-01-19 NOTE — Progress Notes (Signed)
Has armband been applied?  {yes no:314532}  Does patient have an allergy to IV contrast dye?: {yes no:314532}   Has patient ever received premedication for IV contrast dye?: {yes no:314532}   Does patient take metformin?: {yes no:314532}  If patient does take metformin when was the last dose: {Time; dates multiple:15870}  Date of lab work: 01/21/2023 BUN: *** CR: *** eGfr: ***  IV site: {iv locations:314275}  Has IV site been added to flowsheet?  {yes no:314532}  There were no vitals taken for this visit.

## 2023-01-19 NOTE — Progress Notes (Signed)
Telephone nursing appointment for review of most recent scan results. I verified patient's identity x2 and began nursing interview.   Patient reports doing well. Patient denies any related lung/ brain issues/ discomfort at this time.    Meaningful use complete.   Patient aware of their 3pm telephone appointment w/ Laurence Aly PA-C. I left my extension [phone] in case patient needs anything. Patient verbalized understanding. This concludes the nursing interview.   Patient contact [phone]     Ruel Favors, LPN

## 2023-01-20 ENCOUNTER — Other Ambulatory Visit: Payer: Self-pay | Admitting: Radiation Therapy

## 2023-01-20 ENCOUNTER — Telehealth: Payer: Self-pay | Admitting: Radiation Therapy

## 2023-01-20 DIAGNOSIS — C7931 Secondary malignant neoplasm of brain: Secondary | ICD-10-CM | POA: Insufficient documentation

## 2023-01-20 NOTE — Progress Notes (Signed)
Radiation Oncology         ([phone]) [phone] ________________________________   Outpatient Follow Up - Conducted via telephone at patient request.  I spoke with the patient to conduct this consult visit via telephone. The patient was notified in advance and was offered an in person or telemedicine meeting to allow for face to face communication but instead preferred to proceed with a telephone visit.    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   HISTORY OF PRESENT ILLNESS: Ian Hart is a 72 y.o. 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 This was initially found on lung cancer screening scan, and he subsequently underwent a PET scan on 03/01/2020 which revealed hypermetabolism within the right upper lobe nodule as well as an isolated hypermetabolic subcarinal lymph nodes suspicious for nodal disease.  No other extrathoracic hypermetabolic metastases were identified, a left renal lesion which was indeterminate but hypermetabolic was also seen and possibly suspicious for renal cell carcinoma.  He underwent a CT super D scan on 03/22/2020 and subsequent bronchoscopy.  Interestingly bronchoscopy confirmed adenocarcinoma with in the lung nodule and small cell carcinoma within the 7 station lymph node.  He proceeded with chemoRT between 04/17/20 and 06/01/20. He also proceeded with prophylactic cranial irradiation to reduce risks of brain disease. 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  He was offered evaluation to ENT for mastoid effusion, but ultimately his hearing improved and he did not  feel that he needed the appointment. A repeat MRI on 11/11/22 that showed punctate foci of enhancement in the left frontal lobe and left cerebellum but it was felt these changes were vascular in nature and to closely follow with repeat MRI for any interval change. This repeat MRI on 01/12/23 showed an increase in the two lesions in the left frontal (2.5 mm) and left cerebellum (1.5 mm), as well as a new left corona radiata measuring 2.5 mm, medial left parietal lobe measuring 2.5 mm, and a right pontine lesion measuring 1.5 mm.  He's contacted today by phone to review these results and to review the recommendation from brain oncology conference to proceed with single fraction salvage stereotactic radiosurgery [facility]).   On review of systems, the patient states he is doing pretty well. He is not complaining of headaches, visual or auditory changes at this time. No shortness of breath or chest pain, or other symptoms of pain are noted. No other complaints are verbalized.   PREVIOUS RADIATION THERAPY:   08/20/2020 through 08/31/2020 Site Technique Total Dose (Gy) Dose per Fx (Gy) Completed Fx Beam Energies  Brain: Brain Complex 25/25 2.5 10/10 6X     Radiation Treatment Dates: 04/17/2020 through 06/01/2020 Site Technique Total Dose (Gy) Dose per Fx (Gy) Completed Fx Beam Energies  Lung, Right: Lung_Rt 3D 60/60 2 30/30 6X, 10X  Lung, Right: Lung_Rt_Bst 3D 6/6 2 3/3 6X, 10X     PAST MEDICAL HISTORY:  Past Medical History:  Diagnosis Date   History of chicken pox    History of shingles 04/2010   Lung cancer (HCC) 02/2020  PAST SURGICAL HISTORY: Past Surgical History:  Procedure Laterality Date   BRONCHIAL BIOPSY  03/22/2020   Procedure: BRONCHIAL BIOPSIES;  Surgeon: Josephine Igo, DO;  Location: [facility];  Service: Pulmonary;;   BRONCHIAL BRUSHINGS  03/22/2020   Procedure: BRONCHIAL BRUSHINGS;  Surgeon: Josephine Igo, DO;  Location: [facility];  Service: Pulmonary;;   BRONCHIAL  NEEDLE ASPIRATION BIOPSY  03/22/2020   Procedure: BRONCHIAL NEEDLE ASPIRATION BIOPSIES;  Surgeon: Josephine Igo, DO;  Location: [facility];  Service: Pulmonary;;   BRONCHIAL WASHINGS  03/22/2020   Procedure: BRONCHIAL WASHINGS;  Surgeon: Josephine Igo, DO;  Location: [facility];  Service: Pulmonary;;   FIDUCIAL MARKER PLACEMENT  03/22/2020   Procedure: FIDUCIAL MARKER PLACEMENT;  Surgeon: Josephine Igo, DO;  Location: [facility];  Service: Pulmonary;;   NO PAST SURGERIES     ROBOTIC ASSITED PARTIAL NEPHRECTOMY Left 2022   VIDEO BRONCHOSCOPY WITH ENDOBRONCHIAL NAVIGATION N/A 03/22/2020   Procedure: VIDEO BRONCHOSCOPY WITH ENDOBRONCHIAL NAVIGATION;  Surgeon: Josephine Igo, DO;  Location: [facility];  Service: Pulmonary;  Laterality: N/A;   VIDEO BRONCHOSCOPY WITH ENDOBRONCHIAL ULTRASOUND N/A 03/22/2020   Procedure: VIDEO BRONCHOSCOPY WITH ENDOBRONCHIAL ULTRASOUND;  Surgeon: Josephine Igo, DO;  Location: [facility];  Service: Pulmonary;  Laterality: N/A;     FAMILY HISTORY:  Family History  Problem Relation Age of Onset   Sudden death Mother    Rheumatic fever Mother    COPD Brother    Liver disease Neg Hx    Colon cancer Neg Hx    Esophageal cancer Neg Hx      SOCIAL HISTORY:  reports that he has been smoking cigarettes. He has a 100 pack-year smoking history. He has never used smokeless tobacco. He reports current alcohol use of about 5.0 standard drinks of alcohol per week. He reports that he does not use drugs. The patient is married and lives in [place]. 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    ALLERGIES: Acyclovir and related   MEDICATIONS:  Current Outpatient Medications  Medication Sig Dispense Refill   Multiple Vitamin (MULTIVITAMIN) tablet Take 1 tablet by mouth daily.     tamsulosin (FLOMAX) 0.4 MG CAPS capsule Take 0.4 mg by mouth daily.     Varenicline Tartrate, Starter, (CHANTIX STARTING MONTH PAK) 0.5 MG X 11 & 1  MG X 42 TBPK Take one 0.5 mg tablet by mouth once daily for 3 days, then increase to one 0.5 mg tablet twice daily for 4 days, then increase to one 1 mg tablet twice daily. 53 each 0   Zoster Vaccine Adjuvanted [facility]) injection 0.48ml IM now and again in 2-6 months 0.5 mL 1   No current facility-administered medications for this encounter.     PHYSICAL EXAM:  Unable to assess due to encounter type.  ECOG = 0  0 - Asymptomatic (Fully active, able to carry on all predisease activities without restriction)  1 - Symptomatic but completely ambulatory (Restricted in physically strenuous activity but ambulatory and able to carry out work of a light or sedentary nature. For example, light housework, office work)  2 - Symptomatic, <50% in bed during the day (Ambulatory and capable of all self care but unable to carry out any work activities. Up and about more than 50% of waking hours)  3 - Symptomatic, >50% in bed, but not bedbound (Capable of only limited self-care, confined to bed or chair 50% or more of waking hours)  4 - Bedbound (  Completely disabled. Cannot carry on any self-care. Totally confined to bed or chair)  5 - Death   Santiago Glad MM, Creech RH, Tormey DC, et al. [phone]). "Toxicity and response criteria of the [facility]". Am. Evlyn Clines. Oncol. 5 (6): 649-55    LABORATORY DATA:  Lab Results  Component Value Date   WBC 5.3 09/17/2022   HGB 13.9 09/17/2022   HCT 40.1 09/17/2022   MCV 93.0 09/17/2022   PLT 199 09/17/2022   Lab Results  Component Value Date   NA 134 (L) 09/17/2022   K 4.2 09/17/2022   CL 99 09/17/2022   CO2 26 09/17/2022   Lab Results  Component Value Date   ALT 33 09/17/2022   AST 44 (H) 09/17/2022   ALKPHOS 92 09/17/2022   BILITOT 0.7 09/17/2022      RADIOGRAPHY: MR Brain W Wo Contrast  Result Date: 01/16/2023 CLINICAL DATA:  Small cell lung cancer. Previous abnormal MRI of the brain. EXAM: MRI HEAD WITHOUT AND WITH CONTRAST  TECHNIQUE: Multiplanar, multiecho pulse sequences of the brain and surrounding structures were obtained without and with intravenous contrast. CONTRAST:  6mL GADAVIST GADOBUTROL 1 MMOL/ML IV SOLN COMPARISON:  MR head 11/11/2022 FINDINGS: Brain: The previously noted enhancing lesions of the posterior left frontal lobe in the left cerebellar hemisphere have increased in size. All lesions are measured on the postcontrast axial reformatted series, series 1100 The posterior left frontal lobe lesion now measures 2.5 mm on image 240. Focal restricted diffusion is associated with this lesion. No associated hemorrhage is present. The left cerebellar lesion measures 1.5 mm on image 114. A new right pontine lesion measures 2 mm on image 149. A 2.5 mm lesion is present in the medial left parietal lobe on image 203. A new lesion in the left corona radiata measures 2.5 mm on image 219. Mild periventricular T2 hyperintensities bilaterally are stable. No acute infarct or hemorrhage is present. Mild generalized atrophy is stable. The ventricles are proportionate to the degree of atrophy. No significant extraaxial fluid collection is present. The brainstem and cerebellum are within normal limits. The internal auditory canals are within normal limits. Midline structures are within normal limits. Vascular: Flow is present in the major intracranial arteries. Skull and upper cervical spine: The craniocervical junction is normal. Upper cervical spine is within normal limits. Marrow signal is unremarkable. Sinuses/Orbits: Bilateral mastoid effusions are present. No obstructing nasopharyngeal lesion is present. Paranasal sinuses are otherwise clear. The globes and orbits are within normal limits. IMPRESSION: 1. Interval increase in size of enhancing lesions of the posterior left frontal lobe and left cerebellar hemisphere. 2. New lesions are present in the right pons, medial left parietal lobe, and left corona radiata. A total of 5  enhancing lesions are now present, consistent with metastatic disease. 3. Mild generalized atrophy and white matter disease is stable. This likely reflects the sequela of chronic microvascular ischemia. 4. Bilateral mastoid effusions. No obstructing nasopharyngeal lesion is present. Electronically Signed   By: Marin Roberts M.D.   On: 01/16/2023 14:42        IMPRESSION/PLAN: 1. 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  We discussed the recent findings from his MRI scan and the discussion from multidisciplinary brain and spine oncology).  Dr. Mitzi Hansen recommends proceeding with stereotactic radiosurgery in the single fraction to the 5 lesions seen by MRI scan.  We discussed the risks, benefits, short and long-term effects of radiotherapy  and the patient is interested in proceeding.  I will follow-up with Dr. Arbutus Ped as well given the concern for recurrent disease.  The patient will be contacted by her brain and spine oncology navigator to help coordinate upcoming simulation, and subsequent treatment.  He is aware of the need to meet with neurosurgery, and for IV contrast at the time of simulation.  He is hoping to have therapy early next week given that he has a planned beach trip this weekend that he would like to attend. 2. 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 He continues to follow up with [facility] Urology in surveillance. We will follow this expectantly. 3. Bilateral mastoid effusions with hearing loss. His symptoms improved after medrol dosepak in March 2024, and continues to have stable radiographic findings. He is aware of the ability to re-evaluate this with ENT if he desires.  This encounter was conducted via telephone.  The patient has provided two factor identification and has given verbal consent for this type of encounter and has been advised to only accept a  meeting of this type in a secure network environment. The time spent during this encounter was 45 minutes including preparation, discussion, and coordination of the patient's care. The attendants for this meeting include Ian Hart  and Ian Hart. During the encounter, Ian Hart was located remotely. Ian Hart was located at home.    Osker Mason, PAC

## 2023-01-20 NOTE — Telephone Encounter (Signed)
I spoke with the patient about his upcoming radiation appointments. He has a consult with Dr. Maurice Small on Thursday 10/10 @ 2:30 and his treatment is on Tuesday 10/15. Mr. Vidales is aware of these appointments and was thankful for the call.   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 He wanted to know if this was something that he could also receive. I shared that Mr. Montez Morita had metastatic melanoma and his primary cancer is lung, different pathologies require different targeted therapies for a successful response. Since I am not familiar with the immunotherapies or targeted chemotherapies, I will reach out to [facility] in medical oncology for clarification and get back with him.    Jalene Mullet R.T.(R)(T) Radiation Special Procedures Navigator

## 2023-01-21 ENCOUNTER — Ambulatory Visit
Admission: RE | Admit: 2023-01-21 | Discharge: 2023-01-21 | Disposition: A | Discharge status: Home / Self Care | Payer: Medicare Other | Source: Ambulatory Visit | Attending: Radiation Oncology | Admitting: Radiation Oncology

## 2023-01-21 VITALS — BP 116/62 | HR 96 | Temp 97.8°F | Resp 20 | Ht 73.0 in | Wt 129.6 lb

## 2023-01-21 DIAGNOSIS — Z510 Encounter for antineoplastic radiation therapy: Secondary | ICD-10-CM | POA: Diagnosis present

## 2023-01-21 DIAGNOSIS — C3411 Malignant neoplasm of upper lobe, right bronchus or lung: Secondary | ICD-10-CM | POA: Insufficient documentation

## 2023-01-21 DIAGNOSIS — C7931 Secondary malignant neoplasm of brain: Secondary | ICD-10-CM

## 2023-01-21 DIAGNOSIS — F17210 Nicotine dependence, cigarettes, uncomplicated: Secondary | ICD-10-CM | POA: Insufficient documentation

## 2023-01-21 LAB — BUN & CREATININE (CHCC)
BUN: 13 mg/dL (ref 8–23)
Creatinine: 1.09 mg/dL (ref 0.61–1.24)
GFR, Estimated: >60 mL/min (ref >60)

## 2023-01-21 MED ORDER — SODIUM CHLORIDE 0.9% FLUSH
10.0000 mL | Freq: Once | INTRAVENOUS | Status: AC
  Administered 2023-01-21: 10 mL via INTRAVENOUS

## 2023-01-23 ENCOUNTER — Telehealth: Payer: Self-pay

## 2023-01-23 DIAGNOSIS — Z510 Encounter for antineoplastic radiation therapy: Secondary | ICD-10-CM | POA: Diagnosis not present

## 2023-01-23 NOTE — Telephone Encounter (Signed)
Called to inform patient of radiation oncology appointment changes. Patient expressed understanding.

## 2023-01-24 DIAGNOSIS — Z510 Encounter for antineoplastic radiation therapy: Secondary | ICD-10-CM | POA: Diagnosis not present

## 2023-01-26 ENCOUNTER — Encounter: Payer: Self-pay | Admitting: Radiation Oncology

## 2023-01-26 ENCOUNTER — Telehealth: Payer: Self-pay | Admitting: *Deleted

## 2023-01-26 NOTE — Telephone Encounter (Signed)
Spoke with the patient regarding questions he had about his upcoming treatments.  I let him know that his treatment has been changed to 2 days versus 1 based on planning and execution of the treatment.  Patient verbalized understanding of this information and was appreciative of the return call.  Lind Covert RN, BSN

## 2023-01-27 ENCOUNTER — Other Ambulatory Visit: Payer: Self-pay

## 2023-01-27 ENCOUNTER — Ambulatory Visit
Admission: RE | Admit: 2023-01-27 | Discharge: 2023-01-27 | Disposition: A | Discharge status: Home / Self Care | Payer: Medicare Other | Source: Ambulatory Visit | Attending: Radiation Oncology | Admitting: Radiation Oncology

## 2023-01-27 DIAGNOSIS — Z510 Encounter for antineoplastic radiation therapy: Secondary | ICD-10-CM | POA: Diagnosis not present

## 2023-01-27 LAB — RAD ONC ARIA SESSION SUMMARY
Course Elapsed Days: 0
Plan Fractions Treated to Date: 1
Plan Prescribed Dose Per Fraction: 20 Gy
Plan Total Fractions Prescribed: 1
Plan Total Prescribed Dose: 20 Gy
Reference Point Dosage Given to Date: 20 Gy
Reference Point Session Dosage Given: 20 Gy
Session Number: 1

## 2023-01-27 NOTE — Progress Notes (Signed)
Ian Hart rested with [name] for 30 minutes following his SRS treatment.  Patient denies headache, dizziness, nausea, diplopia or ringing in the ears. Denies fatigue. Patient without complaints. Understands to avoid strenuous activity for the next 24 hours and call [phone] with needs. He is aware of next appointment in our office.  BP 132/73 (BP Location: Left Arm, Patient Position: Sitting, Cuff Size: Normal)   Pulse 81   Temp 97.9 F (36.6 C)   Resp 20   Ht 6\' 1"  (1.854 m)   SpO2 100%   BMI 17.10 kg/m    Lind Covert RN, BSN

## 2023-01-27 NOTE — Progress Notes (Signed)
Name: Ian Hart  MRN: [id number]  Date: 01/27/2023   DOB: 1950/07/13  Stereotactic Radiosurgery Operative Note  PRE-OPERATIVE DIAGNOSIS:  Multiple Brain Metastases  POST-OPERATIVE DIAGNOSIS:  Multiple Brain Metastases  PROCEDURE:  Stereotactic Radiosurgery  SURGEON:  Jadene Pierini, MD  NARRATIVE: The patient underwent a radiation treatment planning session in the radiation oncology simulation suite under the care of the radiation oncology physician and physicist.  I participated closely in the radiation treatment planning afterwards. The patient underwent planning CT which was fused to 3T high resolution MRI with 1 mm axial slices.  These images were fused on the planning system.  We contoured the gross target volumes and subsequently expanded this to yield the Planning Target Volume. I actively participated in the planning process.  I helped to define and review the target contours and also the contours of the optic pathway, eyes, brainstem and selected nearby organs at risk.  All the dose constraints for critical structures were reviewed and compared to AAPM Task Group 101.  The prescription dose conformity was reviewed.  I approved the plan electronically.    Accordingly, [name] was brought to the TrueBeam stereotactic radiation treatment linac and placed in the custom immobilization mask.  The patient was aligned according to the IR fiducial markers with BrainLab Exactrac, then orthogonal x-rays were used in ExacTrac with the 6DOF robotic table and the shifts were made to align the patient  [name] received stereotactic radiosurgery uneventfully.    Lesions treated:  5   Complex lesions treated:  1 (>3.5 cm, <93mm of optic path, or within the brainstem)   The detailed description of the procedure is recorded in the radiation oncology procedure note.  I was present for the duration of the procedure.  DISPOSITION:  Following delivery, the patient was transported  to nursing in stable condition and monitored for possible acute effects to be discharged to home in stable condition with follow-up in one month.  Jadene Pierini, MD 01/27/2023 2:07 PM

## 2023-01-30 ENCOUNTER — Inpatient Hospital Stay: Payer: Medicare Other | Attending: Radiation Oncology | Admitting: Licensed Clinical Social Worker

## 2023-01-30 ENCOUNTER — Other Ambulatory Visit: Payer: Self-pay

## 2023-01-30 ENCOUNTER — Ambulatory Visit
Admission: RE | Admit: 2023-01-30 | Discharge: 2023-01-30 | Disposition: A | Discharge status: Home / Self Care | Payer: Medicare Other | Source: Ambulatory Visit | Attending: Radiation Oncology | Admitting: Radiation Oncology

## 2023-01-30 DIAGNOSIS — Z510 Encounter for antineoplastic radiation therapy: Secondary | ICD-10-CM | POA: Diagnosis not present

## 2023-01-30 DIAGNOSIS — C3490 Malignant neoplasm of unspecified part of unspecified bronchus or lung: Secondary | ICD-10-CM

## 2023-01-30 LAB — RAD ONC ARIA SESSION SUMMARY
Course Elapsed Days: 3
Plan Fractions Treated to Date: 1
Plan Prescribed Dose Per Fraction: 20 Gy
Plan Total Fractions Prescribed: 1
Plan Total Prescribed Dose: 20 Gy
Reference Point Dosage Given to Date: 20 Gy
Reference Point Session Dosage Given: 1.97 Gy
Session Number: 2

## 2023-01-30 NOTE — Progress Notes (Signed)
Ian Hart rested with [name] for 30 minutes following his SRS treatment.  Patient denies headache, dizziness, nausea, diplopia or ringing in the ears. Denies fatigue. Patient without complaints. Understands to avoid strenuous activity for the next 24 hours and call [phone] with needs. He is aware of next appointment in our office.  BP 129/70 (BP Location: Left Arm, Patient Position: Sitting, Cuff Size: Normal)   Pulse 78   Temp 97.9 F (36.6 C)   Resp 20   Ht 6\' 1"  (1.854 m)   SpO2 100%   BMI 17.10 kg/m    Lind Covert RN, BSN

## 2023-02-02 NOTE — Radiation Completion Notes (Signed)
Radiation Oncology         [phone]) [phone] ________________________________  Name: Ian Hart MRN: [id number]  Date of Service: 01/30/2023  DOB: [birth year]  End of Treatment Note    Diagnosis: Recurrent Metastatic Stage IIIA, [id number], mixed histology NSCLC and small cell carcinoma of the RUL and subcarinal nodal station.   Intent: Palliative     ==========DELIVERED PLANS==========  First Treatment Date: 2023-01-27 - Last Treatment Date: 2023-01-30   Plan Name: Brain_1_dca Site: Brain Technique: SBRT/SRT-3D Mode: Photon Dose Per Fraction: 20 Gy Prescribed Dose (Delivered / Prescribed): 20 Gy / 20 Gy Prescribed Fxs (Delivered / Prescribed): 1 / 1   Plan Name: Brain_2_dca Site: Brain Technique: SBRT/SRT-3D Mode: Photon Dose Per Fraction: 20 Gy Prescribed Dose (Delivered / Prescribed): 20 Gy / 20 Gy Prescribed Fxs (Delivered / Prescribed): 1 / 1   Plan Name: Brain_3_dca Site: Brain Technique: SBRT/SRT-3D Mode: Photon Dose Per Fraction: 20 Gy Prescribed Dose (Delivered / Prescribed): 20 Gy / 20 Gy Prescribed Fxs (Delivered / Prescribed): 1 / 1   Plan Name: Brain_4_dca Site: Brain   PTV_4_PontineR_14mm Technique: SBRT/SRT-3D Mode: Photon Dose Per Fraction: 16 Gy Prescribed Dose (Delivered / Prescribed): 16 Gy / 16 Gy Prescribed Fxs (Delivered / Prescribed): 1 / 1   Plan Name: Brain_5_dca Site: Brain PTV_1_FrontalL_2.89mm PTV__2_ParietalL_2.77mm PTV_3_FrontalL_2.72mm PTV_5_CerebellarL_1.47mm Technique: SBRT/SRT-3D Mode: Photon Dose Per Fraction: 20 Gy Prescribed Dose (Delivered / Prescribed): 20 Gy / 20 Gy Prescribed Fxs (Delivered / Prescribed): 1 / 1     ==========ON TREATMENT VISIT DATES========== 2023-01-27, 2023-01-27, 2023-01-27, 2023-01-30, 2023-01-30   See weekly On Treatment Notes in Epic for details. The patient tolerated radiation.    The patient will receive a call in about one month from the radiation oncology department. He will continue  follow up with Dr. Arbutus Ped as well as follow in our brain oncology program.      Osker Mason, [facility]

## 2023-02-03 ENCOUNTER — Encounter: Payer: Self-pay | Admitting: Physician Assistant

## 2023-02-03 NOTE — Progress Notes (Signed)
[facility] CSW Progress Note  Clinical Social Worker  connected with pt's wife by phone to answer questions regarding supportive services.  Pt's wife registered for virtual care giver support group.  CSW to continue to provide support as appropriate throughout duration of treatment.      Rachel Moulds, LCSW Clinical Social Worker [facility]

## 2023-02-06 ENCOUNTER — Telehealth: Payer: Self-pay | Admitting: Internal Medicine

## 2023-02-06 NOTE — Telephone Encounter (Signed)
Scheduled per scheduling message, patient has been called and notified of upcoming appointments.

## 2023-02-09 NOTE — Progress Notes (Signed)
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 He continues to follow up with [facility] Urology in surveillance.  s/p partial left nephrectomy at [facility]   PRIOR THERAPY:  1) Systemic chemotherapy/radiation with cisplatin 80 mg per metered squared on day 1 and etoposide 100 mg per metered squared on days 1, 2, and 3 IV every 3 weeks.  Status post 4 cycles.  Starting from cycle #3, cisplatin was changed to carboplatin for an AUC of 5 due to renal insufficiency. First dose on 04/20/20. Onpro was added to the treatment plan starting from cycle #3 due to pancytopenia.  2) Radiation to the right lung, completed on 06/01/20 3) PCI under the care of Dr. Mitzi Hansen. Completed on 08/31/20 4) Robotic nephrectomy on 01/28/21 under the care of Dr. Luane School at [facility] 5) SRS under the care of Dr. Mitzi Hansen for a SRS to the metastatic brain lesions, completed on 01/30/23  CURRENT THERAPY: Obtaining PET scan  INTERVAL HISTORY: Ian Hart 72 y.o. male returns to the clinic today for a follow-up visit accompanied by his wife.  The patient was last seen by Dr. Arbutus Ped on 09/22/2022.  The patient is followed for his history of lung cancer.  When he was last seen, there was no evidence of disease progression in the chest.  He also follows with Dr. Mitzi Hansen and Jill Side for surveillance imaging of the brain. MRI on 11/11/22 that showed punctate foci of enhancement in the left frontal lobe and left cerebellum but it was felt these changes were vascular in nature and to  closely follow with repeat MRI for any interval change. This repeat MRI on 01/12/23 showed an increase in the two lesions in the left frontal (2.5 mm) and left cerebellum (1.5 mm), as well as a new left corona radiata measuring 2.5 mm, medial left parietal lobe measuring 2.5 mm, and a right pontine lesion measuring 1.5 mm.     He underwent SRS, which was completed on 01/30/23. He tolerated it well.   He follows with [facility]rology [facility] for his papillary renal cancer. He is seeing them once a year. He last saw them in May 2024. He denies back pain or urinary change.  Today he denies any fever, chills, night sweats, or unexplained weight loss.  He denies any chest pain, shortness of breath, cough, or hemoptysis.  Denies any nausea, vomiting, diarrhea, or constipation.  Denies any headache or visual changes.  He is here today for evaluation and discuss the next steps in his care.   MEDICAL HISTORY: Past Medical History:  Diagnosis Date   History of chicken pox    History of shingles 04/2010   Lung cancer (HCC) 02/2020    ALLERGIES:  is allergic to acyclovir and related.  MEDICATIONS:  Current Outpatient Medications  Medication Sig Dispense Refill   Multiple Vitamin (MULTIVITAMIN) tablet Take 1 tablet by mouth daily.     tamsulosin (FLOMAX) 0.4 MG CAPS capsule Take 0.4 mg by mouth daily.     Varenicline Tartrate, Starter, (CHANTIX STARTING MONTH PAK)  0.5 MG X 11 & 1 MG X 42 TBPK Take one 0.5 mg tablet by mouth once daily for 3 days, then increase to one 0.5 mg tablet twice daily for 4 days, then increase to one 1 mg tablet twice daily. 53 each 0   Zoster Vaccine Adjuvanted [facility]) injection 0.53ml IM now and again in 2-6 months 0.5 mL 1   No current facility-administered medications for this visit.    SURGICAL HISTORY:  Past Surgical History:  Procedure Laterality Date   BRONCHIAL BIOPSY  03/22/2020   Procedure: BRONCHIAL BIOPSIES;  Surgeon: Josephine Igo, DO;  Location:  [facility];  Service: Pulmonary;;   BRONCHIAL BRUSHINGS  03/22/2020   Procedure: BRONCHIAL BRUSHINGS;  Surgeon: Josephine Igo, DO;  Location: [facility];  Service: Pulmonary;;   BRONCHIAL NEEDLE ASPIRATION BIOPSY  03/22/2020   Procedure: BRONCHIAL NEEDLE ASPIRATION BIOPSIES;  Surgeon: Josephine Igo, DO;  Location: [facility];  Service: Pulmonary;;   BRONCHIAL WASHINGS  03/22/2020   Procedure: BRONCHIAL WASHINGS;  Surgeon: Josephine Igo, DO;  Location: [facility];  Service: Pulmonary;;   FIDUCIAL MARKER PLACEMENT  03/22/2020   Procedure: FIDUCIAL MARKER PLACEMENT;  Surgeon: Josephine Igo, DO;  Location: [facility];  Service: Pulmonary;;   NO PAST SURGERIES     ROBOTIC ASSITED PARTIAL NEPHRECTOMY Left 2022   VIDEO BRONCHOSCOPY WITH ENDOBRONCHIAL NAVIGATION N/A 03/22/2020   Procedure: VIDEO BRONCHOSCOPY WITH ENDOBRONCHIAL NAVIGATION;  Surgeon: Josephine Igo, DO;  Location: [facility];  Service: Pulmonary;  Laterality: N/A;   VIDEO BRONCHOSCOPY WITH ENDOBRONCHIAL ULTRASOUND N/A 03/22/2020   Procedure: VIDEO BRONCHOSCOPY WITH ENDOBRONCHIAL ULTRASOUND;  Surgeon: Josephine Igo, DO;  Location: [facility];  Service: Pulmonary;  Laterality: N/A;    REVIEW OF SYSTEMS:   Review of Systems  Constitutional: Negative for appetite change, chills, fatigue, fever and unexpected weight change.  HENT: Negative for mouth sores, nosebleeds, sore throat and trouble swallowing.   Eyes: Negative for eye problems and icterus.  Respiratory: Negative for cough, hemoptysis, shortness of breath and wheezing.   Cardiovascular: Negative for chest pain and leg swelling.  Gastrointestinal: Negative for abdominal pain, constipation, diarrhea, nausea and vomiting.  Genitourinary: Negative for bladder incontinence, difficulty urinating, dysuria, frequency and hematuria.   Musculoskeletal: Negative for back pain, gait problem, neck pain and neck stiffness.  Skin: Negative for itching and rash.   Neurological: Negative for dizziness, extremity weakness, gait problem, headaches, light-headedness and seizures.  Hematological: Negative for adenopathy. Does not bruise/bleed easily.  Psychiatric/Behavioral: Negative for confusion, depression and sleep disturbance. The patient is not nervous/anxious.     PHYSICAL EXAMINATION:  There were no vitals taken for this visit.  ECOG PERFORMANCE STATUS: 1  Physical Exam  Constitutional: Oriented to person, place, and time and thin appearing male, and in no distress.  HENT:  Head: Normocephalic and atraumatic.  Mouth/Throat: Oropharynx is clear and moist. No oropharyngeal exudate.  Eyes: Conjunctivae are normal. Right eye exhibits no discharge. Left eye exhibits no discharge. No scleral icterus.  Neck: Normal range of motion. Neck supple.  Cardiovascular: Normal rate, regular rhythm, normal heart sounds and intact distal pulses.   Pulmonary/Chest: Effort normal. Quiet breath sounds bilaterally. No respiratory distress. No wheezes. No rales.  Abdominal: Soft. Bowel sounds are normal. Exhibits no distension and no mass. There is no tenderness.  Musculoskeletal: Normal range of motion. Exhibits no edema.  Lymphadenopathy:    No cervical adenopathy.  Neurological: Alert and oriented to person, place, and time. Exhibits normal muscle tone.  Gait normal. Coordination normal.  Skin: Skin is warm and dry. No rash noted. Not diaphoretic. No erythema. No pallor.  Psychiatric: Mood, memory and judgment normal.  Vitals reviewed.  LABORATORY DATA: Lab Results  Component Value Date   WBC 5.3 09/17/2022   HGB 13.9 09/17/2022   HCT 40.1 09/17/2022   MCV 93.0 09/17/2022   PLT 199 09/17/2022      Chemistry      Component Value Date/Time   NA 134 (L) 09/17/2022 0742   K 4.2 09/17/2022 0742   CL 99 09/17/2022 0742   CO2 26 09/17/2022 0742   BUN 13 01/21/2023 1341   CREATININE 1.09 01/21/2023 1341   CREATININE 0.86 11/12/2011 0917      Component  Value Date/Time   CALCIUM 9.8 09/17/2022 0742   ALKPHOS 92 09/17/2022 0742   AST 44 (H) 09/17/2022 0742   ALT 33 09/17/2022 0742   BILITOT 0.7 09/17/2022 0742       RADIOGRAPHIC STUDIES:  MR Brain W Wo Contrast  Result Date: 01/16/2023 CLINICAL DATA:  Small cell lung cancer. Previous abnormal MRI of the brain. EXAM: MRI HEAD WITHOUT AND WITH CONTRAST TECHNIQUE: Multiplanar, multiecho pulse sequences of the brain and surrounding structures were obtained without and with intravenous contrast. CONTRAST:  6mL GADAVIST GADOBUTROL 1 MMOL/ML IV SOLN COMPARISON:  MR head 11/11/2022 FINDINGS: Brain: The previously noted enhancing lesions of the posterior left frontal lobe in the left cerebellar hemisphere have increased in size. All lesions are measured on the postcontrast axial reformatted series, series 1100 The posterior left frontal lobe lesion now measures 2.5 mm on image 240. Focal restricted diffusion is associated with this lesion. No associated hemorrhage is present. The left cerebellar lesion measures 1.5 mm on image 114. A new right pontine lesion measures 2 mm on image 149. A 2.5 mm lesion is present in the medial left parietal lobe on image 203. A new lesion in the left corona radiata measures 2.5 mm on image 219. Mild periventricular T2 hyperintensities bilaterally are stable. No acute infarct or hemorrhage is present. Mild generalized atrophy is stable. The ventricles are proportionate to the degree of atrophy. No significant extraaxial fluid collection is present. The brainstem and cerebellum are within normal limits. The internal auditory canals are within normal limits. Midline structures are within normal limits. Vascular: Flow is present in the major intracranial arteries. Skull and upper cervical spine: The craniocervical junction is normal. Upper cervical spine is within normal limits. Marrow signal is unremarkable. Sinuses/Orbits: Bilateral mastoid effusions are present. No obstructing  nasopharyngeal lesion is present. Paranasal sinuses are otherwise clear. The globes and orbits are within normal limits. IMPRESSION: 1. Interval increase in size of enhancing lesions of the posterior left frontal lobe and left cerebellar hemisphere. 2. New lesions are present in the right pons, medial left parietal lobe, and left corona radiata. A total of 5 enhancing lesions are now present, consistent with metastatic disease. 3. Mild generalized atrophy and white matter disease is stable. This likely reflects the sequela of chronic microvascular ischemia. 4. Bilateral mastoid effusions. No obstructing nasopharyngeal lesion is present. Electronically Signed   By: Marin Roberts M.D.   On: 01/16/2023 14:42     ASSESSMENT/PLAN:  This is a very pleasant 72 year old Caucasian male diagnosed with poorly differentiated carcinoma consistent with adenocarcinoma in the right upper lobe.  In addition to small cell lung cancer in the station 7 lymph node diagnosed in December 2021.     He is status  post a course of systemic chemotherapy initially with cisplatin and etoposide but the cisplatin was discontinued secondary to renal insufficiency and the patient continued 3 more cycles of his systemic chemotherapy with carboplatin and etoposide concurrent with radiation. He has a rough time with the treatment including pancytopenia as well as fatigue and weight loss.    He completed PCI under the care of Dr. Mitzi Hansen 08/31/20.    For the left renal mass, he underwent partial left nephrectomy at [facility] and it was consistent with papillary renal cell carcinoma type I with nuclear grade 2 and negative margin.   He was followed with brain MRI for surveillance imaging.  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  He underwent SRS the most recent dose being on 01/30/2023.  The patient was seen with Dr. Arbutus Ped today.  Dr. Arbutus Ped recommended a PET scan for staging to see if  the patient needs to be placed on systemic therapy.  I have placed the order.  We will see the patient back for follow-up visit in 2 weeks to review the PET scan results and discuss the next steps in his care.  The patient was advised to call immediately if he has any concerning symptoms in the interval. The patient voices understanding of current disease status and treatment options and is in agreement with the current care plan. All questions were answered. The patient knows to call the clinic with any problems, questions or concerns. We can certainly see the patient much sooner if necessary  No orders of the defined types were placed in this encounter.   Hennesy Sobalvarro L Obed Samek, PA-C 02/09/23  ADDENDUM: Hematology/Oncology Attending: I had a face-to-face encounter with the patient today.  I reviewed his record, lab, and recommended his care plan.  This is a very pleasant 72 years old white male with history of poorly differentiated carcinoma consistent with adenocarcinoma of the right upper lobe of the lung in addition to small cell lung cancer involving station 7 lymph node diagnosed in December 2021 status post treatment with systemic chemotherapy with cisplatin and etoposide concurrent with radiation followed by prophylactic cranial irradiation in May 2022 and he has been on observation since that time.  The patient was found recently to have evidence for metastatic disease to the brain in September 2024 and he is currently undergoing SRS to brain metastasis under the care of Dr. Mitzi Hansen completed on January 30, 2023. The patient is here today for reevaluation after his metastatic disease to the brain. I had a lengthy discussion with the patient and his wife about his current condition and treatment options. I recommended for the patient to have repeat PET scan for further evaluation of his disease and to rule out any other metastatic disease systemically. I will see the patient back for  follow-up visit in around 2-3 weeks for evaluation and discussion of his PET scan results and further recommendation regarding treatment of his condition. The patient and his wife are in agreement with the current plan. He was advised to call immediately if he has any other concerning symptoms in the interval. The total time spent in the appointment was 30 minutes. Disclaimer: This note was dictated with voice recognition software. Similar sounding words can inadvertently be transcribed and may be missed upon review. Lajuana Matte, MD

## 2023-02-12 ENCOUNTER — Inpatient Hospital Stay: Payer: Medicare Other

## 2023-02-12 ENCOUNTER — Inpatient Hospital Stay: Payer: Medicare Other | Admitting: Physician Assistant

## 2023-02-12 VITALS — BP 134/77 | HR 81 | Temp 98.2°F | Resp 16 | Wt 131.5 lb

## 2023-02-12 DIAGNOSIS — C3411 Malignant neoplasm of upper lobe, right bronchus or lung: Secondary | ICD-10-CM

## 2023-02-12 DIAGNOSIS — Z923 Personal history of irradiation: Secondary | ICD-10-CM | POA: Insufficient documentation

## 2023-02-12 DIAGNOSIS — C3490 Malignant neoplasm of unspecified part of unspecified bronchus or lung: Secondary | ICD-10-CM | POA: Diagnosis not present

## 2023-02-12 DIAGNOSIS — Z9221 Personal history of antineoplastic chemotherapy: Secondary | ICD-10-CM | POA: Diagnosis not present

## 2023-02-12 DIAGNOSIS — C7931 Secondary malignant neoplasm of brain: Secondary | ICD-10-CM | POA: Insufficient documentation

## 2023-02-12 DIAGNOSIS — C642 Malignant neoplasm of left kidney, except renal pelvis: Secondary | ICD-10-CM | POA: Insufficient documentation

## 2023-02-12 LAB — CBC WITH DIFFERENTIAL (CANCER CENTER ONLY)
Abs Immature Granulocytes: 0.02 10*3/uL (ref 0.00–0.07)
Basophils Absolute: 0 10*3/uL (ref 0.0–0.1)
Basophils Relative: 1 %
Eosinophils Absolute: 0.1 10*3/uL (ref 0.0–0.5)
Eosinophils Relative: 2 %
HCT: 37 % — ABNORMAL LOW (ref 39.0–52.0)
Hemoglobin: 12.9 g/dL — ABNORMAL LOW (ref 13.0–17.0)
Immature Granulocytes: 0 %
Lymphocytes Relative: 8 %
Lymphs Abs: 0.5 10*3/uL — ABNORMAL LOW (ref 0.7–4.0)
MCH: 32.7 pg (ref 26.0–34.0)
MCHC: 34.9 g/dL (ref 30.0–36.0)
MCV: 93.9 fL (ref 80.0–100.0)
Monocytes Absolute: 0.6 10*3/uL (ref 0.1–1.0)
Monocytes Relative: 10 %
Neutro Abs: 4.6 10*3/uL (ref 1.7–7.7)
Neutrophils Relative %: 79 %
Platelet Count: 180 10*3/uL (ref 150–400)
RBC: 3.94 MIL/uL — ABNORMAL LOW (ref 4.22–5.81)
RDW: 12.3 % (ref 11.5–15.5)
WBC Count: 5.8 10*3/uL (ref 4.0–10.5)
nRBC: 0 % (ref 0.0–0.2)

## 2023-02-12 LAB — CMP (CANCER CENTER ONLY)
ALT: 12 U/L (ref 0–44)
AST: 17 U/L (ref 15–41)
Albumin: 4.3 g/dL (ref 3.5–5.0)
Alkaline Phosphatase: 71 U/L (ref 38–126)
Anion gap: 5 (ref 5–15)
BUN: 12 mg/dL (ref 8–23)
CO2: 28 mmol/L (ref 22–32)
Calcium: 9.4 mg/dL (ref 8.9–10.3)
Chloride: 95 mmol/L — ABNORMAL LOW (ref 98–111)
Creatinine: 1.1 mg/dL (ref 0.61–1.24)
GFR, Estimated: >60 mL/min (ref >60)
Glucose, Bld: 106 mg/dL — ABNORMAL HIGH (ref 70–99)
Potassium: 4.1 mmol/L (ref 3.5–5.1)
Sodium: 128 mmol/L — ABNORMAL LOW (ref 135–145)
Total Bilirubin: 0.6 mg/dL (ref 0.3–1.2)
Total Protein: 6.9 g/dL (ref 6.5–8.1)

## 2023-02-20 ENCOUNTER — Encounter (HOSPITAL_COMMUNITY)
Admission: RE | Admit: 2023-02-20 | Discharge: 2023-02-20 | Disposition: A | Discharge status: Home / Self Care | Payer: Medicare Other | Source: Ambulatory Visit | Attending: Physician Assistant | Admitting: Physician Assistant

## 2023-02-20 DIAGNOSIS — C3411 Malignant neoplasm of upper lobe, right bronchus or lung: Secondary | ICD-10-CM | POA: Diagnosis present

## 2023-02-20 DIAGNOSIS — C3490 Malignant neoplasm of unspecified part of unspecified bronchus or lung: Secondary | ICD-10-CM | POA: Diagnosis present

## 2023-02-20 LAB — GLUCOSE, CAPILLARY: Glucose-Capillary: 61 mg/dL — ABNORMAL LOW (ref 70–99)

## 2023-02-20 MED ORDER — FLUDEOXYGLUCOSE F - 18 (FDG) INJECTION: 6.7300 | Freq: Once | INTRAVENOUS | Status: DC

## 2023-02-28 NOTE — Progress Notes (Unsigned)
[facility] OFFICE PROGRESS NOTE  Ian Craze, NP 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 He continues to follow up with [facility] in Urology in surveillance.  s/p partial left nephrectomy at [facility]  PRIOR THERAPY: 1) Systemic chemotherapy/radiation with cisplatin 80 mg per metered squared on day 1 and etoposide 100 mg per metered squared on days 1, 2, and 3 IV every 3 weeks.  Status post 4 cycles.  Starting from cycle #3, cisplatin was changed to carboplatin for an AUC of 5 due to renal insufficiency. First dose on 04/20/20. Onpro was added to the treatment plan starting from cycle #3 due to pancytopenia.  2) Radiation to the right lung, completed on 06/01/20 3) PCI under the care of Dr. Mitzi Hansen. Completed on 08/31/20 4) Robotic nephrectomy on 01/28/21 under the care of Dr. Luane School at [facility] 5) SRS under the care of Dr. Mitzi Hansen for a SRS to the metastatic brain lesions, completed on 01/30/23    CURRENT THERAPY: Re-referral to radiation for consideration of radiation to the right hilar lymph node   INTERVAL HISTORY: Ian Hart 72 y.o. male returns to the clinic today for a follow-up visit accompanied by his wife. The patient was last seen by Dr. Arbutus Ped and myself on 02/12/23.     He also follows with Dr. Mitzi Hansen and Jill Side for surveillance imaging of the brain. MRI on 11/11/22 showed punctate foci of enhancement in the left frontal lobe and left cerebellum but it was felt these changes were vascular in nature and to closely follow with repeat MRI for any interval change. This repeat  MRI on 01/12/23 showed an increase in the two lesions in the left frontal (2.5 mm) and left cerebellum (1.5 mm), as well as a new left corona radiata measuring 2.5 mm, medial left parietal lobe measuring 2.5 mm, and a right pontine lesion measuring 1.5 mm.  He underwent SRS to these 5 areas, which was completed on 01/30/23. He tolerated it well.   He saw [name] to determine if any systemic treatment was needed. Dr. Arbutus Ped recommended a PET scan before determining next steps.   He follows with urology at [facility] for his papillary renal cancer. He is seeing them once a year. He last saw them in May 2024. He denies back pain or urinary change.   Today he denies any fever, chills, night sweats, or unexplained weight loss. His wife mentions he is eating better. He denies any chest pain, shortness of breath, cough, or hemoptysis. Denies any nausea, vomiting, diarrhea, or constipation. Denies any headache or visual changes. He is here today for evaluation and to review his PET scan and discuss the next steps in his care.   MEDICAL HISTORY: Past Medical History:  Diagnosis Date   History of chicken pox    History of shingles 04/2010   Lung cancer (HCC) 02/2020    ALLERGIES:  is allergic to acyclovir and related.  MEDICATIONS:  Current Outpatient Medications  Medication Sig Dispense Refill   Multiple Vitamin (MULTIVITAMIN) tablet Take 1 tablet by mouth daily.     tamsulosin (FLOMAX) 0.4  MG CAPS capsule Take 0.4 mg by mouth daily.     Varenicline Tartrate, Starter, (CHANTIX STARTING MONTH PAK) 0.5 MG X 11 & 1 MG X 42 TBPK Take one 0.5 mg tablet by mouth once daily for 3 days, then increase to one 0.5 mg tablet twice daily for 4 days, then increase to one 1 mg tablet twice daily. 53 each 0   Zoster Vaccine Adjuvanted [facility]) injection 0.14ml IM now and again in 2-6 months 0.5 mL 1   No current facility-administered medications for this visit.    SURGICAL HISTORY:  Past Surgical History:   Procedure Laterality Date   BRONCHIAL BIOPSY  03/22/2020   Procedure: BRONCHIAL BIOPSIES;  Surgeon: Josephine Igo, DO;  Location: [facility];  Service: Pulmonary;;   BRONCHIAL BRUSHINGS  03/22/2020   Procedure: BRONCHIAL BRUSHINGS;  Surgeon: Josephine Igo, DO;  Location: [facility];  Service: Pulmonary;;   BRONCHIAL NEEDLE ASPIRATION BIOPSY  03/22/2020   Procedure: BRONCHIAL NEEDLE ASPIRATION BIOPSIES;  Surgeon: Josephine Igo, DO;  Location: [facility];  Service: Pulmonary;;   BRONCHIAL WASHINGS  03/22/2020   Procedure: BRONCHIAL WASHINGS;  Surgeon: Josephine Igo, DO;  Location: [facility];  Service: Pulmonary;;   FIDUCIAL MARKER PLACEMENT  03/22/2020   Procedure: FIDUCIAL MARKER PLACEMENT;  Surgeon: Josephine Igo, DO;  Location: [facility];  Service: Pulmonary;;   NO PAST SURGERIES     ROBOTIC ASSITED PARTIAL NEPHRECTOMY Left 2022   VIDEO BRONCHOSCOPY WITH ENDOBRONCHIAL NAVIGATION N/A 03/22/2020   Procedure: VIDEO BRONCHOSCOPY WITH ENDOBRONCHIAL NAVIGATION;  Surgeon: Josephine Igo, DO;  Location: [facility];  Service: Pulmonary;  Laterality: N/A;   VIDEO BRONCHOSCOPY WITH ENDOBRONCHIAL ULTRASOUND N/A 03/22/2020   Procedure: VIDEO BRONCHOSCOPY WITH ENDOBRONCHIAL ULTRASOUND;  Surgeon: Josephine Igo, DO;  Location: [facility];  Service: Pulmonary;  Laterality: N/A;    REVIEW OF SYSTEMS:   Review of Systems  Constitutional: Negative for appetite change, chills, fatigue, fever and unexpected weight change.  HENT:   Negative for mouth sores, nosebleeds, sore throat and trouble swallowing.   Eyes: Negative for eye problems and icterus.  Respiratory: Negative for cough, hemoptysis, shortness of breath and wheezing.   Cardiovascular: Negative for chest pain and leg swelling.  Gastrointestinal: Negative for abdominal pain, constipation, diarrhea, nausea and vomiting.  Genitourinary: Negative for bladder incontinence, difficulty urinating, dysuria, frequency and  hematuria.   Musculoskeletal: Negative for back pain, gait problem, neck pain and neck stiffness.  Skin: Negative for itching and rash.  Neurological: Negative for dizziness, extremity weakness, gait problem, headaches, light-headedness and seizures.  Hematological: Negative for adenopathy. Does not bruise/bleed easily.  Psychiatric/Behavioral: Negative for confusion, depression and sleep disturbance. The patient is not nervous/anxious.     PHYSICAL EXAMINATION:  Blood pressure 133/70, pulse 76, temperature 97.7 F (36.5 C), temperature source Tympanic, resp. rate 18, height 6\' 1"  (1.854 m), weight 132 lb (59.9 kg), SpO2 100%.  ECOG PERFORMANCE STATUS: 1  Physical Exam  Constitutional: Oriented to person, place, and time and thin appearing male, and in no distress.  HENT:  Head: Normocephalic and atraumatic.  Mouth/Throat: Oropharynx is clear and moist. No oropharyngeal exudate.  Eyes: Conjunctivae are normal. Right eye exhibits no discharge. Left eye exhibits no discharge. No scleral icterus.  Neck: Normal range of motion. Neck supple.  Cardiovascular: Normal rate, regular rhythm, normal heart sounds and intact distal pulses.   Pulmonary/Chest: Effort normal. Quiet breath sounds bilaterally. No respiratory distress. No wheezes. No rales.  Abdominal: Soft. Bowel sounds are  normal. Exhibits no distension and no mass. There is no tenderness.  Musculoskeletal: Normal range of motion. Exhibits no edema.  Lymphadenopathy:    No cervical adenopathy.  Neurological: Alert and oriented to person, place, and time. Exhibits normal muscle tone. Gait normal. Coordination normal.  Skin: Skin is warm and dry. No rash noted. Not diaphoretic. No erythema. No pallor.  Psychiatric: Mood, memory and judgment normal.  Vitals reviewed.  LABORATORY DATA: Lab Results  Component Value Date   WBC 5.8 02/12/2023   HGB 12.9 (L) 02/12/2023   HCT 37.0 (L) 02/12/2023   MCV 93.9 02/12/2023   PLT 180  02/12/2023      Chemistry      Component Value Date/Time   NA 128 (L) 02/12/2023 1018   K 4.1 02/12/2023 1018   CL 95 (L) 02/12/2023 1018   CO2 28 02/12/2023 1018   BUN 12 02/12/2023 1018   CREATININE 1.10 02/12/2023 1018   CREATININE 0.86 11/12/2011 0917      Component Value Date/Time   CALCIUM 9.4 02/12/2023 1018   ALKPHOS 71 02/12/2023 1018   AST 17 02/12/2023 1018   ALT 12 02/12/2023 1018   BILITOT 0.6 02/12/2023 1018       RADIOGRAPHIC STUDIES:  NM PET Image Restag (PS) Skull Base To Thigh  Result Date: 03/04/2023 CLINICAL DATA:  Subsequent treatment strategy for small cell lung cancer. EXAM: NUCLEAR MEDICINE PET SKULL BASE TO THIGH TECHNIQUE: 6.73 mCi F-18 FDG was injected intravenously. Full-ring PET imaging was performed from the skull base to thigh after the radiotracer. CT data was obtained and used for attenuation correction and anatomic localization. Fasting blood glucose: 61 mg/dl COMPARISON:  CT chest 40/98/1191, PET-CT 03/01/2020 FINDINGS: Mediastinal blood pool activity: SUV max 2.21 Liver activity: SUV max NA NECK: No hypermetabolic lymph nodes in the neck. Incidental CT findings: None. CHEST: No tracer avid mediastinal, hilar, axillary, or supraclavicular lymph nodes. Right hilar lymph node, adjacent to the right middle lobe bronchus measures approximately 1.1 cm and has an SUV max of 6.62, image 40/7. The treated lesion within the medial right upper lobe with associated fiducial markers is again noted in appears stable measuring 6 mm with SUV max of 1.43. Incidental CT findings: Moderate emphysema. Aortic atherosclerosis. Mitral valve calcifications. ABDOMEN/PELVIS: No abnormal hypermetabolic activity within the liver, pancreas, adrenal glands, or spleen. No hypermetabolic lymph nodes in the abdomen or pelvis. Incidental CT findings: Aortic atherosclerotic calcifications. SKELETON: No focal hypermetabolic activity to suggest skeletal metastasis. Incidental CT findings:  None. IMPRESSION: 1. There is a 1.1 cm right hilar lymph node adjacent to the right middle lobe bronchus which is hypermetabolic with SUV max of 6.62. Findings are concerning for recurrent bronchogenic carcinoma. 2. Stable appearance of treated lesion within the medial right upper lobe with associated fiducial markers. 3. No signs of tracer avid metastatic disease to the abdomen or pelvis. 4. Aortic Atherosclerosis (ICD10-I70.0) and Emphysema (ICD10-J43.9). Electronically Signed   By: Signa Kell M.D.   On: 03/04/2023 09:37     ASSESSMENT/PLAN:  This is a very pleasant 72 year old Caucasian male diagnosed with poorly differentiated carcinoma consistent with adenocarcinoma in the right upper lobe.  In addition to small cell lung cancer in the station 7 lymph node diagnosed in December 2021.      He is status post a course of systemic chemotherapy initially with cisplatin and etoposide but the cisplatin was discontinued secondary to renal insufficiency and the patient continued 3 more cycles of his systemic chemotherapy  with carboplatin and etoposide concurrent with radiation. He has a rough time with the treatment including pancytopenia as well as fatigue and weight loss.    He completed PCI under the care of Dr. Mitzi Hansen 08/31/20.   For the left renal mass, he underwent partial left nephrectomy at [facility] and it was consistent with papillary renal cell carcinoma type I with nuclear grade 2 and negative margin.    He was followed with brain MRI for surveillance imaging.  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  He underwent SRS the most recent dose being on 01/30/2023  Dr. Arbutus Ped recommends a PET scan to make decision to determine if systemic treatment is needed.   The patient was seen with Dr. Arbutus Ped today.  Dr. Arbutus Ped personally and independently reviewed the scan and discussed results with the patient today.  The scan showed a hypermetabolic right  hilar lymph node.  Dr. Arbutus Ped recommends Referral to radiation oncology to see if they would be able to administer radiation to this area.  Dr. Arbutus Ped recommends close monitoring with surveillance CT scans.  If the patient has any progressive disease in the future, he likely would need systemic chemotherapy.  I have referred the patient back to radiation oncology  I will arrange for repeat CT scan of the chest in 3 months.  We will see him back for follow-up visit 1 week after the scan to review the results in the office.  His next brain MRI is scheduled for 04/28/22.   The patient was advised to call immediately if he has any concerning symptoms in the interval. The patient voices understanding of current disease status and treatment options and is in agreement with the current care plan. All questions were answered. The patient knows to call the clinic with any problems, questions or concerns. We can certainly see the patient much sooner if necessary    Orders Placed This Encounter  Procedures   CT Chest W Contrast    Standing Status:   Future    Standing Expiration Date:   03/03/2024    Order Specific Question:   If indicated for the ordered procedure, I authorize the administration of contrast media per Radiology protocol    Answer:   Yes    Order Specific Question:   Does the patient have a contrast media/X-ray dye allergy?    Answer:   No    Order Specific Question:   Preferred imaging location?    Answer:   [facility]   CT ABDOMEN PELVIS W CONTRAST    Standing Status:   Future    Standing Expiration Date:   03/03/2024    Order Specific Question:   If indicated for the ordered procedure, I authorize the administration of contrast media per Radiology protocol    Answer:   Yes    Order Specific Question:   Does the patient have a contrast media/X-ray dye allergy?    Answer:   No    Order Specific Question:   Preferred imaging location?    Answer:   [facility]     Order Specific Question:   If indicated for the ordered procedure, I authorize the administration of oral contrast media per Radiology protocol    Answer:   Yes   CBC with Differential (Cancer Center Only)    Standing Status:   Future    Standing Expiration Date:   03/03/2024   CMP (Cancer Center only)  Standing Status:   Future    Standing Expiration Date:   03/03/2024   Ambulatory referral to Radiation Oncology    Referral Priority:   Routine    Referral Type:   Consultation    Referral Reason:   Specialty Services Required    Requested Specialty:   Radiation Oncology    Number of Visits Requested:   1     Vandy Tsuchiya L Makella Buckingham, PA-C 03/04/23  ADDENDUM: Hematology/Oncology Attending: I had a face-to-face encounter with the patient today.  I reviewed his record, lab, scan and recommended his care plan.  This is a very pleasant 72 years old white male with history of small cell lung cancer as well as poorly differentiated carcinoma consistent with adenocarcinoma of the right upper lobe diagnosed in December 2021 status post systemic chemotherapy with cisplatin and dose etoposide concurrent with radiation and has been on observation for more than 2 years before he has evidence of metastatic disease to the brain in September 2024 treated with SRS to the brain metastasis under the care of Dr. Mitzi Hansen.  We saw the patient few weeks ago and I recommended for him to have a PET scan for further evaluation of his disease.  I personally and independently reviewed the PET scan images and discussed the result with the patient and his wife. His PET scan showed 1.1 cm right hilar lymph node adjacent to the right middle lobe bronchus that is hypermetabolic and concerning for recurrent bronchogenic carcinoma.  There was a stable appearance of the treated lesion within the medial right upper lobe and no signs of tracer avid metastatic disease to the abdomen or pelvis. I discussed with the patient his  treatment options and I recommended for him to see Dr. Mitzi Hansen for consideration of palliative radiotherapy to the right hilar lymph node. I will continue to monitor the patient closely for any other evidence of metastatic disease before considering him for systemic therapy.  We will see him back for follow-up visit in 3 months for evaluation with repeat imaging studies. The patient and his wife are in agreement with the current plan. He was advised to call immediately if he has any other concerning symptoms in the interval. The total time spent in the appointment was 30 minutes. Disclaimer: This note was dictated with voice recognition software. Similar sounding words can inadvertently be transcribed and may be missed upon review. Lajuana Matte, MD

## 2023-03-02 ENCOUNTER — Ambulatory Visit
Admission: RE | Admit: 2023-03-02 | Discharge: 2023-03-02 | Disposition: A | Discharge status: Home / Self Care | Payer: Medicare Other | Source: Ambulatory Visit | Attending: Internal Medicine | Admitting: Internal Medicine

## 2023-03-02 NOTE — Progress Notes (Signed)
  Radiation Oncology         [phone]) [phone] ________________________________  Name: Ian Hart MRN: [id number]  Date of Service: 03/02/2023  DOB: Jun 25, 1950  Post Treatment Telephone Note  Diagnosis:   Recurrent Metastatic Stage IIIA, [id number], mixed histology NSCLC and small cell carcinoma of the RUL and subcarinal nodal station. (as documented in provider EOT note)   The patient was available for call today.  The patient did not note fatigue during radiation. The patient did not note hair loss or skin changes in the field of radiation during therapy. The patient is not taking dexamethasone. The patient does not have symptoms of  weakness or loss of control of the extremities. The patient does not have symptoms of headache. The patient does not have symptoms of seizure or uncontrolled movement. The patient does not have symptoms of changes in vision. The patient does not have changes in speech. The patient does not have confusion.   The patient was counseled that he  will be contacted by our brain and spine navigator to schedule surveillance imaging. The patient was encouraged to call if he  have not received a call to schedule imaging, or if he  develops concerns or questions regarding radiation. The patient will also continue to follow up with Dr. Arbutus Ped in medical oncology.    Ruel Favors, LPN

## 2023-03-04 ENCOUNTER — Inpatient Hospital Stay: Payer: Medicare Other | Attending: Radiation Oncology | Admitting: Physician Assistant

## 2023-03-04 VITALS — BP 133/70 | HR 76 | Temp 97.7°F | Resp 18 | Ht 73.0 in | Wt 132.0 lb

## 2023-03-04 DIAGNOSIS — C3411 Malignant neoplasm of upper lobe, right bronchus or lung: Secondary | ICD-10-CM | POA: Diagnosis present

## 2023-03-04 DIAGNOSIS — C7931 Secondary malignant neoplasm of brain: Secondary | ICD-10-CM | POA: Diagnosis present

## 2023-03-04 DIAGNOSIS — C642 Malignant neoplasm of left kidney, except renal pelvis: Secondary | ICD-10-CM | POA: Diagnosis not present

## 2023-03-04 DIAGNOSIS — Z7189 Other specified counseling: Secondary | ICD-10-CM | POA: Diagnosis not present

## 2023-03-04 DIAGNOSIS — Z923 Personal history of irradiation: Secondary | ICD-10-CM | POA: Insufficient documentation

## 2023-03-04 DIAGNOSIS — F17210 Nicotine dependence, cigarettes, uncomplicated: Secondary | ICD-10-CM | POA: Insufficient documentation

## 2023-03-04 DIAGNOSIS — C3490 Malignant neoplasm of unspecified part of unspecified bronchus or lung: Secondary | ICD-10-CM | POA: Diagnosis not present

## 2023-03-04 DIAGNOSIS — Z9221 Personal history of antineoplastic chemotherapy: Secondary | ICD-10-CM | POA: Insufficient documentation

## 2023-03-05 ENCOUNTER — Telehealth: Payer: Self-pay | Admitting: Internal Medicine

## 2023-03-05 NOTE — Telephone Encounter (Signed)
Spoke with patient confirming upcoming appointments  

## 2023-03-09 NOTE — Progress Notes (Signed)
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   HISTORY OF PRESENT ILLNESS: Ian Hart is a 72 y.o. 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 This was initially found on lung cancer screening scan, and he subsequently underwent a PET scan on 03/01/2020 which revealed hypermetabolism within the right upper lobe nodule as well as an isolated hypermetabolic subcarinal lymph nodes suspicious for nodal disease.  No other extrathoracic hypermetabolic metastases were identified, a left renal lesion which was indeterminate but hypermetabolic was also seen and possibly suspicious for renal cell carcinoma.  He underwent a CT super D scan on 03/22/2020 and subsequent bronchoscopy.  Interestingly bronchoscopy confirmed adenocarcinoma with in the lung nodule and small cell carcinoma within the 7 station lymph node.  He proceeded with chemoRT between 04/17/20 and 06/01/20. He also proceeded with prophylactic cranial irradiation to reduce risks of brain disease. 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  He was offered evaluation to ENT for mastoid effusion, but ultimately his hearing improved and he did not feel that he needed the appointment. A repeat MRI on 11/11/22 that showed punctate foci of enhancement in the left frontal lobe and left cerebellum but it was felt these changes were vascular in nature and to closely follow with repeat MRI for any interval change. This repeat MRI on 01/12/23 showed an increase  in the two lesions in the left frontal (2.5 mm) and left cerebellum (1.5 mm), as well as a new left corona radiata measuring 2.5 mm, medial left parietal lobe measuring 2.5 mm, and a right pontine lesion measuring 1.5 mm.  He was counseled on the new findings and elected to undergo stereotactic radiosurgery for all 5 lesions.  He recently had restaging scans with PET imaging on 02/20/2023 given the recurrence in the brain, and this identified a 1.1 cm right hilar lymph node adjacent to the right middle lobe bronchus with an SUV of 6.62, stable appearance in the medial right upper lobe with associated fiducial markers was noted and no evidence of other sites of metastatic disease were present.  Continued atherosclerotic and emphysematous changes were noted by the scan, he met with medical oncology and they recommended consideration of focal radiation for his hilar lymph node on the right.  He is seen today to consider radiation to this site as well.    On review of systems, the patient states he is doing well. No new complaints.  PREVIOUS RADIATION THERAPY:   01/27/23-01/30/23 SRS Treatment  Plan Name: Brain_1_dca Site: Brain PTV_1_FrontalL_2.80mm Technique: SBRT/SRT-3D Mode: Photon Dose Per Fraction: 20 Gy Prescribed Dose (Delivered / Prescribed): 20 Gy / 20 Gy Prescribed Fxs (Delivered / Prescribed): 1 / 1   Plan Name: Brain_2_dca Site: Brain PTV__2_ParietalL_2.64mm Technique: SBRT/SRT-3D Mode: Photon Dose Per Fraction: 20 Gy Prescribed Dose (Delivered / Prescribed): 20 Gy / 20 Gy Prescribed Fxs (Delivered / Prescribed): 1 / 1   Plan Name: Brain_3_dca Site: Brain PTV_3_FrontalL_2.65mm Technique: SBRT/SRT-3D Mode: Photon Dose Per  Fraction: 20 Gy Prescribed Dose (Delivered / Prescribed): 20 Gy / 20 Gy Prescribed Fxs (Delivered / Prescribed): 1 / 1   Plan Name: Brain_4_dca Site: Brain   PTV_4_PontineR_33mm Technique: SBRT/SRT-3D Mode: Photon Dose Per Fraction: 16 Gy Prescribed  Dose (Delivered / Prescribed): 16 Gy / 16 Gy Prescribed Fxs (Delivered / Prescribed): 1 / 1   Plan Name: Brain_5_dca Site: Brain PTV_5_CerebellarL_1.18mm Technique: SBRT/SRT-3D Mode: Photon Dose Per Fraction: 20 Gy Prescribed Dose (Delivered / Prescribed): 20 Gy / 20 Gy Prescribed Fxs (Delivered / Prescribed): 1 / 1   08/20/2020 through 08/31/2020 Site Technique Total Dose (Gy) Dose per Fx (Gy) Completed Fx Beam Energies  Brain: Brain Complex 25/25 2.5 10/10 6X     Radiation Treatment Dates: 04/17/2020 through 06/01/2020 Site Technique Total Dose (Gy) Dose per Fx (Gy) Completed Fx Beam Energies  Lung, Right: Lung_Rt 3D 60/60 2 30/30 6X, 10X  Lung, Right: Lung_Rt_Bst 3D 6/6 2 3/3 6X, 10X     PAST MEDICAL HISTORY:  Past Medical History:  Diagnosis Date   History of chicken pox    History of shingles 04/2010   Lung cancer (HCC) 02/2020       PAST SURGICAL HISTORY: Past Surgical History:  Procedure Laterality Date   BRONCHIAL BIOPSY  03/22/2020   Procedure: BRONCHIAL BIOPSIES;  Surgeon: Josephine Igo, DO;  Location: [facility];  Service: Pulmonary;;   BRONCHIAL BRUSHINGS  03/22/2020   Procedure: BRONCHIAL BRUSHINGS;  Surgeon: Josephine Igo, DO;  Location: [facility];  Service: Pulmonary;;   BRONCHIAL NEEDLE ASPIRATION BIOPSY  03/22/2020   Procedure: BRONCHIAL NEEDLE ASPIRATION BIOPSIES;  Surgeon: Josephine Igo, DO;  Location: [facility];  Service: Pulmonary;;   BRONCHIAL WASHINGS  03/22/2020   Procedure: BRONCHIAL WASHINGS;  Surgeon: Josephine Igo, DO;  Location: [facility];  Service: Pulmonary;;   FIDUCIAL MARKER PLACEMENT  03/22/2020   Procedure: FIDUCIAL MARKER PLACEMENT;  Surgeon: Josephine Igo, DO;  Location: [facility];  Service: Pulmonary;;   NO PAST SURGERIES     ROBOTIC ASSITED PARTIAL NEPHRECTOMY Left 2022   VIDEO BRONCHOSCOPY WITH ENDOBRONCHIAL NAVIGATION N/A 03/22/2020   Procedure: VIDEO BRONCHOSCOPY WITH ENDOBRONCHIAL NAVIGATION;  Surgeon:  Josephine Igo, DO;  Location: [facility];  Service: Pulmonary;  Laterality: N/A;   VIDEO BRONCHOSCOPY WITH ENDOBRONCHIAL ULTRASOUND N/A 03/22/2020   Procedure: VIDEO BRONCHOSCOPY WITH ENDOBRONCHIAL ULTRASOUND;  Surgeon: Josephine Igo, DO;  Location: [facility];  Service: Pulmonary;  Laterality: N/A;     FAMILY HISTORY:  Family History  Problem Relation Age of Onset   Sudden death Mother    Rheumatic fever Mother    COPD Brother    Liver disease Neg Hx    Colon cancer Neg Hx    Esophageal cancer Neg Hx      SOCIAL HISTORY:  reports that he has been smoking cigarettes. He has a 100 pack-year smoking history. He has never used smokeless tobacco. He reports current alcohol use of about 5.0 standard drinks of alcohol per week. He reports that he does not use drugs. The patient is married and lives in [place]. 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    ALLERGIES: Acyclovir and related   MEDICATIONS:  Current Outpatient Medications  Medication Sig Dispense Refill   Multiple Vitamin (MULTIVITAMIN) tablet Take 1 tablet by mouth daily.     tamsulosin (FLOMAX) 0.4 MG CAPS capsule Take 0.4 mg by mouth daily.     Varenicline Tartrate, Starter, (CHANTIX STARTING MONTH PAK) 0.5  MG X 11 & 1 MG X 42 TBPK Take one 0.5 mg tablet by mouth once daily for 3 days, then increase to one 0.5 mg tablet twice daily for 4 days, then increase to one 1 mg tablet twice daily. 53 each 0   Zoster Vaccine Adjuvanted [facility]) injection 0.97ml IM now and again in 2-6 months 0.5 mL 1   No current facility-administered medications for this visit.     PHYSICAL EXAM:  Wt Readings from Last 3 Encounters:  03/04/23 132 lb (59.9 kg)  02/12/23 131 lb 8 oz (59.6 kg)  01/21/23 129 lb 9.6 oz (58.8 kg)   Temp Readings from Last 3 Encounters:  03/04/23 97.7 F (36.5 C) (Tympanic)  02/12/23 98.2 F (36.8 C) (Oral)  01/30/23 97.9 F (36.6 C)   BP Readings from Last 3 Encounters:   03/04/23 133/70  02/12/23 134/77  01/30/23 129/70   Pulse Readings from Last 3 Encounters:  03/04/23 76  02/12/23 81  01/30/23 78   In general this is a well appearing caucasian male in no acute distress. He's alert and oriented x4 and appropriate throughout the examination. Cardiopulmonary assessment is negative for acute distress and he exhibits normal effort.    ECOG = 0  0 - Asymptomatic (Fully active, able to carry on all predisease activities without restriction)  1 - Symptomatic but completely ambulatory (Restricted in physically strenuous activity but ambulatory and able to carry out work of a light or sedentary nature. For example, light housework, office work)  2 - Symptomatic, <50% in bed during the day (Ambulatory and capable of all self care but unable to carry out any work activities. Up and about more than 50% of waking hours)  3 - Symptomatic, >50% in bed, but not bedbound (Capable of only limited self-care, confined to bed or chair 50% or more of waking hours)  4 - Bedbound (Completely disabled. Cannot carry on any self-care. Totally confined to bed or chair)  5 - Death   Santiago Glad MM, Creech RH, Tormey DC, et al. [phone]). "Toxicity and response criteria of the [facility]". Am. Evlyn Clines. Oncol. 5 (6): 649-55    LABORATORY DATA:  Lab Results  Component Value Date   WBC 5.8 02/12/2023   HGB 12.9 (L) 02/12/2023   HCT 37.0 (L) 02/12/2023   MCV 93.9 02/12/2023   PLT 180 02/12/2023   Lab Results  Component Value Date   NA 128 (L) 02/12/2023   K 4.1 02/12/2023   CL 95 (L) 02/12/2023   CO2 28 02/12/2023   Lab Results  Component Value Date   ALT 12 02/12/2023   AST 17 02/12/2023   ALKPHOS 71 02/12/2023   BILITOT 0.6 02/12/2023      RADIOGRAPHY: NM PET Image Restag (PS) Skull Base To Thigh  Result Date: 03/04/2023 CLINICAL DATA:  Subsequent treatment strategy for small cell lung cancer. EXAM: NUCLEAR MEDICINE PET SKULL BASE TO THIGH  TECHNIQUE: 6.73 mCi F-18 FDG was injected intravenously. Full-ring PET imaging was performed from the skull base to thigh after the radiotracer. CT data was obtained and used for attenuation correction and anatomic localization. Fasting blood glucose: 61 mg/dl COMPARISON:  CT chest 41/32/4401, PET-CT 03/01/2020 FINDINGS: Mediastinal blood pool activity: SUV max 2.21 Liver activity: SUV max NA NECK: No hypermetabolic lymph nodes in the neck. Incidental CT findings: None. CHEST: No tracer avid mediastinal, hilar, axillary, or supraclavicular lymph nodes. Right hilar lymph node, adjacent to the right middle lobe bronchus measures approximately  1.1 cm and has an SUV max of 6.62, image 40/7. The treated lesion within the medial right upper lobe with associated fiducial markers is again noted in appears stable measuring 6 mm with SUV max of 1.43. Incidental CT findings: Moderate emphysema. Aortic atherosclerosis. Mitral valve calcifications. ABDOMEN/PELVIS: No abnormal hypermetabolic activity within the liver, pancreas, adrenal glands, or spleen. No hypermetabolic lymph nodes in the abdomen or pelvis. Incidental CT findings: Aortic atherosclerotic calcifications. SKELETON: No focal hypermetabolic activity to suggest skeletal metastasis. Incidental CT findings: None. IMPRESSION: 1. There is a 1.1 cm right hilar lymph node adjacent to the right middle lobe bronchus which is hypermetabolic with SUV max of 6.62. Findings are concerning for recurrent bronchogenic carcinoma. 2. Stable appearance of treated lesion within the medial right upper lobe with associated fiducial markers. 3. No signs of tracer avid metastatic disease to the abdomen or pelvis. 4. Aortic Atherosclerosis (ICD10-I70.0) and Emphysema (ICD10-J43.9). Electronically Signed   By: Signa Kell M.D.   On: 03/04/2023 09:37        IMPRESSION/PLAN: 1. Recurrent Stage IIIA, [id number], mixed histology NSCLC and small cell carcinoma of the RUL and subcarinal  nodal station.  I discussed the patient's recent course and discusses the findings after personally reviewing his PET scan. I recommend a course of ultra hypofractionation to the right lung.  The positive node which is the only area on PET scan is quite central so we will need to fractionate his treatment over 10 treatments.  We discussed the risks, benefits, short, and long term effects of radiotherapy, as well as the curative intent, and the patient is interested in proceeding. I discussed the delivery and logistics of radiotherapy and anticipates a course of 2 weeks of radiotherapy. Written consent is obtained and placed in the chart, a copy was provided to the patient. He will simulate in the near future for treatment planning. He will have routine MRI surveillance for the brain in January 2025.     2. 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 He continues to follow up with [facility] in surveillance. We will follow this expectantly. 3. Bilateral mastoid effusions with hearing loss. His symptoms improved after medrol dosepak in March 2024, and continues to have stable radiographic findings. He is aware of the ability to re-evaluate this with ENT if he desires.  This encounter was conducted via telephone.  The patient has provided two factor identification and has given verbal consent for this type of encounter and has been advised to only accept a meeting of this type in a secure network environment. The time spent during this encounter was 45 minutes including preparation, discussion, and coordination of the patient's care. Participants on the call were myself and the patient.  ------------------------------------------------  Radene Gunning, MD, PhD

## 2023-03-18 ENCOUNTER — Other Ambulatory Visit: Payer: Medicare Other

## 2023-03-19 ENCOUNTER — Ambulatory Visit
Admission: RE | Admit: 2023-03-19 | Discharge: 2023-03-19 | Disposition: A | Discharge status: Home / Self Care | Payer: Medicare Other | Source: Ambulatory Visit | Attending: Radiation Oncology | Admitting: Radiation Oncology

## 2023-03-19 ENCOUNTER — Encounter: Payer: Self-pay | Admitting: Radiation Oncology

## 2023-03-19 ENCOUNTER — Ambulatory Visit: Payer: Medicare Other | Admitting: Radiation Oncology

## 2023-03-19 DIAGNOSIS — C642 Malignant neoplasm of left kidney, except renal pelvis: Secondary | ICD-10-CM

## 2023-03-19 DIAGNOSIS — C3411 Malignant neoplasm of upper lobe, right bronchus or lung: Secondary | ICD-10-CM

## 2023-03-19 DIAGNOSIS — C3490 Malignant neoplasm of unspecified part of unspecified bronchus or lung: Secondary | ICD-10-CM

## 2023-03-19 DIAGNOSIS — C7931 Secondary malignant neoplasm of brain: Secondary | ICD-10-CM

## 2023-03-19 NOTE — Progress Notes (Signed)
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    Patient identity verified x2.  Patient reports doing well. Patient denies any other related issues at this time.  Meaningful use complete.  Vitals- There were no vitals taken for this visit.  This concludes the interaction.  Ruel Favors, LPN

## 2023-03-25 ENCOUNTER — Ambulatory Visit
Admission: RE | Admit: 2023-03-25 | Discharge: 2023-03-25 | Disposition: A | Discharge status: Home / Self Care | Payer: Medicare Other | Source: Ambulatory Visit | Attending: Radiation Oncology | Admitting: Radiation Oncology

## 2023-03-25 ENCOUNTER — Ambulatory Visit: Payer: Medicare Other | Admitting: Internal Medicine

## 2023-03-25 ENCOUNTER — Other Ambulatory Visit: Payer: Self-pay

## 2023-03-25 DIAGNOSIS — F17210 Nicotine dependence, cigarettes, uncomplicated: Secondary | ICD-10-CM | POA: Insufficient documentation

## 2023-03-25 DIAGNOSIS — C3411 Malignant neoplasm of upper lobe, right bronchus or lung: Secondary | ICD-10-CM | POA: Diagnosis present

## 2023-03-25 DIAGNOSIS — Z510 Encounter for antineoplastic radiation therapy: Secondary | ICD-10-CM | POA: Insufficient documentation

## 2023-04-02 DIAGNOSIS — Z510 Encounter for antineoplastic radiation therapy: Secondary | ICD-10-CM | POA: Diagnosis not present

## 2023-04-06 ENCOUNTER — Ambulatory Visit
Admission: RE | Admit: 2023-04-06 | Discharge: 2023-04-06 | Disposition: A | Discharge status: Home / Self Care | Payer: Medicare Other | Source: Ambulatory Visit | Attending: Radiation Oncology | Admitting: Radiation Oncology

## 2023-04-06 ENCOUNTER — Other Ambulatory Visit: Payer: Self-pay

## 2023-04-06 DIAGNOSIS — Z510 Encounter for antineoplastic radiation therapy: Secondary | ICD-10-CM | POA: Diagnosis not present

## 2023-04-06 LAB — RAD ONC ARIA SESSION SUMMARY
Course Elapsed Days: 0
Plan Fractions Treated to Date: 1
Plan Prescribed Dose Per Fraction: 5 Gy
Plan Total Fractions Prescribed: 10
Plan Total Prescribed Dose: 50 Gy
Reference Point Dosage Given to Date: 5 Gy
Reference Point Session Dosage Given: 5 Gy
Session Number: 1

## 2023-04-07 ENCOUNTER — Ambulatory Visit
Admission: RE | Admit: 2023-04-07 | Discharge: 2023-04-07 | Disposition: A | Discharge status: Home / Self Care | Payer: Medicare Other | Source: Ambulatory Visit | Attending: Radiation Oncology

## 2023-04-07 ENCOUNTER — Other Ambulatory Visit: Payer: Self-pay

## 2023-04-07 DIAGNOSIS — Z510 Encounter for antineoplastic radiation therapy: Secondary | ICD-10-CM | POA: Diagnosis not present

## 2023-04-07 LAB — RAD ONC ARIA SESSION SUMMARY
Course Elapsed Days: 1
Plan Fractions Treated to Date: 2
Plan Prescribed Dose Per Fraction: 5 Gy
Plan Total Fractions Prescribed: 10
Plan Total Prescribed Dose: 50 Gy
Reference Point Dosage Given to Date: 10 Gy
Reference Point Session Dosage Given: 5 Gy
Session Number: 2

## 2023-04-09 ENCOUNTER — Ambulatory Visit
Admission: RE | Admit: 2023-04-09 | Discharge: 2023-04-09 | Disposition: A | Discharge status: Home / Self Care | Payer: Medicare Other | Source: Ambulatory Visit | Attending: Radiation Oncology | Admitting: Radiation Oncology

## 2023-04-09 ENCOUNTER — Other Ambulatory Visit: Payer: Self-pay

## 2023-04-09 DIAGNOSIS — Z510 Encounter for antineoplastic radiation therapy: Secondary | ICD-10-CM | POA: Diagnosis not present

## 2023-04-09 LAB — RAD ONC ARIA SESSION SUMMARY
Course Elapsed Days: 3
Plan Fractions Treated to Date: 3
Plan Prescribed Dose Per Fraction: 5 Gy
Plan Total Fractions Prescribed: 10
Plan Total Prescribed Dose: 50 Gy
Reference Point Dosage Given to Date: 15 Gy
Reference Point Session Dosage Given: 5 Gy
Session Number: 3

## 2023-04-10 ENCOUNTER — Other Ambulatory Visit: Payer: Self-pay

## 2023-04-10 ENCOUNTER — Ambulatory Visit
Admission: RE | Admit: 2023-04-10 | Discharge: 2023-04-10 | Disposition: A | Discharge status: Home / Self Care | Payer: Medicare Other | Source: Ambulatory Visit | Attending: Radiation Oncology | Admitting: Radiation Oncology

## 2023-04-10 ENCOUNTER — Ambulatory Visit
Admission: RE | Admit: 2023-04-10 | Discharge: 2023-04-10 | Disposition: A | Discharge status: Home / Self Care | Payer: Medicare Other | Source: Ambulatory Visit | Attending: Radiation Oncology

## 2023-04-10 DIAGNOSIS — Z510 Encounter for antineoplastic radiation therapy: Secondary | ICD-10-CM | POA: Diagnosis not present

## 2023-04-10 LAB — RAD ONC ARIA SESSION SUMMARY
Course Elapsed Days: 4
Plan Fractions Treated to Date: 4
Plan Prescribed Dose Per Fraction: 5 Gy
Plan Total Fractions Prescribed: 10
Plan Total Prescribed Dose: 50 Gy
Reference Point Dosage Given to Date: 20 Gy
Reference Point Session Dosage Given: 5 Gy
Session Number: 4

## 2023-04-13 ENCOUNTER — Ambulatory Visit
Admission: RE | Admit: 2023-04-13 | Discharge: 2023-04-13 | Disposition: A | Discharge status: Home / Self Care | Payer: Medicare Other | Source: Ambulatory Visit | Attending: Radiation Oncology | Admitting: Radiation Oncology

## 2023-04-13 ENCOUNTER — Other Ambulatory Visit: Payer: Self-pay

## 2023-04-13 DIAGNOSIS — Z510 Encounter for antineoplastic radiation therapy: Secondary | ICD-10-CM | POA: Diagnosis not present

## 2023-04-13 LAB — RAD ONC ARIA SESSION SUMMARY
Course Elapsed Days: 7
Plan Fractions Treated to Date: 5
Plan Prescribed Dose Per Fraction: 5 Gy
Plan Total Fractions Prescribed: 10
Plan Total Prescribed Dose: 50 Gy
Reference Point Dosage Given to Date: 25 Gy
Reference Point Session Dosage Given: 5 Gy
Session Number: 5

## 2023-04-14 ENCOUNTER — Other Ambulatory Visit: Payer: Self-pay

## 2023-04-14 ENCOUNTER — Ambulatory Visit
Admission: RE | Admit: 2023-04-14 | Discharge: 2023-04-14 | Disposition: A | Discharge status: Home / Self Care | Payer: Medicare Other | Source: Ambulatory Visit | Attending: Radiation Oncology | Admitting: Radiation Oncology

## 2023-04-14 DIAGNOSIS — Z510 Encounter for antineoplastic radiation therapy: Secondary | ICD-10-CM | POA: Diagnosis not present

## 2023-04-14 LAB — RAD ONC ARIA SESSION SUMMARY
Course Elapsed Days: 8
Plan Fractions Treated to Date: 6
Plan Prescribed Dose Per Fraction: 5 Gy
Plan Total Fractions Prescribed: 10
Plan Total Prescribed Dose: 50 Gy
Reference Point Dosage Given to Date: 30 Gy
Reference Point Session Dosage Given: 5 Gy
Session Number: 6

## 2023-04-16 ENCOUNTER — Ambulatory Visit
Admission: RE | Admit: 2023-04-16 | Discharge: 2023-04-16 | Disposition: A | Discharge status: Home / Self Care | Payer: Medicare Other | Source: Ambulatory Visit | Attending: Radiation Oncology | Admitting: Radiation Oncology

## 2023-04-16 ENCOUNTER — Other Ambulatory Visit: Payer: Self-pay

## 2023-04-16 DIAGNOSIS — C3411 Malignant neoplasm of upper lobe, right bronchus or lung: Secondary | ICD-10-CM | POA: Insufficient documentation

## 2023-04-16 DIAGNOSIS — F17210 Nicotine dependence, cigarettes, uncomplicated: Secondary | ICD-10-CM | POA: Insufficient documentation

## 2023-04-16 DIAGNOSIS — Z510 Encounter for antineoplastic radiation therapy: Secondary | ICD-10-CM | POA: Insufficient documentation

## 2023-04-16 LAB — RAD ONC ARIA SESSION SUMMARY
Course Elapsed Days: 10
Plan Fractions Treated to Date: 7
Plan Prescribed Dose Per Fraction: 5 Gy
Plan Total Fractions Prescribed: 10
Plan Total Prescribed Dose: 50 Gy
Reference Point Dosage Given to Date: 35 Gy
Reference Point Session Dosage Given: 5 Gy
Session Number: 7

## 2023-04-17 ENCOUNTER — Ambulatory Visit
Admission: RE | Admit: 2023-04-17 | Discharge: 2023-04-17 | Disposition: A | Discharge status: Home / Self Care | Payer: Medicare Other | Source: Ambulatory Visit | Attending: Radiation Oncology

## 2023-04-17 ENCOUNTER — Other Ambulatory Visit: Payer: Self-pay

## 2023-04-17 DIAGNOSIS — Z510 Encounter for antineoplastic radiation therapy: Secondary | ICD-10-CM | POA: Diagnosis not present

## 2023-04-17 LAB — RAD ONC ARIA SESSION SUMMARY
Course Elapsed Days: 11
Plan Fractions Treated to Date: 8
Plan Prescribed Dose Per Fraction: 5 Gy
Plan Total Fractions Prescribed: 10
Plan Total Prescribed Dose: 50 Gy
Reference Point Dosage Given to Date: 40 Gy
Reference Point Session Dosage Given: 5 Gy
Session Number: 8

## 2023-04-20 ENCOUNTER — Other Ambulatory Visit: Payer: Self-pay

## 2023-04-20 ENCOUNTER — Ambulatory Visit
Admission: RE | Admit: 2023-04-20 | Discharge: 2023-04-20 | Disposition: A | Discharge status: Home / Self Care | Payer: Medicare Other | Source: Ambulatory Visit | Attending: Radiation Oncology | Admitting: Radiation Oncology

## 2023-04-20 DIAGNOSIS — Z510 Encounter for antineoplastic radiation therapy: Secondary | ICD-10-CM | POA: Diagnosis not present

## 2023-04-20 LAB — RAD ONC ARIA SESSION SUMMARY
Course Elapsed Days: 14
Plan Fractions Treated to Date: 9
Plan Prescribed Dose Per Fraction: 5 Gy
Plan Total Fractions Prescribed: 10
Plan Total Prescribed Dose: 50 Gy
Reference Point Dosage Given to Date: 45 Gy
Reference Point Session Dosage Given: 5 Gy
Session Number: 9

## 2023-04-21 ENCOUNTER — Other Ambulatory Visit: Payer: Self-pay

## 2023-04-21 ENCOUNTER — Ambulatory Visit
Admission: RE | Admit: 2023-04-21 | Discharge: 2023-04-21 | Disposition: A | Discharge status: Home / Self Care | Payer: Medicare Other | Source: Ambulatory Visit | Attending: Radiation Oncology | Admitting: Radiation Oncology

## 2023-04-21 DIAGNOSIS — Z510 Encounter for antineoplastic radiation therapy: Secondary | ICD-10-CM | POA: Diagnosis not present

## 2023-04-21 LAB — RAD ONC ARIA SESSION SUMMARY
Course Elapsed Days: 15
Plan Fractions Treated to Date: 10
Plan Prescribed Dose Per Fraction: 5 Gy
Plan Total Fractions Prescribed: 10
Plan Total Prescribed Dose: 50 Gy
Reference Point Dosage Given to Date: 50 Gy
Reference Point Session Dosage Given: 5 Gy
Session Number: 10

## 2023-04-22 NOTE — Radiation Completion Notes (Addendum)
  Radiation Oncology         [phone] ________________________________  Name: AMAAN MEYER MRN: [id number]  Date of Service: 04/21/2023  DOB: Nov 28, 1950  End of Treatment Note   Diagnosis:  Recurrent Stage IIIA, cT1bN2M0, mixed histology NSCLC and small cell carcinoma of the RUL and subcarinal nodal station.   Intent: Palliative     ==========DELIVERED PLANS==========  First Treatment Date: 2023-04-06 Last Treatment Date: 2023-04-21   Plan Name: Lung_R_UHRT Site: Lung, Right Technique: IMRT Mode: Photon Dose Per Fraction: 5 Gy Prescribed Dose (Delivered / Prescribed): 50 Gy / 50 Gy Prescribed Fxs (Delivered / Prescribed): 10 / 10     ==========ON TREATMENT VISIT DATES========== 2023-04-10, 2023-04-17   See weekly On Treatment Notes in Epic for details in the Media tab (listed as Progress notes on the On Treatment Visit Dates listed above). The patient tolerated radiation.    The patient will receive a call in about one month from the radiation oncology department. He will continue follow up with Dr. Sherrod as well. He will also be due for his surveillance MRI which we will follow up to discuss.     Donald KYM Husband, PAC

## 2023-04-29 ENCOUNTER — Ambulatory Visit (HOSPITAL_COMMUNITY)
Admission: RE | Admit: 2023-04-29 | Discharge: 2023-04-29 | Disposition: A | Discharge status: Home / Self Care | Payer: Medicare Other | Source: Ambulatory Visit | Attending: Radiation Oncology | Admitting: Radiation Oncology

## 2023-04-29 DIAGNOSIS — C7931 Secondary malignant neoplasm of brain: Secondary | ICD-10-CM | POA: Diagnosis present

## 2023-04-29 MED ORDER — GADOBUTROL 1 MMOL/ML IV SOLN
6.0000 mL | Freq: Once | INTRAVENOUS | Status: AC | PRN
  Administered 2023-04-29: 6 mL via INTRAVENOUS

## 2023-05-04 ENCOUNTER — Encounter: Payer: Self-pay | Admitting: Radiation Oncology

## 2023-05-04 ENCOUNTER — Telehealth: Payer: Self-pay | Admitting: Radiation Therapy

## 2023-05-04 ENCOUNTER — Ambulatory Visit
Admission: RE | Admit: 2023-05-04 | Discharge: 2023-05-04 | Disposition: A | Discharge status: Home / Self Care | Payer: Medicare Other | Source: Ambulatory Visit | Attending: Radiation Oncology | Admitting: Radiation Oncology

## 2023-05-04 ENCOUNTER — Other Ambulatory Visit: Payer: Self-pay | Admitting: Radiation Therapy

## 2023-05-04 ENCOUNTER — Inpatient Hospital Stay: Payer: Medicare Other | Attending: Radiation Oncology

## 2023-05-04 DIAGNOSIS — C642 Malignant neoplasm of left kidney, except renal pelvis: Secondary | ICD-10-CM

## 2023-05-04 DIAGNOSIS — C7931 Secondary malignant neoplasm of brain: Secondary | ICD-10-CM

## 2023-05-04 DIAGNOSIS — C3411 Malignant neoplasm of upper lobe, right bronchus or lung: Secondary | ICD-10-CM

## 2023-05-04 MED ORDER — METHYLPREDNISOLONE 4 MG PO TABS
ORAL_TABLET | ORAL | 0 refills | Status: DC
Start: 2023-05-04 — End: 2023-08-07

## 2023-05-04 NOTE — Telephone Encounter (Signed)
I spoke with Ian Hart about his short interval brain MRI scheduled on 2/12. He was thankful for the call and plans to attend.  Jalene Mullet R.T.(R)(T) Radiation Special Procedures Lead

## 2023-05-04 NOTE — Progress Notes (Signed)
  Radiation Oncology         [phone] ________________________________  Name: DASHEEM BRAFF MRN: [id number]  Date of Service: 04/21/2023  DOB: 1950/12/19  End of Treatment Note  Diagnosis: Stage IIIA, [id number], mixed histology NSCLC and small cell carcinoma of the RUL and subcarinal nodal station.   Intent: Curative  Treatment:  04/06/23-04/21/23 Ultrahypofractionated Radiation Plan Name: Lung_R_UHRT Site: Lung, RUL Technique: IMRT Mode: Photon Dose Per Fraction: 5 Gy Prescribed Dose (Delivered / Prescribed): 50 Gy / 50 Gy Prescribed Fxs (Delivered / Prescribed): 10 / 10   The patient tolerated radiation.    The patient will receive a call in about one month from the radiation oncology department. He will continue follow up with Dr. Arbutus Ped as well as with [name] in our brain oncology program     Osker Mason, [facility]

## 2023-05-04 NOTE — Progress Notes (Signed)
Telephone nursing appointment for review of most recent BRAIN-MRI. I verified patient's identity x2 and began nursing interview.   Patient reports doing well. Patient denies any other related issues at this time.   Meaningful use complete.   Patient aware of their 2pm-05/04/2023 telephone appointment w/ Laurence Aly PA-C. I left my extension [phone] in case patient needs anything. Patient verbalized understanding. This concludes the nursing interview.   Patient contact [phone]     Ruel Favors, LPN

## 2023-05-04 NOTE — Progress Notes (Signed)
Radiation Oncology         [phone] ________________________________   Outpatient Follow Up - Conducted via telephone at patient request.  I spoke with the patient to conduct this consult visit via telephone. The patient was notified in advance and was offered an in person or telemedicine meeting to allow for face to face communication but instead preferred to proceed with a telephone visit.    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 Diagnoses of Renal cell carcinoma of left kidney (HCC) and Metastasis to brain [facility]) were also pertinent to this visit.   HISTORY OF PRESENT ILLNESS: Ian Hart is a 73 y.o. 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 This was initially found on lung cancer screening scan, and he subsequently underwent a PET scan on 03/01/2020 which revealed hypermetabolism within the right upper lobe nodule as well as an isolated hypermetabolic subcarinal lymph nodes suspicious for nodal disease.  No other extrathoracic hypermetabolic metastases were identified, a left renal lesion which was indeterminate but hypermetabolic was also seen and possibly suspicious for renal cell carcinoma.  He underwent a CT super D scan on 03/22/2020 and subsequent bronchoscopy.  Interestingly bronchoscopy confirmed adenocarcinoma with in the lung nodule and small cell carcinoma within the 7 station lymph node.  He proceeded with chemoRT between 04/17/20 and 06/01/20. He also proceeded with prophylactic cranial irradiation to reduce risks of brain disease. 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  He was offered evaluation to ENT for mastoid effusion, but ultimately his hearing improved and he did not feel that he needed the appointment. A repeat MRI on 11/11/22 that showed punctate foci of enhancement in the left frontal lobe and left cerebellum but it was felt these changes were vascular in nature and to closely follow with repeat MRI for any interval change. This repeat MRI on 01/12/23 showed an increase in the two lesions and 3 additional lesions so he proceeded with SRS to 5 PTV targets. He completed this in October 2024. He was followed by medical oncology and had suspicious findings in the RUL and he was offered reirradiaiton with ultrahypofractionated treatment and this was completed a few weeks ago. He's contacted to review his first post SRS MRI which was performed on 04/29/23. This showed resolution of previously treated disease but about 7 new punctate lesions throughout the brain. He does have fluid in his  left greater than right mastoid region again. He has continued in surveillance otherwise with Dr. Arbutus Ped, and is contacted today to discuss his MRI.  On review of systems, the patient states he is doing well but continues to have hearing difficulties. He describes the left side feeling full and muffled hearing at times on this side more than the right. No  headaches, movement, or visual changes  are noted at this time. No shortness of breath or chest pain, or other symptoms of pain are noted. No other complaints are verbalized.   PREVIOUS RADIATION THERAPY:   04/06/23-04/21/23 Ultrahypofractionated Radiation Plan Name: Lung_R_UHRT Site: Lung, RUL Technique: IMRT Mode: Photon Dose Per  Fraction: 5 Gy Prescribed Dose (Delivered / Prescribed): 50 Gy / 50 Gy Prescribed Fxs (Delivered / Prescribed): 10 / 10   01/27/23-01/30/23: SRS Treatment      Plan Name: Brain_4_dca Site: Brain   PTV_4_PontineR_16mm Technique: SBRT/SRT-3D Mode: Photon Dose Per Fraction:  16 Gy Prescribed Dose (Delivered / Prescribed): 16 Gy / 16 Gy Prescribed Fxs (Delivered / Prescribed): 1 / 1   Plan Name: Brain_5_dca Site: Brain PTV_1_FrontalL_2.51mm PTV__2_ParietalL_2.82mm PTV_3_FrontalL_2.37mm PTV_5_CerebellarL_1.55mm Technique: SBRT/SRT-3D Mode: Photon Dose Per Fraction: 20 Gy Prescribed Dose (Delivered / Prescribed): 20 Gy / 20 Gy Prescribed Fxs (Delivered / Prescribed): 1 / 1   08/20/2020 through 08/31/2020 Site Technique Total Dose (Gy) Dose per Fx (Gy) Completed Fx Beam Energies  Brain: Brain Complex 25/25 2.5 10/10 6X     Radiation Treatment Dates: 04/17/2020 through 06/01/2020 Site Technique Total Dose (Gy) Dose per Fx (Gy) Completed Fx Beam Energies  Lung, Right: Lung_Rt 3D 60/60 2 30/30 6X, 10X  Lung, Right: Lung_Rt_Bst 3D 6/6 2 3/3 6X, 10X     PAST MEDICAL HISTORY:  Past Medical History:  Diagnosis Date   History of chicken pox    History of shingles 04/2010   Lung cancer (HCC) 02/2020       PAST SURGICAL HISTORY: Past Surgical History:  Procedure Laterality Date   BRONCHIAL BIOPSY  03/22/2020   Procedure: BRONCHIAL BIOPSIES;  Surgeon: Josephine Igo, DO;  Location: [facility];  Service: Pulmonary;;   BRONCHIAL BRUSHINGS  03/22/2020   Procedure: BRONCHIAL BRUSHINGS;  Surgeon: Josephine Igo, DO;  Location: [facility];  Service: Pulmonary;;   BRONCHIAL NEEDLE ASPIRATION BIOPSY  03/22/2020   Procedure: BRONCHIAL NEEDLE ASPIRATION BIOPSIES;  Surgeon: Josephine Igo, DO;  Location: [facility];  Service: Pulmonary;;   BRONCHIAL WASHINGS  03/22/2020   Procedure: BRONCHIAL WASHINGS;  Surgeon: Josephine Igo, DO;  Location: [facility];  Service: Pulmonary;;   FIDUCIAL MARKER PLACEMENT  03/22/2020   Procedure: FIDUCIAL MARKER PLACEMENT;  Surgeon: Josephine Igo, DO;  Location: [facility];  Service: Pulmonary;;   NO PAST SURGERIES     ROBOTIC ASSITED PARTIAL NEPHRECTOMY Left 2022   VIDEO BRONCHOSCOPY WITH ENDOBRONCHIAL NAVIGATION  N/A 03/22/2020   Procedure: VIDEO BRONCHOSCOPY WITH ENDOBRONCHIAL NAVIGATION;  Surgeon: Josephine Igo, DO;  Location: [facility];  Service: Pulmonary;  Laterality: N/A;   VIDEO BRONCHOSCOPY WITH ENDOBRONCHIAL ULTRASOUND N/A 03/22/2020   Procedure: VIDEO BRONCHOSCOPY WITH ENDOBRONCHIAL ULTRASOUND;  Surgeon: Josephine Igo, DO;  Location: [facility];  Service: Pulmonary;  Laterality: N/A;     FAMILY HISTORY:  Family History  Problem Relation Age of Onset   Sudden death Mother    Rheumatic fever Mother    COPD Brother    Liver disease Neg Hx    Colon cancer Neg Hx    Esophageal cancer Neg Hx      SOCIAL HISTORY:  reports that he has been smoking cigarettes. He has a 100 pack-year smoking history. He has never used smokeless tobacco. He reports current alcohol use of about 5.0 standard drinks of alcohol per week. He reports that he does not use drugs. The patient is married and lives in [place]. 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    ALLERGIES: Acyclovir and related   MEDICATIONS:  Current Outpatient Medications  Medication Sig Dispense Refill   methylPREDNISolone (MEDROL) 4 MG tablet Take 6 tabs po day 1, 5 tabs po day 2, 4 tabs po day 3, 3 tabs po day 4, 2  tabs po day 5, 1 tab po day 6 then stop 21 tablet 0   Multiple Vitamin (MULTIVITAMIN) tablet Take 1 tablet by mouth daily.     tamsulosin (FLOMAX) 0.4 MG CAPS capsule Take 0.4 mg by mouth daily.     Varenicline Tartrate, Starter, (CHANTIX STARTING MONTH PAK) 0.5 MG X 11 & 1 MG X 42 TBPK Take one 0.5 mg tablet by mouth once daily for 3 days, then increase to one 0.5 mg tablet twice daily for 4 days, then increase to one 1 mg tablet twice daily. 53 each 0   Zoster Vaccine Adjuvanted [facility]) injection 0.1ml IM now and again in 2-6 months 0.5 mL 1   No current facility-administered medications for this encounter.     PHYSICAL EXAM:  Unable to assess due to encounter type.  ECOG = 0  0 -  Asymptomatic (Fully active, able to carry on all predisease activities without restriction)  1 - Symptomatic but completely ambulatory (Restricted in physically strenuous activity but ambulatory and able to carry out work of a light or sedentary nature. For example, light housework, office work)  2 - Symptomatic, <50% in bed during the day (Ambulatory and capable of all self care but unable to carry out any work activities. Up and about more than 50% of waking hours)  3 - Symptomatic, >50% in bed, but not bedbound (Capable of only limited self-care, confined to bed or chair 50% or more of waking hours)  4 - Bedbound (Completely disabled. Cannot carry on any self-care. Totally confined to bed or chair)  5 - Death   Santiago Glad MM, Creech RH, Tormey DC, et al. [id number]). "Toxicity and response criteria of the [facility]". Am. Evlyn Clines. Oncol. 5 (6): 649-55    LABORATORY DATA:  Lab Results  Component Value Date   WBC 5.8 02/12/2023   HGB 12.9 (L) 02/12/2023   HCT 37.0 (L) 02/12/2023   MCV 93.9 02/12/2023   PLT 180 02/12/2023   Lab Results  Component Value Date   NA 128 (L) 02/12/2023   K 4.1 02/12/2023   CL 95 (L) 02/12/2023   CO2 28 02/12/2023   Lab Results  Component Value Date   ALT 12 02/12/2023   AST 17 02/12/2023   ALKPHOS 71 02/12/2023   BILITOT 0.6 02/12/2023      RADIOGRAPHY: MR Brain W Wo Contrast Result Date: 04/29/2023 CLINICAL DATA:  Brain metastases, assess treatment response EXAM: MRI HEAD WITHOUT AND WITH CONTRAST TECHNIQUE: Multiplanar, multiecho pulse sequences of the brain and surrounding structures were obtained without and with intravenous contrast. CONTRAST:  6mL GADAVIST GADOBUTROL 1 MMOL/ML IV SOLN COMPARISON:  01/12/2023 FINDINGS: Brain: Previously noted enhancing lesions are no longer seen; however, there are several new punctate foci of enhancement in the right frontal lobe (series 1100, image 224), anterior superior left frontal lobe  (series 1100, image 214), posterior left frontal lobe (series 1100, image 195), anterior inferior left frontal lobe (series 1100, image 180), right occipital lobe (series 1100, image 158), posterior right temporal lobe (series 1100, image 134), and left cerebellum (series 1100, image 95). These are not associated with significant edema. No acute infarct, hemorrhage, mass, mass effect, or midline shift. No hydrocephalus or extra-axial collection. Pituitary and craniocervical junction within normal limits. Vascular: Normal arterial flow voids. Skull and upper cervical spine: Normal marrow signal. Sinuses/Orbits: Clear paranasal sinuses. No acute finding in the orbits. Other: Fluid in the left-greater-than-right mastoid air cells. IMPRESSION: Previously noted enhancing lesions are  no longer seen; however, there are several new punctate enhancing foci in the bilateral cerebral hemispheres and left cerebellum, concerning for new metastatic disease. Electronically Signed   By: Wiliam Ke M.D.   On: 04/29/2023 16:46        IMPRESSION/PLAN: 1. Stage IIIA, [id number], mixed histology NSCLC and small cell carcinoma of the RUL and subcarinal nodal station.  His case was discussed in multidisciplinary brain oncology conference and recommendations were made for repeat MRI brain in about a month. If persistence was noted of the findings, then consideration of another course of SRS treatment has been discussed. He is in agreement with this plan. 2. 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 He continues to follow up with [facility] in [facility] in surveillance. We will follow this expectantly. 3. Bilateral mastoid effusions with hearing loss. His symptoms improved after medrol dosepak in March 2024. We will retreat him with this regimen and encouraged him to also have exam with PCP to evaluate for cerumen impaction as well  This encounter was conducted via telephone.  The  patient has provided two factor identification and has given verbal consent for this type of encounter and has been advised to only accept a meeting of this type in a secure network environment. The time spent during this encounter was 45 minutes including preparation, discussion, and coordination of the patient's care. The attendants for this meeting include Ronny Bacon  and [name]. During the encounter, Ronny Bacon was located remotely. Ian Hart was located at home.    Osker Mason, PAC

## 2023-05-12 ENCOUNTER — Ambulatory Visit (INDEPENDENT_AMBULATORY_CARE_PROVIDER_SITE_OTHER): Payer: Medicare Other

## 2023-05-12 VITALS — Ht 73.2 in | Wt 136.0 lb

## 2023-05-12 DIAGNOSIS — Z0000 Encounter for general adult medical examination without abnormal findings: Secondary | ICD-10-CM

## 2023-05-12 NOTE — Patient Instructions (Addendum)
Ian Hart , Thank you for taking time to come for your Medicare Wellness Visit. I appreciate your ongoing commitment to your health goals. Please review the following plan we discussed and let me know if I can assist you in the future.   Referrals/Orders/Follow-Ups/Clinician Recommendations:   This is a list of the screening recommended for you and due dates:  Health Maintenance  Topic Date Due   Zoster (Shingles) Vaccine (1 of 2) [birth year]06/1969   DTaP/Tdap/Td vaccine (2 - Td or Tdap) 11/09/2021   COVID-19 Vaccine (6 - 2024-25 season) 12/14/2022   Medicare Annual Wellness Visit  05/11/2024   Cologuard (Stool DNA test)  06/05/2025   Pneumonia Vaccine  Completed   Flu Shot  Completed   HPV Vaccine  Aged Out   Colon Cancer Screening  Discontinued   Hepatitis C Screening  Discontinued    Advanced directives: (Copy Requested) Please bring a copy of your health care power of attorney and living will to the office to be added to your chart at your convenience.  Next Medicare Annual Wellness Visit scheduled for next year: Yes

## 2023-05-12 NOTE — Progress Notes (Signed)
Subjective:   Ian Hart is a 73 y.o. male who presents for Medicare Annual/Subsequent preventive examination.  Visit Complete: Virtual I connected with  Ian Hart on 05/12/23 by a video and audio enabled telemedicine application and verified that I am speaking with the correct person using two identifiers.  Patient Location: Home  Provider Location: Home Office  I discussed the limitations of evaluation and management by telemedicine. The patient expressed understanding and agreed to proceed.  Vital Signs: Because this visit was a virtual/telehealth visit, some criteria may be missing or patient reported. Any vitals not documented were not able to be obtained and vitals that have been documented are patient reported.    Cardiac Risk Factors include: advanced age (>87men, >25 women);male gender     Objective:    Today's Vitals   05/12/23 1052 05/12/23 1053  Weight: 136 lb (61.7 kg)   Height: 6' 1.2" (1.859 m)   PainSc:  0-No pain   Body mass index is 17.85 kg/m.     05/12/2023   10:58 AM 05/04/2023    9:54 AM 03/19/2023    7:36 AM 11/17/2022   10:37 AM 05/12/2022   11:50 AM 04/22/2022    8:23 AM 11/04/2021    9:36 AM  Advanced Directives  Does Patient Have a Medical Advance Directive? Yes Yes Yes Yes Yes Yes Yes  Type of Estate agent of [place];Living will   Living will;Healthcare Power of Attorney Living will;Healthcare Power of [employer] Power of [place];Living will Living will;Healthcare Power of Attorney  Does patient want to make changes to medical advance directive?      No - Patient declined   Copy of Healthcare Power of Attorney in Chart? No - copy requested     No - copy requested     Current Medications (verified) Outpatient Encounter Medications as of 05/12/2023  Medication Sig   methylPREDNISolone (MEDROL) 4 MG tablet Take 6 tabs po day 1, 5 tabs po day 2, 4 tabs po day 3, 3 tabs po day 4, 2 tabs po day 5, 1 tab po day  6 then stop   Multiple Vitamin (MULTIVITAMIN) tablet Take 1 tablet by mouth daily.   tamsulosin (FLOMAX) 0.4 MG CAPS capsule Take 0.4 mg by mouth daily.   Varenicline Tartrate, Starter, (CHANTIX STARTING MONTH PAK) 0.5 MG X 11 & 1 MG X 42 TBPK Take one 0.5 mg tablet by mouth once daily for 3 days, then increase to one 0.5 mg tablet twice daily for 4 days, then increase to one 1 mg tablet twice daily.   Zoster Vaccine Adjuvanted [facility]) injection 0.94ml IM now and again in 2-6 months   No facility-administered encounter medications on file as of 05/12/2023.    Allergies (verified) Acyclovir and related   History: Past Medical History:  Diagnosis Date   History of chicken pox    History of shingles 04/2010   Lung cancer (HCC) 02/2020   Past Surgical History:  Procedure Laterality Date   BRONCHIAL BIOPSY  03/22/2020   Procedure: BRONCHIAL BIOPSIES;  Surgeon: Josephine Igo, DO;  Location: [facility];  Service: Pulmonary;;   BRONCHIAL BRUSHINGS  03/22/2020   Procedure: BRONCHIAL BRUSHINGS;  Surgeon: Josephine Igo, DO;  Location: [facility];  Service: Pulmonary;;   BRONCHIAL NEEDLE ASPIRATION BIOPSY  03/22/2020   Procedure: BRONCHIAL NEEDLE ASPIRATION BIOPSIES;  Surgeon: Josephine Igo, DO;  Location: [facility];  Service: Pulmonary;;   BRONCHIAL WASHINGS  03/22/2020   Procedure:  BRONCHIAL WASHINGS;  Surgeon: Josephine Igo, DO;  Location: [facility];  Service: Pulmonary;;   FIDUCIAL MARKER PLACEMENT  03/22/2020   Procedure: FIDUCIAL MARKER PLACEMENT;  Surgeon: Josephine Igo, DO;  Location: [facility];  Service: Pulmonary;;   NO PAST SURGERIES     ROBOTIC ASSITED PARTIAL NEPHRECTOMY Left 2022   VIDEO BRONCHOSCOPY WITH ENDOBRONCHIAL NAVIGATION N/A 03/22/2020   Procedure: VIDEO BRONCHOSCOPY WITH ENDOBRONCHIAL NAVIGATION;  Surgeon: Josephine Igo, DO;  Location: [facility];  Service: Pulmonary;  Laterality: N/A;   VIDEO BRONCHOSCOPY WITH ENDOBRONCHIAL ULTRASOUND  N/A 03/22/2020   Procedure: VIDEO BRONCHOSCOPY WITH ENDOBRONCHIAL ULTRASOUND;  Surgeon: Josephine Igo, DO;  Location: [facility];  Service: Pulmonary;  Laterality: N/A;   Family History  Problem Relation Age of Onset   Sudden death Mother    Rheumatic fever Mother    COPD Brother    Liver disease Neg Hx    Colon cancer Neg Hx    Esophageal cancer Neg Hx    Social History   Socioeconomic History   Marital status: Married    Spouse name: Not on file   Number of children: 1   Years of education: Not on file   Highest education level: Not on file  Occupational History   Occupation: retired  Tobacco Use   Smoking status: Every Day    Current packs/day: 2.00    Average packs/day: 2.0 packs/day for 50.0 years (100.0 ttl pk-yrs)    Types: Cigarettes   Smokeless tobacco: Never   Tobacco comments:    07/25/20  Vaping Use   Vaping status: Never Used  Substance and Sexual Activity   Alcohol use: Yes    Alcohol/week: 5.0 standard drinks of alcohol    Types: 3 Cans of beer, 2 Shots of liquor per week    Comment: daily   Drug use: No   Sexual activity: Yes  Other Topics Concern   Not on file  Social History Narrative   Regular exercise:  2-3 x weekly (sports, yardwork)   Caffeine use:  3 cups coffee daily   18 yr old son   Wife   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           Social Drivers of Corporate investment banker Strain: Low Risk  (05/12/2023)   Overall Financial Resource Strain (CARDIA)    Difficulty of Paying Living Expenses: Not hard at all  Food Insecurity: No Food Insecurity (05/12/2023)   Hunger Vital Sign    Worried About Running Out of Food in the Last Year: Never true    Ran Out of Food in the Last Year: Never true  Transportation Needs: No Transportation Needs (05/12/2023)   PRAPARE - Administrator, Civil Service (Medical): No    Lack of Transportation (Non-Medical): No  Physical Activity:  Inactive (05/12/2023)   Exercise Vital Sign    Days of Exercise per Week: 0 days    Minutes of Exercise per Session: 0 min  Stress: No Stress Concern Present (05/12/2023)   [employer] of Occupational Health - Occupational Stress Questionnaire    Feeling of Stress : Not at all  Social Connections: Socially Integrated (05/12/2023)   Social Connection and Isolation Panel [NHANES]    Frequency of Communication with Friends and Family: More than three times a week    Frequency of Social Gatherings with Friends and Family: More than three times a week  Attends Religious Services: More than 4 times per year    Active Member of Clubs or Organizations: Yes    Attends Banker Meetings: More than 4 times per year    Marital Status: Married    Tobacco Counseling Ready to quit: Not Answered Counseling given: Not Answered Tobacco comments: 07/25/20   Clinical Intake:  Pre-visit preparation completed: Yes  Pain : No/denies pain Pain Score: 0-No pain     BMI - recorded: 17.85 Nutritional Status: BMI <19  Underweight Nutritional Risks: None Diabetes: No  How often do you need to have someone help you when you read instructions, pamphlets, or other written materials from your doctor or pharmacy?: 1 - Never  Interpreter Needed?: No  Information entered by :: Theresa Mulligan LPN   Activities of Daily Living    05/12/2023   10:57 AM  In your present state of health, do you have any difficulty performing the following activities:  Hearing? 0  Vision? 0  Difficulty concentrating or making decisions? 0  Walking or climbing stairs? 0  Dressing or bathing? 0  Doing errands, shopping? 0  Preparing Food and eating ? N  Using the Toilet? N  In the past six months, have you accidently leaked urine? N  Do you have problems with loss of bowel control? N  Managing your Medications? N  Managing your Finances? N  Housekeeping or managing your Housekeeping? N    Patient  Care Team: [name], NP as PCP - General (Internal Medicine)  Indicate any recent Medical Services you may have received from other than [facility] providers in the past year (date may be approximate).     Assessment:   This is a routine wellness examination for Ian Hart.  Hearing/Vision screen Hearing Screening - Comments:: Denies hearing difficulties   Vision Screening - Comments:: Wears rx glasses - up to date with routine eye exams with  Deferred   Goals Addressed               This Visit's Progress     Increase physical activity (pt-stated)        Gain Weight       Depression Screen    05/12/2023   10:57 AM 07/30/2022    9:53 AM 04/22/2022    8:26 AM 04/19/2021    8:30 AM 02/10/2020    8:47 AM  PHQ 2/9 Scores  PHQ - 2 Score 0 1 0 0 1  PHQ- 9 Score  1   7    Fall Risk    05/12/2023   10:58 AM 07/30/2022    9:53 AM 04/22/2022    8:24 AM 04/19/2021    8:28 AM  Fall Risk   Falls in the past year? 0 0 0 0  Number falls in past yr: 0 0 0 0  Injury with Fall? 0 0 0 0  Risk for fall due to : No Fall Risks No Fall Risks No Fall Risks   Follow up Falls prevention discussed Falls evaluation completed Falls evaluation completed Falls prevention discussed    MEDICARE RISK AT HOME: Medicare Risk at Home Any stairs in or around the home?: No If so, are there any without handrails?: No Home free of loose throw rugs in walkways, pet beds, electrical cords, etc?: Yes Adequate lighting in your home to reduce risk of falls?: Yes Life alert?: No Use of a cane, walker or w/c?: No Grab bars in the bathroom?: No Shower chair or bench in shower?: No  Elevated toilet seat or a handicapped toilet?: No  TIMED UP AND GO:  Was the test performed?  No    Cognitive Function:        05/12/2023   10:59 AM 04/22/2022    8:34 AM  6CIT Screen  What Year? 0 points 0 points  What month? 0 points 0 points  What time? 0 points 0 points  Count back from 20 0 points 0 points  Months in  reverse 0 points 0 points  Repeat phrase 0 points 2 points  Total Score 0 points 2 points    Immunizations Immunization History  Administered Date(s) Administered   Fluad Quad(high Dose 65+) 01/27/2020, 04/29/2022   Influenza, High Dose Seasonal PF 01/29/2021, 02/28/2022   Influenza,inj,Quad PF,6+ Mos 02/04/2013, 01/02/2014   Influenza-Unspecified 01/13/2023   PFIZER(Purple Top)SARS-COV-2 Vaccination 06/06/2019, 06/27/2019, 03/09/2020, 09/12/2020   PNEUMOCOCCAL CONJUGATE-20 04/29/2022   Pfizer Covid-19 Vaccine Bivalent Booster 35yrs & up 02/06/2021   Pneumococcal Polysaccharide-23 02/10/2020, 01/29/2021   Tdap 11/10/2011   Zoster, Live 12/11/2011    TDAP status: Due, Education has been provided regarding the importance of this vaccine. Advised may receive this vaccine at local pharmacy or Health Dept. Aware to provide a copy of the vaccination record if obtained from local pharmacy or Health Dept. Verbalized acceptance and understanding.  Flu Vaccine status: Up to date  Pneumococcal vaccine status: Up to date  Covid-19 vaccine status: Declined, Education has been provided regarding the importance of this vaccine but patient still declined. Advised may receive this vaccine at local pharmacy or Health Dept.or vaccine clinic. Aware to provide a copy of the vaccination record if obtained from local pharmacy or Health Dept. Verbalized acceptance and understanding.  Qualifies for Shingles Vaccine? Yes   Zostavax completed No   Shingrix Completed?: No.    Education has been provided regarding the importance of this vaccine. Patient has been advised to call insurance company to determine out of pocket expense if they have not yet received this vaccine. Advised may also receive vaccine at local pharmacy or Health Dept. Verbalized acceptance and understanding.  Screening Tests Health Maintenance  Topic Date Due   Zoster Vaccines- Shingrix (1 of 2) 06/06/1969   DTaP/Tdap/Td (2 - Td or Tdap)  11/09/2021   COVID-19 Vaccine (6 - 2024-25 season) 12/14/2022   Medicare Annual Wellness (AWV)  05/11/2024   Fecal DNA (Cologuard)  06/05/2025   Pneumonia Vaccine 75+ Years old  Completed   INFLUENZA VACCINE  Completed   HPV VACCINES  Aged Out   Colonoscopy  Discontinued   Hepatitis C Screening  Discontinued    Health Maintenance  Health Maintenance Due  Topic Date Due   Zoster Vaccines- Shingrix (1 of 2) [birth year]06/1969   DTaP/Tdap/Td (2 - Td or Tdap) 11/09/2021   COVID-19 Vaccine (6 - 2024-25 season) 12/14/2022        Additional Screening:    Vision Screening: Recommended annual ophthalmology exams for early detection of glaucoma and other disorders of the eye. Is the patient up to date with their annual eye exam?  Yes  Who is the provider or what is the name of the office in which the patient attends annual eye exams? Deferred If pt is not established with a provider, would they like to be referred to a provider to establish care? No .   Dental Screening: Recommended annual dental exams for proper oral hygiene    Community Resource Referral / Chronic Care Management:   CRR required this visit?  No  CCM required this visit?  No     Plan:     I have personally reviewed and noted the following in the patient's chart:   Medical and social history Use of alcohol, tobacco or illicit drugs  Current medications and supplements including opioid prescriptions. Patient is not currently taking opioid prescriptions. Functional ability and status Nutritional status Physical activity Advanced directives List of other physicians Hospitalizations, surgeries, and ER visits in previous 12 months Vitals Screenings to include cognitive, depression, and falls Referrals and appointments  In addition, I have reviewed and discussed with patient certain preventive protocols, quality metrics, and best practice recommendations. A written personalized care plan for preventive  services as well as general preventive health recommendations were provided to patient.     Tillie Rung, LPN   07/21/8117   After Visit Summary: (MyChart) Due to this being a telephonic visit, the after visit summary with patients personalized plan was offered to patient via MyChart   Nurse Notes: None

## 2023-05-27 ENCOUNTER — Ambulatory Visit (HOSPITAL_COMMUNITY)
Admission: RE | Admit: 2023-05-27 | Discharge: 2023-05-27 | Disposition: A | Discharge status: Home / Self Care | Payer: Medicare Other | Source: Ambulatory Visit | Attending: Radiation Oncology | Admitting: Radiation Oncology

## 2023-05-27 DIAGNOSIS — C7931 Secondary malignant neoplasm of brain: Secondary | ICD-10-CM | POA: Insufficient documentation

## 2023-05-27 MED ORDER — GADOBUTROL 1 MMOL/ML IV SOLN
6.0000 mL | Freq: Once | INTRAVENOUS | Status: AC | PRN
  Administered 2023-05-27: 6 mL via INTRAVENOUS

## 2023-06-01 ENCOUNTER — Inpatient Hospital Stay: Payer: Medicare Other | Attending: Radiation Oncology

## 2023-06-01 DIAGNOSIS — C7931 Secondary malignant neoplasm of brain: Secondary | ICD-10-CM | POA: Insufficient documentation

## 2023-06-01 DIAGNOSIS — F17210 Nicotine dependence, cigarettes, uncomplicated: Secondary | ICD-10-CM | POA: Insufficient documentation

## 2023-06-01 DIAGNOSIS — C3411 Malignant neoplasm of upper lobe, right bronchus or lung: Secondary | ICD-10-CM | POA: Insufficient documentation

## 2023-06-02 ENCOUNTER — Ambulatory Visit
Admission: RE | Admit: 2023-06-02 | Discharge: 2023-06-02 | Disposition: A | Discharge status: Home / Self Care | Payer: Medicare Other | Source: Ambulatory Visit | Attending: Radiation Oncology | Admitting: Radiation Oncology

## 2023-06-02 ENCOUNTER — Encounter: Payer: Self-pay | Admitting: Radiation Oncology

## 2023-06-02 DIAGNOSIS — C642 Malignant neoplasm of left kidney, except renal pelvis: Secondary | ICD-10-CM

## 2023-06-02 DIAGNOSIS — C3411 Malignant neoplasm of upper lobe, right bronchus or lung: Secondary | ICD-10-CM

## 2023-06-02 DIAGNOSIS — C7931 Secondary malignant neoplasm of brain: Secondary | ICD-10-CM

## 2023-06-02 MED ORDER — LORAZEPAM 0.5 MG PO TABS: ORAL_TABLET | ORAL | 0 refills | Status: AC

## 2023-06-02 NOTE — Progress Notes (Signed)
 Radiation Oncology         ([phone]) [phone] ________________________________   Outpatient Follow Up - Conducted via telephone at patient request.  I spoke with the patient to conduct this consult visit via telephone. The patient was notified in advance and was offered an in person or telemedicine meeting to allow for face to face communication but instead preferred to proceed with a telephone visit.    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 Diagnoses of Renal cell carcinoma of left kidney (HCC) and Metastasis to brain [facility]) were also pertinent to this visit.   HISTORY OF PRESENT ILLNESS: Ian Hart is a 73 y.o. 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 This was initially found on lung cancer screening scan, and he subsequently underwent a PET scan on 03/01/2020 which revealed hypermetabolism within the right upper lobe nodule as well as an isolated hypermetabolic subcarinal lymph nodes suspicious for nodal disease.  No other extrathoracic hypermetabolic metastases were identified, a left renal lesion which was indeterminate but hypermetabolic was also seen and possibly suspicious for renal cell carcinoma.  He underwent a CT super D scan on 03/22/2020 and subsequent bronchoscopy.  Interestingly bronchoscopy confirmed adenocarcinoma with in the lung nodule and small cell carcinoma within the 7 station lymph node.  He proceeded with chemoRT between 04/17/20 and 06/01/20. He also proceeded with prophylactic cranial irradiation to reduce risks of brain disease. 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  He was offered evaluation to ENT for mastoid effusion, but ultimately his hearing improved and he did not feel that he needed the appointment. A repeat MRI on 11/11/22 that showed punctate foci of enhancement in the left frontal lobe and left cerebellum but it was felt these changes were vascular in nature and to closely follow with repeat MRI for any interval change. This repeat MRI on 01/12/23 showed an increase in the two lesions and 3 additional lesions so he proceeded with SRS to 5 PTV targets. He completed this in October 2024. He was followed by medical oncology and had suspicious findings in the RUL and he was offered reirradiaiton with ultrahypofractionated treatment and this was completed on 04/21/23.   His first post SRS MRI brain on 04/29/23 showed resolution of previously treated disease but about 7 new punctate lesions throughout the brain. There was fluid in his left greater than right mastoid region again, and he was treated with a steroid taper. His case had been reviewed in brain oncology conference, and it was felt that the punctate findings were not certain, so a repeat MRI was recommended a month later. His most recent MRI brain on 05/27/23 showed two new lesions in the left cerebellum, each 2 mm in size, and a possible new lesion in the left cerebellum versus a vascular change. 10 stable lesions were noted ranging in size from 1.5 mm-3 mm, and 4 larger lesions including 2 mm in the right cerebellum, 3 mm in the posterolateral right temporal lobe, 3 mm in the posteriomedial right parietal lobe, and a 2-3 mm right superior frontal gyrus.  His mastoid effusion is small to moderate. His scans have been fused with his prior treatment plan, and essentially there may be up to 16 lesions that have not been treated. He's contacted to review this today.  On review of systems, the patient reports he is doing well overall. He feels like the steroid dose pack I gave him helped his hearing  improve. He denies any new symptoms of headaches or neurologic changes. No new shortness of breath or chest pain are noted. No other complaints are verbalized.  PREVIOUS RADIATION THERAPY:   04/06/23-04/21/23 Ultrahypofractionated Radiation Plan Name: Lung_R_UHRT Site: Lung, RUL Technique: IMRT Mode: Photon Dose Per Fraction: 5 Gy Prescribed Dose (Delivered / Prescribed): 50 Gy / 50 Gy Prescribed Fxs (Delivered / Prescribed): 10 / 10   01/27/23-01/30/23: SRS Treatment      Plan Name: Brain_4_dca Site: Brain   PTV_4_PontineR_108mm Technique: SBRT/SRT-3D Mode: Photon Dose Per Fraction: 16 Gy Prescribed Dose (Delivered / Prescribed): 16 Gy / 16 Gy Prescribed Fxs (Delivered / Prescribed): 1 / 1   Plan Name: Brain_5_dca Site: Brain PTV_1_FrontalL_2.23mm PTV__2_ParietalL_2.64mm PTV_3_FrontalL_2.50mm PTV_5_CerebellarL_1.59mm Technique: SBRT/SRT-3D Mode: Photon Dose Per Fraction: 20 Gy Prescribed Dose (Delivered / Prescribed): 20 Gy / 20 Gy Prescribed Fxs (Delivered / Prescribed): 1 / 1   08/20/2020 through 08/31/2020 Site Technique Total Dose (Gy) Dose per Fx (Gy) Completed Fx Beam Energies  Brain: Brain Complex 25/25 2.5 10/10 6X     Radiation Treatment Dates: 04/17/2020 through 06/01/2020 Site Technique Total Dose (Gy) Dose per Fx (Gy) Completed Fx Beam Energies  Lung, Right: Lung_Rt 3D 60/60 2 30/30 6X, 10X  Lung, Right: Lung_Rt_Bst 3D 6/6 2 3/3 6X, 10X     PAST MEDICAL HISTORY:  Past Medical History:  Diagnosis Date   History of chicken pox    History of shingles 04/2010   Lung cancer (HCC) 02/2020       PAST SURGICAL HISTORY: Past Surgical History:  Procedure Laterality Date   BRONCHIAL BIOPSY  03/22/2020   Procedure: BRONCHIAL BIOPSIES;  Surgeon: Josephine Igo, DO;  Location: [facility];  Service: Pulmonary;;   BRONCHIAL BRUSHINGS  03/22/2020   Procedure: BRONCHIAL BRUSHINGS;  Surgeon: Josephine Igo, DO;  Location: [facility];  Service: Pulmonary;;    BRONCHIAL NEEDLE ASPIRATION BIOPSY  03/22/2020   Procedure: BRONCHIAL NEEDLE ASPIRATION BIOPSIES;  Surgeon: Josephine Igo, DO;  Location: [facility];  Service: Pulmonary;;   BRONCHIAL WASHINGS  03/22/2020   Procedure: BRONCHIAL WASHINGS;  Surgeon: Josephine Igo, DO;  Location: [facility];  Service: Pulmonary;;   FIDUCIAL MARKER PLACEMENT  03/22/2020   Procedure: FIDUCIAL MARKER PLACEMENT;  Surgeon: Josephine Igo, DO;  Location: [facility];  Service: Pulmonary;;   NO PAST SURGERIES     ROBOTIC ASSITED PARTIAL NEPHRECTOMY Left 2022   VIDEO BRONCHOSCOPY WITH ENDOBRONCHIAL NAVIGATION N/A 03/22/2020   Procedure: VIDEO BRONCHOSCOPY WITH ENDOBRONCHIAL NAVIGATION;  Surgeon: Josephine Igo, DO;  Location: [facility];  Service: Pulmonary;  Laterality: N/A;   VIDEO BRONCHOSCOPY WITH ENDOBRONCHIAL ULTRASOUND N/A 03/22/2020   Procedure: VIDEO BRONCHOSCOPY WITH ENDOBRONCHIAL ULTRASOUND;  Surgeon: Josephine Igo, DO;  Location: [facility];  Service: Pulmonary;  Laterality: N/A;     FAMILY HISTORY:  Family History  Problem Relation Age of Onset   Sudden death Mother    Rheumatic fever Mother    COPD Brother    Liver disease Neg Hx    Colon cancer Neg Hx    Esophageal cancer Neg Hx      SOCIAL HISTORY:  reports that he has been smoking cigarettes. He has a 100 pack-year smoking history. He has never used smokeless tobacco. He reports current alcohol use of about 5.0 standard drinks of alcohol per week. He reports that he does not use drugs. The patient is married and lives in [place]. 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    ALLERGIES: Acyclovir and related   MEDICATIONS:  Current Outpatient Medications  Medication Sig Dispense Refill   methylPREDNISolone (MEDROL) 4 MG tablet Take 6 tabs po day 1, 5 tabs po day 2, 4 tabs po day 3, 3 tabs po day 4, 2 tabs po day 5, 1 tab po day 6 then stop 21 tablet 0   Multiple Vitamin (MULTIVITAMIN) tablet Take  1 tablet by mouth daily.     tamsulosin (FLOMAX) 0.4 MG CAPS capsule Take 0.4 mg by mouth daily.     Varenicline Tartrate, Starter, (CHANTIX STARTING MONTH PAK) 0.5 MG X 11 & 1 MG X 42 TBPK Take one 0.5 mg tablet by mouth once daily for 3 days, then increase to one 0.5 mg tablet twice daily for 4 days, then increase to one 1 mg tablet twice daily. 53 each 0   Zoster Vaccine Adjuvanted [facility]) injection 0.70ml IM now and again in 2-6 months 0.5 mL 1   No current facility-administered medications for this visit.     PHYSICAL EXAM:  Unable to assess due to encounter type.  ECOG = 1  0 - Asymptomatic (Fully active, able to carry on all predisease activities without restriction)  1 - Symptomatic but completely ambulatory (Restricted in physically strenuous activity but ambulatory and able to carry out work of a light or sedentary nature. For example, light housework, office work)  2 - Symptomatic, <50% in bed during the day (Ambulatory and capable of all self care but unable to carry out any work activities. Up and about more than 50% of waking hours)  3 - Symptomatic, >50% in bed, but not bedbound (Capable of only limited self-care, confined to bed or chair 50% or more of waking hours)  4 - Bedbound (Completely disabled. Cannot carry on any self-care. Totally confined to bed or chair)  5 - Death   Santiago Glad MM, Creech RH, Tormey DC, et al. [phone]). "Toxicity and response criteria of the [facility]". Am. Evlyn Clines. Oncol. 5 (6): 649-55    LABORATORY DATA:  Lab Results  Component Value Date   WBC 5.8 02/12/2023   HGB 12.9 (L) 02/12/2023   HCT 37.0 (L) 02/12/2023   MCV 93.9 02/12/2023   PLT 180 02/12/2023   Lab Results  Component Value Date   NA 128 (L) 02/12/2023   K 4.1 02/12/2023   CL 95 (L) 02/12/2023   CO2 28 02/12/2023   Lab Results  Component Value Date   ALT 12 02/12/2023   AST 17 02/12/2023   ALKPHOS 71 02/12/2023   BILITOT 0.6 02/12/2023       RADIOGRAPHY: MR Brain W Wo Contrast Result Date: 05/29/2023 CLINICAL DATA:  Brain metastases, assess treatment response. Short interval scan. History of small cell lung cancer with brain metastases. History of prophylactic cranial irradiation in 2022 and subsequent SRS in 01/2023. EXAM: MRI HEAD WITHOUT AND WITH CONTRAST TECHNIQUE: Multiplanar, multiecho pulse sequences of the brain and surrounding structures were obtained without and with intravenous contrast. CONTRAST:  6mL GADAVIST GADOBUTROL 1 MMOL/ML IV SOLN COMPARISON:  Head MRI 04/29/2023 FINDINGS: Brain: New lesions: 1. 2 mm  in the posterior left cerebellar hemisphere (series 1100, image 129). 2. 2 mm in the lateral left cerebellar hemisphere (series 1100, image 131). 3. Possible new 2 mm lesion versus vascular enhancement in the left cerebellar hemisphere (series 1100, image 139). Larger lesions: 1. 2 mm in the lateral right cerebellar hemisphere (series 1100, image 114, previously 1 mm). 2. 3 mm lesion in the posterolateral right temporal lobe (series 1100, image 157, previously 1-2 mm). 3. 3 mm in the posteromedial right parietal lobe (series 1100, image 203, previously 2 mm). 4. 2-3 mm in the right superior frontal gyrus (series 1100, image 218, previously 1-2 mm). Stable lesions: 1. 2 mm in the left cerebellar hemisphere (series 1100, image 119). 2. 2 mm in the medial left cerebellar hemisphere (series 1100, image 127). 3. 3 mm in the anteroinferior left frontal lobe (series 1100, image 166). 4. 2 mm anterior to the atrium of the right lateral ventricle (series 1100, image 174). 5. 1.5 mm in the anterior left frontal lobe (series 1100, image 193). 6. 2 mm in the lateral left frontoparietal region (series 1100, image 202). 7. 2-3 mm in the left frontal lobe (series 1100, image 206). 8. 2 mm in the left frontoparietal periventricular white matter (series 1100, image 210). 9. 2 mm in the posterior left frontal periventricular white matter (series  1100, image 216). 10. 1.5 mm in the left parietal lobe (series 1100, image 215). Some of the above described stable lesions are more conspicuous on the current study but in retrospect were present on the prior MRI and have not significantly changed in size. Other brain findings: There is no significant edema associated with any of the above described lesions. Scattered chronic microhemorrhages are again seen in both cerebral hemispheres, brainstem, and cerebellum. No acute infarct, midline shift, or extra-axial fluid collection is evident. There is mild cerebral atrophy. Periventricular white matter T2 hyperintensities are unchanged and nonspecific but may reflect the sequelae of mild chronic small vessel ischemic disease and/or post treatment changes. Vascular: Major intracranial vascular flow voids are preserved. Skull and upper cervical spine: No suspicious marrow lesion. Sinuses/Orbits: Unremarkable orbits. Clear paranasal sinuses. Trace right and small to moderate left mastoid effusions. Other: None. IMPRESSION: 1. Two or three new punctate left cerebellar metastases. 2. Slightly increased size of 4 metastases which remain 3 mm or less in size. 3. 10 additional punctate metastases which are unchanged in size though overall more conspicuous on the current study. Electronically Signed   By: Sebastian Ache M.D.   On: 05/29/2023 13:10        IMPRESSION/PLAN: 1. Recurrent Metastatic Stage IIIA, [id number], mixed histology NSCLC and small cell carcinoma of the RUL and subcarinal nodal station.  We discussed the MRI findings and between 9-10 lesions called on his MRI by radiology with the possibility of several additional seen by our physics team. We will coordinate with neurosurgery, and the patient is interested as well in discussion in their clinic as well. Dr. Mitzi Hansen would offer stereotactic radiosurgery to the 9-10 + areas that have not been treated previously.  We discussed the risks, benefits, short, and long  term effects of radiotherapy, as well as the curative intent, and the patient is interested in proceeding with single fraction radiosurgery. He will be contacted by our special procedures navigator to coordinate simulation and subsequent treatment. He will sign consent at the time of simulation. He will also proceed with restaging scan later this week and follow up with Dr. Arbutus Ped. 2. 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 He continues to follow up with [facility] in surveillance. We will follow this expectantly. 3. Bilateral mastoid effusions with hearing loss. Again,his symptoms improved after steroid dose pack. We will follow this closely in the future. 4. Claustrophobia. The patient is interested in medication to improve his tolerance to wearing the radiation treatment mask, and for future MRIs. We reviewed the side effect profile and will send a new prescription for Ativan to his pharmacy.  This encounter was conducted via telephone.  The patient has provided two factor identification and has given verbal consent for this type of encounter and has been advised to only accept a meeting of this type in a secure network environment. The time spent during this encounter was 35 minutes including preparation, discussion, and coordination of the patient's care. The attendants for this meeting include Ronny Bacon  and [name]. During the encounter, Ronny Bacon was located at [facility] in Radiation Oncology. Ian Hart was located at home.    Osker Mason, PAC

## 2023-06-02 NOTE — Progress Notes (Signed)
 Telephone nursing appointment for review of most recent MRI scan. I verified patient's identity x2 and began nursing interview.   Patient reports doing well. Patient denies any related issues at this time.   Meaningful use complete.   Patient aware of their 8:30am-06/02/23 telephone appointment w/ Laurence Aly PA-C. I left my extension [phone] in case patient needs anything. Patient verbalized understanding. This concludes the nursing interview.   Patient contact [phone]     Ruel Favors, LPN

## 2023-06-04 ENCOUNTER — Ambulatory Visit (HOSPITAL_COMMUNITY)
Admission: RE | Admit: 2023-06-04 | Discharge: 2023-06-04 | Disposition: A | Discharge status: Home / Self Care | Payer: Medicare Other | Source: Ambulatory Visit | Attending: Physician Assistant | Admitting: Physician Assistant

## 2023-06-04 ENCOUNTER — Inpatient Hospital Stay: Payer: Medicare Other | Attending: Radiation Oncology

## 2023-06-04 ENCOUNTER — Other Ambulatory Visit: Payer: Medicare Other

## 2023-06-04 DIAGNOSIS — C3411 Malignant neoplasm of upper lobe, right bronchus or lung: Secondary | ICD-10-CM | POA: Insufficient documentation

## 2023-06-04 DIAGNOSIS — C7931 Secondary malignant neoplasm of brain: Secondary | ICD-10-CM | POA: Diagnosis present

## 2023-06-04 DIAGNOSIS — F17210 Nicotine dependence, cigarettes, uncomplicated: Secondary | ICD-10-CM | POA: Diagnosis not present

## 2023-06-04 LAB — CBC WITH DIFFERENTIAL (CANCER CENTER ONLY)
Abs Immature Granulocytes: 0.02 10*3/uL (ref 0.00–0.07)
Basophils Absolute: 0.1 10*3/uL (ref 0.0–0.1)
Basophils Relative: 1 %
Eosinophils Absolute: 0.1 10*3/uL (ref 0.0–0.5)
Eosinophils Relative: 2 %
HCT: 38.6 % — ABNORMAL LOW (ref 39.0–52.0)
Hemoglobin: 12.9 g/dL — ABNORMAL LOW (ref 13.0–17.0)
Immature Granulocytes: 0 %
Lymphocytes Relative: 12 %
Lymphs Abs: 0.7 10*3/uL (ref 0.7–4.0)
MCH: 31.7 pg (ref 26.0–34.0)
MCHC: 33.4 g/dL (ref 30.0–36.0)
MCV: 94.8 fL (ref 80.0–100.0)
Monocytes Absolute: 0.6 10*3/uL (ref 0.1–1.0)
Monocytes Relative: 10 %
Neutro Abs: 4.9 10*3/uL (ref 1.7–7.7)
Neutrophils Relative %: 75 %
Platelet Count: 208 10*3/uL (ref 150–400)
RBC: 4.07 MIL/uL — ABNORMAL LOW (ref 4.22–5.81)
RDW: 12.2 % (ref 11.5–15.5)
WBC Count: 6.4 10*3/uL (ref 4.0–10.5)
nRBC: 0 % (ref 0.0–0.2)

## 2023-06-04 LAB — CMP (CANCER CENTER ONLY)
ALT: 11 U/L (ref 0–44)
AST: 19 U/L (ref 15–41)
Albumin: 4.5 g/dL (ref 3.5–5.0)
Alkaline Phosphatase: 70 U/L (ref 38–126)
Anion gap: 7 (ref 5–15)
BUN: 13 mg/dL (ref 8–23)
CO2: 28 mmol/L (ref 22–32)
Calcium: 9.3 mg/dL (ref 8.9–10.3)
Chloride: 99 mmol/L (ref 98–111)
Creatinine: 1.15 mg/dL (ref 0.61–1.24)
GFR, Estimated: >60 mL/min (ref >60)
Glucose, Bld: 118 mg/dL — ABNORMAL HIGH (ref 70–99)
Potassium: 4.7 mmol/L (ref 3.5–5.1)
Sodium: 134 mmol/L — ABNORMAL LOW (ref 135–145)
Total Bilirubin: 0.6 mg/dL (ref 0.0–1.2)
Total Protein: 7.1 g/dL (ref 6.5–8.1)

## 2023-06-04 MED ORDER — IOHEXOL 300 MG/ML  SOLN
75.0000 mL | Freq: Once | INTRAMUSCULAR | Status: AC | PRN
  Administered 2023-06-04: 75 mL via INTRAVENOUS

## 2023-06-04 NOTE — Progress Notes (Signed)
 Has armband been applied?  {yes no:314532}  Does patient have an allergy to IV contrast dye?: {yes no:314532}   Has patient ever received premedication for IV contrast dye?: {yes no:314532}   Does patient take metformin?: {yes no:314532}  If patient does take metformin when was the last dose: {Time; dates multiple:15870}  Date of lab work: 06/04/2023 BUN: 13 CR: 1.15 eGfr: >60  IV site: {iv locations:314275}  Has IV site been added to flowsheet?  {yes no:314532}  There were no vitals taken for this visit.

## 2023-06-05 ENCOUNTER — Ambulatory Visit
Admission: RE | Admit: 2023-06-05 | Discharge: 2023-06-05 | Disposition: A | Discharge status: Home / Self Care | Payer: Medicare Other | Source: Ambulatory Visit | Attending: Radiation Oncology | Admitting: Radiation Oncology

## 2023-06-05 VITALS — BP 137/66 | HR 97 | Temp 96.9°F | Resp 18 | Ht 73.0 in | Wt 138.4 lb

## 2023-06-05 DIAGNOSIS — F17210 Nicotine dependence, cigarettes, uncomplicated: Secondary | ICD-10-CM | POA: Diagnosis not present

## 2023-06-05 DIAGNOSIS — C7931 Secondary malignant neoplasm of brain: Secondary | ICD-10-CM

## 2023-06-05 DIAGNOSIS — C3411 Malignant neoplasm of upper lobe, right bronchus or lung: Secondary | ICD-10-CM | POA: Insufficient documentation

## 2023-06-05 DIAGNOSIS — Z510 Encounter for antineoplastic radiation therapy: Secondary | ICD-10-CM | POA: Diagnosis present

## 2023-06-10 DIAGNOSIS — Z510 Encounter for antineoplastic radiation therapy: Secondary | ICD-10-CM | POA: Diagnosis not present

## 2023-06-11 ENCOUNTER — Ambulatory Visit
Admission: RE | Admit: 2023-06-11 | Discharge: 2023-06-11 | Disposition: A | Discharge status: Home / Self Care | Payer: Medicare Other | Source: Ambulatory Visit | Attending: Radiation Oncology | Admitting: Radiation Oncology

## 2023-06-11 ENCOUNTER — Other Ambulatory Visit: Payer: Self-pay

## 2023-06-11 ENCOUNTER — Ambulatory Visit: Payer: Medicare Other | Admitting: Internal Medicine

## 2023-06-11 DIAGNOSIS — Z510 Encounter for antineoplastic radiation therapy: Secondary | ICD-10-CM | POA: Diagnosis not present

## 2023-06-11 LAB — RAD ONC ARIA SESSION SUMMARY
Course Elapsed Days: 0
Plan Fractions Treated to Date: 1
Plan Prescribed Dose Per Fraction: 20 Gy
Plan Total Fractions Prescribed: 1
Plan Total Prescribed Dose: 20 Gy
Reference Point Dosage Given to Date: 20 Gy
Reference Point Session Dosage Given: 20 Gy
Session Number: 1

## 2023-06-11 NOTE — Progress Notes (Signed)
 Ian Hart rested with [name] for 30 minutes following his SRS treatment.  Patient denies headache, dizziness, nausea, diplopia or ringing in the ears. Denies fatigue. Patient without complaints. Understands to avoid strenuous activity for the next 24 hours and call [phone] with needs.   BP (!) 143/72 (BP Location: Left Arm)   Pulse 74   Temp (!) 96.9 F (36.1 C) (Temporal)   Resp 18   SpO2 100%

## 2023-06-12 NOTE — Radiation Completion Notes (Addendum)
  Radiation Oncology         [phone]) [phone] ________________________________  Name: Ian Hart MRN: [id number]  Date of Service: 06/11/2023  DOB: 1950/11/25  End of Treatment Note    Diagnosis: Recurrent Metastatic Stage IIIA, cT1bN2M0, mixed histology NSCLC and small cell carcinoma of the RUL and subcarinal nodal station   Intent: Palliative     ==========DELIVERED PLANS==========  06/11/23 SRS Treatment Plan Name: Brain_SRS Site: Brain PTV_6_ne1CerebellumL_post6mm PTV_7_ne2CerebellumL_22mm PTV_8_ne3CerebellumL_sup20mm PTV_9_lar1CerebellumR_8mm PTV_10_lar2TemporalR_post4mm PTV_11_lar3ParietalR_27mm PTV_12_lar4FrontalR_44mm PTV_13_sta1CerebellumL_inf58mm PTV_14_sta2CerebellumL_62mm PTV_15_sta3FrontalL_AntInf_53mm  PTV_16_sta4VentricleR_Ant_62mm PTV_17_sta5FrontalL_Ant_57mm PTV_18_sta6FrontoParL_49mm  PTV_19_sta7FrontalL_Ant_73mm PTV_20_sta9FrontalL_80mm  PTV_21_sta10ParietalL_22mm  Technique: SBRT/SRT-IMRT Mode: Photon Dose Per Fraction: 20 Gy Prescribed Dose (Delivered / Prescribed): 20 Gy / 20 Gy Prescribed Fxs (Delivered / Prescribed): 1 / 1     ==========ON TREATMENT VISIT DATES========== 2023-06-11    See weekly On Treatment Notes in Epic for details in the Media tab (listed as Progress notes on the On Treatment Visit Dates listed above). The patient tolerated radiation. He developed fatigue and anticipated skin changes in the treatment field.   The patient will receive a call in about one month from the radiation oncology department. He will continue follow up with Dr. Arbutus Ped and  as well.      Osker Mason, PAC

## 2023-06-15 ENCOUNTER — Inpatient Hospital Stay: Payer: Medicare Other | Attending: Radiation Oncology | Admitting: Internal Medicine

## 2023-06-15 VITALS — BP 137/68 | HR 79 | Temp 99.2°F | Resp 17 | Wt 138.7 lb

## 2023-06-15 DIAGNOSIS — C649 Malignant neoplasm of unspecified kidney, except renal pelvis: Secondary | ICD-10-CM | POA: Diagnosis not present

## 2023-06-15 DIAGNOSIS — C3490 Malignant neoplasm of unspecified part of unspecified bronchus or lung: Secondary | ICD-10-CM | POA: Diagnosis not present

## 2023-06-15 DIAGNOSIS — F17210 Nicotine dependence, cigarettes, uncomplicated: Secondary | ICD-10-CM | POA: Diagnosis not present

## 2023-06-15 DIAGNOSIS — C7931 Secondary malignant neoplasm of brain: Secondary | ICD-10-CM | POA: Diagnosis present

## 2023-06-15 DIAGNOSIS — C3411 Malignant neoplasm of upper lobe, right bronchus or lung: Secondary | ICD-10-CM | POA: Diagnosis present

## 2023-06-15 NOTE — Progress Notes (Signed)
 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 3) Suspicious left renal lesion   PRIOR THERAPY: Systemic chemotherapy/radiation with cisplatin 80 mg per metered squared on day 1 and etoposide 100 mg per metered squared on days 1, 2, and 3 IV every 3 weeks.  Status post 4 cycles.  Starting from cycle #3, cisplatin was changed to carboplatin for an AUC of 5 due to renal insufficiency. First dose on 04/20/20. Onpro was added to the treatment plan starting from cycle #3 due to pancytopenia.     CURRENT THERAPY:  Observation  INTERVAL HISTORY: Ian Hart 73 y.o. male returns to the clinic today for follow-up visit accompanied by his wife.Discussed the use of AI scribe software for clinical note transcription with the patient, who gave verbal consent to proceed.  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  He experiences occasional unsteadiness on his feet, which he attributes to a lack of exercise. This symptom has been present for some time and is not new since the diagnosis of brain metastases. He is under regular surveillance with MRI scans of the brain due to the metastases. No nausea, vomiting, diarrhea, headaches, changes in vision, chest pain, shortness of breath, or cough.  He has a history of right hilar lymph node enlargement, which has been treated with radiation. Recent imaging shows that the lymph node has decreased in size. There are no new concerning spots noted on the scan.  His past medical history includes early-stage papillary  renal cell carcinoma, for which he is followed by a kidney specialist annually. His next appointment is scheduled for May, where a scan will be performed to monitor the condition.        MEDICAL HISTORY: Past Medical History:  Diagnosis Date   History of chicken pox    History of shingles 04/2010   Lung cancer (HCC) 02/2020    ALLERGIES:  is allergic to acyclovir and related.  MEDICATIONS:  Current Outpatient Medications  Medication Sig Dispense Refill   LORazepam (ATIVAN) 0.5 MG tablet 1 tab po 30 minutes prior to radiation or MRI scans 10 tablet 0   methylPREDNISolone (MEDROL) 4 MG tablet Take 6 tabs po day 1, 5 tabs po day 2, 4 tabs po day 3, 3 tabs po day 4, 2 tabs po day 5, 1 tab po day 6 then stop 21 tablet 0   Multiple Vitamin (MULTIVITAMIN) tablet Take 1 tablet by mouth daily.     tamsulosin (FLOMAX) 0.4 MG CAPS capsule Take 0.4 mg by mouth daily.     Varenicline Tartrate, Starter, (CHANTIX STARTING MONTH PAK) 0.5 MG X 11 & 1 MG X 42 TBPK Take one 0.5 mg tablet by mouth once daily for 3 days, then increase to one 0.5 mg tablet twice daily for 4 days, then increase to one 1 mg tablet twice daily. 53 each 0   Zoster Vaccine Adjuvanted Ian Hart) injection 0.14ml IM now and again in 2-6 months 0.5 mL 1   No current facility-administered medications for this visit.    SURGICAL HISTORY:  Past Surgical History:  Procedure Laterality Date   BRONCHIAL BIOPSY  03/22/2020   Procedure: BRONCHIAL BIOPSIES;  Surgeon: Ian Igo, DO;  Location: [facility];  Service: Pulmonary;;   BRONCHIAL BRUSHINGS  03/22/2020   Procedure: BRONCHIAL BRUSHINGS;  Surgeon: Ian Igo, DO;  Location: [facility];  Service: Pulmonary;;   BRONCHIAL NEEDLE ASPIRATION BIOPSY  03/22/2020   Procedure: BRONCHIAL NEEDLE ASPIRATION BIOPSIES;  Surgeon: Ian Igo, DO;  Location: [facility];  Service: Pulmonary;;   BRONCHIAL WASHINGS  03/22/2020   Procedure: BRONCHIAL WASHINGS;  Surgeon:  Ian Igo, DO;  Location: [facility];  Service: Pulmonary;;   FIDUCIAL MARKER PLACEMENT  03/22/2020   Procedure: FIDUCIAL MARKER PLACEMENT;  Surgeon: Ian Igo, DO;  Location: [facility];  Service: Pulmonary;;   NO PAST SURGERIES     ROBOTIC ASSITED PARTIAL NEPHRECTOMY Left 2022   VIDEO BRONCHOSCOPY WITH ENDOBRONCHIAL NAVIGATION N/A 03/22/2020   Procedure: VIDEO BRONCHOSCOPY WITH ENDOBRONCHIAL NAVIGATION;  Surgeon: Ian Igo, DO;  Location: [facility];  Service: Pulmonary;  Laterality: N/A;   VIDEO BRONCHOSCOPY WITH ENDOBRONCHIAL ULTRASOUND N/A 03/22/2020   Procedure: VIDEO BRONCHOSCOPY WITH ENDOBRONCHIAL ULTRASOUND;  Surgeon: Ian Igo, DO;  Location: [facility];  Service: Pulmonary;  Laterality: N/A;    REVIEW OF SYSTEMS:  Constitutional: positive for fatigue Eyes: negative Ears, nose, mouth, throat, and face: negative Respiratory: negative Cardiovascular: negative Gastrointestinal: negative Genitourinary:negative Integument/breast: negative Hematologic/lymphatic: negative Musculoskeletal:negative Neurological: negative Behavioral/Psych: negative Endocrine: negative Allergic/Immunologic: negative   PHYSICAL EXAMINATION: General appearance: alert, cooperative, and no distress Head: Normocephalic, without obvious abnormality, atraumatic Neck: no adenopathy, no JVD, supple, symmetrical, trachea midline, and thyroid not enlarged, symmetric, no tenderness/mass/nodules Lymph nodes: Cervical, supraclavicular, and axillary nodes normal. Resp: clear to auscultation bilaterally Back: symmetric, no curvature. ROM normal. No CVA tenderness. Cardio: regular rate and rhythm, S1, S2 normal, no murmur, click, rub or gallop GI: soft, non-tender; bowel sounds normal; no masses,  no organomegaly Extremities: extremities normal, atraumatic, no cyanosis or edema Neurologic: Alert and oriented X 3, normal strength and tone. Normal symmetric reflexes. Normal  coordination and gait  ECOG PERFORMANCE STATUS: 1 - Symptomatic but completely ambulatory  Blood pressure 137/68, pulse 79, temperature 99.2 F (37.3 C), temperature source Temporal, resp. rate 17, weight 138 lb 11.2 oz (62.9 kg), SpO2 100%.  LABORATORY DATA: Lab Results  Component Value Date   WBC 6.4 06/04/2023   HGB 12.9 (L) 06/04/2023   HCT 38.6 (L) 06/04/2023   MCV 94.8 06/04/2023   PLT 208 06/04/2023      Chemistry      Component Value Date/Time   NA 134 (L) 06/04/2023 0930   K 4.7 06/04/2023 0930   CL 99 06/04/2023 0930   CO2 28 06/04/2023 0930   BUN 13 06/04/2023 0930   CREATININE 1.15 06/04/2023 0930   CREATININE 0.86 11/12/2011 0917      Component Value Date/Time   CALCIUM 9.3 06/04/2023 0930   ALKPHOS 70 06/04/2023 0930   AST 19 06/04/2023 0930   ALT 11 06/04/2023 0930   BILITOT 0.6 06/04/2023 0930       RADIOGRAPHIC STUDIES: CT Chest W Contrast Result Date: 06/13/2023 CLINICAL DATA:  Restaging small cell lung cancer. * Tracking Code: BO * EXAM: CT CHEST WITH CONTRAST TECHNIQUE: Multidetector CT imaging of the chest was performed during intravenous contrast administration. RADIATION DOSE REDUCTION: This exam was performed according to the departmental dose-optimization program which includes automated exposure control, adjustment of the mA and/or kV according to  patient size and/or use of iterative reconstruction technique. CONTRAST:  75mL OMNIPAQUE IOHEXOL 300 MG/ML  SOLN COMPARISON:  Multiple prior imaging studies. The most recent is a PET-CT 02/20/2023 FINDINGS: Cardiovascular: The heart is normal in size. No pericardial effusion. The aorta is normal in caliber. Stable scattered atherosclerotic calcifications. No dissection. The branch vessels are patent. Mediastinum/Nodes: 9 mm right hilar node previously measured 12 mm on the PET-CT. It was hypermetabolic on that study. Smaller scattered mediastinal and hilar lymph nodes are stable. No new or progressive  findings. The esophagus is unremarkable. Lungs/Pleura: Stable post treatment changes in the medial right upper lobe surrounding the fiducials. The small nodule is stable measuring 5 mm. No new pulmonary lesions or pulmonary nodules. No acute pulmonary process. Stable significant underlying emphysematous lung disease. Upper Abdomen: No significant upper abdominal findings. No hepatic or adrenal gland lesions. Musculoskeletal: No significant bony findings. IMPRESSION: 1. Slight interval decrease in size of the right hilar lymph node. No new or progressive findings. 2. Stable post treatment changes involving the right upper lobe. No findings to suggest recurrent tumor. 3. No new pulmonary lesions or pulmonary nodules. 4. Stable severe underlying emphysematous lung disease. Electronically Signed   By: Rudie Meyer M.D.   On: 06/13/2023 10:28   MR Brain W Wo Contrast Result Date: 05/29/2023 CLINICAL DATA:  Brain metastases, assess treatment response. Short interval scan. History of small cell lung cancer with brain metastases. History of prophylactic cranial irradiation in 2022 and subsequent SRS in 01/2023. EXAM: MRI HEAD WITHOUT AND WITH CONTRAST TECHNIQUE: Multiplanar, multiecho pulse sequences of the brain and surrounding structures were obtained without and with intravenous contrast. CONTRAST:  6mL GADAVIST GADOBUTROL 1 MMOL/ML IV SOLN COMPARISON:  Head MRI 04/29/2023 FINDINGS: Brain: New lesions: 1. 2 mm in the posterior left cerebellar hemisphere (series 1100, image 129). 2. 2 mm in the lateral left cerebellar hemisphere (series 1100, image 131). 3. Possible new 2 mm lesion versus vascular enhancement in the left cerebellar hemisphere (series 1100, image 139). Larger lesions: 1. 2 mm in the lateral right cerebellar hemisphere (series 1100, image 114, previously 1 mm). 2. 3 mm lesion in the posterolateral right temporal lobe (series 1100, image 157, previously 1-2 mm). 3. 3 mm in the posteromedial right  parietal lobe (series 1100, image 203, previously 2 mm). 4. 2-3 mm in the right superior frontal gyrus (series 1100, image 218, previously 1-2 mm). Stable lesions: 1. 2 mm in the left cerebellar hemisphere (series 1100, image 119). 2. 2 mm in the medial left cerebellar hemisphere (series 1100, image 127). 3. 3 mm in the anteroinferior left frontal lobe (series 1100, image 166). 4. 2 mm anterior to the atrium of the right lateral ventricle (series 1100, image 174). 5. 1.5 mm in the anterior left frontal lobe (series 1100, image 193). 6. 2 mm in the lateral left frontoparietal region (series 1100, image 202). 7. 2-3 mm in the left frontal lobe (series 1100, image 206). 8. 2 mm in the left frontoparietal periventricular white matter (series 1100, image 210). 9. 2 mm in the posterior left frontal periventricular white matter (series 1100, image 216). 10. 1.5 mm in the left parietal lobe (series 1100, image 215). Some of the above described stable lesions are more conspicuous on the current study but in retrospect were present on the prior MRI and have not significantly changed in size. Other brain findings: There is no significant edema associated with any of the above described lesions. Scattered chronic microhemorrhages are again seen  in both cerebral hemispheres, brainstem, and cerebellum. No acute infarct, midline shift, or extra-axial fluid collection is evident. There is mild cerebral atrophy. Periventricular white matter T2 hyperintensities are unchanged and nonspecific but may reflect the sequelae of mild chronic small vessel ischemic disease and/or post treatment changes. Vascular: Major intracranial vascular flow voids are preserved. Skull and upper cervical spine: No suspicious marrow lesion. Sinuses/Orbits: Unremarkable orbits. Clear paranasal sinuses. Trace right and small to moderate left mastoid effusions. Other: None. IMPRESSION: 1. Two or three new punctate left cerebellar metastases. 2. Slightly  increased size of 4 metastases which remain 3 mm or less in size. 3. 10 additional punctate metastases which are unchanged in size though overall more conspicuous on the current study. Electronically Signed   By: Sebastian Ache M.D.   On: 05/29/2023 13:10    ASSESSMENT AND PLAN: This is a very pleasant 73 years old white male diagnosed with poorly differentiated carcinoma consistent with adenocarcinoma in the right upper lobe.  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  The patient also has a suspicious left renal lesion concerning for renal cell carcinoma. He is status post a course of systemic chemotherapy initially with cisplatin and etoposide but the cisplatin was discontinued secondary to renal insufficiency and the patient continued 3 more cycles of his systemic chemotherapy with carboplatin and etoposide concurrent with radiation.   He had evidence for disease recurrence with metastatic brain lesions in addition to right hilar lymphadenopathy treated with SRS as well as palliative radiotherapy to the right hilar area. The patient had repeat CT scan of the chest performed recently.  His scan showed improvement of his disease in the right hilar area.    Small Cell Lung Cancer with Brain Metastasis Small cell lung cancer with brain metastasis. Right hilar lymph node decreased in size post-radiation, indicating a positive response. Reports unsteadiness likely due to brain metastasis. No new symptoms such as nausea, vomiting, diarrhea, headache, vision changes, chest pain, or dyspnea. Regular MRI surveillance by Dr. Mitzi Hansen. Discussed Keytruda, but it is not approved for small cell lung cancer. If progression occurs, will consider chemotherapy and another immune therapy similar to [facility]. - Continue regular brain MRI - Schedule full scan of chest, abdomen, and pelvis - Follow up in six months  Papillary Renal Cell Carcinoma (Early Stage) Early-stage  papillary renal cell carcinoma. Followed annually by a kidney specialist at [facility]. Next follow-up in May for a scan to monitor the condition. - Continue annual follow-up with kidney specialist - Perform scan in May to monitor renal cell carcinoma  General Health Maintenance Encouraged regular exercise to improve stability and overall health. - Encourage regular exercise.   The patient was advised to call immediately if he has any concerning symptoms in the interval. The patient voices understanding of current disease status and treatment options and is in agreement with the current care plan.  All questions were answered. The patient knows to call the clinic with any problems, questions or concerns. We can certainly see the patient much sooner if necessary.   Disclaimer: This note was dictated with voice recognition software. Similar sounding words can inadvertently be transcribed and may not be corrected upon review.

## 2023-06-16 ENCOUNTER — Telehealth: Payer: Self-pay | Admitting: Internal Medicine

## 2023-06-16 NOTE — Telephone Encounter (Signed)
 Scheduled appointments per 3/3 LOS notes. The patient is aware of the made appointments.

## 2023-07-06 NOTE — Progress Notes (Signed)
  Radiation Oncology         [phone]) [phone] ________________________________  Name: Ian Hart MRN: [id number]  Date of Service: 07/06/2023  DOB: 27-Mar-1951  Post Treatment Telephone Note  Diagnosis:  Recurrent Metastatic Stage IIIA, [id number], mixed histology NSCLC and small cell carcinoma of the RUL and subcarinal nodal station (as documented in provider EOT note)  The patient was available for call today.  The patient did note fatigue during radiation but has recovered well. The patient did not note hair loss or skin changes in the field of radiation during therapy. The patient is not taking dexamethasone. The patient does not have symptoms of  weakness or loss of control of the extremities. The patient does not have symptoms of headache. The patient does not have symptoms of seizure or uncontrolled movement. The patient does not have symptoms of changes in vision. The patient does not have changes in speech. The patient does not have confusion.   The patient was counseled that he  will be contacted by our brain and spine navigator to schedule surveillance imaging. The patient was encouraged to call if he  have not received a call to schedule imaging, or if he develops concerns or questions regarding radiation. The patient will also continue to follow up with Dr. Arbutus Ped in medical oncology.   This concludes the interaction.  Ruel Favors, LPN

## 2023-07-13 ENCOUNTER — Ambulatory Visit
Admission: RE | Admit: 2023-07-13 | Discharge: 2023-07-13 | Disposition: A | Discharge status: Home / Self Care | Payer: Medicare Other | Source: Ambulatory Visit | Attending: Radiation Oncology | Admitting: Radiation Oncology

## 2023-07-28 ENCOUNTER — Other Ambulatory Visit: Payer: Self-pay | Admitting: Radiation Therapy

## 2023-07-28 DIAGNOSIS — C7931 Secondary malignant neoplasm of brain: Secondary | ICD-10-CM

## 2023-07-31 ENCOUNTER — Encounter: Payer: Medicare Other | Admitting: Family

## 2023-08-07 ENCOUNTER — Encounter: Payer: Self-pay | Admitting: Physician Assistant

## 2023-08-07 ENCOUNTER — Ambulatory Visit: Payer: Medicare Other | Admitting: Family

## 2023-08-07 ENCOUNTER — Telehealth: Payer: Self-pay | Admitting: Family

## 2023-08-07 VITALS — BP 121/61 | HR 100 | Temp 98.1°F | Resp 16 | Ht 73.0 in | Wt 133.2 lb

## 2023-08-07 DIAGNOSIS — C3411 Malignant neoplasm of upper lobe, right bronchus or lung: Secondary | ICD-10-CM | POA: Diagnosis not present

## 2023-08-07 DIAGNOSIS — Z720 Tobacco use: Secondary | ICD-10-CM

## 2023-08-07 DIAGNOSIS — C642 Malignant neoplasm of left kidney, except renal pelvis: Secondary | ICD-10-CM

## 2023-08-07 DIAGNOSIS — M810 Age-related osteoporosis without current pathological fracture: Secondary | ICD-10-CM

## 2023-08-07 NOTE — Progress Notes (Signed)
 Subjective:     Patient ID: Ian Hart, male    DOB: November 10, 1950, 73 y.o.   MRN: [id number]  No chief complaint on file.   HPI  Discussed the use of AI scribe software for clinical note transcription with the patient, who gave verbal consent to proceed.  History of Present Illness  Patient scheduled a physical for today but he has medicare and Medicare wellness visit is up to date.   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  Has follow up MRI scheduled for the end of May. Completed radiation in February.    Renal Cell Carcinoma- following with Urology.  Had partial nephrectomy. He has follow up CT scan scheduled for May.   Tobacco abuse- pt continues to smoke.   Wt Readings from Last 3 Encounters:  08/07/23 133 lb 3.2 oz (60.4 kg)  06/15/23 138 lb 11.2 oz (62.9 kg)  06/05/23 138 lb 6 oz (62.8 kg)     Health Maintenance Due  Topic Date Due   Zoster Vaccines- Shingrix  (1 of 2) 06/06/1969   DTaP/Tdap/Td (2 - Td or Tdap) 11/09/2021   COVID-19 Vaccine (6 - 2024-25 season) 12/14/2022    Past Medical History:  Diagnosis Date   History of chicken pox    History of shingles 04/2010   Lung cancer (HCC) 02/2020    Past Surgical History:  Procedure Laterality Date   BRONCHIAL BIOPSY  03/22/2020   Procedure: BRONCHIAL BIOPSIES;  Surgeon: Prudy Brownie, DO;  Location: [facility];  Service: Pulmonary;;   BRONCHIAL BRUSHINGS  03/22/2020   Procedure: BRONCHIAL BRUSHINGS;  Surgeon: Prudy Brownie, DO;  Location: [facility];  Service: Pulmonary;;   BRONCHIAL NEEDLE ASPIRATION BIOPSY  03/22/2020   Procedure: BRONCHIAL NEEDLE ASPIRATION BIOPSIES;  Surgeon: Prudy Brownie, DO;  Location: [facility];  Service: Pulmonary;;   BRONCHIAL WASHINGS  03/22/2020   Procedure: BRONCHIAL WASHINGS;  Surgeon: Prudy Brownie, DO;  Location: [facility];  Service: Pulmonary;;   FIDUCIAL MARKER PLACEMENT  03/22/2020   Procedure: FIDUCIAL MARKER PLACEMENT;   Surgeon: Prudy Brownie, DO;  Location: [facility];  Service: Pulmonary;;   NO PAST SURGERIES     ROBOTIC ASSITED PARTIAL NEPHRECTOMY Left 2022   VIDEO BRONCHOSCOPY WITH ENDOBRONCHIAL NAVIGATION N/A 03/22/2020   Procedure: VIDEO BRONCHOSCOPY WITH ENDOBRONCHIAL NAVIGATION;  Surgeon: Prudy Brownie, DO;  Location: [facility];  Service: Pulmonary;  Laterality: N/A;   VIDEO BRONCHOSCOPY WITH ENDOBRONCHIAL ULTRASOUND N/A 03/22/2020   Procedure: VIDEO BRONCHOSCOPY WITH ENDOBRONCHIAL ULTRASOUND;  Surgeon: Prudy Brownie, DO;  Location: [facility];  Service: Pulmonary;  Laterality: N/A;    Family History  Problem Relation Age of Onset   Sudden death Mother    Rheumatic fever Mother    COPD Brother    Liver disease Neg Hx    Colon cancer Neg Hx    Esophageal cancer Neg Hx     Social History   Socioeconomic History   Marital status: Married    Spouse name: Not on file   Number of children: 1   Years of education: Not on file   Highest education level: Associate degree: academic program  Occupational History   Occupation: retired  Tobacco Use   Smoking status: Former    Current packs/day: 0.00    Average packs/day: 2.0 packs/day for 50.0 years (100.0 ttl pk-yrs)    Types: Cigarettes    Quit date: 07/14/2022    Years since quitting: 1.0   Smokeless  tobacco: Never   Tobacco comments:    07/25/20  Vaping Use   Vaping status: Never Used  Substance and Sexual Activity   Alcohol use: Yes    Alcohol/week: 5.0 standard drinks of alcohol    Types: 3 Cans of beer, 2 Shots of liquor per week    Comment: daily   Drug use: No   Sexual activity: Yes  Other Topics Concern   Not on file  Social History Narrative   Regular exercise:  2-3 x weekly (sports, yardwork)   Caffeine use:  3 cups coffee daily   20 yr old son   Wife   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           Social Drivers of Corporate investment banker Strain:  Low Risk  (08/07/2023)   Overall Financial Resource Strain (CARDIA)    Difficulty of Paying Living Expenses: Not hard at all  Food Insecurity: No Food Insecurity (08/07/2023)   Hunger Vital Sign    Worried About Running Out of Food in the Last Year: Never true    Ran Out of Food in the Last Year: Never true  Transportation Needs: No Transportation Needs (08/07/2023)   PRAPARE - Administrator, Civil Service (Medical): No    Lack of Transportation (Non-Medical): No  Physical Activity: Sufficiently Active (08/07/2023)   Exercise Vital Sign    Days of Exercise per Week: 5 days    Minutes of Exercise per Session: 30 min  Recent Concern: Physical Activity - Inactive (05/12/2023)   Exercise Vital Sign    Days of Exercise per Week: 0 days    Minutes of Exercise per Session: 0 min  Stress: Stress Concern Present (08/07/2023)   [employer] of Occupational Health - Occupational Stress Questionnaire    Feeling of Stress : To some extent  Social Connections: Socially Integrated (08/07/2023)   Social Connection and Isolation Panel [NHANES]    Frequency of Communication with Friends and Family: More than three times a week    Frequency of Social Gatherings with Friends and Family: Once a week    Attends Religious Services: More than 4 times per year    Active Member of [employer] or Organizations: Yes    Attends Engineer, structural: More than 4 times per year    Marital Status: Married  Catering manager Violence: Not At Risk (05/12/2023)   Humiliation, Afraid, Rape, and Kick questionnaire    Fear of Current or Ex-Partner: No    Emotionally Abused: No    Physically Abused: No    Sexually Abused: No    Outpatient Medications Prior to Visit  Medication Sig Dispense Refill   LORazepam  (ATIVAN ) 0.5 MG tablet 1 tab po 30 minutes prior to radiation or MRI scans 10 tablet 0   Multiple Vitamin (MULTIVITAMIN) tablet Take 1 tablet by mouth daily.     tamsulosin (FLOMAX) 0.4 MG CAPS  capsule Take 0.4 mg by mouth daily.     Zoster Vaccine Adjuvanted (SHINGRIX ) injection 0.5ml IM now and again in 2-6 months 0.5 mL 1   methylPREDNISolone  (MEDROL ) 4 MG tablet Take 6 tabs po day 1, 5 tabs po day 2, 4 tabs po day 3, 3 tabs po day 4, 2 tabs po day 5, 1 tab po day 6 then stop 21 tablet 0   Varenicline  Tartrate, Starter, (CHANTIX  STARTING MONTH PAK) 0.5 MG X 11 & 1 MG X 42  TBPK Take one 0.5 mg tablet by mouth once daily for 3 days, then increase to one 0.5 mg tablet twice daily for 4 days, then increase to one 1 mg tablet twice daily. 53 each 0   No facility-administered medications prior to visit.    Allergies  Allergen Reactions   Acyclovir And Related Rash    Rash that looked like chicken pox    ROS See HPI    Objective:    Physical Exam Constitutional:      General: He is not in acute distress.    Appearance: He is well-developed.  HENT:     Head: Normocephalic and atraumatic.  Cardiovascular:     Rate and Rhythm: Normal rate and regular rhythm.     Heart sounds: No murmur heard. Pulmonary:     Effort: Pulmonary effort is normal. No respiratory distress.     Breath sounds: Normal breath sounds. No wheezing or rales.  Skin:    General: Skin is warm and dry.  Neurological:     Mental Status: He is alert and oriented to person, place, and time.  Psychiatric:        Behavior: Behavior normal.        Thought Content: Thought content normal.     BP 121/61 (BP Location: Right Arm, Patient Position: Sitting, Cuff Size: Small)   Pulse 100   Temp 98.1 F (36.7 C) (Oral)   Resp 16   Ht 6\' 1"  (1.854 m)   Wt 133 lb 3.2 oz (60.4 kg)   SpO2 99%   BMI 17.57 kg/m  Wt Readings from Last 3 Encounters:  08/07/23 133 lb 3.2 oz (60.4 kg)  06/15/23 138 lb 11.2 oz (62.9 kg)  06/05/23 138 lb 6 oz (62.8 kg)       Assessment & Plan:   Problem List Items Addressed This Visit       Unprioritized   Tobacco abuse - Primary   Still smoking, not interested in quitting  at this time.      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 Management per Oncology.      Renal cell carcinoma of left kidney [facility])   Plan is for follow up CT scan with Urology in May.       Osteoporosis   Relevant Orders   DG Bone Density    I have discontinued Jeziel J. Cowans's Varenicline  Tartrate (Starter) and methylPREDNISolone . I am also having him maintain his multivitamin, tamsulosin, Shingrix , and LORazepam .  No orders of the defined types were placed in this encounter.

## 2023-08-07 NOTE — Assessment & Plan Note (Signed)
 Awaiting follow up MR brain following radiation therapy. Management per Oncology.

## 2023-08-07 NOTE — Telephone Encounter (Signed)
 See mychart.

## 2023-08-07 NOTE — Assessment & Plan Note (Signed)
Still smoking, not interested in quitting at this time

## 2023-08-07 NOTE — Assessment & Plan Note (Signed)
 Plan is for follow up CT scan with Urology in May.

## 2023-09-01 ENCOUNTER — Other Ambulatory Visit (HOSPITAL_BASED_OUTPATIENT_CLINIC_OR_DEPARTMENT_OTHER)

## 2023-09-09 ENCOUNTER — Ambulatory Visit: Admitting: Family

## 2023-09-09 ENCOUNTER — Ambulatory Visit (HOSPITAL_COMMUNITY)
Admission: RE | Admit: 2023-09-09 | Discharge: 2023-09-09 | Disposition: A | Discharge status: Home / Self Care | Source: Ambulatory Visit | Attending: Radiation Oncology | Admitting: Radiation Oncology

## 2023-09-09 DIAGNOSIS — C7931 Secondary malignant neoplasm of brain: Secondary | ICD-10-CM | POA: Diagnosis present

## 2023-09-09 MED ORDER — GADOBUTROL 1 MMOL/ML IV SOLN
7.0000 mL | Freq: Once | INTRAVENOUS | Status: AC | PRN
  Administered 2023-09-09: 7 mL via INTRAVENOUS

## 2023-09-11 ENCOUNTER — Encounter: Payer: Self-pay | Admitting: Radiation Oncology

## 2023-09-11 NOTE — Progress Notes (Signed)
 Telephone nursing appointment for review of most recent MRI Brain results. I verified patient's identity x2 and began nursing interview.    Patient states issues as follows... Pain: Denies  Fatigue: Denies Weakness or loss of control of the extremities: Denies Headache: Denies Seizure: Denies Vision: Denies Memory: Denies Confusion: Denies Skin: Denies Cardiac: Denies Lungs: Denies Dexamethasone : Denies   Patient denies any other related issues at this time.   Meaningful use complete.   Patient aware of their telephone appointment w/ Allana Ishikawa PA-C. I left my extension [phone] in case patient needs anything. Patient verbalized understanding. This concludes the nursing interview.   Patient preferred phone # [phone]    Avery Bodo, LPN

## 2023-09-14 ENCOUNTER — Inpatient Hospital Stay: Attending: Radiation Oncology

## 2023-09-14 ENCOUNTER — Encounter

## 2023-09-14 ENCOUNTER — Ambulatory Visit
Admission: RE | Admit: 2023-09-14 | Discharge: 2023-09-14 | Disposition: A | Discharge status: Home / Self Care | Source: Ambulatory Visit | Attending: Radiation Oncology | Admitting: Radiation Oncology

## 2023-09-14 VITALS — Ht 73.0 in | Wt 133.0 lb

## 2023-09-14 DIAGNOSIS — C3411 Malignant neoplasm of upper lobe, right bronchus or lung: Secondary | ICD-10-CM

## 2023-09-14 DIAGNOSIS — C642 Malignant neoplasm of left kidney, except renal pelvis: Secondary | ICD-10-CM

## 2023-09-14 DIAGNOSIS — C7931 Secondary malignant neoplasm of brain: Secondary | ICD-10-CM

## 2023-09-14 DIAGNOSIS — R93422 Abnormal radiologic findings on diagnostic imaging of left kidney: Secondary | ICD-10-CM | POA: Insufficient documentation

## 2023-09-14 NOTE — Progress Notes (Signed)
 Radiation Oncology         [phone] ________________________________   Outpatient Follow Up - Conducted via telephone at patient request.  I spoke with the patient to conduct this consult visit via telephone. The patient was notified in advance and was offered an in person or telemedicine meeting to allow for face to face communication but instead preferred to proceed with a telephone visit.    Name: Ian Hart        MRN: [id number]  Date of Service: 09/14/2023 DOB: Jan 22, 1951  CC:O'Sullivan, Lovett Ruck, NP    REFERRING PHYSICIAN: Dorrene Gaucher, NP   DIAGNOSIS: The primary encounter diagnosis was Metastasis to brain [facility]). 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   HISTORY OF PRESENT ILLNESS: Ian Hart is a 73 y.o. 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 This was initially found on lung cancer screening scan, and he subsequently underwent a PET scan on 03/01/2020 which revealed hypermetabolism within the right upper lobe nodule as well as an isolated hypermetabolic subcarinal lymph nodes suspicious for nodal disease.  No other extrathoracic hypermetabolic metastases were identified, a left renal lesion which was indeterminate but hypermetabolic was also seen and possibly suspicious for renal cell carcinoma.  He underwent a CT super D scan on 03/22/2020 and subsequent bronchoscopy.  Interestingly bronchoscopy confirmed adenocarcinoma with in the lung nodule and small cell carcinoma within the 7 station lymph node.  He proceeded with chemoRT between 04/17/20 and 06/01/20. He also proceeded with prophylactic cranial irradiation to reduce risks of brain disease. 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  He was offered evaluation to ENT for mastoid effusion, but ultimately his hearing improved and he did not feel that he needed the appointment. A repeat MRI on 11/11/22 that showed punctate foci of enhancement in the left frontal lobe and left cerebellum but it was felt these changes were vascular in nature and to closely follow with repeat MRI for any interval change. This repeat MRI on 01/12/23 showed an increase in the two lesions and 3 additional lesions so he proceeded with SRS to 5 PTV targets. He completed this in October 2024. He was followed by medical oncology and had suspicious findings in the RUL and he was offered reirradiaiton with ultrahypofractionated treatment and this was completed on 04/21/23.   His first post SRS MRI brain on 04/29/23 showed resolution of previously treated disease but about 7 new punctate lesions throughout the brain. There was fluid in his left greater than right mastoid region again, and he was treated with a steroid taper. His case had been reviewed in brain oncology conference, and it was felt that the punctate findings were not certain, so a repeat MRI was recommended a month later. His most recent MRI brain on 05/27/23 showed two new lesions in the left cerebellum, each 2 mm in size, and a possible new lesion in the left cerebellum versus a vascular change. 10 stable lesions were noted ranging in size from 1.5 mm-3 mm, and 4 larger lesions including 2 mm in the right cerebellum, 3 mm in the posterolateral right temporal lobe, 3 mm in the posteriomedial right parietal lobe, and a 2-3 mm right superior frontal gyrus.  His mastoid effusion is small to moderate. His scans have been fused with his prior treatment plan, and essentially there may be up to 16 lesions that have not been treated.  He went on to receive stereotactic radiosurgery to the untreated areas of disease on 06/11/2023 and was doing quite well, his first posttreatment MRI performed on 09/09/2023  unfortunately shows 7 new lesions ranging from 2 to 5 mm in size in both hemispheres involving the tentorium and infratentorial regions without visible edema, and the previously treated lesions are stable to smaller in size.  He is contacted to review these results.  He remains in surveillance with Dr. Marguerita Shih.  On review of systems, the patient reports reports that he is doing well without any dizziness, headaches, difficulty with navigating his environment, changes in vision or auditory function.  He otherwise feels like he is doing quite well and would not know that he has cancer at this time otherwise.  No other complaints are verbalized.  PREVIOUS RADIATION THERAPY:   06/11/23 SRS Treatment Plan Name: Brain_SRS Site: Brain PTV_6_ne1CerebellumL_post15mm PTV_7_ne2CerebellumL_40mm PTV_8_ne3CerebellumL_sup21mm PTV_9_lar1CerebellumR_38mm PTV_10_lar2TemporalR_post95mm PTV_11_lar3ParietalR_32mm PTV_12_lar4FrontalR_43mm PTV_13_sta1CerebellumL_inf48mm PTV_14_sta2CerebellumL_89mm PTV_15_sta3FrontalL_AntInf_74mm  PTV_16_sta4VentricleR_Ant_64mm PTV_17_sta5FrontalL_Ant_60mm PTV_18_sta6FrontoParL_64mm  PTV_19_sta7FrontalL_Ant_56mm PTV_20_sta9FrontalL_8mm  PTV_21_sta10ParietalL_2mm   Technique: SBRT/SRT-IMRT Mode: Photon Dose Per Fraction: 20 Gy Prescribed Dose (Delivered / Prescribed): 20 Gy / 20 Gy Prescribed Fxs (Delivered / Prescribed): 1 / 1    04/06/23-04/21/23 Ultrahypofractionated Radiation Plan Name: Lung_R_UHRT Site: Lung, RUL Technique: IMRT Mode: Photon Dose Per Fraction: 5 Gy Prescribed Dose (Delivered / Prescribed): 50 Gy / 50 Gy Prescribed Fxs (Delivered / Prescribed): 10 / 10   01/27/23-01/30/23: SRS Treatment   Plan Name: Brain_4_dca Site: Brain   PTV_4_PontineR_20mm Technique: SBRT/SRT-3D Mode: Photon Dose Per Fraction: 16 Gy Prescribed Dose (Delivered / Prescribed): 16 Gy / 16 Gy Prescribed Fxs (Delivered / Prescribed): 1 / 1  Plan Name: Brain_5_dca Site:  Brain PTV_1_FrontalL_2.48mm PTV__2_ParietalL_2.62mm PTV_3_FrontalL_2.63mm PTV_5_CerebellarL_1.50mm Technique: SBRT/SRT-3D Mode: Photon Dose Per Fraction: 20 Gy Prescribed Dose (Delivered / Prescribed): 20 Gy / 20 Gy Prescribed Fxs (Delivered / Prescribed): 1 / 1   08/20/2020 through 08/31/2020 Site Technique Total Dose (Gy) Dose per Fx (Gy) Completed Fx Beam Energies  Brain: Brain Complex 25/25 2.5 10/10 6X     Radiation Treatment Dates: 04/17/2020 through 06/01/2020 Site Technique Total Dose (Gy) Dose per Fx (Gy) Completed Fx Beam Energies  Lung, Right: Lung_Rt 3D 60/60 2 30/30 6X, 10X  Lung, Right: Lung_Rt_Bst 3D 6/6 2 3/3 6X, 10X     PAST MEDICAL HISTORY:  Past Medical History:  Diagnosis Date   History of chicken pox    History of shingles 04/2010   Lung cancer (HCC) 02/2020       PAST SURGICAL HISTORY: Past Surgical History:  Procedure Laterality Date   BRONCHIAL BIOPSY  03/22/2020   Procedure: BRONCHIAL BIOPSIES;  Surgeon: Prudy Brownie, DO;  Location: [facility];  Service: Pulmonary;;   BRONCHIAL BRUSHINGS  03/22/2020   Procedure: BRONCHIAL BRUSHINGS;  Surgeon: Prudy Brownie, DO;  Location: [facility];  Service: Pulmonary;;   BRONCHIAL NEEDLE ASPIRATION BIOPSY  03/22/2020   Procedure: BRONCHIAL NEEDLE ASPIRATION BIOPSIES;  Surgeon: Prudy Brownie, DO;  Location: [facility];  Service: Pulmonary;;   BRONCHIAL WASHINGS  03/22/2020   Procedure: BRONCHIAL WASHINGS;  Surgeon: Prudy Brownie, DO;  Location: [facility];  Service: Pulmonary;;   FIDUCIAL MARKER PLACEMENT  03/22/2020   Procedure: FIDUCIAL MARKER PLACEMENT;  Surgeon: Prudy Brownie, DO;  Location: [facility];  Service: Pulmonary;;   NO PAST SURGERIES  ROBOTIC ASSITED PARTIAL NEPHRECTOMY Left 2022   VIDEO BRONCHOSCOPY WITH ENDOBRONCHIAL NAVIGATION N/A 03/22/2020   Procedure: VIDEO BRONCHOSCOPY WITH ENDOBRONCHIAL NAVIGATION;  Surgeon: Prudy Brownie, DO;  Location: [facility];  Service:  Pulmonary;  Laterality: N/A;   VIDEO BRONCHOSCOPY WITH ENDOBRONCHIAL ULTRASOUND N/A 03/22/2020   Procedure: VIDEO BRONCHOSCOPY WITH ENDOBRONCHIAL ULTRASOUND;  Surgeon: Prudy Brownie, DO;  Location: [facility];  Service: Pulmonary;  Laterality: N/A;     FAMILY HISTORY:  Family History  Problem Relation Age of Onset   Sudden death Mother    Rheumatic fever Mother    COPD Brother    Liver disease Neg Hx    Colon cancer Neg Hx    Esophageal cancer Neg Hx      SOCIAL HISTORY:  reports that he quit smoking about 14 months ago. His smoking use included cigarettes. He has a 100 pack-year smoking history. He has never used smokeless tobacco. He reports current alcohol use of about 5.0 standard drinks of alcohol per week. He reports that he does not use drugs. The patient is married and lives in [place]. 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    ALLERGIES: Acyclovir and related   MEDICATIONS:  Current Outpatient Medications  Medication Sig Dispense Refill   LORazepam  (ATIVAN ) 0.5 MG tablet 1 tab po 30 minutes prior to radiation or MRI scans 10 tablet 0   Multiple Vitamin (MULTIVITAMIN) tablet Take 1 tablet by mouth daily.     tamsulosin (FLOMAX) 0.4 MG CAPS capsule Take 0.4 mg by mouth daily.     Zoster Vaccine Adjuvanted (SHINGRIX ) injection 0.5ml IM now and again in 2-6 months 0.5 mL 1   No current facility-administered medications for this encounter.     PHYSICAL EXAM:  Unable to assess due to encounter type.  ECOG = 1  0 - Asymptomatic (Fully active, able to carry on all predisease activities without restriction)  1 - Symptomatic but completely ambulatory (Restricted in physically strenuous activity but ambulatory and able to carry out work of a light or sedentary nature. For example, light housework, office work)  2 - Symptomatic, <50% in bed during the day (Ambulatory and capable of all self care but unable to carry out any work activities. Up  and about more than 50% of waking hours)  3 - Symptomatic, >50% in bed, but not bedbound (Capable of only limited self-care, confined to bed or chair 50% or more of waking hours)  4 - Bedbound (Completely disabled. Cannot carry on any self-care. Totally confined to bed or chair)  5 - Death   Aurea Blossom MM, Creech RH, Tormey DC, et al. [phone]). "Toxicity and response criteria of the [facility]". Am. Hillard Lowes. Oncol. 5 (6): 649-55    LABORATORY DATA:  Lab Results  Component Value Date   WBC 6.4 06/04/2023   HGB 12.9 (L) 06/04/2023   HCT 38.6 (L) 06/04/2023   MCV 94.8 06/04/2023   PLT 208 06/04/2023   Lab Results  Component Value Date   NA 134 (L) 06/04/2023   K 4.7 06/04/2023   CL 99 06/04/2023   CO2 28 06/04/2023   Lab Results  Component Value Date   ALT 11 06/04/2023   AST 19 06/04/2023   ALKPHOS 70 06/04/2023   BILITOT 0.6 06/04/2023      RADIOGRAPHY: MR Brain W Wo Contrast Result Date: 09/09/2023 CLINICAL DATA:  Brain metastases, assess treatment response. History of small cell lung cancer. EXAM: MRI HEAD  WITHOUT AND WITH CONTRAST TECHNIQUE: Multiplanar, multiecho pulse sequences of the brain and surrounding structures were obtained without and with intravenous contrast. CONTRAST:  7mL GADAVIST  GADOBUTROL  1 MMOL/ML IV SOLN COMPARISON:  Head MRI 05/27/2023 FINDINGS: Brain: References are to series 1300. New lesions: 1. 2 mm in the inferomedial right cerebellar hemisphere (image 89). 2. 3 mm in the medial left cerebellar hemisphere (image 103). 3. 2.5 mm in the medial left cerebellar hemisphere (image 106). 4. 2 mm in the right cerebellar hemisphere (image 121). 5. 5 mm in the inferior left occipital lobe (image 130). 6. 2 mm in the anterior left occipital lobe (image 144). 7. 2 mm in the left parietal lobe (image 206). Larger lesions: None. Stable or smaller lesions: 1. 1 mm in the lateral right cerebellar hemisphere, decreased (image 97). 2. 2 mm in the left  cerebellar hemisphere, unchanged (image 99). 3. 1.5 mm in the left cerebellar hemisphere, unchanged (image 108). 4. 1.5 mm in the left cerebellar hemisphere, unchanged (image 109). 5. 1.5 mm in the lateral left cerebellar hemisphere, stable to decreased (image 110). 6. 3 mm in the lateral right temporal lobe, stable to decreased (image 140). 7. 1.5 mm in the inferior left frontal lobe, decreased (image 149). 8. 1 mm in the anterior left frontal lobe, unchanged (image 174). 9. 1-2 mm in the left frontoparietal region, unchanged (image 182). 10. 2 mm in the medial right parietal lobe, unchanged (image 184). 11. 2 mm in the left frontal lobe, unchanged (image 180). 12. 2 mm in the left frontoparietal periventricular white matter, unchanged (image 191). 13. Decreased, faint punctate focus of enhancement in the left parietal lobe (image 195). 14. 1.5 mm in the posterior left frontal white matter, unchanged (image 197). 15. 1.5-2 mm in the superior right frontal gyrus, stable to decreased (image 200). Other brain findings: No acute infarct, significant brain edema, midline shift, hydrocephalus, or extra-axial fluid collection is evident. Scattered chronic microhemorrhages are again noted. There is mild cerebral atrophy. Periventricular white matter T2 hyperintensities are unchanged and nonspecific but may reflect mild chronic small vessel ischemic disease and/or post treatment changes. Vascular: Major intracranial vascular flow voids are preserved. Skull and upper cervical spine: No suspicious marrow lesion. Sinuses/Orbits: Unremarkable orbits. Clear paranasal sinuses. Persistent left larger than right mastoid effusions. Other: None. IMPRESSION: 1. Seven new small brain metastases measuring up to 5 mm.  No edema. 2. Stable to decreased size of all other lesions. Electronically Signed   By: Aundra Lee M.D.   On: 09/09/2023 14:52        IMPRESSION/PLAN: 1. Recurrent Metastatic Stage IIIA, [id number], mixed histology  NSCLC and small cell carcinoma of the RUL and subcarinal nodal station.  We discussed the findings from the patient's recent MRI, as well as the concern for breakthrough growth on his first posttreatment MRI scan.  We discussed the conference review of his case this morning which includes petitioning for systemic therapy out of concern for seeding of the tissues.  The patient is open to the idea of resuming a course of immuno or chemotherapy.  He is also in agreement to proceed with stereotactic radiosurgery.  We discussed the technique could include a course of 1-3 fractions.  He is in agreement with this plan.  We discussed the risks, benefits, short and long-term effects of therapy which the patient is familiar with and is in agreement.  He will come on Friday for IV start and simulation.  He will sign written consent at that  time.   2. 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 He continues to follow up with [facility] in Urology in surveillance. We will follow this expectantly. 3. Bilateral mastoid effusions with hearing loss.  We follow his symptoms to determine when he needs steroid Dosepaks but we will continue to follow these findings at subsequent visits. 4. Claustrophobia.  The patient is comfortable with continuing to use Ativan  as needed prior to MRIs and/or treatment.  We will remind him that he can take Ativan  prior to coming for his simulation later this week.  This encounter was conducted via telephone.  The patient has provided two factor identification and has given verbal consent for this type of encounter and has been advised to only accept a meeting of this type in a secure network environment. The time spent during this encounter was 35 minutes including preparation, discussion, and coordination of the patient's care. The attendants for this meeting include Bettejane Brownie  and Gaynelle Keeling. During the encounter, Bettejane Brownie was located at [facility] in Radiation Oncology. NOHA KARASIK was located at home.    Shelvia Dick, PAC

## 2023-09-15 ENCOUNTER — Telehealth: Payer: Self-pay

## 2023-09-15 ENCOUNTER — Telehealth: Payer: Self-pay | Admitting: Internal Medicine

## 2023-09-15 NOTE — Telephone Encounter (Signed)
 Called pt to discuss and confirm appointment for CT Sim 6/6. Pt confirms and expresses full understanding of times.

## 2023-09-15 NOTE — Telephone Encounter (Signed)
 Moved the patients appointments up per [facility] from radiation. The patient is aware of the changes made.

## 2023-09-17 ENCOUNTER — Other Ambulatory Visit: Payer: Self-pay | Admitting: Radiation Therapy

## 2023-09-17 DIAGNOSIS — C7931 Secondary malignant neoplasm of brain: Secondary | ICD-10-CM

## 2023-09-17 NOTE — Progress Notes (Signed)
 Has armband been applied?  {yes no:314532}  Does patient have an allergy to IV contrast dye?: No   Has patient ever received premedication for IV contrast dye?: {yes no:314532}   Does patient take metformin?: {yes no:314532}  If patient does take metformin when was the last dose: {Time; dates multiple:15870}  Date of lab work: 09/18/2023 BUN: *** CR: *** eGfr: ***  IV site: {iv locations:314275}  Has IV site been added to flowsheet?  {yes UJ:811914}

## 2023-09-18 ENCOUNTER — Ambulatory Visit
Admission: RE | Admit: 2023-09-18 | Discharge: 2023-09-18 | Disposition: A | Discharge status: Home / Self Care | Source: Ambulatory Visit | Attending: Radiation Oncology | Admitting: Radiation Oncology

## 2023-09-18 VITALS — BP 121/67 | HR 99 | Temp 97.6°F | Resp 20 | Ht 73.0 in | Wt 132.1 lb

## 2023-09-18 DIAGNOSIS — F17210 Nicotine dependence, cigarettes, uncomplicated: Secondary | ICD-10-CM | POA: Insufficient documentation

## 2023-09-18 DIAGNOSIS — C7931 Secondary malignant neoplasm of brain: Secondary | ICD-10-CM

## 2023-09-18 DIAGNOSIS — Z510 Encounter for antineoplastic radiation therapy: Secondary | ICD-10-CM | POA: Diagnosis present

## 2023-09-18 DIAGNOSIS — C3411 Malignant neoplasm of upper lobe, right bronchus or lung: Secondary | ICD-10-CM | POA: Diagnosis present

## 2023-09-18 LAB — CMP (CANCER CENTER ONLY)
ALT: 13 U/L (ref 0–44)
AST: 21 U/L (ref 15–41)
Albumin: 4.4 g/dL (ref 3.5–5.0)
Alkaline Phosphatase: 84 U/L (ref 38–126)
Anion gap: 10 (ref 5–15)
BUN: 11 mg/dL (ref 8–23)
CO2: 23 mmol/L (ref 22–32)
Calcium: 9.4 mg/dL (ref 8.9–10.3)
Chloride: 94 mmol/L — ABNORMAL LOW (ref 98–111)
Creatinine: 1.07 mg/dL (ref 0.61–1.24)
GFR, Estimated: >60 mL/min (ref >60)
Glucose, Bld: 85 mg/dL (ref 70–99)
Potassium: 4.1 mmol/L (ref 3.5–5.1)
Sodium: 127 mmol/L — ABNORMAL LOW (ref 135–145)
Total Bilirubin: 0.5 mg/dL (ref 0.0–1.2)
Total Protein: 7.2 g/dL (ref 6.5–8.1)

## 2023-09-18 MED ORDER — SODIUM CHLORIDE 0.9% FLUSH
10.0000 mL | INTRAVENOUS | Status: DC | PRN
  Administered 2023-09-18: 10 mL via INTRAVENOUS

## 2023-09-18 MED ORDER — SODIUM CHLORIDE 0.9% FLUSH
10.0000 mL | Freq: Once | INTRAVENOUS | Status: AC
  Administered 2023-09-18: 10 mL via INTRAVENOUS

## 2023-09-24 DIAGNOSIS — Z510 Encounter for antineoplastic radiation therapy: Secondary | ICD-10-CM | POA: Diagnosis not present

## 2023-09-25 ENCOUNTER — Other Ambulatory Visit: Payer: Self-pay

## 2023-09-25 ENCOUNTER — Ambulatory Visit
Admission: RE | Admit: 2023-09-25 | Discharge: 2023-09-25 | Disposition: A | Discharge status: Home / Self Care | Source: Ambulatory Visit | Attending: Radiation Oncology | Admitting: Radiation Oncology

## 2023-09-25 DIAGNOSIS — Z510 Encounter for antineoplastic radiation therapy: Secondary | ICD-10-CM | POA: Diagnosis not present

## 2023-09-25 LAB — RAD ONC ARIA SESSION SUMMARY
Course Elapsed Days: 0
Plan Fractions Treated to Date: 1
Plan Fractions Treated to Date: 1
Plan Prescribed Dose Per Fraction: 9 Gy
Plan Prescribed Dose Per Fraction: 9 Gy
Plan Total Fractions Prescribed: 3
Plan Total Fractions Prescribed: 3
Plan Total Prescribed Dose: 27 Gy
Plan Total Prescribed Dose: 27 Gy
Reference Point Dosage Given to Date: 9 Gy
Reference Point Dosage Given to Date: 9 Gy
Reference Point Session Dosage Given: 9 Gy
Reference Point Session Dosage Given: 9 Gy
Session Number: 1

## 2023-09-25 NOTE — Progress Notes (Signed)
 Mr. Baudoin rested with us  for 15 minutes following his SRS treatment.  Patient denies headache, dizziness, nausea, diplopia or ringing in the ears. Denies fatigue. Patient without complaints. Understands to avoid strenuous activity for the next 24 hours and call [phone] with needs.   BP 128/69 (BP Location: Left Arm, Patient Position: Sitting, Cuff Size: Normal)   Pulse 78   Temp (!) 97.5 F (36.4 C)   Resp 20   SpO2 100%

## 2023-09-28 ENCOUNTER — Ambulatory Visit
Admission: RE | Admit: 2023-09-28 | Discharge: 2023-09-28 | Disposition: A | Discharge status: Home / Self Care | Source: Ambulatory Visit | Attending: Radiation Oncology | Admitting: Radiation Oncology

## 2023-09-28 ENCOUNTER — Other Ambulatory Visit: Payer: Self-pay

## 2023-09-28 DIAGNOSIS — Z510 Encounter for antineoplastic radiation therapy: Secondary | ICD-10-CM | POA: Diagnosis not present

## 2023-09-28 LAB — RAD ONC ARIA SESSION SUMMARY
Course Elapsed Days: 3
Plan Fractions Treated to Date: 2
Plan Fractions Treated to Date: 2
Plan Prescribed Dose Per Fraction: 9 Gy
Plan Prescribed Dose Per Fraction: 9 Gy
Plan Total Fractions Prescribed: 3
Plan Total Fractions Prescribed: 3
Plan Total Prescribed Dose: 27 Gy
Plan Total Prescribed Dose: 27 Gy
Reference Point Dosage Given to Date: 18 Gy
Reference Point Dosage Given to Date: 18 Gy
Reference Point Session Dosage Given: 9 Gy
Reference Point Session Dosage Given: 9 Gy
Session Number: 2

## 2023-09-28 NOTE — Progress Notes (Signed)
 Mr. Ian Hart with us  for 15 minute observation following his 2 of 3 SRS treatments.  Patient denies headache, dizziness, nausea, diplopia or ringing in the ears. Denies fatigue. Patient without complaints. Ambulates with cane.  No Decadron  orders.  Understands to avoid strenuous activity for the next 24 hours and call [phone] with needs. Vitals:  97.8-73-20-125/70 O2 sat 99% RA.

## 2023-09-30 ENCOUNTER — Ambulatory Visit
Admission: RE | Admit: 2023-09-30 | Discharge: 2023-09-30 | Disposition: A | Discharge status: Home / Self Care | Source: Ambulatory Visit | Attending: Radiation Oncology | Admitting: Radiation Oncology

## 2023-09-30 ENCOUNTER — Telehealth: Payer: Self-pay

## 2023-09-30 ENCOUNTER — Other Ambulatory Visit: Payer: Self-pay

## 2023-09-30 ENCOUNTER — Telehealth (HOSPITAL_BASED_OUTPATIENT_CLINIC_OR_DEPARTMENT_OTHER): Payer: Self-pay

## 2023-09-30 DIAGNOSIS — Z510 Encounter for antineoplastic radiation therapy: Secondary | ICD-10-CM | POA: Diagnosis not present

## 2023-09-30 LAB — RAD ONC ARIA SESSION SUMMARY
Course Elapsed Days: 5
Plan Fractions Treated to Date: 3
Plan Fractions Treated to Date: 3
Plan Prescribed Dose Per Fraction: 9 Gy
Plan Prescribed Dose Per Fraction: 9 Gy
Plan Total Fractions Prescribed: 3
Plan Total Fractions Prescribed: 3
Plan Total Prescribed Dose: 27 Gy
Plan Total Prescribed Dose: 27 Gy
Reference Point Dosage Given to Date: 27 Gy
Reference Point Dosage Given to Date: 27 Gy
Reference Point Session Dosage Given: 9 Gy
Reference Point Session Dosage Given: 9 Gy
Session Number: 3

## 2023-09-30 NOTE — Progress Notes (Signed)
 Ian Hart rested with us  for 15 minutes following his SRS treatment.  Patient denies headache, dizziness, nausea, diplopia or ringing in the ears. Denies fatigue. Patient without complaints. Understands to avoid strenuous activity for the next 24 hours and call [phone] with needs.   BP 118/63 (BP Location: Left Arm, Patient Position: Sitting, Cuff Size: Normal)   Pulse 74   Temp (!) 97.2 F (36.2 C)   Resp 18   SpO2 98%

## 2023-09-30 NOTE — Telephone Encounter (Signed)
 Copied from CRM [id number]. Topic: Clinical - Request for Lab/Test Order >> Sep 30, 2023  3:02 PM [place] wrote: Reason for CRM: Patient wanting to get scheduled for a bone density test

## 2023-09-30 NOTE — Telephone Encounter (Signed)
 Order is in, dexa scanner has been down- but is now back working, radiology should be calling Pt's to schedule soon.

## 2023-10-01 ENCOUNTER — Telehealth: Payer: Self-pay | Admitting: Radiation Therapy

## 2023-10-01 NOTE — Telephone Encounter (Signed)
 I left patient a voicemail requesting a call back. Just checking in on him after the completion of his brain radiation course.   Axel Bohr R.T(R)(T) Radiation Special Procedures Lead

## 2023-10-01 NOTE — Radiation Completion Notes (Signed)
 Patient Name: Ian Hart, Ian Hart MRN: [id number] Date of Birth: 1950/07/03 Referring Physician: Dorrene Gaucher, M.D. Date of Service: 2023-10-01 Radiation Oncologist: Johna Myers, M.D. 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) T=cT0, N=cN2, M=cM0 Intent: Palliative     ==========DELIVERED PLANS==========  First Treatment Date: 2023-09-25 Last Treatment Date: 2023-09-30   Plan Name: Brain_SRS_6t Site: Brain Technique: SBRT/SRT-IMRT Mode: Photon Dose Per Fraction: 9 Gy Prescribed Dose (Delivered / Prescribed): 27 Gy / 27 Gy Prescribed Fxs (Delivered / Prescribed): 3 / 3   Plan Name: Brain_SRS_dca Site: Brain Technique: SBRT/SRT-3D Mode: Photon Dose Per Fraction: 9 Gy Prescribed Dose (Delivered / Prescribed): 27 Gy / 27 Gy Prescribed Fxs (Delivered / Prescribed): 3 / 3     ==========ON TREATMENT VISIT DATES========== 2023-09-25, 2023-09-25, 2023-09-28, 2023-09-28, 2023-09-30, 2023-09-30     ==========UPCOMING VISITS==========       ==========APPENDIX - ON TREATMENT VISIT NOTES==========   See weekly On Treatment Notes in Epic for details in the Media tab (listed as Progress notes on the On Treatment Visit Dates listed above).

## 2023-10-02 ENCOUNTER — Other Ambulatory Visit: Payer: Self-pay

## 2023-10-02 DIAGNOSIS — C3491 Malignant neoplasm of unspecified part of right bronchus or lung: Secondary | ICD-10-CM

## 2023-10-05 ENCOUNTER — Inpatient Hospital Stay (HOSPITAL_BASED_OUTPATIENT_CLINIC_OR_DEPARTMENT_OTHER): Admitting: Internal Medicine

## 2023-10-05 ENCOUNTER — Inpatient Hospital Stay

## 2023-10-05 VITALS — BP 114/68 | HR 83 | Temp 98.1°F | Resp 17 | Ht 73.0 in | Wt 130.7 lb

## 2023-10-05 DIAGNOSIS — C3490 Malignant neoplasm of unspecified part of unspecified bronchus or lung: Secondary | ICD-10-CM

## 2023-10-05 DIAGNOSIS — C7931 Secondary malignant neoplasm of brain: Secondary | ICD-10-CM | POA: Diagnosis present

## 2023-10-05 DIAGNOSIS — C3411 Malignant neoplasm of upper lobe, right bronchus or lung: Secondary | ICD-10-CM | POA: Diagnosis present

## 2023-10-05 DIAGNOSIS — R93422 Abnormal radiologic findings on diagnostic imaging of left kidney: Secondary | ICD-10-CM | POA: Diagnosis not present

## 2023-10-05 DIAGNOSIS — C3491 Malignant neoplasm of unspecified part of right bronchus or lung: Secondary | ICD-10-CM

## 2023-10-05 LAB — CBC WITH DIFFERENTIAL (CANCER CENTER ONLY)
Abs Immature Granulocytes: 0.02 10*3/uL (ref 0.00–0.07)
Basophils Absolute: 0.1 10*3/uL (ref 0.0–0.1)
Basophils Relative: 1 %
Eosinophils Absolute: 0.1 10*3/uL (ref 0.0–0.5)
Eosinophils Relative: 3 %
HCT: 39 % (ref 39.0–52.0)
Hemoglobin: 13.5 g/dL (ref 13.0–17.0)
Immature Granulocytes: 0 %
Lymphocytes Relative: 9 %
Lymphs Abs: 0.5 10*3/uL — ABNORMAL LOW (ref 0.7–4.0)
MCH: 31.8 pg (ref 26.0–34.0)
MCHC: 34.6 g/dL (ref 30.0–36.0)
MCV: 91.8 fL (ref 80.0–100.0)
Monocytes Absolute: 0.5 10*3/uL (ref 0.1–1.0)
Monocytes Relative: 10 %
Neutro Abs: 4.1 10*3/uL (ref 1.7–7.7)
Neutrophils Relative %: 77 %
Platelet Count: 224 10*3/uL (ref 150–400)
RBC: 4.25 MIL/uL (ref 4.22–5.81)
RDW: 12 % (ref 11.5–15.5)
WBC Count: 5.3 10*3/uL (ref 4.0–10.5)
nRBC: 0 % (ref 0.0–0.2)

## 2023-10-05 LAB — CMP (CANCER CENTER ONLY)
ALT: 11 U/L (ref 0–44)
AST: 17 U/L (ref 15–41)
Albumin: 4.6 g/dL (ref 3.5–5.0)
Alkaline Phosphatase: 75 U/L (ref 38–126)
Anion gap: 7 (ref 5–15)
BUN: 16 mg/dL (ref 8–23)
CO2: 26 mmol/L (ref 22–32)
Calcium: 9.6 mg/dL (ref 8.9–10.3)
Chloride: 95 mmol/L — ABNORMAL LOW (ref 98–111)
Creatinine: 1.05 mg/dL (ref 0.61–1.24)
GFR, Estimated: >60 mL/min (ref >60)
Glucose, Bld: 95 mg/dL (ref 70–99)
Potassium: 4.4 mmol/L (ref 3.5–5.1)
Sodium: 128 mmol/L — ABNORMAL LOW (ref 135–145)
Total Bilirubin: 0.6 mg/dL (ref 0.0–1.2)
Total Protein: 7.4 g/dL (ref 6.5–8.1)

## 2023-10-05 NOTE — Progress Notes (Signed)
 [facility] [phone]   [phone]  OFFICE PROGRESS NOTE  [name], NP 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 3) Suspicious left renal lesion   PRIOR THERAPY: Systemic chemotherapy/radiation with cisplatin  80 mg per metered squared on day 1 and etoposide  100 mg per metered squared on days 1, 2, and 3 IV every 3 weeks.  Status post 4 cycles.  Starting from cycle #3, cisplatin  was changed to carboplatin  for an AUC of 5 due to renal insufficiency. First dose on 04/20/20. Onpro was added to the treatment plan starting from cycle #3 due to pancytopenia.     CURRENT THERAPY:  Observation  INTERVAL HISTORY: Ian Hart 73 y.o. male returns to the clinic today for follow-up visit accompanied by his wife.Discussed the use of AI scribe software for clinical note transcription with the patient, who gave verbal consent to proceed.  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 He is accompanied by his wife.  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 A suspicious left renal lesion is also present. He was treated with chemoradiation using cisplatin  and etoposide  and has been under observation since then.  Recently, a brain MRI revealed seven new small metastatic lesions. He underwent three treatments of stereotactic radiosurgery [facility]) on Friday, Monday, and Wednesday, with the last session being particularly difficult. A follow-up MRI  is scheduled in three months.  No chest pain, breathing issues, nausea, or vomiting. He has not gained weight but notes a slight weight loss of about two pounds. His last PET scan was not long ago, and he had a CT scan of the chest in February.       MEDICAL HISTORY: Past Medical History:  Diagnosis Date   History of chicken pox    History of shingles 04/2010   Lung cancer (HCC) 02/2020    ALLERGIES:  is allergic to acyclovir and related.  MEDICATIONS:  Current Outpatient Medications  Medication Sig Dispense Refill   LORazepam  (ATIVAN ) 0.5 MG tablet 1 tab po 30 minutes prior to radiation or MRI scans 10 tablet 0   Multiple Vitamin (MULTIVITAMIN) tablet Take 1 tablet by mouth daily.     tamsulosin (FLOMAX) 0.4 MG CAPS capsule Take 0.4 mg by mouth daily.     Zoster Vaccine Adjuvanted (SHINGRIX ) injection 0.5ml IM now and again in 2-6 months 0.5 mL 1   No current facility-administered medications for this visit.    SURGICAL HISTORY:  Past Surgical History:  Procedure Laterality Date   BRONCHIAL BIOPSY  03/22/2020   Procedure: BRONCHIAL BIOPSIES;  Surgeon: Brenna Adine CROME, DO;  [facility]: [facility];  Service: Pulmonary;;   BRONCHIAL BRUSHINGS  03/22/2020   Procedure: BRONCHIAL BRUSHINGS;  Surgeon: Brenna Adine CROME, DO;  [facility]: [facility];  Service: Pulmonary;;   BRONCHIAL NEEDLE ASPIRATION BIOPSY  03/22/2020   Procedure: BRONCHIAL NEEDLE ASPIRATION BIOPSIES;  Surgeon: Brenna Adine CROME, DO;  [facility]: [facility];  Service: Pulmonary;;   BRONCHIAL WASHINGS  03/22/2020  Procedure: BRONCHIAL WASHINGS;  Surgeon: Brenna Adine CROME, DO;  [facility]: [facility];  Service: Pulmonary;;   FIDUCIAL MARKER PLACEMENT  03/22/2020   Procedure: FIDUCIAL MARKER PLACEMENT;  Surgeon: Brenna Adine CROME, DO;  [facility]: [facility];  Service: Pulmonary;;   NO PAST SURGERIES     ROBOTIC ASSITED PARTIAL NEPHRECTOMY Left 2022   VIDEO BRONCHOSCOPY WITH ENDOBRONCHIAL NAVIGATION N/A 03/22/2020    Procedure: VIDEO BRONCHOSCOPY WITH ENDOBRONCHIAL NAVIGATION;  Surgeon: Brenna Adine CROME, DO;  [facility];  Service: Pulmonary;  Laterality: N/A;   VIDEO BRONCHOSCOPY WITH ENDOBRONCHIAL ULTRASOUND N/A 03/22/2020   Procedure: VIDEO BRONCHOSCOPY WITH ENDOBRONCHIAL ULTRASOUND;  Surgeon: Brenna Adine CROME, DO;  [facility]: [facility];  Service: Pulmonary;  Laterality: N/A;    REVIEW OF SYSTEMS:  Constitutional: positive for fatigue Eyes: negative Ears, nose, mouth, throat, and face: negative Respiratory: negative Cardiovascular: negative Gastrointestinal: negative Genitourinary:negative Integument/breast: negative Hematologic/lymphatic: negative Musculoskeletal:negative Neurological: negative Behavioral/Psych: negative Endocrine: negative Allergic/Immunologic: negative   PHYSICAL EXAMINATION: General appearance: alert, cooperative, fatigued, and no distress Head: Normocephalic, without obvious abnormality, atraumatic Neck: no adenopathy, no JVD, supple, symmetrical, trachea midline, and thyroid  not enlarged, symmetric, no tenderness/mass/nodules Lymph nodes: Cervical, supraclavicular, and axillary nodes normal. Resp: clear to auscultation bilaterally Back: symmetric, no curvature. ROM normal. No CVA tenderness. Cardio: regular rate and rhythm, S1, S2 normal, no murmur, click, rub or gallop GI: soft, non-tender; bowel sounds normal; no masses,  no organomegaly Extremities: extremities normal, atraumatic, no cyanosis or edema Neurologic: Alert and oriented X 3, normal strength and tone. Normal symmetric reflexes. Normal coordination and gait  ECOG PERFORMANCE STATUS: 1 - Symptomatic but completely ambulatory  Blood pressure 114/68, pulse 83, temperature 98.1 F (36.7 C), temperature source Temporal, resp. rate 17, height 6' 1 (1.854 m), weight 130 lb 11.2 oz (59.3 kg), SpO2 100%.  LABORATORY DATA: Lab Results  Component Value Date   WBC 5.3 10/05/2023   HGB 13.5  10/05/2023   HCT 39.0 10/05/2023   MCV 91.8 10/05/2023   PLT 224 10/05/2023      Chemistry      Component Value Date/Time   NA 128 (L) 10/05/2023 0916   K 4.4 10/05/2023 0916   CL 95 (L) 10/05/2023 0916   CO2 26 10/05/2023 0916   BUN 16 10/05/2023 0916   CREATININE 1.05 10/05/2023 0916   CREATININE 0.86 11/12/2011 0917      Component Value Date/Time   CALCIUM 9.6 10/05/2023 0916   ALKPHOS 75 10/05/2023 0916   AST 17 10/05/2023 0916   ALT 11 10/05/2023 0916   BILITOT 0.6 10/05/2023 0916       RADIOGRAPHIC STUDIES: MR Brain W Wo Contrast Result Date: 09/09/2023 CLINICAL DATA:  Brain metastases, assess treatment response. History of small cell lung cancer. EXAM: MRI HEAD WITHOUT AND WITH CONTRAST TECHNIQUE: Multiplanar, multiecho pulse sequences of the brain and surrounding structures were obtained without and with intravenous contrast. CONTRAST:  7mL GADAVIST  GADOBUTROL  1 MMOL/ML IV SOLN COMPARISON:  Head MRI 05/27/2023 FINDINGS: Brain: References are to series 1300. New lesions: 1. 2 mm in the inferomedial right cerebellar hemisphere (image 89). 2. 3 mm in the medial left cerebellar hemisphere (image 103). 3. 2.5 mm in the medial left cerebellar hemisphere (image 106). 4. 2 mm in the right cerebellar hemisphere (image 121). 5. 5 mm in the inferior left occipital lobe (image 130). 6. 2 mm in the anterior left occipital lobe (image 144). 7. 2 mm in the left parietal lobe (image 206). Larger lesions: None. Stable or  smaller lesions: 1. 1 mm in the lateral right cerebellar hemisphere, decreased (image 97). 2. 2 mm in the left cerebellar hemisphere, unchanged (image 99). 3. 1.5 mm in the left cerebellar hemisphere, unchanged (image 108). 4. 1.5 mm in the left cerebellar hemisphere, unchanged (image 109). 5. 1.5 mm in the lateral left cerebellar hemisphere, stable to decreased (image 110). 6. 3 mm in the lateral right temporal lobe, stable to decreased (image 140). 7. 1.5 mm in the inferior  left frontal lobe, decreased (image 149). 8. 1 mm in the anterior left frontal lobe, unchanged (image 174). 9. 1-2 mm in the left frontoparietal region, unchanged (image 182). 10. 2 mm in the medial right parietal lobe, unchanged (image 184). 11. 2 mm in the left frontal lobe, unchanged (image 180). 12. 2 mm in the left frontoparietal periventricular white matter, unchanged (image 191). 13. Decreased, faint punctate focus of enhancement in the left parietal lobe (image 195). 14. 1.5 mm in the posterior left frontal white matter, unchanged (image 197). 15. 1.5-2 mm in the superior right frontal gyrus, stable to decreased (image 200). Other brain findings: No acute infarct, significant brain edema, midline shift, hydrocephalus, or extra-axial fluid collection is evident. Scattered chronic microhemorrhages are again noted. There is mild cerebral atrophy. Periventricular white matter T2 hyperintensities are unchanged and nonspecific but may reflect mild chronic small vessel ischemic disease and/or post treatment changes. Vascular: Major intracranial vascular flow voids are preserved. Skull and upper cervical spine: No suspicious marrow lesion. Sinuses/Orbits: Unremarkable orbits. Clear paranasal sinuses. Persistent left larger than right mastoid effusions. Other: None. IMPRESSION: 1. Seven new small brain metastases measuring up to 5 mm.  No edema. 2. Stable to decreased size of all other lesions. Electronically Signed   By: Dasie Hamburg M.D.   On: 09/09/2023 14:52    ASSESSMENT AND PLAN: This is a very pleasant 73 years old white male diagnosed with poorly differentiated carcinoma consistent with adenocarcinoma in the right upper lobe.  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  The patient also has a suspicious left renal lesion concerning for renal cell carcinoma. He is status post a course of systemic chemotherapy initially with cisplatin  and etoposide  but the  cisplatin  was discontinued secondary to renal insufficiency and the patient continued 3 more cycles of his systemic chemotherapy with carboplatin  and etoposide  concurrent with radiation.   He had evidence for disease recurrence with metastatic brain lesions in addition to right hilar lymphadenopathy treated with SRS as well as palliative radiotherapy to the right hilar area. 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 Recent brain MRI revealed seven new small metastatic lesions, treated with SRS over three sessions. No growth in previous lesions. Concern for systemic spread due to new brain metastases. Decision made to perform a PET scan instead of a CT scan for comprehensive assessment of potential systemic disease spread. - Order PET scan of chest, abdomen, and pelvis within one to two weeks to assess for systemic disease spread. - Cancel previously ordered CT scan. - Schedule follow-up appointment in three weeks to discuss PET scan results.  Suspicious left renal lesion Suspicious left renal lesion noted.   The patient was advised to call immediately if he has any concerning symptoms in the interval. The patient voices  understanding of current disease status and treatment options and is in agreement with the current care plan.  All questions were answered. The patient knows to call the clinic with any problems, questions or concerns. We can certainly see the patient much sooner if necessary. The total time spent in the appointment was 30 minutes including review of chart and various tests results, discussions about plan of care and coordination of care plan .   Disclaimer: This note was dictated with voice recognition software. Similar sounding words can inadvertently be  transcribed and may not be corrected upon review.

## 2023-10-05 NOTE — Assessment & Plan Note (Signed)
 Lab Results  Component Value Date   PSA 4.53 (H) 07/30/2022   PSA 2.31 02/03/2014   PSA 1.58 11/12/2011

## 2023-10-06 ENCOUNTER — Other Ambulatory Visit

## 2023-10-06 ENCOUNTER — Ambulatory Visit (HOSPITAL_BASED_OUTPATIENT_CLINIC_OR_DEPARTMENT_OTHER)
Admission: RE | Admit: 2023-10-06 | Discharge: 2023-10-06 | Disposition: A | Discharge status: Home / Self Care | Source: Ambulatory Visit | Attending: Family | Admitting: Family

## 2023-10-06 ENCOUNTER — Telehealth: Payer: Self-pay | Admitting: Family

## 2023-10-06 ENCOUNTER — Ambulatory Visit (INDEPENDENT_AMBULATORY_CARE_PROVIDER_SITE_OTHER): Admitting: Family

## 2023-10-06 VITALS — BP 119/57 | HR 75 | Temp 98.6°F | Resp 16 | Ht 73.0 in | Wt 131.0 lb

## 2023-10-06 DIAGNOSIS — R634 Abnormal weight loss: Secondary | ICD-10-CM | POA: Diagnosis not present

## 2023-10-06 DIAGNOSIS — R972 Elevated prostate specific antigen [PSA]: Secondary | ICD-10-CM | POA: Diagnosis not present

## 2023-10-06 DIAGNOSIS — M79661 Pain in right lower leg: Secondary | ICD-10-CM | POA: Insufficient documentation

## 2023-10-06 LAB — BASIC METABOLIC PANEL WITH GFR
BUN: 14 mg/dL (ref 6–23)
CO2: 28 meq/L (ref 19–32)
Calcium: 9.5 mg/dL (ref 8.4–10.5)
Chloride: 92 meq/L — ABNORMAL LOW (ref 96–112)
Creatinine, Ser: 0.99 mg/dL (ref 0.40–1.50)
GFR: 75.67 mL/min (ref >60.00)
Glucose, Bld: 100 mg/dL — ABNORMAL HIGH (ref 70–99)
Potassium: 4.3 meq/L (ref 3.5–5.1)
Sodium: 125 meq/L — ABNORMAL LOW (ref 135–145)

## 2023-10-06 LAB — PSA: PSA: 5.24 ng/mL — ABNORMAL HIGH (ref 0.10–4.00)

## 2023-10-06 MED ORDER — MELOXICAM 7.5 MG PO TABS: 7.5000 mg | ORAL_TABLET | Freq: Every day | ORAL | 0 refills | Status: DC

## 2023-10-06 NOTE — Progress Notes (Signed)
 Subjective:     Patient ID: Ian Hart, male    DOB: Jul 09, 1950, 73 y.o.   MRN: [id number]  Chief Complaint  Patient presents with   Leg Pain    Patient complains of calf muscle pain on the right leg on and off for 2 weeks.     Leg Pain     Discussed the use of AI scribe software for clinical note transcription with the patient, who gave verbal consent to proceed.  History of Present Illness  Ian Hart is a 73 year old male with metastatic lung cancer who presents with right calf pain.  He experiences sharp, shooting, and intermittent pain in his right calf without radiation to other areas. There is no associated low back pain. He has metastatic lung cancer with brain metastases and is under treatment from radiation oncology and general oncology.     Health Maintenance Due  Topic Date Due   Zoster Vaccines- Shingrix  (1 of 2) 06/06/1969   DTaP/Tdap/Td (2 - Td or Tdap) 11/09/2021   COVID-19 Vaccine (6 - 2024-25 season) 12/14/2022    Past Medical History:  Diagnosis Date   History of chicken pox    History of shingles 04/2010   Lung cancer (HCC) 02/2020    Past Surgical History:  Procedure Laterality Date   BRONCHIAL BIOPSY  03/22/2020   Procedure: BRONCHIAL BIOPSIES;  Surgeon: Brenna Adine CROME, DO;  [facility];  Service: Pulmonary;;   BRONCHIAL BRUSHINGS  03/22/2020   Procedure: BRONCHIAL BRUSHINGS;  Surgeon: Brenna Adine CROME, DO;  [facility]: [facility];  Service: Pulmonary;;   BRONCHIAL NEEDLE ASPIRATION BIOPSY  03/22/2020   Procedure: BRONCHIAL NEEDLE ASPIRATION BIOPSIES;  Surgeon: Brenna Adine CROME, DO;  [facility]: [facility];  Service: Pulmonary;;   BRONCHIAL WASHINGS  03/22/2020   Procedure: BRONCHIAL WASHINGS;  Surgeon: Brenna Adine CROME, DO;  [facility];  Service: Pulmonary;;   FIDUCIAL MARKER PLACEMENT  03/22/2020   Procedure: FIDUCIAL MARKER PLACEMENT;  Surgeon: Brenna Adine CROME, DO;  [facility]: [facility];  Service:  Pulmonary;;   NO PAST SURGERIES     ROBOTIC ASSITED PARTIAL NEPHRECTOMY Left 2022   VIDEO BRONCHOSCOPY WITH ENDOBRONCHIAL NAVIGATION N/A 03/22/2020   Procedure: VIDEO BRONCHOSCOPY WITH ENDOBRONCHIAL NAVIGATION;  Surgeon: Brenna Adine CROME, DO;  [facility];  Service: Pulmonary;  Laterality: N/A;   VIDEO BRONCHOSCOPY WITH ENDOBRONCHIAL ULTRASOUND N/A 03/22/2020   Procedure: VIDEO BRONCHOSCOPY WITH ENDOBRONCHIAL ULTRASOUND;  Surgeon: Brenna Adine CROME, DO;  [facility]: [facility];  Service: Pulmonary;  Laterality: N/A;    Family History  Problem Relation Age of Onset   Sudden death Mother    Rheumatic fever Mother    COPD Brother    Liver disease Neg Hx    Colon cancer Neg Hx    Esophageal cancer Neg Hx     Social History   Socioeconomic History   Marital status: Married    Spouse name: Not on file   Number of children: 1   Years of education: Not on file   Highest education level: Associate degree: academic program  Occupational History   Occupation: retired  Tobacco Use   Smoking status: Former    Current packs/day: 0.00    Average packs/day: 2.0 packs/day for 50.0 years (100.0 ttl pk-yrs)    Types: Cigarettes    Quit date: 07/14/2022    Years since quitting: 1.2   Smokeless tobacco: Never   Tobacco comments:    07/25/20  Vaping Use   Vaping status:  Never Used  Substance and Sexual Activity   Alcohol use: Yes    Alcohol/week: 5.0 standard drinks of alcohol    Types: 3 Cans of beer, 2 Shots of liquor per week    Comment: daily   Drug use: No   Sexual activity: Yes  Other Topics Concern   Not on file  Social History Narrative   Regular exercise:  2-3 x weekly (sports, yardwork)   Caffeine use:  3 cups coffee daily   17 yr old son   Wife   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           Social Drivers of Corporate investment banker Strain: Low Risk  (08/07/2023)   Overall Financial Resource Strain  (CARDIA)    Difficulty of Paying Living Expenses: Not hard at all  Food Insecurity: No Food Insecurity (09/11/2023)   Hunger Vital Sign    Worried About Running Out of Food in the Last Year: Never true    Ran Out of Food in the Last Year: Never true  Transportation Needs: No Transportation Needs (09/11/2023)   PRAPARE - Administrator, Civil Service (Medical): No    Lack of Transportation (Non-Medical): No  Physical Activity: Unknown (10/05/2023)   Exercise Vital Sign    Days of Exercise per Week: 6 days    Minutes of Exercise per Session: Not on file  Stress: Stress Concern Present (08/07/2023)   [employer] of Occupational Health - Occupational Stress Questionnaire    Feeling of Stress : To some extent  Social Connections: Socially Integrated (08/07/2023)   Social Connection and Isolation Panel    Frequency of Communication with Friends and Family: More than three times a week    Frequency of Social Gatherings with Friends and Family: Once a week    Attends Religious Services: More than 4 times per year    Active Member of [employer] or Organizations: Yes    Attends Engineer, structural: More than 4 times per year    Marital Status: Married  Catering manager Violence: Not At Risk (09/11/2023)   Humiliation, Afraid, Rape, and Kick questionnaire    Fear of Current or Ex-Partner: No    Emotionally Abused: No    Physically Abused: No    Sexually Abused: No    Outpatient Medications Prior to Visit  Medication Sig Dispense Refill   LORazepam  (ATIVAN ) 0.5 MG tablet 1 tab po 30 minutes prior to radiation or MRI scans 10 tablet 0   Multiple Vitamin (MULTIVITAMIN) tablet Take 1 tablet by mouth daily.     tamsulosin (FLOMAX) 0.4 MG CAPS capsule Take 0.4 mg by mouth daily.     Zoster Vaccine Adjuvanted (SHINGRIX ) injection 0.5ml IM now and again in 2-6 months 0.5 mL 1   No facility-administered medications prior to visit.    Allergies  Allergen Reactions   Acyclovir  And Related Rash    Rash that looked like chicken pox    ROS See HPI    Objective:    Physical Exam Constitutional:      General: He is not in acute distress.    Appearance: He is well-developed.  HENT:     Head: Normocephalic and atraumatic.   Cardiovascular:     Rate and Rhythm: Normal rate and regular rhythm.     Heart sounds: No murmur heard. Pulmonary:     Effort: Pulmonary effort is normal. No respiratory distress.  Breath sounds: Normal breath sounds. No wheezing or rales.   Skin:    General: Skin is warm and dry.   Neurological:     Mental Status: He is alert and oriented to person, place, and time.   Psychiatric:        Behavior: Behavior normal.        Thought Content: Thought content normal.      BP (!) 119/57 (BP Location: Right Arm, Patient Position: Sitting, Cuff Size: Small)   Pulse 75   Temp 98.6 F (37 C) (Oral)   Resp 16   Ht 6' 1 (1.854 m)   Wt 131 lb (59.4 kg)   SpO2 100%   BMI 17.28 kg/m  Wt Readings from Last 3 Encounters:  10/06/23 131 lb (59.4 kg)  10/05/23 130 lb 11.2 oz (59.3 kg)  09/18/23 132 lb 3.2 oz (60 kg)       Assessment & Plan:   Problem List Items Addressed This Visit       Unprioritized   Weight loss   Weight seems to have stabilized. Wt Readings from Last 3 Encounters:  10/06/23 131 lb (59.4 kg)  10/05/23 130 lb 11.2 oz (59.3 kg)  09/18/23 132 lb 3.2 oz (60 kg)         Relevant Orders   Basic Metabolic Panel (BMET)   Right calf pain - Primary    Sharp, shooting, intermittent pain. Differential includes DVT and musculoskeletal causes. Radiculopathy unlikely. - Obtain right leg ultrasound to rule out DVT. - Initiate meloxicam  trial for pain management.      Relevant Medications   meloxicam  (MOBIC ) 7.5 MG tablet   Other Relevant Orders   US  Venous Img Lower Unilateral Right   Elevated PSA   Lab Results  Component Value Date   PSA 4.53 (H) 07/30/2022   PSA 2.31 02/03/2014   PSA 1.58 11/12/2011           Relevant Orders   PSA    I am having Maude PARAS. Goostree start on meloxicam . I am also having him maintain his multivitamin, tamsulosin, Shingrix , and LORazepam .  Meds ordered this encounter  Medications   meloxicam  (MOBIC ) 7.5 MG tablet    Sig: Take 1 tablet (7.5 mg total) by mouth daily.    Dispense:  14 tablet    Refill:  0    Supervising Provider:   DOMENICA BLACKBIRD A [[id number]]

## 2023-10-06 NOTE — Telephone Encounter (Signed)
 Pt has appt with [facility] today. Lab appt cancelled.

## 2023-10-06 NOTE — Patient Instructions (Signed)
 VISIT SUMMARY:  You visited us  today due to sharp, shooting pain in your right calf. We discussed potential causes and planned further tests and treatments.  YOUR PLAN:  RIGHT CALF PAIN: You have sharp, shooting, and intermittent pain in your right calf. We need to rule out a blood clot and consider other possible causes. -We will obtain an ultrasound of your right leg to rule out a blood clot (DVT). -We will start you on a trial of meloxicam  to help manage the pain.  METASTATIC LUNG CANCER WITH BRAIN METASTASES: You are continuing treatment for metastatic lung cancer with brain metastases. -Continue following the treatment plan set by your oncology team.

## 2023-10-06 NOTE — Assessment & Plan Note (Signed)
 Weight seems to have stabilized. Wt Readings from Last 3 Encounters:  10/06/23 131 lb (59.4 kg)  10/05/23 130 lb 11.2 oz (59.3 kg)  09/18/23 132 lb 3.2 oz (60 kg)

## 2023-10-06 NOTE — Assessment & Plan Note (Signed)
  Sharp, shooting, intermittent pain. Differential includes DVT and musculoskeletal causes. Radiculopathy unlikely. - Obtain right leg ultrasound to rule out DVT. - Initiate meloxicam  trial for pain management.

## 2023-10-06 NOTE — Telephone Encounter (Signed)
 Good morning I see orders pending from yesterday I just need them in future and released for his appointment today please

## 2023-10-07 ENCOUNTER — Other Ambulatory Visit: Payer: Self-pay | Admitting: Radiation Therapy

## 2023-10-07 ENCOUNTER — Ambulatory Visit: Payer: Self-pay | Admitting: Family

## 2023-10-07 ENCOUNTER — Telehealth: Payer: Self-pay | Admitting: Family

## 2023-10-07 DIAGNOSIS — R972 Elevated prostate specific antigen [PSA]: Secondary | ICD-10-CM

## 2023-10-07 DIAGNOSIS — C7931 Secondary malignant neoplasm of brain: Secondary | ICD-10-CM

## 2023-10-07 NOTE — Telephone Encounter (Addendum)
 PSA is up a little bit.  Please fax copy of PSA to his urologist:    Sherran Cy Roulette, NP  NPI: [id number] [phone] (Work) [phone] (Fax)   Please advise pt and remind him to schedule his annual follow up with Urology.   Also, his sodium is very low.  Please increase salt in diet and limit fluid intake to 1.5 liters a day. Repeat bmet in 2 weeks.

## 2023-10-08 NOTE — Telephone Encounter (Signed)
 Patient notified of results and provider's recommendations. He verbalized understanding. He was scheduled to come back in 2 weeks for bmet. PSA faxed to urology.

## 2023-10-13 ENCOUNTER — Telehealth: Payer: Self-pay | Admitting: Family

## 2023-10-13 ENCOUNTER — Ambulatory Visit (HOSPITAL_BASED_OUTPATIENT_CLINIC_OR_DEPARTMENT_OTHER)
Admission: RE | Admit: 2023-10-13 | Discharge: 2023-10-13 | Disposition: A | Discharge status: Home / Self Care | Source: Ambulatory Visit | Attending: Family | Admitting: Family

## 2023-10-13 DIAGNOSIS — M810 Age-related osteoporosis without current pathological fracture: Secondary | ICD-10-CM

## 2023-10-13 MED ORDER — CALTRATE 600+D PLUS MINERALS 600-800 MG-UNIT PO TABS
1.0000 | ORAL_TABLET | Freq: Two times a day (BID) | ORAL | Status: AC
Start: 2023-10-13 — End: ?

## 2023-10-13 MED ORDER — ALENDRONATE SODIUM 70 MG PO TABS: 70.0000 mg | ORAL_TABLET | ORAL | 4 refills | Status: AC

## 2023-10-13 NOTE — Telephone Encounter (Signed)
 Your bone density shows osteoporosis or bone thinning.  I recommend that he start fosamax  once weekly for bone health.  Take in AM on empty stomach, sit upright for 90 minutes after taking.   Also start calcium in the form of of caltrate 600mg  + D one tablet by mouth twice daily. This is available over the counter.  In addition, please ensure regular weight bearing exercise such as walking.

## 2023-10-14 NOTE — Telephone Encounter (Signed)
 Patient notified of results, provider's comments and recommendations.

## 2023-10-19 ENCOUNTER — Ambulatory Visit: Payer: Self-pay | Admitting: Family

## 2023-10-19 ENCOUNTER — Other Ambulatory Visit (INDEPENDENT_AMBULATORY_CARE_PROVIDER_SITE_OTHER)

## 2023-10-19 LAB — BASIC METABOLIC PANEL WITH GFR
BUN: 11 mg/dL (ref 6–23)
CO2: 26 meq/L (ref 19–32)
Calcium: 9.3 mg/dL (ref 8.4–10.5)
Chloride: 92 meq/L — ABNORMAL LOW (ref 96–112)
Creatinine, Ser: 1.08 mg/dL (ref 0.40–1.50)
GFR: 68.15 mL/min (ref >60.00)
Glucose, Bld: 119 mg/dL — ABNORMAL HIGH (ref 70–99)
Potassium: 4.4 meq/L (ref 3.5–5.1)
Sodium: 128 meq/L — ABNORMAL LOW (ref 135–145)

## 2023-10-21 ENCOUNTER — Other Ambulatory Visit

## 2023-10-21 NOTE — Progress Notes (Signed)
  Radiation Oncology         [phone]) [phone] ________________________________  Name: Ian Hart MRN: [id number]  Date of Service: 10/26/2023  DOB: May 08, 1950  Post Treatment Telephone Note  Diagnosis:  Recurrent Metastatic Stage IIIA, [id number], mixed histology NSCLC and small cell carcinoma of the RUL and subcarinal nodal station. (as documented in provider EOT note)   The patient was available for call today. Refused nursing questions but states He is doing okay and is NOT taking Decadron .  The patient was counseled that he  will be contacted by our brain and spine navigator to schedule surveillance imaging. The patient was encouraged to call if he  have not received a call to schedule imaging, or if he develops concerns or questions regarding radiation. The patient will also continue to follow up with Dr. Sherrod Sherrod in medical oncology.  This concludes the interaction.  Rosaline Minerva, LPN

## 2023-10-22 ENCOUNTER — Encounter (HOSPITAL_COMMUNITY)
Admission: RE | Admit: 2023-10-22 | Discharge: 2023-10-22 | Disposition: A | Discharge status: Home / Self Care | Source: Ambulatory Visit | Attending: Internal Medicine | Admitting: Internal Medicine

## 2023-10-22 ENCOUNTER — Other Ambulatory Visit: Payer: Self-pay | Admitting: Family

## 2023-10-22 DIAGNOSIS — C771 Secondary and unspecified malignant neoplasm of intrathoracic lymph nodes: Secondary | ICD-10-CM | POA: Diagnosis not present

## 2023-10-22 DIAGNOSIS — C3490 Malignant neoplasm of unspecified part of unspecified bronchus or lung: Secondary | ICD-10-CM | POA: Insufficient documentation

## 2023-10-22 DIAGNOSIS — E278 Other specified disorders of adrenal gland: Secondary | ICD-10-CM | POA: Insufficient documentation

## 2023-10-22 DIAGNOSIS — M898X8 Other specified disorders of bone, other site: Secondary | ICD-10-CM | POA: Insufficient documentation

## 2023-10-22 DIAGNOSIS — I700 Atherosclerosis of aorta: Secondary | ICD-10-CM | POA: Diagnosis not present

## 2023-10-22 DIAGNOSIS — J439 Emphysema, unspecified: Secondary | ICD-10-CM | POA: Insufficient documentation

## 2023-10-22 DIAGNOSIS — C7931 Secondary malignant neoplasm of brain: Secondary | ICD-10-CM | POA: Insufficient documentation

## 2023-10-22 LAB — GLUCOSE, CAPILLARY: Glucose-Capillary: 110 mg/dL — ABNORMAL HIGH (ref 70–99)

## 2023-10-22 MED ORDER — FLUDEOXYGLUCOSE F - 18 (FDG) INJECTION
6.2000 | Freq: Once | INTRAVENOUS | Status: AC
  Administered 2023-10-22: 6.2 via INTRAVENOUS

## 2023-10-22 NOTE — Telephone Encounter (Signed)
 Copied from CRM [id number]. Topic: Clinical - Medication Refill >> Oct 22, 2023  3:36 PM DeAngela L wrote: Medication: tamsulosin (FLOMAX) 0.4 MG CAPS capsule   Has the patient contacted their pharmacy? Yes  (Agent: If no, request that the patient contact the pharmacy for the refill. If patient does not wish to contact the pharmacy document the reason why and proceed with request.) (Agent: If yes, when and what did the pharmacy advise?)  This is the patient's preferred pharmacy:  [facility] [place], [place] - [address] [place]  [address] Phone: [phone] Fax: [phone]   Is this the correct pharmacy for this prescription? Yes  If no, delete pharmacy and type the correct one.   Has the prescription been filled recently? Yes   Is the patient out of the medication? yes  Has the patient been seen for an appointment in the last year OR does the patient have an upcoming appointment? Yes  Can we respond through MyChart? Yes   Agent: Please be advised that Rx refills may take up to 3 business days. We ask that you follow-up with your pharmacy.

## 2023-10-23 MED ORDER — TAMSULOSIN HCL 0.4 MG PO CAPS: 0.4000 mg | ORAL_CAPSULE | Freq: Every day | ORAL | 1 refills | Status: DC

## 2023-10-26 ENCOUNTER — Ambulatory Visit
Admission: RE | Admit: 2023-10-26 | Discharge: 2023-10-26 | Disposition: A | Discharge status: Home / Self Care | Source: Ambulatory Visit | Attending: Radiation Oncology | Admitting: Radiation Oncology

## 2023-10-26 DIAGNOSIS — F17210 Nicotine dependence, cigarettes, uncomplicated: Secondary | ICD-10-CM | POA: Insufficient documentation

## 2023-10-26 DIAGNOSIS — C3411 Malignant neoplasm of upper lobe, right bronchus or lung: Secondary | ICD-10-CM | POA: Insufficient documentation

## 2023-10-26 DIAGNOSIS — Z510 Encounter for antineoplastic radiation therapy: Secondary | ICD-10-CM | POA: Insufficient documentation

## 2023-10-26 NOTE — Progress Notes (Signed)
 Post Treatment Telephone Note  Diagnosis: Metastasis to brain  The patient was available for call today.  The patient did not note fatigue during radiation. The patient did not note hair loss or skin changes in the field of radiation during therapy. The patient is not taking dexamethasone . The patient does not have symptoms of  weakness or loss of control of the extremities. The patient does not have symptoms of headache. The patient does not have symptoms of seizure or uncontrolled movement. The patient does not have symptoms of changes in vision. The patient does not have changes in speech. The patient does not have confusion.   The patient was counseled that he  will be contacted by our brain and spine navigator to schedule surveillance imaging. The patient was encouraged to call if he  have not received a call to schedule imaging, or if he develops concerns or questions regarding radiation. The patient will also continue to follow up with Dr. Sherrod in medical oncology.  Patient reports will be seeing Dr. Sherrod soon.

## 2023-10-28 ENCOUNTER — Other Ambulatory Visit: Payer: Self-pay | Admitting: Radiation Oncology

## 2023-10-28 ENCOUNTER — Telehealth: Payer: Self-pay | Admitting: Radiation Oncology

## 2023-10-28 DIAGNOSIS — C3411 Malignant neoplasm of upper lobe, right bronchus or lung: Secondary | ICD-10-CM

## 2023-10-28 NOTE — Telephone Encounter (Signed)
 I spoke with the patient after Dr. Dewey reviewed his PET imaging and recommends an MRI of the thoracic and lumbar spine. The patient is having intermittent numbness in his calf but no low or mid back or chest wall pain. He denies any saddle anesthesia, drop foot, or loss of sensation in these areas. We discussed that the numbness in his medial anterior calf localizes to the L4 findings on his PET scan. We discussed that we anticipate his meeting with Dr. Sherrod will include recommendations for systemic therapy, and we also discussed that the results of his MRI scans could also involve recommendations for radiation treatments. He is in agreement to proceed. I gave him cautions to call for and we discussed if his symptoms in the right leg worsened, we would have a low threshold to consider steroids. He will keep us  informed of any changes he notes.

## 2023-10-29 ENCOUNTER — Inpatient Hospital Stay: Attending: Radiation Oncology | Admitting: Internal Medicine

## 2023-10-29 ENCOUNTER — Telehealth: Payer: Self-pay | Admitting: Internal Medicine

## 2023-10-29 ENCOUNTER — Telehealth: Payer: Self-pay | Admitting: *Deleted

## 2023-10-29 ENCOUNTER — Encounter (HOSPITAL_COMMUNITY): Payer: Self-pay

## 2023-10-29 VITALS — BP 128/63 | HR 88 | Temp 98.1°F | Resp 17 | Ht 73.0 in | Wt 128.0 lb

## 2023-10-29 DIAGNOSIS — C3411 Malignant neoplasm of upper lobe, right bronchus or lung: Secondary | ICD-10-CM | POA: Insufficient documentation

## 2023-10-29 DIAGNOSIS — C7931 Secondary malignant neoplasm of brain: Secondary | ICD-10-CM | POA: Diagnosis present

## 2023-10-29 DIAGNOSIS — C779 Secondary and unspecified malignant neoplasm of lymph node, unspecified: Secondary | ICD-10-CM | POA: Diagnosis not present

## 2023-10-29 DIAGNOSIS — C7971 Secondary malignant neoplasm of right adrenal gland: Secondary | ICD-10-CM | POA: Insufficient documentation

## 2023-10-29 DIAGNOSIS — F17210 Nicotine dependence, cigarettes, uncomplicated: Secondary | ICD-10-CM | POA: Insufficient documentation

## 2023-10-29 DIAGNOSIS — Z923 Personal history of irradiation: Secondary | ICD-10-CM | POA: Diagnosis not present

## 2023-10-29 DIAGNOSIS — Z9221 Personal history of antineoplastic chemotherapy: Secondary | ICD-10-CM | POA: Insufficient documentation

## 2023-10-29 NOTE — Progress Notes (Signed)
 [facility] Telephone:([phone]   Fax:([phone]) [phone]  OFFICE PROGRESS NOTE  [name], NP 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 3) Suspicious left renal lesion   PRIOR THERAPY: Systemic chemotherapy/radiation with cisplatin  80 mg per metered squared on day 1 and etoposide  100 mg per metered squared on days 1, 2, and 3 IV every 3 weeks.  Status post 4 cycles.  Starting from cycle #3, cisplatin  was changed to carboplatin  for an AUC of 5 due to renal insufficiency. First dose on 04/20/20. Onpro was added to the treatment plan starting from cycle #3 due to pancytopenia.     CURRENT THERAPY:  Observation  INTERVAL HISTORY: Ian Hart 73 y.o. male returns to the clinic today for follow-up visit accompanied by his wife.Discussed the use of AI scribe software for clinical note transcription with the patient, who gave verbal consent to proceed.  History of Present Illness Ian Hart is a 73 year old male with adenocarcinoma and small cell lung cancer who presents with evidence of disease recurrence.  He has a history of adenocarcinoma and small cell lung cancer, initially diagnosed in December 2021. Treatment included systemic chemotherapy with cisplatin  and etoposide  concurrent with radiation, completed in May 2022. Since then, he has been under observation.  Recently, a PET scan revealed hypermetabolic activity in lymph nodes in the mediastinum and a suspicious spot in the right adrenal gland. It remains unclear whether the recurrence is due to adenocarcinoma or small cell lung cancer.  He has completed radiation therapy to the brain. He mentions that his veins have been overused, indicating a history of frequent venipuncture or intravenous  access.  An MRI of the lumbar spine is scheduled for tomorrow.    MEDICAL HISTORY: Past Medical History:  Diagnosis Date   History of chicken pox    History of shingles 04/2010   Lung cancer (HCC) 02/2020    ALLERGIES:  is allergic to acyclovir and related.  MEDICATIONS:  Current Outpatient Medications  Medication Sig Dispense Refill   alendronate  (FOSAMAX ) 70 MG tablet Take 1 tablet (70 mg total) by mouth every 7 (seven) days. Take with a full glass of water on an empty stomach. 12 tablet 4   Calcium Carbonate-Vit D-Min (CALTRATE 600+D PLUS MINERALS) 600-800 MG-UNIT TABS Take 1 tablet by mouth in the morning and at bedtime.     LORazepam  (ATIVAN ) 0.5 MG tablet 1 tab po 30 minutes prior to radiation or MRI scans 10 tablet 0   meloxicam  (MOBIC ) 7.5 MG tablet Take 1 tablet (7.5 mg total) by mouth daily. 14 tablet 0   Multiple Vitamin (MULTIVITAMIN) tablet Take 1 tablet by mouth daily.     tamsulosin  (FLOMAX ) 0.4 MG CAPS capsule Take 1 capsule (0.4 mg total) by mouth daily. 90 capsule 1   Zoster Vaccine Adjuvanted (SHINGRIX ) injection 0.5ml IM now and again in 2-6 months 0.5 mL 1   No current facility-administered medications for this visit.    SURGICAL HISTORY:  Past Surgical History:  Procedure Laterality Date   BRONCHIAL BIOPSY  03/22/2020   Procedure: BRONCHIAL BIOPSIES;  Surgeon: Brenna Adine CROME, DO;  Location: [facility];  Service: Pulmonary;;   BRONCHIAL BRUSHINGS  03/22/2020   Procedure: BRONCHIAL BRUSHINGS;  Surgeon: Brenna Adine CROME, DO;  Location:  [facility];  Service: Pulmonary;;   BRONCHIAL NEEDLE ASPIRATION BIOPSY  03/22/2020   Procedure: BRONCHIAL NEEDLE ASPIRATION BIOPSIES;  Surgeon: Brenna Adine CROME, DO;  Location: [facility];  Service: Pulmonary;;   BRONCHIAL WASHINGS  03/22/2020   Procedure: BRONCHIAL WASHINGS;  Surgeon: Brenna Adine CROME, DO;  Location: [facility];  Service: Pulmonary;;   FIDUCIAL MARKER PLACEMENT  03/22/2020   Procedure: FIDUCIAL  MARKER PLACEMENT;  Surgeon: Brenna Adine CROME, DO;  Location: [facility];  Service: Pulmonary;;   NO PAST SURGERIES     ROBOTIC ASSITED PARTIAL NEPHRECTOMY Left 2022   VIDEO BRONCHOSCOPY WITH ENDOBRONCHIAL NAVIGATION N/A 03/22/2020   Procedure: VIDEO BRONCHOSCOPY WITH ENDOBRONCHIAL NAVIGATION;  Surgeon: Brenna Adine CROME, DO;  Location: [facility];  Service: Pulmonary;  Laterality: N/A;   VIDEO BRONCHOSCOPY WITH ENDOBRONCHIAL ULTRASOUND N/A 03/22/2020   Procedure: VIDEO BRONCHOSCOPY WITH ENDOBRONCHIAL ULTRASOUND;  Surgeon: Brenna Adine CROME, DO;  Location: [facility];  Service: Pulmonary;  Laterality: N/A;    REVIEW OF SYSTEMS:  Constitutional: positive for fatigue Eyes: negative Ears, nose, mouth, throat, and face: negative Respiratory: negative Cardiovascular: negative Gastrointestinal: negative Genitourinary:negative Integument/breast: negative Hematologic/lymphatic: negative Musculoskeletal:negative Neurological: negative Behavioral/Psych: negative Endocrine: negative Allergic/Immunologic: negative   PHYSICAL EXAMINATION: General appearance: alert, cooperative, fatigued, and no distress Head: Normocephalic, without obvious abnormality, atraumatic Neck: no adenopathy, no JVD, supple, symmetrical, trachea midline, and thyroid  not enlarged, symmetric, no tenderness/mass/nodules Lymph nodes: Cervical, supraclavicular, and axillary nodes normal. Resp: clear to auscultation bilaterally Back: symmetric, no curvature. ROM normal. No CVA tenderness. Cardio: regular rate and rhythm, S1, S2 normal, no murmur, click, rub or gallop GI: soft, non-tender; bowel sounds normal; no masses,  no organomegaly Extremities: extremities normal, atraumatic, no cyanosis or edema Neurologic: Alert and oriented X 3, normal strength and tone. Normal symmetric reflexes. Normal coordination and gait  ECOG PERFORMANCE STATUS: 1 - Symptomatic but completely ambulatory  Blood pressure 128/63, pulse 88,  temperature 98.1 F (36.7 C), temperature source Temporal, resp. rate 17, height 6' 1 (1.854 m), weight 128 lb (58.1 kg), SpO2 99%.  LABORATORY DATA: Lab Results  Component Value Date   WBC 5.3 10/05/2023   HGB 13.5 10/05/2023   HCT 39.0 10/05/2023   MCV 91.8 10/05/2023   PLT 224 10/05/2023      Chemistry      Component Value Date/Time   NA 128 (L) 10/19/2023 0732   K 4.4 10/19/2023 0732   CL 92 (L) 10/19/2023 0732   CO2 26 10/19/2023 0732   BUN 11 10/19/2023 0732   CREATININE 1.08 10/19/2023 0732   CREATININE 1.05 10/05/2023 0916   CREATININE 0.86 11/12/2011 0917      Component Value Date/Time   CALCIUM 9.3 10/19/2023 0732   ALKPHOS 75 10/05/2023 0916   AST 17 10/05/2023 0916   ALT 11 10/05/2023 0916   BILITOT 0.6 10/05/2023 0916       RADIOGRAPHIC STUDIES: NM PET Image Restage (PS) Skull Base to Thigh (F-18 FDG) Result Date: 10/25/2023 CLINICAL DATA:  Subsequent treatment strategy for small cell lung cancer. EXAM: NUCLEAR MEDICINE PET SKULL BASE TO THIGH TECHNIQUE: 6.2 mCi F-18 FDG was injected intravenously. Full-ring PET imaging was performed from the skull base to thigh after the radiotracer. CT data was obtained and used for attenuation correction and anatomic localization. Fasting blood glucose: 110 mg/dl COMPARISON:  88/04/7973 FINDINGS: Mediastinal blood pool activity: SUV max 1.9 Liver activity: SUV max NA NECK: The patient has known tiny intracranial metastatic lesions based on/28/25 MRI but these are not readily  apparent on PET-CT probably due to their small size. Dental related activity in the right maxilla left mandible. Incidental CT findings: Left common carotid atherosclerotic vascular calcification. CHEST: On the prior exam there is a hypermetabolic right hilar lymph node maximum SUV 6.6, currently this has a maximum SUV of 5.3. New hypermetabolic right paratracheal, AP window, and left hilar adenopathy compatible with active malignancy. This is new compared to  the index anterior para right paratracheal node 1.0 cm in short axis on image 64 series 4, maximum SUV 8.6. Index AP window/left paratracheal lymph node 0.8 cm in short axis on image 72 series 4 with maximum SUV 6.6. The region of architectural distortion next to the fiducial medially in the right upper lobe on image 64 series 4 has a maximum SUV of 1.6, formerly 1.4. Morphologically this appears similar. Incidental CT findings: Emphysema. Atherosclerotic vascular calcification of the thoracic aorta. ABDOMEN/PELVIS: Hyperintensity in the vicinity of the right adrenal gland without a well-defined mass, maximum SUV 5.0 and previously 2.5, suspicious for early metastatic disease to the right adrenal gland. Small nonspecific focus of hypermetabolic activity associated with a 0.6 by 0.2 cm subcutaneous nodule anterior to the right lower ribcage, maximum SUV 2.3, nonspecific. Incidental CT findings: Atherosclerosis is present, including aortoiliac atherosclerotic disease. SKELETON: Focus of hypermetabolic activity in the vicinity of the right T7 pedicle or T7-8 neural foramen/spinal canal, maximum SUV 3.3, no well-defined CT correlate. Focal hypermetabolic activity in the right lateral recess region at L4, questionably in the pedicle, maximum SUV 5.1. Along the posterior margin of the right inferior articular facet of L4 there is hypermetabolic activity without a well-defined CT correlate, maximum SUV 7.0. These are new from the prior exam and greater than I would expect for degenerative activity. Additional very faint foci of activity along the thoracolumbar spinal canal noted. Incidental CT findings: None. IMPRESSION: 1. New hypermetabolic mediastinal and left hilar adenopathy compatible with active malignancy. 2. New hypermetabolic activity in the vicinity of the right adrenal gland without a well-defined mass, suspicious for early metastatic disease to the right adrenal gland. 3. New foci of hypermetabolic activity in  the thoracolumbar spinal canal and potentially along the right T6 and L4 pedicles, without well-defined CT correlate, suspicious for osseous metastatic disease and possibly metastatic lesions in the spinal canal. This could be further characterized with dedicated MRI thoracic and lumbar spine with and without contrast. 4. Aortic Atherosclerosis (ICD10-I70.0) and Emphysema (ICD10-J43.9). Electronically Signed   By: Ryan Salvage M.D.   On: 10/25/2023 13:53   DG Bone Density Result Date: 10/13/2023 EXAM: DUAL X-RAY ABSORPTIOMETRY (DXA) FOR BONE MINERAL DENSITY 10/13/2023 8:36 am CLINICAL DATA:  73 year old Male Screening for osteoporosis TECHNIQUE: An axial (e.g., hips, spine) and/or appendicular (e.g., radius) exam was performed, as appropriate, using GE Secretary/administrator at [employer]. Images are obtained for bone mineral density measurement and are not obtained for diagnostic purposes. [id number] Vertebral fracture assessment was performed. Exclusions: L3. COMPARISON:  None. New baseline. FINDINGS: Scan quality: Good. LUMBAR SPINE (L1-L2, L4): BMD (in g/cm2): 0.933 T-score: -2.0 Z-score: -1.7 LEFT FEMORAL NECK: BMD (in g/cm2): 0.668 T-score: -2.7 Z-score: -1.8 LEFT TOTAL HIP: BMD (in g/cm2): 0.675 T-score: -2.6 Z-score: -2.1 RIGHT FEMORAL NECK: BMD (in g/cm2): 0.700 T-score: -2.4 Z-score: -1.5 RIGHT TOTAL HIP: BMD (in g/cm2): 0.661 T-score: -2.8 Z-score: -2.2 FRAX 10-YEAR PROBABILITY OF FRACTURE: FRAX not reported as the lowest BMD is not in the osteopenia range. IMPRESSION: Osteoporosis based on BMD.  Fracture risk is increased. Increased risk is based on low BMD. RECOMMENDATIONS: 1. All patients should optimize calcium and vitamin D  intake. 2. Consider FDA-approved medical therapies in postmenopausal women and men aged 30 years and older, based on the following: - A hip or vertebral (clinical or morphometric) fracture - T-score less than or equal to -2.5 and secondary causes  have been excluded. - Low bone mass (T-score between -1.0 and -2.5) and a 10-year probability of a hip fracture greater than or equal to 3% or a 10-year probability of a major osteoporosis-related fracture greater than or equal to 20% based on the [place] -adapted WHO algorithm. - Clinician judgment and/or patient preferences may indicate treatment for people with 10-year fracture probabilities above or below these levels 3. Patients with diagnosis of osteoporosis or at high risk for fracture should have regular bone mineral density tests. For patients eligible for Medicare, routine testing is allowed once every 2 years. The testing frequency can be increased to one year for patients who have rapidly progressing disease, those who are receiving or discontinuing medical therapy to restore bone mass, or have additional risk factors. Electronically Signed   By: Reyes Phi M.D.   On: 10/13/2023 11:10   US  Venous Img Lower Unilateral Right Result Date: 10/06/2023 CLINICAL DATA:  right calf pain.  Rule out DVT. EXAM: RIGHT LOWER EXTREMITY VENOUS DOPPLER ULTRASOUND TECHNIQUE: Gray-scale sonography with graded compression, as well as color Doppler and duplex ultrasound were performed to evaluate the lower extremity deep venous systems from the level of the common femoral vein and including the common femoral, femoral, profunda femoral, popliteal and calf veins including the posterior tibial, peroneal and gastrocnemius veins when visible. The superficial great saphenous vein was also interrogated. Spectral Doppler was utilized to evaluate flow at rest and with distal augmentation maneuvers in the common femoral, femoral and popliteal veins. COMPARISON:  None Available. FINDINGS: Common Femoral Vein: No evidence of thrombus. Normal compressibility, respiratory phasicity and response to augmentation. Saphenofemoral Junction: No evidence of thrombus. Normal compressibility and flow on color Doppler imaging. Profunda Femoral Vein:  No evidence of thrombus. Normal compressibility and flow on color Doppler imaging. Femoral Vein: No evidence of thrombus. Normal compressibility, respiratory phasicity and response to augmentation. Popliteal Vein: No evidence of thrombus. Normal compressibility, respiratory phasicity and response to augmentation. Calf Veins: No evidence of thrombus. Normal compressibility and flow on color Doppler imaging. Superficial Great Saphenous Vein: No evidence of thrombus. Normal compressibility. Other Findings:  None. IMPRESSION: Negative for deep venous thrombosis in the right leg. Electronically Signed   By: Rogelia Myers M.D.   On: 10/06/2023 19:31    ASSESSMENT AND PLAN: This is a very pleasant 73 years old white male diagnosed with poorly differentiated carcinoma consistent with adenocarcinoma in the right upper lobe.  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  The patient also has a suspicious left renal lesion concerning for renal cell carcinoma. He is status post a course of systemic chemotherapy initially with cisplatin  and etoposide  but the cisplatin  was discontinued secondary to renal insufficiency and the patient continued 3 more cycles of his systemic chemotherapy with carboplatin  and etoposide  concurrent with radiation.   He had evidence for disease recurrence with metastatic brain lesions in addition to right hilar lymphadenopathy treated with SRS as well as palliative radiotherapy to the right hilar area. He had a PET scan performed recently that showed evidence for disease progression in the mediastinal lymph  nodes as well as right adrenal metastasis. 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 Previous treatment included systemic chemotherapy with cisplatin  and etoposide  concurrent with radiation, completed in May 2022. Recent PET scan  shows hypermetabolic activity in lymph nodes in the middle of the chest and a suspicious spot in the right adrenal gland. Biopsy needed to confirm recurrence and differentiate from adenocarcinoma. - Order biopsy of lymph node in the chest to confirm diagnosis and differentiate between small cell lung cancer and adenocarcinoma. - If confirmed as small cell lung cancer, plan to restart chemotherapy with cisplatin  and etoposide  and add immunotherapy. - Order port placement for chemotherapy administration.  Recurrent adenocarcinoma of lung Recurrent adenocarcinoma of the lung is a differential diagnosis due to the presence of two types of cancer initially diagnosed in December 2021. Treatment plan depends on biopsy results to confirm if adenocarcinoma is the cause of current disease progression. - Await biopsy results to confirm if adenocarcinoma is present. - If confirmed as adenocarcinoma, plan to initiate chemotherapy and immunotherapy with a different regimen than for small cell lung cancer.  Suspicious adrenal gland lesion Suspicious lesion in the right adrenal gland identified on PET scan, potentially indicative of metastatic disease. Further evaluation needed to determine the nature of the lesion. - Include adrenal gland lesion in the assessment during biopsy to determine if it is metastatic disease. The patient was advised to call immediately if he has any concerning symptoms in the interval.  The patient voices understanding of current disease status and treatment options and is in agreement with the current care plan.  All questions were answered. The patient knows to call the clinic with any problems, questions or concerns. We can certainly see the patient much sooner if necessary. The total time spent in the appointment was 30 minutes including review of chart and various tests results, discussions about plan of care and coordination of care plan .   Disclaimer: This note was dictated  with voice recognition software. Similar sounding words can inadvertently be transcribed and may not be corrected upon review.

## 2023-10-29 NOTE — Telephone Encounter (Signed)
 Called patient's wife- Joen  to inform of MRI for 10-30-23- arrival time- 1:45 pm @ [facility], no restrictions to scan, lvm for a return call

## 2023-10-29 NOTE — Telephone Encounter (Signed)
Scheduled appointments with the patient

## 2023-10-30 ENCOUNTER — Ambulatory Visit (HOSPITAL_COMMUNITY)
Admission: RE | Admit: 2023-10-30 | Discharge: 2023-10-30 | Disposition: A | Discharge status: Home / Self Care | Source: Ambulatory Visit | Attending: Radiation Oncology | Admitting: Radiation Oncology

## 2023-10-30 DIAGNOSIS — C3411 Malignant neoplasm of upper lobe, right bronchus or lung: Secondary | ICD-10-CM | POA: Insufficient documentation

## 2023-10-30 MED ORDER — GADOBUTROL 1 MMOL/ML IV SOLN
6.0000 mL | Freq: Once | INTRAVENOUS | Status: AC | PRN
  Administered 2023-10-30: 6 mL via INTRAVENOUS

## 2023-10-31 ENCOUNTER — Other Ambulatory Visit: Payer: Self-pay | Admitting: Radiation Oncology

## 2023-10-31 ENCOUNTER — Encounter: Payer: Self-pay | Admitting: Radiation Oncology

## 2023-10-31 MED ORDER — DEXAMETHASONE 4 MG PO TABS: 4.0000 mg | ORAL_TABLET | Freq: Two times a day (BID) | ORAL | 1 refills | Status: DC

## 2023-11-02 ENCOUNTER — Telehealth: Payer: Self-pay

## 2023-11-02 ENCOUNTER — Telehealth: Payer: Self-pay | Admitting: Radiation Oncology

## 2023-11-02 ENCOUNTER — Other Ambulatory Visit: Payer: Self-pay | Admitting: Radiation Oncology

## 2023-11-02 ENCOUNTER — Ambulatory Visit (HOSPITAL_COMMUNITY)

## 2023-11-02 ENCOUNTER — Telehealth: Payer: Self-pay | Admitting: Emergency Medicine

## 2023-11-02 ENCOUNTER — Other Ambulatory Visit: Payer: Self-pay

## 2023-11-02 ENCOUNTER — Encounter: Payer: Self-pay | Admitting: Emergency Medicine

## 2023-11-02 DIAGNOSIS — C7931 Secondary malignant neoplasm of brain: Secondary | ICD-10-CM

## 2023-11-02 DIAGNOSIS — C3411 Malignant neoplasm of upper lobe, right bronchus or lung: Secondary | ICD-10-CM

## 2023-11-02 DIAGNOSIS — C3491 Malignant neoplasm of unspecified part of right bronchus or lung: Secondary | ICD-10-CM

## 2023-11-02 DIAGNOSIS — R590 Localized enlarged lymph nodes: Secondary | ICD-10-CM | POA: Insufficient documentation

## 2023-11-02 DIAGNOSIS — C3490 Malignant neoplasm of unspecified part of unspecified bronchus or lung: Secondary | ICD-10-CM

## 2023-11-02 NOTE — Telephone Encounter (Signed)
 Patient aware of  appt I have cancelled the appts that were with Dr Tamea on 7/29 and the New pt appt with Dr Shelah on 11/19/23 will send to [facility] to check if Shara is needed  Case [id number]

## 2023-11-02 NOTE — Telephone Encounter (Signed)
 I got the patient scheduled for 11/10/23

## 2023-11-02 NOTE — Telephone Encounter (Signed)
 I called and spoke with the patient. His scans were reviewed this morning in brain and spine oncology conference and are concerning for enhancement of the L5 spinal nerve root as well as of the spinal cord at the level of L1. He additionally has disease in the vertebral body of the T5 level. Overall this pattern in the spinal cord and nerve root are suspicious for leptomeningeal disease, and recommendations were to move up his MRI of the brain and also image the cervical spine with MRI and perform a lumbar puncture with CSF cytology to determine next steps. He is in agreement to proceed and asked if he could get in to see Dr. Shelah for bronch sooner than going to a provider he wasn't familiar with in [place].

## 2023-11-02 NOTE — Telephone Encounter (Signed)
 I reviewed the patient's case with Ian Hart with radiation oncology.  He has a history of mixed cytology lung cancer, adenocarcinoma/small cell.  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  The goal is to rapidly expedite his tissue diagnosis to assess for molecular markers and hopefully offer therapeutic options. I will work on getting him scheduled for EBUS ASAP, probably on 7/28 or 11/10/2023.   Please schedule the following:  Provider performing procedure: Tylek Boney Diagnosis: Mediastinal and hilar adenopathy Which side if for nodule / mass?  Bilateral Procedure: Endobronchial ultrasound Has patient been spoken to by Provider and given informed consent?  Yes Anesthesia: General Do you need Fluro?  No Duration of procedure: 45 minutes Date: 11/10/2023 Alternate Date: 11/09/2023 Time: Any Location: [facility] Does patient have OSA?  No DM?  NoNo Or Latex allergy? No Medication Restriction/ Anticoagulate/Antiplatelet: None Pre-op Labs Ordered:determined by Anesthesia Imaging request: None (If, SuperDimension CT Chest, please have STAT courier sent to ENDO)

## 2023-11-02 NOTE — Telephone Encounter (Signed)
 Called patient with information about MRI's scheduled for today. Pt expressed understanding. Gave patient my contact information for further needs.

## 2023-11-03 ENCOUNTER — Other Ambulatory Visit: Payer: Self-pay | Admitting: Radiation Therapy

## 2023-11-03 ENCOUNTER — Ambulatory Visit (HOSPITAL_COMMUNITY)
Admission: RE | Admit: 2023-11-03 | Discharge: 2023-11-03 | Disposition: A | Discharge status: Home / Self Care | Source: Ambulatory Visit | Attending: Radiation Oncology | Admitting: Radiation Oncology

## 2023-11-03 DIAGNOSIS — C7931 Secondary malignant neoplasm of brain: Secondary | ICD-10-CM | POA: Diagnosis present

## 2023-11-03 DIAGNOSIS — C3490 Malignant neoplasm of unspecified part of unspecified bronchus or lung: Secondary | ICD-10-CM | POA: Insufficient documentation

## 2023-11-03 MED ORDER — GADOBUTROL 1 MMOL/ML IV SOLN
5.5000 mL | Freq: Once | INTRAVENOUS | Status: AC | PRN
  Administered 2023-11-03: 5.5 mL via INTRAVENOUS

## 2023-11-04 ENCOUNTER — Other Ambulatory Visit: Payer: Self-pay | Admitting: Radiology

## 2023-11-04 ENCOUNTER — Telehealth: Payer: Self-pay | Admitting: Radiation Oncology

## 2023-11-04 NOTE — H&P (Signed)
 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 male ex-smoker with past medical history significant for poorly differentiated carcinoma/adenocarcinoma right lung in addition to small cell lung cancer diagnosed in 2021, status post chemoradiation.  He now has evidence of disease recurrence with metastatic brain lesions in addition to right hilar lymphadenopathy treated with SRS as well as palliative radiotherapy to the right hilar area.  Recent PET scan shows evidence for disease progression in the mediastinal lymph nodes/thoracolumbar spinal canal as well as a right adrenal region.  He presents today for Port-A-Cath placement to assist with treatment as well as lumbar puncture to evaluate for leptomeningeal disease.  *** Patient is Full Code  Past Medical History:  Diagnosis Date   History of chicken pox    History of shingles 04/2010   Lung cancer (HCC) 02/2020    Past Surgical History:  Procedure Laterality Date   BRONCHIAL BIOPSY  03/22/2020   Procedure: BRONCHIAL BIOPSIES;  Surgeon: Brenna Adine CROME, DO;  Location: [facility];  Service: Pulmonary;;   BRONCHIAL BRUSHINGS  03/22/2020   Procedure: BRONCHIAL BRUSHINGS;  Surgeon: Brenna Adine CROME, DO;  Location: [facility];  Service: Pulmonary;;   BRONCHIAL NEEDLE ASPIRATION BIOPSY  03/22/2020   Procedure: BRONCHIAL NEEDLE ASPIRATION BIOPSIES;  Surgeon: Brenna Adine CROME, DO;  Location: [facility];  Service: Pulmonary;;   BRONCHIAL WASHINGS  03/22/2020   Procedure: BRONCHIAL WASHINGS;  Surgeon: Brenna Adine CROME, DO;  Location: [facility];  Service: Pulmonary;;   FIDUCIAL MARKER  PLACEMENT  03/22/2020   Procedure: FIDUCIAL MARKER PLACEMENT;  Surgeon: Brenna Adine CROME, DO;  Location: [facility];  Service: Pulmonary;;   NO PAST SURGERIES     ROBOTIC ASSITED PARTIAL NEPHRECTOMY Left 2022   VIDEO BRONCHOSCOPY WITH ENDOBRONCHIAL NAVIGATION N/A 03/22/2020   Procedure: VIDEO BRONCHOSCOPY WITH ENDOBRONCHIAL NAVIGATION;  Surgeon: Brenna Adine CROME, DO;  Location: [facility];  Service: Pulmonary;  Laterality: N/A;   VIDEO BRONCHOSCOPY WITH ENDOBRONCHIAL ULTRASOUND N/A 03/22/2020   Procedure: VIDEO BRONCHOSCOPY WITH ENDOBRONCHIAL ULTRASOUND;  Surgeon: Brenna Adine CROME, DO;  Location: [facility];  Service: Pulmonary;  Laterality: N/A;    Allergies: Acyclovir and related  Medications: Prior to Admission medications   Medication Sig Start Date End Date Taking? Authorizing Provider  alendronate  (FOSAMAX ) 70 MG tablet Take 1 tablet (70 mg total) by mouth every 7 (seven) days. Take with a full glass of water on an empty stomach. 10/13/23   O'Sullivan, Melissa, NP  Calcium Carbonate-Vit D-Min (CALTRATE 600+D PLUS MINERALS) 600-800 MG-UNIT TABS Take 1 tablet by mouth in the morning and at bedtime. 10/13/23   O'Sullivan, Melissa, NP  dexamethasone  (DECADRON ) 4 MG tablet Take 1 tablet (4 mg total) by mouth 2 (two) times daily. 10/31/23   Lanell Donald Stagger, PA-C  LORazepam  (ATIVAN ) 0.5 MG tablet 1 tab po 30 minutes prior to radiation or MRI scans 06/02/23   Lanell Donald Stagger, PA-C  meloxicam  (MOBIC ) 7.5 MG tablet Take 1 tablet (7.5 mg total) by mouth daily. 10/06/23   O'Sullivan, Melissa, NP  Multiple Vitamin (MULTIVITAMIN) tablet Take 1 tablet by mouth daily.    [provider]  tamsulosin  (FLOMAX ) 0.4 MG CAPS capsule Take 1 capsule (0.4 mg total) by mouth daily. 10/23/23   Daryl,  Melissa, NP  Zoster Vaccine Adjuvanted (SHINGRIX ) injection 0.5ml IM now and again in 2-6 months 07/30/22   [name], NP     Family History  Problem Relation Age of Onset    Sudden death Mother    Rheumatic fever Mother    COPD Brother    Liver disease Neg Hx    Colon cancer Neg Hx    Esophageal cancer Neg Hx     Social History   Socioeconomic History   Marital status: Married    Spouse name: Not on file   Number of children: 1   Years of education: Not on file   Highest education level: Associate degree: academic program  Occupational History   Occupation: retired  Tobacco Use   Smoking status: Former    Current packs/day: 0.00    Average packs/day: 2.0 packs/day for 50.0 years (100.0 ttl pk-yrs)    Types: Cigarettes    Quit date: 07/14/2022    Years since quitting: 1.3   Smokeless tobacco: Never   Tobacco comments:    07/25/20  Vaping Use   Vaping status: Never Used  Substance and Sexual Activity   Alcohol use: Yes    Alcohol/week: 5.0 standard drinks of alcohol    Types: 3 Cans of beer, 2 Shots of liquor per week    Comment: daily   Drug use: No   Sexual activity: Yes  Other Topics Concern   Not on file  Social History Narrative   Regular exercise:  2-3 x weekly (sports, yardwork)   Caffeine use:  3 cups coffee daily   3 yr old son   Wife   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           Social Drivers of Corporate investment banker Strain: Low Risk  (08/07/2023)   Overall Financial Resource Strain (CARDIA)    Difficulty of Paying Living Expenses: Not hard at all  Food Insecurity: No Food Insecurity (09/11/2023)   Hunger Vital Sign    Worried About Running Out of Food in the Last Year: Never true    Ran Out of Food in the Last Year: Never true  Transportation Needs: No Transportation Needs (09/11/2023)   PRAPARE - Administrator, Civil Service (Medical): No    Lack of Transportation (Non-Medical): No  Physical Activity: Unknown (10/05/2023)   Exercise Vital Sign    Days of Exercise per Week: 6 days    Minutes of Exercise per Session: Not on file  Stress: Stress  Concern Present (08/07/2023)   [employer] of Occupational Health - Occupational Stress Questionnaire    Feeling of Stress : To some extent  Social Connections: Socially Integrated (08/07/2023)   Social Connection and Isolation Panel    Frequency of Communication with Friends and Family: More than three times a week    Frequency of Social Gatherings with Friends and Family: Once a week    Attends Religious Services: More than 4 times per year    Active Member of [employer] or Organizations: Yes    Attends Engineer, structural: More than 4 times per year    Marital Status: Married       Review of Systems  Vital Signs:   Advance Care Plan: No documents on file.  Physical Exam  Imaging: MR Brain W Wo Contrast Result Date: 11/03/2023 CLINICAL DATA:  Brain/CNS neoplasm, staging 3T SRS for Treatment planning EXAM: MRI HEAD  WITHOUT AND WITH CONTRAST TECHNIQUE: Multiplanar, multiecho pulse sequences of the brain and surrounding structures were obtained without and with intravenous contrast. CONTRAST:  5.5mL GADAVIST  GADOBUTROL  1 MMOL/ML IV SOLN COMPARISON:  MRI of the brain dated Sep 09, 2023. FINDINGS: Brain: All of the metastatic lesions previously noted within the cerebral and cerebellar hemispheres have significantly improved or completely resolved. There are no new lesions present. The residual lesions within the cerebellar hemispheres can be seen on images 54, 64, 73, 83, 92 and 103 of series 2053. The cerebral metastases can be seen on images 110, 137, 146, 204, 245, 248 and 271. There is moderate generalized cerebral volume loss. There is mild to moderate periventricular and deep cerebral white matter disease. There is no evidence of hemorrhage or hydrocephalus. Vascular: Normal vascular flow voids. Skull and upper cervical spine: Normal marrow signal. No osseous lesions. Sinuses/Orbits: Clear paranasal sinuses and mastoid air cells. Normal orbits. Other: None. IMPRESSION: 1. All  of the previously demonstrated metastatic lesions within the cerebral and cerebellar hemispheres have either significantly improved or resolved. There are no new lesions evident. Electronically Signed   By: Evalene Coho M.D.   On: 11/03/2023 16:42   MR CERVICAL SPINE W WO CONTRAST Result Date: 11/03/2023 CLINICAL DATA:  Metastatic small cell lung cancer. EXAM: MRI CERVICAL SPINE WITHOUT AND WITH CONTRAST TECHNIQUE: Multiplanar and multiecho pulse sequences of the cervical spine, to include the craniocervical junction and cervicothoracic junction, were obtained without and with intravenous contrast. CONTRAST:  5.5mL GADAVIST  GADOBUTROL  1 MMOL/ML IV SOLN COMPARISON:  None Available. FINDINGS: Alignment: Anatomic. Vertebrae: The cervical vertebrae maintain their height and alignment and are mildly heterogeneous in signal intensity. No discrete lesions are present. There is a questionable lesion within the T2 vertebral body, which is hypointense on T1 and enhances. There is no evidence of fracture. Cord: Normal in morphology and signal intensity. No abnormal enhancement. Posterior Fossa, vertebral arteries, paraspinal tissues: Negative. Disc levels: There is chronic degenerative disc disease with central spinal canal stenosis and bilateral neural foraminal stenosis at C4-5, C5-6 and C6-7. There is no definite spinal cord or nerve root impingement. IMPRESSION: 1. Questionable metastatic lesion within the T2 vertebral body. 2. Mild central spinal canal stenosis and bilateral neural foraminal stenosis at C4-5, C5-6 and C6-7. Electronically Signed   By: Evalene Coho M.D.   On: 11/03/2023 13:55   MR THORACIC SPINE W WO CONTRAST Result Date: 10/30/2023 EXAM: MRI THORACIC AND LUMBAR SPINE WITH AND WITHOUT INTRAVENOUS CONTRAST 10/30/2023 03:20:29 PM TECHNIQUE: Multiplanar multisequence MRI of the thoracic and lumbar spine was performed with and without the administration of intravenous contrast. COMPARISON:  PET/CT 10/22/2023 CLINICAL HISTORY: Metastatic disease evaluation. Small cell carcinoma of upper lobe of right lung. FINDINGS: BONES AND ALIGNMENT: Dextrocurvature of the thoracic spine. No listhesis. Transitional lumbosacral anatomy with 6 non-rib-bearing, lumbar type vertebral bodies. Edema and faint enhancement within the posterior aspect of the T5 vertebral body extending into the right pedicle seen on sagittal image 13 of series 17, 18, 19 and 24, suspicious for possible metastatic disease. No suspicious marrow lesions. SPINAL CORD: Focal enhancement along the left aspect of the spinal cord at the L1 level best seen on axial image 46 series 23 and sagittal image 15 series 24, suspicious for metastasis along a nerve root. SOFT TISSUES: Faint enhancement within the perineural cyst in the right neural foramen at T7-8 seen on sagittal image 3 series 24, which may be inflammatory or neoplastic in etiology. THORACIC DISC LEVELS: No significant  disc herniation. No spinal canal stenosis or neural foraminal narrowing. LUMBAR DISC LEVELS: L1-L2: No significant disc herniation. No spinal canal stenosis or neural foraminal narrowing. L2-L3: No significant disc herniation. No spinal canal stenosis or neural foraminal narrowing. L3-L4: No significant disc herniation. No spinal canal stenosis or neural foraminal narrowing. L4-L5: Right eccentric disc bulge and facet arthropathy result in severe narrowing of the right lateral recess with mass effect on the traversing right L5 nerve root in the subarticular zone. Associated edema and enhancement of the right L5 nerve root may be inflammatory or neoplastic in etiology. L5-S1: Degenerative endplate marrow edema along the right aspect of the L5-S1 disc space. IMPRESSION: 1. Edema and faint enhancement within the posterior aspect of the T5 vertebral body extending into the right pedicle, suspicious for possible metastatic disease. 2. Faint enhancement within the perineural cyst in  the right neural foramen at T7-8 seen on sagittal image 3 series 24, which may be inflammatory or neoplastic in etiology. 3. Focal enhancement along the left aspect of the spinal cord at the L1 level, suspicious for metastasis along a nerve root. 4. Severe narrowing of the right lateral recess at L4-5 with mass effect on the traversing right L5 nerve root in the subarticular zone, with associated edema and enhancement of the right L5 nerve root, possibly inflammatory or neoplastic in etiology. Electronically signed by: Ryan Chess MD 10/30/2023 04:05 PM EDT RP Workstation: [id number]   MR Lumbar Spine W Wo Contrast Result Date: 10/30/2023 EXAM: MRI THORACIC AND LUMBAR SPINE WITH AND WITHOUT INTRAVENOUS CONTRAST 10/30/2023 03:20:29 PM TECHNIQUE: Multiplanar multisequence MRI of the thoracic and lumbar spine was performed with and without the administration of intravenous contrast. COMPARISON: PET/CT 10/22/2023 CLINICAL HISTORY: Metastatic disease evaluation. Small cell carcinoma of upper lobe of right lung. FINDINGS: BONES AND ALIGNMENT: Dextrocurvature of the thoracic spine. No listhesis. Transitional lumbosacral anatomy with 6 non-rib-bearing, lumbar type vertebral bodies. Edema and faint enhancement within the posterior aspect of the T5 vertebral body extending into the right pedicle seen on sagittal image 13 of series 17, 18, 19 and 24, suspicious for possible metastatic disease. No suspicious marrow lesions. SPINAL CORD: Focal enhancement along the left aspect of the spinal cord at the L1 level best seen on axial image 46 series 23 and sagittal image 15 series 24, suspicious for metastasis along a nerve root. SOFT TISSUES: Faint enhancement within the perineural cyst in the right neural foramen at T7-8 seen on sagittal image 3 series 24, which may be inflammatory or neoplastic in etiology. THORACIC DISC LEVELS: No significant disc herniation. No spinal canal stenosis or neural foraminal narrowing. LUMBAR  DISC LEVELS: L1-L2: No significant disc herniation. No spinal canal stenosis or neural foraminal narrowing. L2-L3: No significant disc herniation. No spinal canal stenosis or neural foraminal narrowing. L3-L4: No significant disc herniation. No spinal canal stenosis or neural foraminal narrowing. L4-L5: Right eccentric disc bulge and facet arthropathy result in severe narrowing of the right lateral recess with mass effect on the traversing right L5 nerve root in the subarticular zone. Associated edema and enhancement of the right L5 nerve root may be inflammatory or neoplastic in etiology. L5-S1: Degenerative endplate marrow edema along the right aspect of the L5-S1 disc space. IMPRESSION: 1. Edema and faint enhancement within the posterior aspect of the T5 vertebral body extending into the right pedicle, suspicious for possible metastatic disease. 2. Faint enhancement within the perineural cyst in the right neural foramen at T7-8 seen on sagittal image 3 series 24,  which may be inflammatory or neoplastic in etiology. 3. Focal enhancement along the left aspect of the spinal cord at the L1 level, suspicious for metastasis along a nerve root. 4. Severe narrowing of the right lateral recess at L4-5 with mass effect on the traversing right L5 nerve root in the subarticular zone, with associated edema and enhancement of the right L5 nerve root, possibly inflammatory or neoplastic in etiology. Electronically signed by: Ryan Chess MD 10/30/2023 04:05 PM EDT RP Workstation: [id number]   NM PET Image Restage (PS) Skull Base to Thigh (F-18 FDG) Result Date: 10/25/2023 CLINICAL DATA:  Subsequent treatment strategy for small cell lung cancer. EXAM: NUCLEAR MEDICINE PET SKULL BASE TO THIGH TECHNIQUE: 6.2 mCi F-18 FDG was injected intravenously. Full-ring PET imaging was performed from the skull base to thigh after the radiotracer. CT data was obtained and used for attenuation correction and anatomic localization. Fasting  blood glucose: 110 mg/dl COMPARISON:  88/04/7973 FINDINGS: Mediastinal blood pool activity: SUV max 1.9 Liver activity: SUV max NA NECK: The patient has known tiny intracranial metastatic lesions based on/28/25 MRI but these are not readily apparent on PET-CT probably due to their small size. Dental related activity in the right maxilla left mandible. Incidental CT findings: Left common carotid atherosclerotic vascular calcification. CHEST: On the prior exam there is a hypermetabolic right hilar lymph node maximum SUV 6.6, currently this has a maximum SUV of 5.3. New hypermetabolic right paratracheal, AP window, and left hilar adenopathy compatible with active malignancy. This is new compared to the index anterior para right paratracheal node 1.0 cm in short axis on image 64 series 4, maximum SUV 8.6. Index AP window/left paratracheal lymph node 0.8 cm in short axis on image 72 series 4 with maximum SUV 6.6. The region of architectural distortion next to the fiducial medially in the right upper lobe on image 64 series 4 has a maximum SUV of 1.6, formerly 1.4. Morphologically this appears similar. Incidental CT findings: Emphysema. Atherosclerotic vascular calcification of the thoracic aorta. ABDOMEN/PELVIS: Hyperintensity in the vicinity of the right adrenal gland without a well-defined mass, maximum SUV 5.0 and previously 2.5, suspicious for early metastatic disease to the right adrenal gland. Small nonspecific focus of hypermetabolic activity associated with a 0.6 by 0.2 cm subcutaneous nodule anterior to the right lower ribcage, maximum SUV 2.3, nonspecific. Incidental CT findings: Atherosclerosis is present, including aortoiliac atherosclerotic disease. SKELETON: Focus of hypermetabolic activity in the vicinity of the right T7 pedicle or T7-8 neural foramen/spinal canal, maximum SUV 3.3, no well-defined CT correlate. Focal hypermetabolic activity in the right lateral recess region at L4, questionably in the  pedicle, maximum SUV 5.1. Along the posterior margin of the right inferior articular facet of L4 there is hypermetabolic activity without a well-defined CT correlate, maximum SUV 7.0. These are new from the prior exam and greater than I would expect for degenerative activity. Additional very faint foci of activity along the thoracolumbar spinal canal noted. Incidental CT findings: None. IMPRESSION: 1. New hypermetabolic mediastinal and left hilar adenopathy compatible with active malignancy. 2. New hypermetabolic activity in the vicinity of the right adrenal gland without a well-defined mass, suspicious for early metastatic disease to the right adrenal gland. 3. New foci of hypermetabolic activity in the thoracolumbar spinal canal and potentially along the right T6 and L4 pedicles, without well-defined CT correlate, suspicious for osseous metastatic disease and possibly metastatic lesions in the spinal canal. This could be further characterized with dedicated MRI thoracic and lumbar spine with and  without contrast. 4. Aortic Atherosclerosis (ICD10-I70.0) and Emphysema (ICD10-J43.9). Electronically Signed   By: Ryan Salvage M.D.   On: 10/25/2023 13:53   DG Bone Density Result Date: 10/13/2023 EXAM: DUAL X-RAY ABSORPTIOMETRY (DXA) FOR BONE MINERAL DENSITY 10/13/2023 8:36 am CLINICAL DATA:  73 year old Male Screening for osteoporosis TECHNIQUE: An axial (e.g., hips, spine) and/or appendicular (e.g., radius) exam was performed, as appropriate, using GE Secretary/administrator at [employer]. Images are obtained for bone mineral density measurement and are not obtained for diagnostic purposes. [id number] Vertebral fracture assessment was performed. Exclusions: L3. COMPARISON:  None. New baseline. FINDINGS: Scan quality: Good. LUMBAR SPINE (L1-L2, L4): BMD (in g/cm2): 0.933 T-score: -2.0 Z-score: -1.7 LEFT FEMORAL NECK: BMD (in g/cm2): 0.668 T-score: -2.7 Z-score: -1.8 LEFT TOTAL HIP: BMD  (in g/cm2): 0.675 T-score: -2.6 Z-score: -2.1 RIGHT FEMORAL NECK: BMD (in g/cm2): 0.700 T-score: -2.4 Z-score: -1.5 RIGHT TOTAL HIP: BMD (in g/cm2): 0.661 T-score: -2.8 Z-score: -2.2 FRAX 10-YEAR PROBABILITY OF FRACTURE: FRAX not reported as the lowest BMD is not in the osteopenia range. IMPRESSION: Osteoporosis based on BMD. Fracture risk is increased. Increased risk is based on low BMD. RECOMMENDATIONS: 1. All patients should optimize calcium and vitamin D  intake. 2. Consider FDA-approved medical therapies in postmenopausal women and men aged 42 years and older, based on the following: - A hip or vertebral (clinical or morphometric) fracture - T-score less than or equal to -2.5 and secondary causes have been excluded. - Low bone mass (T-score between -1.0 and -2.5) and a 10-year probability of a hip fracture greater than or equal to 3% or a 10-year probability of a major osteoporosis-related fracture greater than or equal to 20% based on the [place] -adapted WHO algorithm. - Clinician judgment and/or patient preferences may indicate treatment for people with 10-year fracture probabilities above or below these levels 3. Patients with diagnosis of osteoporosis or at high risk for fracture should have regular bone mineral density tests. For patients eligible for Medicare, routine testing is allowed once every 2 years. The testing frequency can be increased to one year for patients who have rapidly progressing disease, those who are receiving or discontinuing medical therapy to restore bone mass, or have additional risk factors. Electronically Signed   By: Reyes Phi M.D.   On: 10/13/2023 11:10   US  Venous Img Lower Unilateral Right Result Date: 10/06/2023 CLINICAL DATA:  right calf pain.  Rule out DVT. EXAM: RIGHT LOWER EXTREMITY VENOUS DOPPLER ULTRASOUND TECHNIQUE: Gray-scale sonography with graded compression, as well as color Doppler and duplex ultrasound were performed to evaluate the lower extremity deep venous  systems from the level of the common femoral vein and including the common femoral, femoral, profunda femoral, popliteal and calf veins including the posterior tibial, peroneal and gastrocnemius veins when visible. The superficial great saphenous vein was also interrogated. Spectral Doppler was utilized to evaluate flow at rest and with distal augmentation maneuvers in the common femoral, femoral and popliteal veins. COMPARISON:  None Available. FINDINGS: Common Femoral Vein: No evidence of thrombus. Normal compressibility, respiratory phasicity and response to augmentation. Saphenofemoral Junction: No evidence of thrombus. Normal compressibility and flow on color Doppler imaging. Profunda Femoral Vein: No evidence of thrombus. Normal compressibility and flow on color Doppler imaging. Femoral Vein: No evidence of thrombus. Normal compressibility, respiratory phasicity and response to augmentation. Popliteal Vein: No evidence of thrombus. Normal compressibility, respiratory phasicity and response to augmentation. Calf Veins: No evidence of thrombus. Normal compressibility and flow on  color Doppler imaging. Superficial Great Saphenous Vein: No evidence of thrombus. Normal compressibility. Other Findings:  None. IMPRESSION: Negative for deep venous thrombosis in the right leg. Electronically Signed   By: Rogelia Myers M.D.   On: 10/06/2023 19:31    Labs:  CBC: Recent Labs    02/12/23 1018 06/04/23 0930 10/05/23 0916  WBC 5.8 6.4 5.3  HGB 12.9* 12.9* 13.5  HCT 37.0* 38.6* 39.0  PLT 180 208 224    COAGS: No results for input(s): INR, APTT in the last 8760 hours.  BMP: Recent Labs    02/12/23 1018 06/04/23 0930 09/18/23 1418 10/05/23 0916 10/06/23 1008 10/19/23 0732  NA 128* 134* 127* 128* 125* 128*  K 4.1 4.7 4.1 4.4 4.3 4.4  CL 95* 99 94* 95* 92* 92*  CO2 28 28 23 26 28 26   GLUCOSE 106* 118* 85 95 100* 119*  BUN 12 13 11 16 14 11   CALCIUM 9.4 9.3 9.4 9.6 9.5 9.3  CREATININE 1.10  1.15 1.07 1.05 0.99 1.08  GFRNONAA >60 >60 >60 >60  --   --     LIVER FUNCTION TESTS: Recent Labs    02/12/23 1018 06/04/23 0930 09/18/23 1418 10/05/23 0916  BILITOT 0.6 0.6 0.5 0.6  AST 17 19 21 17   ALT 12 11 13 11   ALKPHOS 71 70 84 75  PROT 6.9 7.1 7.2 7.4  ALBUMIN 4.3 4.5 4.4 4.6    TUMOR MARKERS: No results for input(s): AFPTM, CEA, CA199, CHROMGRNA in the last 8760 hours.  Assessment and Plan: 73 y.o. male ex-smoker with past medical history significant for poorly differentiated carcinoma/adenocarcinoma right lung in addition to small cell lung cancer diagnosed in 2021, status post chemoradiation.  He now has evidence of disease recurrence with metastatic brain lesions in addition to right hilar lymphadenopathy treated with SRS as well as palliative radiotherapy to the right hilar area.  Recent PET scan shows evidence for disease progression in the mediastinal lymph nodes/thoracolumbar spinal canal as well as a right adrenal region.  He presents today for Port-A-Cath placement to assist with treatment as well as lumbar puncture to evaluate for leptomeningeal disease.Risks and benefits of image guided port-a-catheter placement/lumbar puncture was discussed with the patient including, but not limited to bleeding, infection, pneumothorax, spinal headache or fibrin sheath development and need for additional procedures.  All of the patient's questions were answered, patient is agreeable to proceed. Consent signed and in chart.    Thank you for allowing our service to participate in MORIAH LOUGHRY 's care.  Electronically Signed: D. Franky Rakers, PA-C   11/04/2023, 3:19 PM      I spent a total of  30 minutes   in face to face in clinical consultation, greater than 50% of which was counseling/coordinating care for Port-A-Cath placement/lumbar puncture

## 2023-11-04 NOTE — Telephone Encounter (Signed)
 I called and let the patient know about his MRI brain and cervical spine results. We will wait for his cytology results

## 2023-11-05 ENCOUNTER — Other Ambulatory Visit: Payer: Self-pay | Admitting: Radiation Oncology

## 2023-11-05 ENCOUNTER — Encounter (HOSPITAL_COMMUNITY): Payer: Self-pay

## 2023-11-05 ENCOUNTER — Ambulatory Visit (HOSPITAL_COMMUNITY)
Admission: RE | Admit: 2023-11-05 | Discharge: 2023-11-05 | Disposition: A | Discharge status: Home / Self Care | Source: Ambulatory Visit | Attending: Internal Medicine | Admitting: Internal Medicine

## 2023-11-05 ENCOUNTER — Other Ambulatory Visit: Payer: Self-pay

## 2023-11-05 ENCOUNTER — Ambulatory Visit (HOSPITAL_COMMUNITY)
Admission: RE | Admit: 2023-11-05 | Discharge: 2023-11-05 | Disposition: A | Discharge status: Home / Self Care | Source: Ambulatory Visit | Attending: Radiation Oncology | Admitting: Radiation Oncology

## 2023-11-05 DIAGNOSIS — C3411 Malignant neoplasm of upper lobe, right bronchus or lung: Secondary | ICD-10-CM | POA: Diagnosis not present

## 2023-11-05 DIAGNOSIS — R590 Localized enlarged lymph nodes: Secondary | ICD-10-CM | POA: Diagnosis present

## 2023-11-05 DIAGNOSIS — Z85118 Personal history of other malignant neoplasm of bronchus and lung: Secondary | ICD-10-CM | POA: Insufficient documentation

## 2023-11-05 DIAGNOSIS — Z87891 Personal history of nicotine dependence: Secondary | ICD-10-CM | POA: Insufficient documentation

## 2023-11-05 DIAGNOSIS — C3491 Malignant neoplasm of unspecified part of right bronchus or lung: Secondary | ICD-10-CM

## 2023-11-05 DIAGNOSIS — Z452 Encounter for adjustment and management of vascular access device: Secondary | ICD-10-CM | POA: Insufficient documentation

## 2023-11-05 DIAGNOSIS — Z923 Personal history of irradiation: Secondary | ICD-10-CM | POA: Insufficient documentation

## 2023-11-05 DIAGNOSIS — C7931 Secondary malignant neoplasm of brain: Secondary | ICD-10-CM | POA: Insufficient documentation

## 2023-11-05 DIAGNOSIS — Z9221 Personal history of antineoplastic chemotherapy: Secondary | ICD-10-CM | POA: Diagnosis not present

## 2023-11-05 HISTORY — PX: IR IMAGING GUIDED PORT INSERTION: IMG5740

## 2023-11-05 HISTORY — PX: IR LUMBAR PUNCTURE: IMG944

## 2023-11-05 MED ORDER — MIDAZOLAM HCL 2 MG/2ML IJ SOLN
INTRAMUSCULAR | Status: AC | PRN
  Administered 2023-11-05 (×4): 1 mg via INTRAVENOUS

## 2023-11-05 MED ORDER — LIDOCAINE-EPINEPHRINE 1 %-1:100000 IJ SOLN
INTRAMUSCULAR | Status: AC
  Filled 2023-11-05: qty 1

## 2023-11-05 MED ORDER — FENTANYL CITRATE (PF) 100 MCG/2ML IJ SOLN
INTRAMUSCULAR | Status: AC
  Filled 2023-11-05: qty 2

## 2023-11-05 MED ORDER — SODIUM CHLORIDE 0.9 % IV SOLN: INTRAVENOUS | Status: DC

## 2023-11-05 MED ORDER — HEPARIN SOD (PORK) LOCK FLUSH 100 UNIT/ML IV SOLN
500.0000 [IU] | Freq: Once | INTRAVENOUS | Status: AC
  Administered 2023-11-05: 500 [IU] via INTRAVENOUS

## 2023-11-05 MED ORDER — LIDOCAINE HCL 1 % IJ SOLN: 20.0000 mL | Freq: Once | INTRAMUSCULAR | Status: DC

## 2023-11-05 MED ORDER — HEPARIN SOD (PORK) LOCK FLUSH 100 UNIT/ML IV SOLN
INTRAVENOUS | Status: AC
  Filled 2023-11-05: qty 5

## 2023-11-05 MED ORDER — FENTANYL CITRATE (PF) 100 MCG/2ML IJ SOLN
INTRAMUSCULAR | Status: AC | PRN
  Administered 2023-11-05 (×2): 50 ug via INTRAVENOUS

## 2023-11-05 MED ORDER — MIDAZOLAM HCL 2 MG/2ML IJ SOLN
INTRAMUSCULAR | Status: AC
Start: 2023-11-05 — End: 2023-11-05
  Filled 2023-11-05: qty 4

## 2023-11-05 MED ORDER — LIDOCAINE-EPINEPHRINE 1 %-1:100000 IJ SOLN
20.0000 mL | Freq: Once | INTRAMUSCULAR | Status: AC
  Administered 2023-11-05: 15 mL via INTRADERMAL

## 2023-11-05 NOTE — Sedation Documentation (Signed)
 Repositioning patient for port placement.

## 2023-11-05 NOTE — Discharge Instructions (Signed)
 Please call Interventional Radiology clinic [phone] with any questions or concerns.  You may remove your dressing and shower tomorrow.  After the procedure, it is common to have: Discomfort at the port insertion site. Bruising on the skin over the port. This should improve over 3-4 days  Follow these instructions at home:  Medication: Do not use Aspirin or ibuprofen products, such as Advil or Motrin, as it may increase bleeding.  You may resume your usual medications as ordered by your doctor. If your doctor prescribed antibiotics, take them as directed. Do not stop taking them just because you feel better. You need to take the full course of antibiotics.  Eating and drinking: Drink plenty of liquids to keep your urine pale yellow You can resume your regular diet as directed by your doctor   Care of the procedure site Follow instructions from your health care provider about how to take care of your port insertion site. Make sure you: After your port is placed, you will get a manufacturer's information card. The card has information about your port. Keep this card with you at all times Make sure to remember what type of port you have Take care of the port as told by your health care provider DO NOT use EMLA cream for 2 weeks after port placement -the cream will remove the surgical glue on your incision DO NOT use any lotions, creams, or ointments on incision for 2 weeks. This will remove the surgical glue on your incision Wash your hands with soap and water before and after you change your bandage (dressing). If soap and water are not available, use hand sanitizer Change your dressing as told by your health care provider Leave skin glue, or adhesive strips in place. These skin closures may need to stay in place for 2 weeks or longer Check your port insertion site every day for signs of infection. Check for: Redness, swelling, or pain Fluid or blood Warmth Pus or a bad  smell  Activity Return to your normal activities as told by your health care provider. Ask your health care provider what activities are safe for you Do not lift anything that is heavier than 10 lb (4.5 kg), or the limit that you are told, until your health care provider says that it is safe Do not take baths, swim, or use a hot tub until your health care provider approves. Take showers only. Keep all follow-up visits as told by your doctor  Contact a health care provider if: You cannot flush your port with saline as directed, or you cannot draw blood from the port You have a fever or chills You have redness, swelling, or pain around your port insertion site You have fluid or blood coming from your port insertion site Your port insertion site feels warm to the touch You have pus or a bad smell coming from the port insertion site  Get help right away if: You have chest pain or shortness of breath You have bleeding from your port that you cannot control  Moderate Conscious Sedation-Care After  This sheet gives you information about how to care for yourself after your procedure. Your health care provider may also give you more specific instructions. If you have problems or questions, contact your health care provider.  After the procedure, it is common to have: Sleepiness for several hours. Impaired judgment for several hours. Difficulty with balance. Vomiting if you eat too soon.  Follow these instructions at home:  Rest. Do not  participate in activities where you could fall or become injured. Do not drive or use machinery. Do not drink alcohol. Do not take sleeping pills or medicines that cause drowsiness. Do not make important decisions or sign legal documents. Do not take care of children on your own.  Eating and drinking Follow the diet recommended by your health care provider. Drink enough fluid to keep your urine pale yellow. If you vomit: Drink water, juice, or soup  when you can drink without vomiting. Make sure you have little or no nausea before eating solid foods.  General instructions Take over-the-counter and prescription medicines only as told by your health care provider. Have a responsible adult stay with you for the time you are told. It is important to have someone help care for you until you are awake and alert. Do not smoke. Keep all follow-up visits as told by your health care provider. This is important.  Contact a health care provider if: You are still sleepy or having trouble with balance after 24 hours. You feel light-headed. You keep feeling nauseous or you keep vomiting. You develop a rash. You have a fever. You have redness or swelling around the IV site.  Get help right away if: You have trouble breathing. You have new-onset confusion at home.  This information is not intended to replace advice given to you by your health care provider. Make sure you discuss any questions you have with your healthcare provider.

## 2023-11-05 NOTE — Procedures (Signed)
 Interventional Radiology Procedure Note  Procedure:   1.) L2-L3 lumbar puncture with collection of 5 mL clear CSF\ 2.) Placement of a right internal jugular approach Bard ClearVue portacatheter  Complications: None  Estimated Blood Loss: None  Recommendations: - DC home    Signed,  Wilkie LOIS Lent, MD

## 2023-11-05 NOTE — Sedation Documentation (Signed)
 Lp completed

## 2023-11-05 NOTE — Sedation Documentation (Signed)
Start port placement 

## 2023-11-06 ENCOUNTER — Other Ambulatory Visit: Payer: Self-pay

## 2023-11-06 ENCOUNTER — Encounter (HOSPITAL_COMMUNITY): Payer: Self-pay | Admitting: Emergency Medicine

## 2023-11-06 NOTE — Pre-Procedure Instructions (Signed)
-------------    SDW INSTRUCTIONS given:  Your procedure is scheduled on 7/29.  [facility] at 06:45 A.M., and check in at the Admitting office.  Any questions or running late day of surgery: call [phone]    Remember:  Do not eat or drink after midnight the night before your surgery     Take these medicines the morning of surgery with A SIP OF WATER  decadron , flomax                May take these medicines IF NEEDED: ativan    As of today, STOP taking any Aspirin (unless otherwise instructed by your surgeon) Aleve, Naproxen, Ibuprofen, Motrin, Advil, Goody's, BC's, all herbal medications, fish oil, and all vitamins.   Do NOT Smoke (Tobacco/Vaping) 24 hours prior to your procedure  If you use a CPAP at night, you may bring all equipment for your overnight stay.     You will be asked to remove any contacts, glasses, piercing's, hearing aid's, dentures/partials prior to surgery. Please bring cases for these items if needed.     Patients discharged the day of surgery will not be allowed to drive home, and someone needs to stay with them for 24 hours.  SURGICAL WAITING ROOM VISITATION Patients may have no more than 2 support people in the waiting area - these visitors may rotate.   Pre-op nurse will coordinate an appropriate time for 1 ADULT support person, who may not rotate, to accompany patient in pre-op.  Children under the age of 61 must have an adult with them who is not the patient and must remain in the main waiting area with an adult.  If the patient needs to stay at the hospital during part of their recovery, the visitor guidelines for inpatient rooms apply.  Please refer to the [facility] website for the visitor guidelines for any additional information.   Special instructions:   [facility]- Preparing For Surgery   Please follow these instructions carefully.   Shower the NIGHT BEFORE SURGERY and the MORNING OF SURGERY with DIAL Soap.    Pat yourself dry with a CLEAN TOWEL.  Wear CLEAN PAJAMAS to bed the night before surgery  Place CLEAN SHEETS on your bed the night of your first shower and DO NOT SLEEP WITH PETS.   Additional instructions for the day of surgery: DO NOT APPLY any lotions, deodorants, cologne, or perfumes.   Do not wear jewelry or makeup Do not wear nail polish, gel polish, artificial nails, or any other type of covering on natural nails (fingers and toes) Do not bring valuables to the hospital. [facility] is not responsible for valuables/personal belongings. Put on clean/comfortable clothes.  Please brush your teeth.  Ask your nurse before applying any prescription medications to the skin.

## 2023-11-06 NOTE — Progress Notes (Signed)
 PCP - Eleanor Ponto, NP Cardiologist - denies  PPM/ICD - denies   Chest x-ray - 08/14/22- CE EKG - 01/23/21- CE- n/a Stress Test - 03/14/20 ECHO - denies Cardiac Cath - denies  CPAP - denies  DM- denies  ASA/Blood Thinner Instructions:  n/a   ERAS Protcol - no, NPO  COVID TEST- n/a  Anesthesia review: no  Patient verbally denies any shortness of breath, fever, cough and chest pain during phone call       Questions were answered. Patient verbalized understanding of instructions.

## 2023-11-09 ENCOUNTER — Encounter: Payer: Self-pay | Admitting: Radiation Oncology

## 2023-11-09 ENCOUNTER — Encounter

## 2023-11-10 ENCOUNTER — Ambulatory Visit: Admitting: Pulmonary Disease

## 2023-11-10 ENCOUNTER — Other Ambulatory Visit: Payer: Self-pay | Admitting: Radiation Therapy

## 2023-11-10 ENCOUNTER — Ambulatory Visit (HOSPITAL_COMMUNITY): Admitting: Anesthesiology

## 2023-11-10 ENCOUNTER — Encounter (HOSPITAL_COMMUNITY): Payer: Self-pay | Admitting: Emergency Medicine

## 2023-11-10 ENCOUNTER — Other Ambulatory Visit: Payer: Self-pay

## 2023-11-10 ENCOUNTER — Ambulatory Visit (HOSPITAL_COMMUNITY)
Admission: RE | Admit: 2023-11-10 | Discharge: 2023-11-10 | Disposition: A | Discharge status: Home / Self Care | Attending: Emergency Medicine | Admitting: Emergency Medicine

## 2023-11-10 ENCOUNTER — Encounter (HOSPITAL_COMMUNITY)
Admission: RE | Disposition: A | Discharge status: Home / Self Care | Payer: Self-pay | Source: Home / Self Care | Attending: Emergency Medicine

## 2023-11-10 DIAGNOSIS — C3411 Malignant neoplasm of upper lobe, right bronchus or lung: Secondary | ICD-10-CM | POA: Diagnosis not present

## 2023-11-10 DIAGNOSIS — R590 Localized enlarged lymph nodes: Secondary | ICD-10-CM | POA: Diagnosis not present

## 2023-11-10 DIAGNOSIS — C3491 Malignant neoplasm of unspecified part of right bronchus or lung: Secondary | ICD-10-CM | POA: Insufficient documentation

## 2023-11-10 DIAGNOSIS — Z87891 Personal history of nicotine dependence: Secondary | ICD-10-CM | POA: Diagnosis not present

## 2023-11-10 DIAGNOSIS — C3490 Malignant neoplasm of unspecified part of unspecified bronchus or lung: Secondary | ICD-10-CM | POA: Diagnosis not present

## 2023-11-10 DIAGNOSIS — C771 Secondary and unspecified malignant neoplasm of intrathoracic lymph nodes: Secondary | ICD-10-CM | POA: Insufficient documentation

## 2023-11-10 DIAGNOSIS — Z7952 Long term (current) use of systemic steroids: Secondary | ICD-10-CM | POA: Diagnosis not present

## 2023-11-10 HISTORY — PX: VIDEO BRONCHOSCOPY WITH ENDOBRONCHIAL ULTRASOUND: SHX6177

## 2023-11-10 LAB — CBC
HCT: 38.1 % — ABNORMAL LOW (ref 39.0–52.0)
Hemoglobin: 13 g/dL (ref 13.0–17.0)
MCH: 32.1 pg (ref 26.0–34.0)
MCHC: 34.1 g/dL (ref 30.0–36.0)
MCV: 94.1 fL (ref 80.0–100.0)
Platelets: 250 K/uL (ref 150–400)
RBC: 4.05 MIL/uL — ABNORMAL LOW (ref 4.22–5.81)
RDW: 12.4 % (ref 11.5–15.5)
WBC: 8.7 K/uL (ref 4.0–10.5)
nRBC: 0 % (ref 0.0–0.2)

## 2023-11-10 LAB — CYTOLOGY - NON PAP

## 2023-11-10 LAB — COMPREHENSIVE METABOLIC PANEL WITH GFR
ALT: 24 U/L (ref 0–44)
AST: 23 U/L (ref 15–41)
Albumin: 4.2 g/dL (ref 3.5–5.0)
Alkaline Phosphatase: 51 U/L (ref 38–126)
Anion gap: 12 (ref 5–15)
BUN: 20 mg/dL (ref 8–23)
CO2: 22 mmol/L (ref 22–32)
Calcium: 9.1 mg/dL (ref 8.9–10.3)
Chloride: 92 mmol/L — ABNORMAL LOW (ref 98–111)
Creatinine, Ser: 0.93 mg/dL (ref 0.61–1.24)
GFR, Estimated: >60 mL/min (ref >60)
Glucose, Bld: 121 mg/dL — ABNORMAL HIGH (ref 70–99)
Potassium: 3.6 mmol/L (ref 3.5–5.1)
Sodium: 126 mmol/L — ABNORMAL LOW (ref 135–145)
Total Bilirubin: 0.7 mg/dL (ref 0.0–1.2)
Total Protein: 6.7 g/dL (ref 6.5–8.1)

## 2023-11-10 SURGERY — BRONCHOSCOPY, WITH EBUS
Anesthesia: General | Laterality: Bilateral

## 2023-11-10 MED ORDER — ONDANSETRON HCL 4 MG/2ML IJ SOLN
INTRAMUSCULAR | Status: DC | PRN
  Administered 2023-11-10: 4 mg via INTRAVENOUS

## 2023-11-10 MED ORDER — CHLORHEXIDINE GLUCONATE 0.12 % MT SOLN
15.0000 mL | Freq: Once | OROMUCOSAL | Status: AC
  Administered 2023-11-10: 15 mL via OROMUCOSAL
  Filled 2023-11-10: qty 15

## 2023-11-10 MED ORDER — PROPOFOL 500 MG/50ML IV EMUL
INTRAVENOUS | Status: DC | PRN
  Administered 2023-11-10: 75 ug/kg/min via INTRAVENOUS

## 2023-11-10 MED ORDER — PROPOFOL 10 MG/ML IV BOLUS
INTRAVENOUS | Status: DC | PRN
  Administered 2023-11-10: 70 mg via INTRAVENOUS
  Administered 2023-11-10: 30 mg via INTRAVENOUS

## 2023-11-10 MED ORDER — ROCURONIUM BROMIDE 10 MG/ML (PF) SYRINGE
PREFILLED_SYRINGE | INTRAVENOUS | Status: DC | PRN
  Administered 2023-11-10: 40 mg via INTRAVENOUS

## 2023-11-10 MED ORDER — LACTATED RINGERS IV SOLN: INTRAVENOUS | Status: DC

## 2023-11-10 MED ORDER — LIDOCAINE 2% (20 MG/ML) 5 ML SYRINGE
INTRAMUSCULAR | Status: DC | PRN
  Administered 2023-11-10: 40 mg via INTRAVENOUS

## 2023-11-10 MED ORDER — FENTANYL CITRATE (PF) 250 MCG/5ML IJ SOLN
INTRAMUSCULAR | Status: DC | PRN
  Administered 2023-11-10: 50 ug via INTRAVENOUS

## 2023-11-10 MED ORDER — SUGAMMADEX SODIUM 200 MG/2ML IV SOLN
INTRAVENOUS | Status: DC | PRN
  Administered 2023-11-10: 120 mg via INTRAVENOUS

## 2023-11-10 MED ORDER — PHENYLEPHRINE 80 MCG/ML (10ML) SYRINGE FOR IV PUSH (FOR BLOOD PRESSURE SUPPORT)
PREFILLED_SYRINGE | INTRAVENOUS | Status: DC | PRN
  Administered 2023-11-10 (×5): 160 ug via INTRAVENOUS

## 2023-11-10 MED ORDER — FENTANYL CITRATE (PF) 100 MCG/2ML IJ SOLN
INTRAMUSCULAR | Status: AC
Start: 2023-11-10 — End: 2023-11-10
  Filled 2023-11-10: qty 2

## 2023-11-10 NOTE — Transfer of Care (Signed)
 Immediate Anesthesia Transfer of Care Note  Patient: Ian Hart  Procedure(s) Performed: BRONCHOSCOPY, WITH EBUS (Bilateral)  Patient Location: PACU  Anesthesia Type:General  Level of Consciousness: awake, alert , and oriented  Airway & Oxygen Therapy: Patient Spontanous Breathing and Patient connected to face mask oxygen  Post-op Assessment: Report given to RN and Post -op Vital signs reviewed and stable  Post vital signs: Reviewed and stable  Last Vitals:  Vitals Value Taken Time  BP 111/60 11/10/23 10:41  Temp 36.7 C 11/10/23 10:41  Pulse 67 11/10/23 10:42  Resp 17 11/10/23 10:42  SpO2 100 % 11/10/23 10:42  Vitals shown include unfiled device data.  Last Pain:  Vitals:   11/10/23 1041  TempSrc: Temporal  PainSc: 0-No pain         Complications: No notable events documented.

## 2023-11-10 NOTE — Progress Notes (Signed)
 Called patient for ETA for procedure today. Arrival time was scheduled for 0645. Patient stated he would be here in five minutes.

## 2023-11-10 NOTE — Discharge Instructions (Addendum)

## 2023-11-10 NOTE — Anesthesia Preprocedure Evaluation (Signed)
 Anesthesia Evaluation  Patient identified by MRN, date of birth, ID band Patient awake    Reviewed: Allergy & Precautions, NPO status , Patient's Chart, lab work & pertinent test results  History of Anesthesia Complications Negative for: history of anesthetic complications  Airway Mallampati: II  TM Distance: >3 FB Neck ROM: Full    Dental  (+) Poor Dentition, Chipped,    Pulmonary Patient abstained from smoking., former smoker Lung ca   breath sounds clear to auscultation       Cardiovascular Exercise Tolerance: Good  Rhythm:Regular  03/14/20 Myoview   The left ventricular ejection fraction is mildly decreased (45-54%).  Nuclear stress EF: 53%. No wall motion abnormalities noted. Apical thinning noted.  There was no ST segment deviation noted during stress.  This is a low risk study. No signs of ischemia    Neuro/Psych  Neuromuscular disease  negative psych ROS   GI/Hepatic negative GI ROS, Neg liver ROS,,,  Endo/Other  negative endocrine ROS    Renal/GU Lab Results      Component                Value               Date                      NA                       128 (L)             10/19/2023                K                        4.4                 10/19/2023                CO2                      26                  10/19/2023                GLUCOSE                  119 (H)             10/19/2023                BUN                      11                  10/19/2023                CREATININE               1.08                10/19/2023                CALCIUM                  9.3                 10/19/2023  GFR                      68.15               10/19/2023                GFRNONAA                 >60                 10/05/2023                Musculoskeletal negative musculoskeletal ROS (+)    Abdominal   Peds  Hematology negative hematology ROS (+) Lab Results      Component                 Value               Date                      WBC                      8.7                 11/10/2023                HGB                      13.0                11/10/2023                HCT                      38.1 (L)            11/10/2023                MCV                      94.1                11/10/2023                PLT                      250                 11/10/2023              Anesthesia Other Findings RUL nodule All: Acylovir  Reproductive/Obstetrics                              Anesthesia Physical Anesthesia Plan  ASA: 3  Anesthesia Plan: General   Post-op Pain Management:    Induction: Intravenous  PONV Risk Score and Plan: 2 and Treatment may vary due to age or medical condition, Ondansetron , Propofol  infusion and TIVA  Airway Management Planned: Oral ETT  Additional Equipment: None  Intra-op Plan:   Post-operative Plan: Extubation in OR  Informed Consent: I have reviewed the patients History and Physical, chart, labs and discussed the procedure including the risks, benefits and alternatives for the proposed anesthesia with the patient or authorized representative who has indicated his/her understanding and acceptance.     Dental advisory given  Plan Discussed  with: CRNA  Anesthesia Plan Comments: (PAT note written 03/21/2020 by Allison Zelenak, PA-C. For ISTAT to re-evaluate hyponatremia. Had low risk preoperative stress test. )         Anesthesia Quick Evaluation

## 2023-11-10 NOTE — H&P (Signed)
 Ian Hart is an 73 y.o. male.   Chief Complaint: Mediastinal adenopathy HPI: 73 year old manFollowed by Dr. 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  Surveillance PET scan as shown mediastinal adenopathy with hypermetabolism.  He presents now for tissue diagnosis.  He overall feels fairly well.  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  He understands the rationale, risk, benefits of the bronchoscopy.  Past Medical History:  Diagnosis Date   History of chicken pox    History of shingles 04/2010   Lung cancer (HCC) 02/2020    Past Surgical History:  Procedure Laterality Date   BRONCHIAL BIOPSY  03/22/2020   Procedure: BRONCHIAL BIOPSIES;  Surgeon: Brenna Adine CROME, DO;  Location: [facility] END[facility];  Service: Pulmonary;;   BRONCHIAL BRUSHINGS  03/22/2020   Procedure: BRONCHIAL BRUSHINGS;  Surgeon: Brenna Adine CROME, DO;  Location: [facility];  Service: Pulmonary;;   BRONCHIAL NEEDLE ASPIRATION BIOPSY  03/22/2020   Procedure: BRONCHIAL NEEDLE ASPIRATION BIOPSIES;  Surgeon: Brenna Adine CROME, DO;  Location: [facility];  Service: Pulmonary;;   BRONCHIAL WASHINGS  03/22/2020   Procedure: BRONCHIAL WASHINGS;  Surgeon: Brenna Adine CROME, DO;  Location: [facility];  Service: Pulmonary;;   FIDUCIAL MARKER PLACEMENT  03/22/2020   Procedure: FIDUCIAL MARKER PLACEMENT;  Surgeon: Brenna Adine CROME, DO;  Location: [facility];  Service: Pulmonary;;   IR IMAGING GUIDED PORT INSERTION  11/05/2023   IR LUMBAR PUNCTURE  11/05/2023   ROBOTIC ASSITED PARTIAL NEPHRECTOMY Left 2022   VIDEO BRONCHOSCOPY WITH ENDOBRONCHIAL NAVIGATION N/A 03/22/2020   Procedure: VIDEO BRONCHOSCOPY WITH ENDOBRONCHIAL NAVIGATION;  Surgeon: Brenna Adine CROME, DO;  Location: [facility];  Service: Pulmonary;  Laterality: N/A;   VIDEO BRONCHOSCOPY WITH ENDOBRONCHIAL ULTRASOUND N/A 03/22/2020   Procedure: VIDEO BRONCHOSCOPY  WITH ENDOBRONCHIAL ULTRASOUND;  Surgeon: Brenna Adine CROME, DO;  Location: [facility];  Service: Pulmonary;  Laterality: N/A;    Family History  Problem Relation Age of Onset   Sudden death Mother    Rheumatic fever Mother    COPD Brother    Liver disease Neg Hx    Colon cancer Neg Hx    Esophageal cancer Neg Hx    Social History:  reports that he quit smoking about 2 years ago. His smoking use included cigarettes. He has a 100 pack-year smoking history. He has never used smokeless tobacco. He reports current alcohol use of about 10.0 standard drinks of alcohol per week. He reports that he does not currently use drugs.  Allergies:  Allergies  Allergen Reactions   Acyclovir And Related Rash    Rash that looked like chicken pox    Medications Prior to Admission  Medication Sig Dispense Refill   alendronate  (FOSAMAX ) 70 MG tablet Take 1 tablet (70 mg total) by mouth every 7 (seven) days. Take with a full glass of water on an empty stomach. 12 tablet 4   Calcium Carbonate-Vit D-Min (CALTRATE 600+D PLUS MINERALS) 600-800 MG-UNIT TABS Take 1 tablet by mouth in the morning and at bedtime.     dexamethasone  (DECADRON ) 4 MG tablet Take 1 tablet (4 mg total) by mouth 2 (two) times daily. 90 tablet 1   LORazepam  (ATIVAN ) 0.5 MG tablet 1 tab po 30 minutes prior to radiation or MRI scans 10 tablet 0   meloxicam  (MOBIC ) 7.5 MG tablet Take 1 tablet (7.5 mg total) by mouth daily. (Patient not taking: Reported on 11/06/2023) 14 tablet 0   Multiple  Vitamin (MULTIVITAMIN) tablet Take 1 tablet by mouth daily.     tamsulosin  (FLOMAX ) 0.4 MG CAPS capsule Take 1 capsule (0.4 mg total) by mouth daily. 90 capsule 1   Zoster Vaccine Adjuvanted (SHINGRIX ) injection 0.5ml IM now and again in 2-6 months 0.5 mL 1    No results found for this or any previous visit (from the past 48 hours). No results found.  Review of Systems Per HPI Blood pressure (!) 141/73, pulse 72, temperature (!) 97.5 F (36.4 C),  resp. rate 18, height 6' 1 (1.854 m), weight 59 kg, SpO2 100%. Physical Exam  Gen: Pleasant, thin, in no distress,  normal affect  ENT: No lesions,  mouth clear,  oropharynx clear, no postnasal drip  Neck: No JVD, no stridor, right-sided Port-A-Cath  Lungs: No use of accessory muscles, no crackles or wheezing on normal respiration, no wheeze on forced expiration  Cardiovascular: RRR, heart sounds normal, no murmur or gallops, no peripheral edema  Musculoskeletal: No deformities, no cyanosis or clubbing  Neuro: alert, awake, non focal  Skin: Warm, no lesions or rashes    Assessment/Plan Mediastinal adenopathy with hypermetabolism on PET scan in a patient with a history of small cell lung cancer, poorly differentiated adenocarcinoma of the lung.  Plan is for bronchoscopy with endobronchial ultrasound and nodal biopsies.  Patient understands the risk, benefits, rationale and agrees to proceed.  No barriers identified.  Lamar GORMAN Chris, MD 11/10/2023, 8:01 AM

## 2023-11-10 NOTE — Anesthesia Postprocedure Evaluation (Signed)
 Anesthesia Post Note  Patient: Ian Hart  Procedure(s) Performed: BRONCHOSCOPY, WITH EBUS (Bilateral)     Patient location during evaluation: PACU Anesthesia Type: General Level of consciousness: awake and alert Pain management: pain level controlled Vital Signs Assessment: post-procedure vital signs reviewed and stable Respiratory status: spontaneous breathing, nonlabored ventilation and respiratory function stable Cardiovascular status: blood pressure returned to baseline and stable Postop Assessment: no apparent nausea or vomiting Anesthetic complications: no   No notable events documented.  Last Vitals:  Vitals:   11/10/23 1050 11/10/23 1100  BP: 109/60 (!) 105/57  Pulse: 60 63  Resp: 18 14  Temp:    SpO2: 99% 98%    Last Pain:  Vitals:   11/10/23 1100  TempSrc:   PainSc: 0-No pain                 Imoni Kohen

## 2023-11-10 NOTE — Anesthesia Procedure Notes (Signed)
 Procedure Name: Intubation Date/Time: 11/10/2023 9:37 AM  Performed by: Vera Rochele PARAS, CRNAPre-anesthesia Checklist: Patient identified, Emergency Drugs available, Suction available and Patient being monitored Patient Re-evaluated:Patient Re-evaluated prior to induction Oxygen Delivery Method: Circle System Utilized Preoxygenation: Pre-oxygenation with 100% oxygen Induction Type: IV induction Ventilation: Mask ventilation without difficulty Laryngoscope Size: Miller and 3 Grade View: Grade I Tube type: Oral Tube size: 8.5 mm Number of attempts: 1 Airway Equipment and Method: Stylet and Oral airway Placement Confirmation: ETT inserted through vocal cords under direct vision, positive ETCO2 and breath sounds checked- equal and bilateral Secured at: 23 cm Tube secured with: Tape Dental Injury: Teeth and Oropharynx as per pre-operative assessment

## 2023-11-10 NOTE — Op Note (Signed)
 Video Bronchoscopy with Endobronchial Ultrasound Procedure Note  Date of Operation: 11/10/2023  Pre-op Diagnosis: Mediastinal adenopathy  Post-op Diagnosis: Same  Surgeon: LAMAR CHRIS  Assistants: None  Anesthesia: General endotracheal anesthesia  Operation: Flexible video fiberoptic bronchoscopy with endobronchial ultrasound and biopsies.  Estimated Blood Loss:  Complications: None apparent  Indications and History: Ian Hart is a 73 y.o. male with history of adenocarcinoma/small cell carcinoma.  Most recent surveillance imaging showed hypermetabolic mediastinal adenopathy.  Recommendation made to achieve a tissue diagnosis via endobronchial ultrasound with biopsies.  The risks, benefits, complications, treatment options and expected outcomes were discussed with the patient.  The possibilities of pneumothorax, pneumonia, reaction to medication, pulmonary aspiration, perforation of a viscus, bleeding, failure to diagnose a condition and creating a complication requiring transfusion or operation were discussed with the patient who freely signed the consent.    Description of Procedure: The patient was examined in the preoperative area and history and data from the preprocedure consultation were reviewed. It was deemed appropriate to proceed.  The patient was taken to [facility] Endoscopy room 3, identified as Ian Hart and the procedure verified as Flexible Video Fiberoptic Bronchoscopy.  A Time Out was held and the above information confirmed. After being taken to the operating room general anesthesia was initiated and the patient  was orally intubated. The video fiberoptic bronchoscope was introduced via the endotracheal tube and a general inspection was performed which showed no abnormalities. The standard scope was then withdrawn and the endobronchial ultrasound was used to identify and characterize the peritracheal, hilar and bronchial lymph nodes. Inspection showed  enlargement at station 4L and 2R. Using real-time ultrasound guidance Wang needle biopsies were take from Station 4L and 2R nodes and were sent for cytology. The patient tolerated the procedure well without apparent complications. There was no significant blood loss. The bronchoscope was withdrawn. Anesthesia was reversed and the patient was taken to the PACU for recovery.   Samples: 1. Wang needle biopsies from 2R node 2. Wang needle biopsies from 4L node   Plans:  The patient will be discharged from the PACU to home when recovered from anesthesia. We will review the cytology, pathology and microbiology results with the patient when they become available. Outpatient followup will be with Dr Sherrod.    Ian Hart S. 11/10/2023

## 2023-11-11 ENCOUNTER — Encounter (HOSPITAL_COMMUNITY): Payer: Self-pay | Admitting: Emergency Medicine

## 2023-11-11 ENCOUNTER — Telehealth: Payer: Self-pay | Admitting: Radiation Oncology

## 2023-11-11 NOTE — Telephone Encounter (Signed)
 I spoke with the patient and shared the CSF cytology is suspicious for malignancy. His bronchoscopy went well yesterday and cytology is pending from this as well. He states he had a slight dry cough afterwards and fatigue but is otherwise doing well. He is counseled on Dr. Smitty recommendation to treat L4-5 and L1 levels of the spine and spinal canal with radiation, Dr. Dewey is also considering T5 as well. The course recommended would be 3 weeks of therapy so he can also begin systemic therapy as recommended by Dr. Sherrod. He will continue steroids and we will taper these as he completes radiotherapy. He will come for simulation tomorrow and begin treatment on 11/16/23.

## 2023-11-12 ENCOUNTER — Ambulatory Visit
Admission: RE | Admit: 2023-11-12 | Discharge: 2023-11-12 | Disposition: A | Discharge status: Home / Self Care | Source: Ambulatory Visit | Attending: Radiation Oncology | Admitting: Radiation Oncology

## 2023-11-12 DIAGNOSIS — C3411 Malignant neoplasm of upper lobe, right bronchus or lung: Secondary | ICD-10-CM | POA: Diagnosis present

## 2023-11-12 DIAGNOSIS — F17210 Nicotine dependence, cigarettes, uncomplicated: Secondary | ICD-10-CM | POA: Diagnosis not present

## 2023-11-12 DIAGNOSIS — Z510 Encounter for antineoplastic radiation therapy: Secondary | ICD-10-CM | POA: Diagnosis present

## 2023-11-13 ENCOUNTER — Ambulatory Visit: Payer: Self-pay | Admitting: Family

## 2023-11-13 ENCOUNTER — Ambulatory Visit
Admission: RE | Admit: 2023-11-13 | Discharge: 2023-11-13 | Disposition: A | Discharge status: Home / Self Care | Source: Ambulatory Visit | Attending: Radiation Oncology | Admitting: Radiation Oncology

## 2023-11-13 DIAGNOSIS — Z7952 Long term (current) use of systemic steroids: Secondary | ICD-10-CM | POA: Diagnosis not present

## 2023-11-13 DIAGNOSIS — C7971 Secondary malignant neoplasm of right adrenal gland: Secondary | ICD-10-CM | POA: Diagnosis not present

## 2023-11-13 DIAGNOSIS — C3411 Malignant neoplasm of upper lobe, right bronchus or lung: Secondary | ICD-10-CM | POA: Insufficient documentation

## 2023-11-13 DIAGNOSIS — Z9221 Personal history of antineoplastic chemotherapy: Secondary | ICD-10-CM | POA: Insufficient documentation

## 2023-11-13 DIAGNOSIS — D61818 Other pancytopenia: Secondary | ICD-10-CM | POA: Insufficient documentation

## 2023-11-13 DIAGNOSIS — Z79899 Other long term (current) drug therapy: Secondary | ICD-10-CM | POA: Diagnosis not present

## 2023-11-13 DIAGNOSIS — C771 Secondary and unspecified malignant neoplasm of intrathoracic lymph nodes: Secondary | ICD-10-CM | POA: Insufficient documentation

## 2023-11-13 DIAGNOSIS — C642 Malignant neoplasm of left kidney, except renal pelvis: Secondary | ICD-10-CM | POA: Diagnosis not present

## 2023-11-13 DIAGNOSIS — Z510 Encounter for antineoplastic radiation therapy: Secondary | ICD-10-CM | POA: Insufficient documentation

## 2023-11-13 DIAGNOSIS — C7972 Secondary malignant neoplasm of left adrenal gland: Secondary | ICD-10-CM | POA: Diagnosis not present

## 2023-11-13 DIAGNOSIS — Z923 Personal history of irradiation: Secondary | ICD-10-CM | POA: Insufficient documentation

## 2023-11-13 DIAGNOSIS — Z5112 Encounter for antineoplastic immunotherapy: Secondary | ICD-10-CM | POA: Diagnosis not present

## 2023-11-13 DIAGNOSIS — C7931 Secondary malignant neoplasm of brain: Secondary | ICD-10-CM | POA: Diagnosis not present

## 2023-11-13 DIAGNOSIS — F17210 Nicotine dependence, cigarettes, uncomplicated: Secondary | ICD-10-CM | POA: Insufficient documentation

## 2023-11-13 DIAGNOSIS — Z85118 Personal history of other malignant neoplasm of bronchus and lung: Secondary | ICD-10-CM | POA: Diagnosis not present

## 2023-11-13 LAB — CYTOLOGY - NON PAP

## 2023-11-13 NOTE — Telephone Encounter (Signed)
 Spoke to pt re: cytology results suggesting renal carcinoma.  Advised him to keep his upcoming appointment with Dr. Emery. He verbalizes understanding.

## 2023-11-16 ENCOUNTER — Encounter: Payer: Self-pay | Admitting: Radiation Oncology

## 2023-11-16 ENCOUNTER — Ambulatory Visit (HOSPITAL_COMMUNITY)

## 2023-11-16 ENCOUNTER — Ambulatory Visit: Admitting: Radiation Oncology

## 2023-11-16 ENCOUNTER — Inpatient Hospital Stay

## 2023-11-16 ENCOUNTER — Other Ambulatory Visit (HOSPITAL_COMMUNITY)

## 2023-11-16 DIAGNOSIS — Z5112 Encounter for antineoplastic immunotherapy: Secondary | ICD-10-CM | POA: Insufficient documentation

## 2023-11-16 DIAGNOSIS — C7971 Secondary malignant neoplasm of right adrenal gland: Secondary | ICD-10-CM | POA: Insufficient documentation

## 2023-11-16 DIAGNOSIS — C7972 Secondary malignant neoplasm of left adrenal gland: Secondary | ICD-10-CM | POA: Insufficient documentation

## 2023-11-16 DIAGNOSIS — C7931 Secondary malignant neoplasm of brain: Secondary | ICD-10-CM | POA: Insufficient documentation

## 2023-11-16 DIAGNOSIS — Z79620 Long term (current) use of immunosuppressive biologic: Secondary | ICD-10-CM | POA: Insufficient documentation

## 2023-11-16 DIAGNOSIS — C642 Malignant neoplasm of left kidney, except renal pelvis: Secondary | ICD-10-CM | POA: Insufficient documentation

## 2023-11-16 DIAGNOSIS — C781 Secondary malignant neoplasm of mediastinum: Secondary | ICD-10-CM | POA: Insufficient documentation

## 2023-11-17 ENCOUNTER — Other Ambulatory Visit: Payer: Self-pay

## 2023-11-17 DIAGNOSIS — Z510 Encounter for antineoplastic radiation therapy: Secondary | ICD-10-CM | POA: Diagnosis not present

## 2023-11-17 LAB — RAD ONC ARIA SESSION SUMMARY
Course Elapsed Days: 0
Plan Fractions Treated to Date: 1
Plan Fractions Treated to Date: 1
Plan Prescribed Dose Per Fraction: 2.5 Gy
Plan Prescribed Dose Per Fraction: 2.5 Gy
Plan Total Fractions Prescribed: 15
Plan Total Fractions Prescribed: 15
Plan Total Prescribed Dose: 37.5 Gy
Plan Total Prescribed Dose: 37.5 Gy
Reference Point Dosage Given to Date: 2.5 Gy
Reference Point Dosage Given to Date: 2.5 Gy
Reference Point Session Dosage Given: 2.5 Gy
Reference Point Session Dosage Given: 2.5 Gy
Session Number: 1

## 2023-11-18 ENCOUNTER — Encounter: Payer: Self-pay | Admitting: Family

## 2023-11-18 ENCOUNTER — Other Ambulatory Visit: Payer: Self-pay

## 2023-11-18 ENCOUNTER — Ambulatory Visit
Admission: RE | Admit: 2023-11-18 | Discharge: 2023-11-18 | Disposition: A | Discharge status: Home / Self Care | Source: Ambulatory Visit | Attending: Radiation Oncology

## 2023-11-18 ENCOUNTER — Inpatient Hospital Stay (HOSPITAL_BASED_OUTPATIENT_CLINIC_OR_DEPARTMENT_OTHER): Admitting: Internal Medicine

## 2023-11-18 ENCOUNTER — Inpatient Hospital Stay: Attending: Radiation Oncology

## 2023-11-18 VITALS — BP 118/64 | HR 72 | Temp 98.3°F | Resp 17 | Ht 73.0 in | Wt 130.0 lb

## 2023-11-18 DIAGNOSIS — C642 Malignant neoplasm of left kidney, except renal pelvis: Secondary | ICD-10-CM | POA: Diagnosis not present

## 2023-11-18 DIAGNOSIS — C3411 Malignant neoplasm of upper lobe, right bronchus or lung: Secondary | ICD-10-CM

## 2023-11-18 DIAGNOSIS — C7931 Secondary malignant neoplasm of brain: Secondary | ICD-10-CM

## 2023-11-18 DIAGNOSIS — Z510 Encounter for antineoplastic radiation therapy: Secondary | ICD-10-CM | POA: Diagnosis not present

## 2023-11-18 LAB — RAD ONC ARIA SESSION SUMMARY
Course Elapsed Days: 1
Plan Fractions Treated to Date: 2
Plan Fractions Treated to Date: 2
Plan Prescribed Dose Per Fraction: 2.5 Gy
Plan Prescribed Dose Per Fraction: 2.5 Gy
Plan Total Fractions Prescribed: 15
Plan Total Fractions Prescribed: 15
Plan Total Prescribed Dose: 37.5 Gy
Plan Total Prescribed Dose: 37.5 Gy
Reference Point Dosage Given to Date: 5 Gy
Reference Point Dosage Given to Date: 5 Gy
Reference Point Session Dosage Given: 2.5 Gy
Reference Point Session Dosage Given: 2.5 Gy
Session Number: 2

## 2023-11-18 LAB — CBC WITH DIFFERENTIAL (CANCER CENTER ONLY)
Abs Immature Granulocytes: 0.04 K/uL (ref 0.00–0.07)
Basophils Absolute: 0 K/uL (ref 0.0–0.1)
Basophils Relative: 0 %
Eosinophils Absolute: 0.1 K/uL (ref 0.0–0.5)
Eosinophils Relative: 1 %
HCT: 34.5 % — ABNORMAL LOW (ref 39.0–52.0)
Hemoglobin: 12.2 g/dL — ABNORMAL LOW (ref 13.0–17.0)
Immature Granulocytes: 1 %
Lymphocytes Relative: 6 %
Lymphs Abs: 0.5 K/uL — ABNORMAL LOW (ref 0.7–4.0)
MCH: 32 pg (ref 26.0–34.0)
MCHC: 35.4 g/dL (ref 30.0–36.0)
MCV: 90.6 fL (ref 80.0–100.0)
Monocytes Absolute: 0.6 K/uL (ref 0.1–1.0)
Monocytes Relative: 7 %
Neutro Abs: 7.1 K/uL (ref 1.7–7.7)
Neutrophils Relative %: 85 %
Platelet Count: 199 K/uL (ref 150–400)
RBC: 3.81 MIL/uL — ABNORMAL LOW (ref 4.22–5.81)
RDW: 12.4 % (ref 11.5–15.5)
WBC Count: 8.3 K/uL (ref 4.0–10.5)
nRBC: 0 % (ref 0.0–0.2)

## 2023-11-18 LAB — CMP (CANCER CENTER ONLY)
ALT: 15 U/L (ref 0–44)
AST: 13 U/L — ABNORMAL LOW (ref 15–41)
Albumin: 4 g/dL (ref 3.5–5.0)
Alkaline Phosphatase: 59 U/L (ref 38–126)
Anion gap: 5 (ref 5–15)
BUN: 18 mg/dL (ref 8–23)
CO2: 31 mmol/L (ref 22–32)
Calcium: 9 mg/dL (ref 8.9–10.3)
Chloride: 93 mmol/L — ABNORMAL LOW (ref 98–111)
Creatinine: 0.86 mg/dL (ref 0.61–1.24)
GFR, Estimated: >60 mL/min (ref >60)
Glucose, Bld: 86 mg/dL (ref 70–99)
Potassium: 4 mmol/L (ref 3.5–5.1)
Sodium: 129 mmol/L — ABNORMAL LOW (ref 135–145)
Total Bilirubin: 0.6 mg/dL (ref 0.0–1.2)
Total Protein: 6.4 g/dL — ABNORMAL LOW (ref 6.5–8.1)

## 2023-11-18 MED ORDER — ONDANSETRON HCL 8 MG PO TABS
8.0000 mg | ORAL_TABLET | Freq: Three times a day (TID) | ORAL | 1 refills | Status: AC | PRN
Start: 2023-11-18 — End: ?

## 2023-11-18 MED ORDER — LIDOCAINE-PRILOCAINE 2.5-2.5 % EX CREA: TOPICAL_CREAM | CUTANEOUS | 3 refills | Status: AC

## 2023-11-18 MED ORDER — PROCHLORPERAZINE MALEATE 10 MG PO TABS
10.0000 mg | ORAL_TABLET | Freq: Four times a day (QID) | ORAL | 1 refills | Status: AC | PRN
Start: 2023-11-18 — End: ?

## 2023-11-18 NOTE — Progress Notes (Signed)
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 

## 2023-11-18 NOTE — Progress Notes (Signed)
 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    PRIOR THERAPY: Systemic chemotherapy/radiation with cisplatin  80 mg per metered squared on day 1 and etoposide  100 mg per metered squared on days 1, 2, and 3 IV every 3 weeks.  Status post 4 cycles.  Starting from cycle #3, cisplatin  was changed to carboplatin  for an AUC of 5 due to renal insufficiency. First dose on 04/20/20. Onpro was added to the treatment plan starting from cycle #3 due to pancytopenia.     CURRENT THERAPY: First-line treatment with immunotherapy with ipilimumab 1 Mg/KG in addition to nivolumab 360 Mg IV every 3 weeks for 4 cycles followed by maintenance treatment with nivolumab 480 Mg IV every 4 weeks.  First dose of treatment November 25, 2023.  INTERVAL HISTORY: Ian Hart 73 y.o. male returns to the clinic today for follow-up visit accompanied by his wife.  Discussed the use of AI scribe software for clinical note transcription with the patient, who gave verbal consent to proceed.  History of Present Illness Ian Hart is a 74 year old male with metastatic clear cell renal cell carcinoma who presents for evaluation and discussion of treatment options. He is accompanied by his wife.  He was diagnosed with metastatic clear cell renal cell carcinoma of the left kidney in July 2025. He has a history of adenocarcinoma of the right upper lobe and small cell lung cancer involving station seven lymph node, diagnosed in December 2021. He underwent chemotherapy with cisplatin  and etoposide , along with concurrent  radiotherapy, and has been under observation since March 2022.  Recently, there is evidence of recurrent disease in the mediastinum and right adrenal gland. 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  He is currently taking 4 mg of Decadron  twice a day, which helps him gain weight. He has previously received brain radiation, and recent imaging shows improvement in the spots in his brain.  No current symptoms or complaints. He feels fine and has no nausea.    MEDICAL HISTORY: Past Medical History:  Diagnosis Date   History of chicken pox    History of shingles 04/2010   Lung cancer (HCC) 02/2020    ALLERGIES:  is allergic to acyclovir and related.  MEDICATIONS:  Current Outpatient Medications  Medication Sig Dispense Refill   alendronate  (FOSAMAX ) 70 MG tablet Take 1 tablet (70 mg total) by mouth every 7 (seven) days. Take with a full glass of water on an empty stomach. 12 tablet 4   Calcium Carbonate-Vit D-Min (CALTRATE 600+D PLUS MINERALS) 600-800 MG-UNIT TABS Take 1 tablet by mouth in the morning and at bedtime.     dexamethasone  (DECADRON ) 4 MG tablet Take 1 tablet (4 mg total) by mouth 2 (two) times daily. 90 tablet 1   LORazepam  (ATIVAN ) 0.5 MG tablet 1 tab po 30 minutes prior to radiation or MRI scans 10 tablet 0   meloxicam  (MOBIC ) 7.5 MG tablet Take 1 tablet (7.5 mg total) by mouth daily. (Patient not taking: Reported on 11/06/2023) 14 tablet 0   Multiple Vitamin (  MULTIVITAMIN) tablet Take 1 tablet by mouth daily.     tamsulosin  (FLOMAX ) 0.4 MG CAPS capsule Take 1 capsule (0.4 mg total) by mouth daily. 90 capsule 1   Zoster Vaccine Adjuvanted (SHINGRIX ) injection 0.5ml IM now and again in 2-6 months 0.5 mL 1   No current facility-administered medications for this visit.    SURGICAL HISTORY:  Past Surgical History:  Procedure Laterality Date   BRONCHIAL BIOPSY  03/22/2020   Procedure: BRONCHIAL BIOPSIES;  Surgeon: Brenna Adine CROME, DO;  Location: [facility];  Service: Pulmonary;;   BRONCHIAL BRUSHINGS  03/22/2020   Procedure: BRONCHIAL BRUSHINGS;  Surgeon: Brenna Adine CROME, DO;  Location: [facility];  Service: Pulmonary;;   BRONCHIAL NEEDLE ASPIRATION BIOPSY  03/22/2020   Procedure: BRONCHIAL NEEDLE ASPIRATION BIOPSIES;  Surgeon: Brenna Adine CROME, DO;  Location: [facility];  Service: Pulmonary;;   BRONCHIAL WASHINGS  03/22/2020   Procedure: BRONCHIAL WASHINGS;  Surgeon: Brenna Adine CROME, DO;  Location: [facility];  Service: Pulmonary;;   FIDUCIAL MARKER PLACEMENT  03/22/2020   Procedure: FIDUCIAL MARKER PLACEMENT;  Surgeon: Brenna Adine CROME, DO;  Location: [facility];  Service: Pulmonary;;   IR IMAGING GUIDED PORT INSERTION  11/05/2023   IR LUMBAR PUNCTURE  11/05/2023   ROBOTIC ASSITED PARTIAL NEPHRECTOMY Left 2022   VIDEO BRONCHOSCOPY WITH ENDOBRONCHIAL NAVIGATION N/A 03/22/2020   Procedure: VIDEO BRONCHOSCOPY WITH ENDOBRONCHIAL NAVIGATION;  Surgeon: Brenna Adine CROME, DO;  Location: [facility];  Service: Pulmonary;  Laterality: N/A;   VIDEO BRONCHOSCOPY WITH ENDOBRONCHIAL ULTRASOUND N/A 03/22/2020   Procedure: VIDEO BRONCHOSCOPY WITH ENDOBRONCHIAL ULTRASOUND;  Surgeon: Brenna Adine CROME, DO;  Location: [facility];  Service: Pulmonary;  Laterality: N/A;   VIDEO BRONCHOSCOPY WITH ENDOBRONCHIAL ULTRASOUND Bilateral 11/10/2023   Procedure: BRONCHOSCOPY, WITH EBUS;  Surgeon: Shelah Lamar RAMAN, MD;  Location: [facility] ENDOSCOPY;  Service: Cardiopulmonary;  Laterality: Bilateral;    REVIEW OF SYSTEMS:  Constitutional: positive for fatigue Eyes: negative Ears, nose, mouth, throat, and face: negative Respiratory: negative Cardiovascular: negative Gastrointestinal: negative Genitourinary:negative Integument/breast: negative Hematologic/lymphatic: negative Musculoskeletal:negative Neurological: negative Behavioral/Psych: negative Endocrine: negative Allergic/Immunologic: negative   PHYSICAL EXAMINATION:  General appearance: alert, cooperative, fatigued, and no distress Head: Normocephalic, without obvious abnormality, atraumatic Neck: no adenopathy, no JVD, supple, symmetrical, trachea midline, and thyroid  not enlarged, symmetric, no tenderness/mass/nodules Lymph nodes: Cervical, supraclavicular, and axillary nodes normal. Resp: clear to auscultation bilaterally Back: symmetric, no curvature. ROM normal. No CVA tenderness. Cardio: regular rate and rhythm, S1, S2 normal, no murmur, click, rub or gallop GI: soft, non-tender; bowel sounds normal; no masses,  no organomegaly Extremities: extremities normal, atraumatic, no cyanosis or edema Neurologic: Alert and oriented X 3, normal strength and tone. Normal symmetric reflexes. Normal coordination and gait  ECOG PERFORMANCE STATUS: 1 - Symptomatic but completely ambulatory  Blood pressure 118/64, pulse 72, temperature 98.3 F (36.8 C), temperature source Temporal, resp. rate 17, height 6' 1 (1.854 m), weight 130 lb (59 kg), SpO2 100%.  LABORATORY DATA: Lab Results  Component Value Date   WBC 8.3 11/18/2023   HGB 12.2 (L) 11/18/2023   HCT 34.5 (L) 11/18/2023   MCV 90.6 11/18/2023   PLT 199 11/18/2023      Chemistry      Component Value Date/Time   NA 126 (L) 11/10/2023 0755   K 3.6 11/10/2023 0755   CL 92 (L) 11/10/2023 0755   CO2 22 11/10/2023 0755   BUN 20 11/10/2023 0755   CREATININE 0.93 11/10/2023 0755   CREATININE 1.05 10/05/2023 0916  CREATININE 0.86 11/12/2011 0917      Component Value Date/Time   CALCIUM 9.1 11/10/2023 0755   ALKPHOS 51 11/10/2023 0755   AST 23 11/10/2023 0755   AST 17 10/05/2023 0916   ALT 24 11/10/2023 0755   ALT 11 10/05/2023 0916   BILITOT 0.7 11/10/2023 0755   BILITOT 0.6 10/05/2023 0916       RADIOGRAPHIC STUDIES: IR IMAGING GUIDED PORT INSERTION Result Date: 11/05/2023 INDICATION: 73 year old male with metastatic small cell lung cancer and 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 He  presents for fluoroscopic guided lumbar puncture followed by port catheter placement. EXAM: IMPLANTED PORT A CATH PLACEMENT WITH ULTRASOUND AND FLUOROSCOPIC GUIDANCE LUMBAR PUNCTURE WITH FLUORO GUIDANCE MEDICATIONS: None. ANESTHESIA/SEDATION: Versed  2 mg IV; Fentanyl  50 mcg IV; administered by the radiology nurse Moderate Sedation Time:  15 minutes The patient's vital signs and level of consciousness were continuously monitored during the procedure by the interventional radiology nurse under my direct supervision. FLUOROSCOPY: Radiation exposure index: 0 mGy reference air kerma COMPLICATIONS: None immediate. PROCEDURE: The patient was first placed prone on the fluoroscopy table. Fluoroscopic imaging was used to localize the L2-L3 level. The skin was marked and then sterilely prepped and draped in the standard fashion using Betadine skin prep. Local anesthesia was attained by infiltration with 1% lidocaine . A 3-1/2 inch 20 gauge spinal needle was then carefully advanced into the thecal space. There was return of clear CSF with a normal opening pressure. 5 mL CSF was collected and sent to the lab for cytology. Images were obtained and stored for the medical record. The needle was removed. A Band-Aid was placed. The patient was then repositioned into the supine position. The right neck and chest was prepped with chlorhexidine , and draped in the usual sterile fashion using maximum barrier technique (cap and mask, sterile gown, sterile gloves, large sterile sheet, hand hygiene and cutaneous antiseptic). Local anesthesia was attained by infiltration with 1% lidocaine  with epinephrine . Ultrasound demonstrated patency of the right internal jugular vein, and this was documented with an image. Under real-time ultrasound guidance, this vein was accessed with a 21 gauge micropuncture needle and image documentation was performed. A small dermatotomy was made at the access site with an 11 scalpel. A 0.018 wire was advanced into  the SVC and the access needle exchanged for a 47F micropuncture vascular sheath. The 0.018 wire was then removed and a 0.035 wire advanced into the IVC. An appropriate location for the subcutaneous reservoir was selected below the clavicle and an incision was made through the skin and underlying soft tissues. The subcutaneous tissues were then dissected using a combination of blunt and sharp surgical technique and a pocket was formed. A Bard ClearVue single lumen power injectable portacatheter was then tunneled through the subcutaneous tissues from the pocket to the dermatotomy and the port reservoir placed within the subcutaneous pocket. The venous access site was then serially dilated and a peel away vascular sheath placed over the wire. The wire was removed and the port catheter advanced into position under fluoroscopic guidance. The catheter tip is positioned in the superior cavoatrial junction. This was documented with a spot image. The portacatheter was then tested and found to flush and aspirate well. The port was flushed with saline followed by 100 units/mL heparinized saline. The pocket was then closed in two layers using first subdermal inverted interrupted absorbable sutures followed by a running subcuticular suture. The epidermis was then sealed with Dermabond. The dermatotomy at the venous access site was also closed  with Dermabond. IMPRESSION: 1. Successful L2-L3 lumbar puncture with collection of 5 mL clear CSF for cytology. 2. Successful placement of a right IJ approach Bard ClearVue portacatheter with ultrasound and fluoroscopic guidance. The catheter is ready for use. Electronically Signed   By: Wilkie Lent M.D.   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 He presents for fluoroscopic guided lumbar puncture followed by port catheter placement. EXAM: IMPLANTED  PORT A CATH PLACEMENT WITH ULTRASOUND AND FLUOROSCOPIC GUIDANCE LUMBAR PUNCTURE WITH FLUORO GUIDANCE MEDICATIONS: None. ANESTHESIA/SEDATION: Versed  2 mg IV; Fentanyl  50 mcg IV; administered by the radiology nurse Moderate Sedation Time:  15 minutes The patient's vital signs and level of consciousness were continuously monitored during the procedure by the interventional radiology nurse under my direct supervision. FLUOROSCOPY: Radiation exposure index: 0 mGy reference air kerma COMPLICATIONS: None immediate. PROCEDURE: The patient was first placed prone on the fluoroscopy table. Fluoroscopic imaging was used to localize the L2-L3 level. The skin was marked and then sterilely prepped and draped in the standard fashion using Betadine skin prep. Local anesthesia was attained by infiltration with 1% lidocaine . A 3-1/2 inch 20 gauge spinal needle was then carefully advanced into the thecal space. There was return of clear CSF with a normal opening pressure. 5 mL CSF was collected and sent to the lab for cytology. Images were obtained and stored for the medical record. The needle was removed. A Band-Aid was placed. The patient was then repositioned into the supine position. The right neck and chest was prepped with chlorhexidine , and draped in the usual sterile fashion using maximum barrier technique (cap and mask, sterile gown, sterile gloves, large sterile sheet, hand hygiene and cutaneous antiseptic). Local anesthesia was attained by infiltration with 1% lidocaine  with epinephrine . Ultrasound demonstrated patency of the right internal jugular vein, and this was documented with an image. Under real-time ultrasound guidance, this vein was accessed with a 21 gauge micropuncture needle and image documentation was performed. A small dermatotomy was made at the access site with an 11 scalpel. A 0.018 wire was advanced into the SVC and the access needle exchanged for a 73F micropuncture vascular sheath. The 0.018 wire was  then removed and a 0.035 wire advanced into the IVC. An appropriate location for the subcutaneous reservoir was selected below the clavicle and an incision was made through the skin and underlying soft tissues. The subcutaneous tissues were then dissected using a combination of blunt and sharp surgical technique and a pocket was formed. A Bard ClearVue single lumen power injectable portacatheter was then tunneled through the subcutaneous tissues from the pocket to the dermatotomy and the port reservoir placed within the subcutaneous pocket. The venous access site was then serially dilated and a peel away vascular sheath placed over the wire. The wire was removed and the port catheter advanced into position under fluoroscopic guidance. The catheter tip is positioned in the superior cavoatrial junction. This was documented with a spot image. The portacatheter was then tested and found to flush and aspirate well. The port was flushed with saline followed by 100 units/mL heparinized saline. The pocket was then closed in two layers using first subdermal inverted interrupted absorbable sutures followed by a running subcuticular suture. The epidermis was then sealed with Dermabond. The dermatotomy at the venous access site was also closed with Dermabond. IMPRESSION: 1. Successful L2-L3 lumbar puncture with collection of 5 mL clear CSF  for cytology. 2. Successful placement of a right IJ approach Bard ClearVue portacatheter with ultrasound and fluoroscopic guidance. The catheter is ready for use. Electronically Signed   By: Wilkie Lent M.D.   On: 11/05/2023 13:07   MR Brain W Wo Contrast Result Date: 11/03/2023 CLINICAL DATA:  Brain/CNS neoplasm, staging 3T SRS for Treatment planning EXAM: MRI HEAD WITHOUT AND WITH CONTRAST TECHNIQUE: Multiplanar, multiecho pulse sequences of the brain and surrounding structures were obtained without and with intravenous contrast. CONTRAST:  5.5mL GADAVIST  GADOBUTROL  1 MMOL/ML IV  SOLN COMPARISON:  MRI of the brain dated Sep 09, 2023. FINDINGS: Brain: All of the metastatic lesions previously noted within the cerebral and cerebellar hemispheres have significantly improved or completely resolved. There are no new lesions present. The residual lesions within the cerebellar hemispheres can be seen on images 54, 64, 73, 83, 92 and 103 of series 2053. The cerebral metastases can be seen on images 110, 137, 146, 204, 245, 248 and 271. There is moderate generalized cerebral volume loss. There is mild to moderate periventricular and deep cerebral white matter disease. There is no evidence of hemorrhage or hydrocephalus. Vascular: Normal vascular flow voids. Skull and upper cervical spine: Normal marrow signal. No osseous lesions. Sinuses/Orbits: Clear paranasal sinuses and mastoid air cells. Normal orbits. Other: None. IMPRESSION: 1. All of the previously demonstrated metastatic lesions within the cerebral and cerebellar hemispheres have either significantly improved or resolved. There are no new lesions evident. Electronically Signed   By: Evalene Coho M.D.   On: 11/03/2023 16:42   MR CERVICAL SPINE W WO CONTRAST Result Date: 11/03/2023 CLINICAL DATA:  Metastatic small cell lung cancer. EXAM: MRI CERVICAL SPINE WITHOUT AND WITH CONTRAST TECHNIQUE: Multiplanar and multiecho pulse sequences of the cervical spine, to include the craniocervical junction and cervicothoracic junction, were obtained without and with intravenous contrast. CONTRAST:  5.5mL GADAVIST  GADOBUTROL  1 MMOL/ML IV SOLN COMPARISON:  None Available. FINDINGS: Alignment: Anatomic. Vertebrae: The cervical vertebrae maintain their height and alignment and are mildly heterogeneous in signal intensity. No discrete lesions are present. There is a questionable lesion within the T2 vertebral body, which is hypointense on T1 and enhances. There is no evidence of fracture. Cord: Normal in morphology and signal intensity. No abnormal  enhancement. Posterior Fossa, vertebral arteries, paraspinal tissues: Negative. Disc levels: There is chronic degenerative disc disease with central spinal canal stenosis and bilateral neural foraminal stenosis at C4-5, C5-6 and C6-7. There is no definite spinal cord or nerve root impingement. IMPRESSION: 1. Questionable metastatic lesion within the T2 vertebral body. 2. Mild central spinal canal stenosis and bilateral neural foraminal stenosis at C4-5, C5-6 and C6-7. Electronically Signed   By: Evalene Coho M.D.   On: 11/03/2023 13:55   MR THORACIC SPINE W WO CONTRAST Result Date: 10/30/2023 EXAM: MRI THORACIC AND LUMBAR SPINE WITH AND WITHOUT INTRAVENOUS CONTRAST 10/30/2023 03:20:29 PM TECHNIQUE: Multiplanar multisequence MRI of the thoracic and lumbar spine was performed with and without the administration of intravenous contrast. COMPARISON: PET/CT 10/22/2023 CLINICAL HISTORY: Metastatic disease evaluation. Small cell carcinoma of upper lobe of right lung. FINDINGS: BONES AND ALIGNMENT: Dextrocurvature of the thoracic spine. No listhesis. Transitional lumbosacral anatomy with 6 non-rib-bearing, lumbar type vertebral bodies. Edema and faint enhancement within the posterior aspect of the T5 vertebral body extending into the right pedicle seen on sagittal image 13 of series 17, 18, 19 and 24, suspicious for possible metastatic disease. No suspicious marrow lesions. SPINAL CORD: Focal enhancement along the left aspect of the  spinal cord at the L1 level best seen on axial image 46 series 23 and sagittal image 15 series 24, suspicious for metastasis along a nerve root. SOFT TISSUES: Faint enhancement within the perineural cyst in the right neural foramen at T7-8 seen on sagittal image 3 series 24, which may be inflammatory or neoplastic in etiology. THORACIC DISC LEVELS: No significant disc herniation. No spinal canal stenosis or neural foraminal narrowing. LUMBAR DISC LEVELS: L1-L2: No significant disc  herniation. No spinal canal stenosis or neural foraminal narrowing. L2-L3: No significant disc herniation. No spinal canal stenosis or neural foraminal narrowing. L3-L4: No significant disc herniation. No spinal canal stenosis or neural foraminal narrowing. L4-L5: Right eccentric disc bulge and facet arthropathy result in severe narrowing of the right lateral recess with mass effect on the traversing right L5 nerve root in the subarticular zone. Associated edema and enhancement of the right L5 nerve root may be inflammatory or neoplastic in etiology. L5-S1: Degenerative endplate marrow edema along the right aspect of the L5-S1 disc space. IMPRESSION: 1. Edema and faint enhancement within the posterior aspect of the T5 vertebral body extending into the right pedicle, suspicious for possible metastatic disease. 2. Faint enhancement within the perineural cyst in the right neural foramen at T7-8 seen on sagittal image 3 series 24, which may be inflammatory or neoplastic in etiology. 3. Focal enhancement along the left aspect of the spinal cord at the L1 level, suspicious for metastasis along a nerve root. 4. Severe narrowing of the right lateral recess at L4-5 with mass effect on the traversing right L5 nerve root in the subarticular zone, with associated edema and enhancement of the right L5 nerve root, possibly inflammatory or neoplastic in etiology. Electronically signed by: Ryan Chess MD 10/30/2023 04:05 PM EDT RP Workstation: [id number]   MR Lumbar Spine W Wo Contrast Result Date: 10/30/2023 EXAM: MRI THORACIC AND LUMBAR SPINE WITH AND WITHOUT INTRAVENOUS CONTRAST 10/30/2023 03:20:29 PM TECHNIQUE: Multiplanar multisequence MRI of the thoracic and lumbar spine was performed with and without the administration of intravenous contrast. COMPARISON: PET/CT 10/22/2023 CLINICAL HISTORY: Metastatic disease evaluation. Small cell carcinoma of upper lobe of right lung. FINDINGS: BONES AND ALIGNMENT: Dextrocurvature of  the thoracic spine. No listhesis. Transitional lumbosacral anatomy with 6 non-rib-bearing, lumbar type vertebral bodies. Edema and faint enhancement within the posterior aspect of the T5 vertebral body extending into the right pedicle seen on sagittal image 13 of series 17, 18, 19 and 24, suspicious for possible metastatic disease. No suspicious marrow lesions. SPINAL CORD: Focal enhancement along the left aspect of the spinal cord at the L1 level best seen on axial image 46 series 23 and sagittal image 15 series 24, suspicious for metastasis along a nerve root. SOFT TISSUES: Faint enhancement within the perineural cyst in the right neural foramen at T7-8 seen on sagittal image 3 series 24, which may be inflammatory or neoplastic in etiology. THORACIC DISC LEVELS: No significant disc herniation. No spinal canal stenosis or neural foraminal narrowing. LUMBAR DISC LEVELS: L1-L2: No significant disc herniation. No spinal canal stenosis or neural foraminal narrowing. L2-L3: No significant disc herniation. No spinal canal stenosis or neural foraminal narrowing. L3-L4: No significant disc herniation. No spinal canal stenosis or neural foraminal narrowing. L4-L5: Right eccentric disc bulge and facet arthropathy result in severe narrowing of the right lateral recess with mass effect on the traversing right L5 nerve root in the subarticular zone. Associated edema and enhancement of the right L5 nerve root may be inflammatory or neoplastic  in etiology. L5-S1: Degenerative endplate marrow edema along the right aspect of the L5-S1 disc space. IMPRESSION: 1. Edema and faint enhancement within the posterior aspect of the T5 vertebral body extending into the right pedicle, suspicious for possible metastatic disease. 2. Faint enhancement within the perineural cyst in the right neural foramen at T7-8 seen on sagittal image 3 series 24, which may be inflammatory or neoplastic in etiology. 3. Focal enhancement along the left aspect of  the spinal cord at the L1 level, suspicious for metastasis along a nerve root. 4. Severe narrowing of the right lateral recess at L4-5 with mass effect on the traversing right L5 nerve root in the subarticular zone, with associated edema and enhancement of the right L5 nerve root, possibly inflammatory or neoplastic in etiology. Electronically signed by: Ryan Chess MD 10/30/2023 04:05 PM EDT RP Workstation: [id number]   NM PET Image Restage (PS) Skull Base to Thigh (F-18 FDG) Result Date: 10/25/2023 CLINICAL DATA:  Subsequent treatment strategy for small cell lung cancer. EXAM: NUCLEAR MEDICINE PET SKULL BASE TO THIGH TECHNIQUE: 6.2 mCi F-18 FDG was injected intravenously. Full-ring PET imaging was performed from the skull base to thigh after the radiotracer. CT data was obtained and used for attenuation correction and anatomic localization. Fasting blood glucose: 110 mg/dl COMPARISON:  88/[id number] FINDINGS: Mediastinal blood pool activity: SUV max 1.9 Liver activity: SUV max NA NECK: The patient has known tiny intracranial metastatic lesions based on/28/25 MRI but these are not readily apparent on PET-CT probably due to their small size. Dental related activity in the right maxilla left mandible. Incidental CT findings: Left common carotid atherosclerotic vascular calcification. CHEST: On the prior exam there is a hypermetabolic right hilar lymph node maximum SUV 6.6, currently this has a maximum SUV of 5.3. New hypermetabolic right paratracheal, AP window, and left hilar adenopathy compatible with active malignancy. This is new compared to the index anterior para right paratracheal node 1.0 cm in short axis on image 64 series 4, maximum SUV 8.6. Index AP window/left paratracheal lymph node 0.8 cm in short axis on image 72 series 4 with maximum SUV 6.6. The region of architectural distortion next to the fiducial medially in the right upper lobe on image 64 series 4 has a maximum SUV of 1.6, formerly 1.4.  Morphologically this appears similar. Incidental CT findings: Emphysema. Atherosclerotic vascular calcification of the thoracic aorta. ABDOMEN/PELVIS: Hyperintensity in the vicinity of the right adrenal gland without a well-defined mass, maximum SUV 5.0 and previously 2.5, suspicious for early metastatic disease to the right adrenal gland. Small nonspecific focus of hypermetabolic activity associated with a 0.6 by 0.2 cm subcutaneous nodule anterior to the right lower ribcage, maximum SUV 2.3, nonspecific. Incidental CT findings: Atherosclerosis is present, including aortoiliac atherosclerotic disease. SKELETON: Focus of hypermetabolic activity in the vicinity of the right T7 pedicle or T7-8 neural foramen/spinal canal, maximum SUV 3.3, no well-defined CT correlate. Focal hypermetabolic activity in the right lateral recess region at L4, questionably in the pedicle, maximum SUV 5.1. Along the posterior margin of the right inferior articular facet of L4 there is hypermetabolic activity without a well-defined CT correlate, maximum SUV 7.0. These are new from the prior exam and greater than I would expect for degenerative activity. Additional very faint foci of activity along the thoracolumbar spinal canal noted. Incidental CT findings: None. IMPRESSION: 1. New hypermetabolic mediastinal and left hilar adenopathy compatible with active malignancy. 2. New hypermetabolic activity in the vicinity of the right adrenal gland without a well-defined  mass, suspicious for early metastatic disease to the right adrenal gland. 3. New foci of hypermetabolic activity in the thoracolumbar spinal canal and potentially along the right T6 and L4 pedicles, without well-defined CT correlate, suspicious for osseous metastatic disease and possibly metastatic lesions in the spinal canal. This could be further characterized with dedicated MRI thoracic and lumbar spine with and without contrast. 4. Aortic Atherosclerosis (ICD10-I70.0) and  Emphysema (ICD10-J43.9). Electronically Signed   By: Ryan Salvage M.D.   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  The patient also has a suspicious left renal lesion concerning for renal cell carcinoma. He is status post a course of systemic chemotherapy initially with cisplatin  and etoposide  but the cisplatin  was discontinued secondary to renal insufficiency and the patient continued 3 more cycles of his systemic chemotherapy with carboplatin  and etoposide  concurrent with radiation.   He had evidence for disease recurrence with metastatic brain lesions in addition to right hilar lymphadenopathy treated with SRS as well as palliative radiotherapy to the right hilar area. He had a PET scan performed recently that showed evidence for disease progression in the mediastinal lymph nodes as well as right adrenal metastasis. He had bronchoscopy done recently by Dr. Shelah and the final pathology from the 2R and 4L lymph nodes showed malignant cells consistent with renal cell carcinoma. 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 Immunotherapy is preferred over chemotherapy due to its efficacy in renal cell carcinoma. Discussed potential for immunotherapy to cause inflammation in various organs, including the thyroid , skin, kidneys, liver, and heart. Emphasized that immunotherapy is not associated with the same side effects as chemotherapy, such as nausea, although it can still occur. Surgical options are not viable due to the extent of  metastasis. The patient understand that his condition is incurable and all the treatment is of palliative nature with the intention for prolong survival and palliation of his symptoms. - Initiate immunotherapy with nivolumab (Opdivo) and ipilimumab (Yervoy) every three weeks for four cycles. - Continue with nivolumab monotherapy every four weeks after initial four cycles. - Taper off Decadron : half a tablet twice a day for one week, then half a tablet once a day for one week, then stop. - Provide information about immunotherapy in the after-visit summary. - Send Emla  cream prescription to pharmacy for port site numbing before infusion.  Brain metastases, improving after radiation Brain metastases are improving following radiation therapy. He was advised to call immediately if he has any concerning symptoms in the interval. The patient voices understanding of current disease status and treatment options and is in agreement with the current care plan.  All questions were answered. The patient knows to call the clinic with any problems, questions or concerns. We can certainly see the patient much sooner if necessary. The total time spent in the appointment was 55 minutes including review of chart and various tests results, discussions about plan of care and coordination of care plan .   Disclaimer: This note was dictated with voice recognition software. Similar sounding words can inadvertently be transcribed and  may not be corrected upon review.

## 2023-11-19 ENCOUNTER — Other Ambulatory Visit: Payer: Self-pay

## 2023-11-19 ENCOUNTER — Ambulatory Visit
Admission: RE | Admit: 2023-11-19 | Discharge: 2023-11-19 | Disposition: A | Discharge status: Home / Self Care | Source: Ambulatory Visit | Attending: Radiation Oncology | Admitting: Radiation Oncology

## 2023-11-19 ENCOUNTER — Ambulatory Visit: Admitting: Emergency Medicine

## 2023-11-19 DIAGNOSIS — Z510 Encounter for antineoplastic radiation therapy: Secondary | ICD-10-CM | POA: Diagnosis not present

## 2023-11-19 LAB — RAD ONC ARIA SESSION SUMMARY
Course Elapsed Days: 2
Plan Fractions Treated to Date: 3
Plan Fractions Treated to Date: 3
Plan Prescribed Dose Per Fraction: 2.5 Gy
Plan Prescribed Dose Per Fraction: 2.5 Gy
Plan Total Fractions Prescribed: 15
Plan Total Fractions Prescribed: 15
Plan Total Prescribed Dose: 37.5 Gy
Plan Total Prescribed Dose: 37.5 Gy
Reference Point Dosage Given to Date: 7.5 Gy
Reference Point Dosage Given to Date: 7.5 Gy
Reference Point Session Dosage Given: 2.5 Gy
Reference Point Session Dosage Given: 2.5 Gy
Session Number: 3

## 2023-11-20 ENCOUNTER — Ambulatory Visit

## 2023-11-20 ENCOUNTER — Other Ambulatory Visit: Payer: Self-pay

## 2023-11-20 ENCOUNTER — Ambulatory Visit
Admission: RE | Admit: 2023-11-20 | Discharge: 2023-11-20 | Disposition: A | Discharge status: Home / Self Care | Source: Ambulatory Visit | Attending: Radiation Oncology | Admitting: Radiation Oncology

## 2023-11-20 ENCOUNTER — Encounter: Payer: Self-pay | Admitting: Internal Medicine

## 2023-11-20 DIAGNOSIS — Z510 Encounter for antineoplastic radiation therapy: Secondary | ICD-10-CM | POA: Diagnosis not present

## 2023-11-20 LAB — RAD ONC ARIA SESSION SUMMARY
Course Elapsed Days: 3
Plan Fractions Treated to Date: 4
Plan Fractions Treated to Date: 4
Plan Prescribed Dose Per Fraction: 2.5 Gy
Plan Prescribed Dose Per Fraction: 2.5 Gy
Plan Total Fractions Prescribed: 15
Plan Total Fractions Prescribed: 15
Plan Total Prescribed Dose: 37.5 Gy
Plan Total Prescribed Dose: 37.5 Gy
Reference Point Dosage Given to Date: 10 Gy
Reference Point Dosage Given to Date: 10 Gy
Reference Point Session Dosage Given: 2.5 Gy
Reference Point Session Dosage Given: 2.5 Gy
Session Number: 4

## 2023-11-20 NOTE — Telephone Encounter (Signed)
 Patient called and scheduled to come in 11/27/23

## 2023-11-23 ENCOUNTER — Ambulatory Visit
Admission: RE | Admit: 2023-11-23 | Discharge: 2023-11-23 | Disposition: A | Discharge status: Home / Self Care | Source: Ambulatory Visit | Attending: Radiation Oncology

## 2023-11-23 ENCOUNTER — Other Ambulatory Visit: Payer: Self-pay

## 2023-11-23 DIAGNOSIS — Z510 Encounter for antineoplastic radiation therapy: Secondary | ICD-10-CM | POA: Diagnosis not present

## 2023-11-23 LAB — RAD ONC ARIA SESSION SUMMARY
Course Elapsed Days: 6
Plan Fractions Treated to Date: 5
Plan Fractions Treated to Date: 5
Plan Prescribed Dose Per Fraction: 2.5 Gy
Plan Prescribed Dose Per Fraction: 2.5 Gy
Plan Total Fractions Prescribed: 15
Plan Total Fractions Prescribed: 15
Plan Total Prescribed Dose: 37.5 Gy
Plan Total Prescribed Dose: 37.5 Gy
Reference Point Dosage Given to Date: 12.5 Gy
Reference Point Dosage Given to Date: 12.5 Gy
Reference Point Session Dosage Given: 2.5 Gy
Reference Point Session Dosage Given: 2.5 Gy
Session Number: 5

## 2023-11-24 ENCOUNTER — Other Ambulatory Visit: Payer: Self-pay

## 2023-11-24 ENCOUNTER — Ambulatory Visit
Admission: RE | Admit: 2023-11-24 | Discharge: 2023-11-24 | Disposition: A | Discharge status: Home / Self Care | Source: Ambulatory Visit | Attending: Radiation Oncology | Admitting: Radiation Oncology

## 2023-11-24 DIAGNOSIS — Z510 Encounter for antineoplastic radiation therapy: Secondary | ICD-10-CM | POA: Diagnosis not present

## 2023-11-24 LAB — RAD ONC ARIA SESSION SUMMARY
Course Elapsed Days: 7
Plan Fractions Treated to Date: 6
Plan Fractions Treated to Date: 6
Plan Prescribed Dose Per Fraction: 2.5 Gy
Plan Prescribed Dose Per Fraction: 2.5 Gy
Plan Total Fractions Prescribed: 15
Plan Total Fractions Prescribed: 15
Plan Total Prescribed Dose: 37.5 Gy
Plan Total Prescribed Dose: 37.5 Gy
Reference Point Dosage Given to Date: 15 Gy
Reference Point Dosage Given to Date: 15 Gy
Reference Point Session Dosage Given: 2.5 Gy
Reference Point Session Dosage Given: 2.5 Gy
Session Number: 6

## 2023-11-25 ENCOUNTER — Inpatient Hospital Stay

## 2023-11-25 ENCOUNTER — Encounter: Payer: Self-pay | Admitting: Internal Medicine

## 2023-11-25 ENCOUNTER — Ambulatory Visit

## 2023-11-25 ENCOUNTER — Inpatient Hospital Stay: Admitting: Licensed Clinical Social Worker

## 2023-11-25 VITALS — BP 106/58 | HR 66 | Temp 98.1°F | Resp 18 | Wt 129.6 lb

## 2023-11-25 DIAGNOSIS — C642 Malignant neoplasm of left kidney, except renal pelvis: Secondary | ICD-10-CM

## 2023-11-25 DIAGNOSIS — Z510 Encounter for antineoplastic radiation therapy: Secondary | ICD-10-CM | POA: Diagnosis not present

## 2023-11-25 LAB — CBC WITH DIFFERENTIAL (CANCER CENTER ONLY)
Abs Immature Granulocytes: 0.03 K/uL (ref 0.00–0.07)
Basophils Absolute: 0 K/uL (ref 0.0–0.1)
Basophils Relative: 0 %
Eosinophils Absolute: 0.1 K/uL (ref 0.0–0.5)
Eosinophils Relative: 3 %
HCT: 31.7 % — ABNORMAL LOW (ref 39.0–52.0)
Hemoglobin: 10.9 g/dL — ABNORMAL LOW (ref 13.0–17.0)
Immature Granulocytes: 1 %
Lymphocytes Relative: 6 %
Lymphs Abs: 0.3 K/uL — ABNORMAL LOW (ref 0.7–4.0)
MCH: 32.2 pg (ref 26.0–34.0)
MCHC: 34.4 g/dL (ref 30.0–36.0)
MCV: 93.5 fL (ref 80.0–100.0)
Monocytes Absolute: 0.4 K/uL (ref 0.1–1.0)
Monocytes Relative: 8 %
Neutro Abs: 3.8 K/uL (ref 1.7–7.7)
Neutrophils Relative %: 82 %
Platelet Count: 170 K/uL (ref 150–400)
RBC: 3.39 MIL/uL — ABNORMAL LOW (ref 4.22–5.81)
RDW: 12.4 % (ref 11.5–15.5)
WBC Count: 4.6 K/uL (ref 4.0–10.5)
nRBC: 0 % (ref 0.0–0.2)

## 2023-11-25 LAB — CMP (CANCER CENTER ONLY)
ALT: 16 U/L (ref 0–44)
AST: 18 U/L (ref 15–41)
Albumin: 3.9 g/dL (ref 3.5–5.0)
Alkaline Phosphatase: 53 U/L (ref 38–126)
Anion gap: 10 (ref 5–15)
BUN: 17 mg/dL (ref 8–23)
CO2: 26 mmol/L (ref 22–32)
Calcium: 8.9 mg/dL (ref 8.9–10.3)
Chloride: 92 mmol/L — ABNORMAL LOW (ref 98–111)
Creatinine: 0.76 mg/dL (ref 0.61–1.24)
GFR, Estimated: >60 mL/min (ref >60)
Glucose, Bld: 99 mg/dL (ref 70–99)
Potassium: 4.2 mmol/L (ref 3.5–5.1)
Sodium: 128 mmol/L — ABNORMAL LOW (ref 135–145)
Total Bilirubin: 0.4 mg/dL (ref 0.0–1.2)
Total Protein: 6.1 g/dL — ABNORMAL LOW (ref 6.5–8.1)

## 2023-11-25 LAB — TSH: TSH: 1.23 u[IU]/mL (ref 0.350–4.500)

## 2023-11-25 MED ORDER — SODIUM CHLORIDE 0.9 % IV SOLN
3.0500 mg/kg | Freq: Once | INTRAVENOUS | Status: AC
  Administered 2023-11-25 (×2): 180 mg via INTRAVENOUS
  Filled 2023-11-25: qty 10

## 2023-11-25 MED ORDER — SODIUM CHLORIDE 0.9 % IV SOLN
1.0000 mg/kg | Freq: Once | INTRAVENOUS | Status: AC
  Administered 2023-11-25 (×2): 60 mg via INTRAVENOUS
  Filled 2023-11-25: qty 12

## 2023-11-25 MED ORDER — SODIUM CHLORIDE 0.9 % IV SOLN: INTRAVENOUS | Status: DC

## 2023-11-25 MED ORDER — FAMOTIDINE IN NACL 20-0.9 MG/50ML-% IV SOLN
20.0000 mg | Freq: Once | INTRAVENOUS | Status: AC
  Administered 2023-11-25 (×2): 20 mg via INTRAVENOUS
  Filled 2023-11-25: qty 50

## 2023-11-25 MED ORDER — DIPHENHYDRAMINE HCL 50 MG/ML IJ SOLN
25.0000 mg | Freq: Once | INTRAMUSCULAR | Status: AC
  Administered 2023-11-25 (×2): 25 mg via INTRAVENOUS
  Filled 2023-11-25: qty 1

## 2023-11-25 MED ORDER — SODIUM CHLORIDE 0.9% FLUSH: 10.0000 mL | INTRAVENOUS | Status: DC | PRN

## 2023-11-25 NOTE — Patient Instructions (Signed)

## 2023-11-25 NOTE — Progress Notes (Signed)
 Ipilimumab  (YERVOY ) Patient Monitoring Assessment   Is the patient experiencing any of the following general symptoms?:  [ ] Difficulty performing normal activities [ ] Feeling sluggish or cold all the time [ ] Unusual weight gain [ ] Constant or unusual headaches [ ] Feeling dizzy or faint [ ] Changes in eyesight (blurry vision, double vision, or other vision problems) [ ] Changes in mood or behavior (ex: decreased sex drive, irritability, or forgetfulness) [ ] Starting new medications (ex: steroids, other medications that lower immune response) [x ]Patient is not experiencing any of the general symptoms above.   Gastrointestinal  Patient is having 1 bowel movements each day.  Is this different from baseline? [ ] Yes [ ] No Are your stools watery or do they have a foul smell? [ ] Yes [ ] No Have you seen blood in your stools? [ ] Yes [ ] No Are your stools dark, tarry, or sticky? [ ] Yes [ ] No Are you having pain or tenderness in your belly? [ ] Yes [ ] No  Skin Does your skin itch? [ ] Yes [ x]No Do you have a rash? [ ] Yes [ x]No Has your skin blistered and/or peeled? [ ] Yes [ x]No Do you have sores in your mouth? [ ] Yes [ x]No  Hepatic Has your urine been dark or tea colored? [ ] Yes [ x]No Have you noticed that your skin or the whites of your eyes are turning yellow? [ ] Yes [ x]No Are you bleeding or bruising more easily than normal? [ ] Yes [ x]No Are you nauseous and/or vomiting? [ ] Yes [ x]No Do you have pain on the right side of your stomach? [ ] Yes [ x]No  Neurologic  Are you having unusual weakness of legs, arms, or face? [ ] Yes [ x]No Are you having numbness or tingling in your hands or feet? [ ] Yes [ x]No  Mazell Aylesworth

## 2023-11-25 NOTE — Patient Instructions (Signed)
 [facility] - A DEPT OF [facility]  Discharge Instructions: Thank you for choosing [facility] to provide your oncology and hematology care.   If you have a lab appointment with the Cancer Center, please go directly to the Cancer Center and check in at the registration area.  Wear comfortable clothing and clothing appropriate for easy access to any Portacath or PICC line.   We strive to give you quality time with your provider. You may need to reschedule your appointment if you arrive late (15 or more minutes).  Arriving late affects you and other patients whose appointments are after yours.  Also, if you miss three or more appointments without notifying the office, you may be dismissed from the clinic at the provider's discretion.      For prescription refill requests, have your pharmacy contact our office and allow 72 hours for refills to be completed.    Today you received the following chemotherapy and/or immunotherapy agents yervoy , opdivo       To help prevent nausea and vomiting after your treatment, we encourage you to take your nausea medication as directed.  BELOW ARE SYMPTOMS THAT SHOULD BE REPORTED IMMEDIATELY: *FEVER GREATER THAN 100.4 F (38 C) OR HIGHER *CHILLS OR SWEATING *NAUSEA AND VOMITING THAT IS NOT CONTROLLED WITH YOUR NAUSEA MEDICATION *UNUSUAL SHORTNESS OF BREATH *UNUSUAL BRUISING OR BLEEDING *URINARY PROBLEMS (pain or burning when urinating, or frequent urination) *BOWEL PROBLEMS (unusual diarrhea, constipation, pain near the anus) TENDERNESS IN MOUTH AND THROAT WITH OR WITHOUT PRESENCE OF ULCERS (sore throat, sores in mouth, or a toothache) UNUSUAL RASH, SWELLING OR PAIN  UNUSUAL VAGINAL DISCHARGE OR ITCHING   Items with * indicate a potential emergency and should be followed up as soon as possible or go to the Emergency Department if any problems should occur.  Please show the CHEMOTHERAPY ALERT CARD or  IMMUNOTHERAPY ALERT CARD at check-in to the Emergency Department and triage nurse. Should you have questions after your visit or need to cancel or reschedule your appointment, please contact [facility] - A DEPT OF [facility]  [phone] and follow the prompts.  Office hours are 8:00 a.m. to 4:30 p.m. Monday - Friday. Please note that voicemails left after 4:00 p.m. may not be returned until the following business day.  We are closed weekends and major holidays. You have access to a nurse at all times for urgent questions. Please call the main number to the clinic [phone] and follow the prompts.  For any non-urgent questions, you may also contact your provider using MyChart. We now offer e-Visits for anyone 24 and older to request care online for non-urgent symptoms. For details visit [email or web address].   Also download the MyChart app! Go to the app store, search MyChart, open the app, select [facility], and log in with your MyChart username and password.

## 2023-11-25 NOTE — Progress Notes (Signed)
 Pt reported to have stopped taking Dexamethasone  on 11/21/23.  Bridgett Leach [place], [place], BCPS, BCOP 11/25/2023 1:31 PM

## 2023-11-25 NOTE — Progress Notes (Signed)
 [facility] CSW Progress Note  Visual merchandiser met with patient during infusion to assess psychosocial needs.    Interventions: Provided patient with information about CSW role.  Patient appeared drowsy and engaged minimally.  He stated he has no needs at this time.       Follow Up Plan:  Patient will contact CSW with any support or resource needs    Macario CHRISTELLA Au, LCSW Clinical Social Worker [facility]

## 2023-11-26 ENCOUNTER — Ambulatory Visit
Admission: RE | Admit: 2023-11-26 | Discharge: 2023-11-26 | Disposition: A | Discharge status: Home / Self Care | Source: Ambulatory Visit | Attending: Radiation Oncology | Admitting: Radiation Oncology

## 2023-11-26 ENCOUNTER — Other Ambulatory Visit: Payer: Self-pay

## 2023-11-26 DIAGNOSIS — Z510 Encounter for antineoplastic radiation therapy: Secondary | ICD-10-CM | POA: Diagnosis not present

## 2023-11-26 LAB — RAD ONC ARIA SESSION SUMMARY
Course Elapsed Days: 9
Plan Fractions Treated to Date: 7
Plan Fractions Treated to Date: 7
Plan Prescribed Dose Per Fraction: 2.5 Gy
Plan Prescribed Dose Per Fraction: 2.5 Gy
Plan Total Fractions Prescribed: 15
Plan Total Fractions Prescribed: 15
Plan Total Prescribed Dose: 37.5 Gy
Plan Total Prescribed Dose: 37.5 Gy
Reference Point Dosage Given to Date: 17.5 Gy
Reference Point Dosage Given to Date: 17.5 Gy
Reference Point Session Dosage Given: 2.5 Gy
Reference Point Session Dosage Given: 2.5 Gy
Session Number: 7

## 2023-11-26 LAB — T4: T4, Total: 7.9 ug/dL (ref 4.5–12.0)

## 2023-11-27 ENCOUNTER — Ambulatory Visit
Admission: RE | Admit: 2023-11-27 | Discharge: 2023-11-27 | Disposition: A | Discharge status: Home / Self Care | Source: Ambulatory Visit | Attending: Radiation Oncology | Admitting: Radiation Oncology

## 2023-11-27 ENCOUNTER — Ambulatory Visit (INDEPENDENT_AMBULATORY_CARE_PROVIDER_SITE_OTHER): Admitting: Family

## 2023-11-27 ENCOUNTER — Other Ambulatory Visit: Payer: Self-pay

## 2023-11-27 VITALS — BP 108/56 | HR 84 | Temp 98.4°F | Resp 16 | Ht 73.0 in | Wt 130.0 lb

## 2023-11-27 DIAGNOSIS — H6123 Impacted cerumen, bilateral: Secondary | ICD-10-CM | POA: Insufficient documentation

## 2023-11-27 DIAGNOSIS — C642 Malignant neoplasm of left kidney, except renal pelvis: Secondary | ICD-10-CM | POA: Diagnosis not present

## 2023-11-27 DIAGNOSIS — E778 Other disorders of glycoprotein metabolism: Secondary | ICD-10-CM | POA: Insufficient documentation

## 2023-11-27 DIAGNOSIS — R972 Elevated prostate specific antigen [PSA]: Secondary | ICD-10-CM

## 2023-11-27 DIAGNOSIS — Z510 Encounter for antineoplastic radiation therapy: Secondary | ICD-10-CM | POA: Diagnosis not present

## 2023-11-27 LAB — RAD ONC ARIA SESSION SUMMARY
Course Elapsed Days: 10
Plan Fractions Treated to Date: 8
Plan Fractions Treated to Date: 8
Plan Prescribed Dose Per Fraction: 2.5 Gy
Plan Prescribed Dose Per Fraction: 2.5 Gy
Plan Total Fractions Prescribed: 15
Plan Total Fractions Prescribed: 15
Plan Total Prescribed Dose: 37.5 Gy
Plan Total Prescribed Dose: 37.5 Gy
Reference Point Dosage Given to Date: 20 Gy
Reference Point Dosage Given to Date: 20 Gy
Reference Point Session Dosage Given: 2.5 Gy
Reference Point Session Dosage Given: 2.5 Gy
Session Number: 8

## 2023-11-27 NOTE — Assessment & Plan Note (Signed)
  Edema likely due to hypoproteinemia with low protein levels causing fluid shift into tissues. No acute distress. - Check urine for proteinuria to rule out nephrotic syndrome. - Advise increasing dietary protein intake.

## 2023-11-27 NOTE — Progress Notes (Unsigned)
 =  Subjective:     Patient ID: Ian Hart, male    DOB: Jan 07, 1951, 73 y.o.   MRN: [id number]  Chief Complaint  Patient presents with   Ear Fullness    Patient complains of bilateral ear fullness    HPI  Discussed the use of AI scribe software for clinical note transcription with the patient, who gave verbal consent to proceed.  History of Present Illness  Ian Hart is a 73 year old male who presents with earwax impaction preventing hearing aid fitting.  He has seen two audiologists who identified earwax impaction as a barrier to fitting hearing aids. There is no ear pain.  He experiences bilateral foot swelling and is unsure of the cause.     Health Maintenance Due  Topic Date Due   Zoster Vaccines- Shingrix  (1 of 2) 06/06/1969   DTaP/Tdap/Td (2 - Td or Tdap) 11/09/2021   COVID-19 Vaccine (6 - 2024-25 season) 12/14/2022   INFLUENZA VACCINE  11/13/2023    Past Medical History:  Diagnosis Date   History of chicken pox    History of shingles 04/2010   Lung cancer (HCC) 02/2020    Past Surgical History:  Procedure Laterality Date   BRONCHIAL BIOPSY  03/22/2020   Procedure: BRONCHIAL BIOPSIES;  Surgeon: Brenna Adine CROME, DO;  Location: [facility];  Service: Pulmonary;;   BRONCHIAL BRUSHINGS  03/22/2020   Procedure: BRONCHIAL BRUSHINGS;  Surgeon: Brenna Adine CROME, DO;  Location: [facility];  Service: Pulmonary;;   BRONCHIAL NEEDLE ASPIRATION BIOPSY  03/22/2020   Procedure: BRONCHIAL NEEDLE ASPIRATION BIOPSIES;  Surgeon: Brenna Adine CROME, DO;  Location: [facility];  Service: Pulmonary;;   BRONCHIAL WASHINGS  03/22/2020   Procedure: BRONCHIAL WASHINGS;  Surgeon: Brenna Adine CROME, DO;  Location: [facility];  Service: Pulmonary;;   FIDUCIAL MARKER PLACEMENT  03/22/2020   Procedure: FIDUCIAL MARKER PLACEMENT;  Surgeon: Brenna Adine CROME, DO;  Location: [facility];  Service: Pulmonary;;   IR IMAGING GUIDED PORT INSERTION  11/05/2023   IR LUMBAR PUNCTURE   11/05/2023   ROBOTIC ASSITED PARTIAL NEPHRECTOMY Left 2022   VIDEO BRONCHOSCOPY WITH ENDOBRONCHIAL NAVIGATION N/A 03/22/2020   Procedure: VIDEO BRONCHOSCOPY WITH ENDOBRONCHIAL NAVIGATION;  Surgeon: Brenna Adine CROME, DO;  Location: [facility];  Service: Pulmonary;  Laterality: N/A;   VIDEO BRONCHOSCOPY WITH ENDOBRONCHIAL ULTRASOUND N/A 03/22/2020   Procedure: VIDEO BRONCHOSCOPY WITH ENDOBRONCHIAL ULTRASOUND;  Surgeon: Brenna Adine CROME, DO;  Location: [facility];  Service: Pulmonary;  Laterality: N/A;   VIDEO BRONCHOSCOPY WITH ENDOBRONCHIAL ULTRASOUND Bilateral 11/10/2023   Procedure: BRONCHOSCOPY, WITH EBUS;  Surgeon: Shelah Lamar RAMAN, MD;  Location: [facility] END[facility]COPY;  Service: Cardiopulmonary;  Laterality: Bilateral;    Family History  Problem Relation Age of Onset   Sudden death Mother    Rheumatic fever Mother    COPD Brother    Liver disease Neg Hx    Colon cancer Neg Hx    Esophageal cancer Neg Hx     Social History   Socioeconomic History   Marital status: Married    Spouse name: Not on file   Number of children: 1   Years of education: Not on file   Highest education level: Associate degree: academic program  Occupational History   Occupation: retired  Tobacco Use   Smoking status: Former    Current packs/day: 0.00    Average packs/day: 2.0 packs/day for 50.0 years (100.0 ttl pk-yrs)    Types: Cigarettes    Quit date: 07/13/2021    Years  since quitting: 2.3   Smokeless tobacco: Never   Tobacco comments:    07/25/20  Vaping Use   Vaping status: Never Used  Substance and Sexual Activity   Alcohol use: Yes    Alcohol/week: 10.0 standard drinks of alcohol    Types: 10 Standard drinks or equivalent per week    Comment: 2 drinks per night   Drug use: Not Currently   Sexual activity: Yes  Other Topics Concern   Not on file  Social History Narrative   Regular exercise:  2-3 x weekly (sports, yardwork)   Caffeine use:  3 cups coffee daily   43 yr old son   Wife    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           Social Drivers of Corporate investment banker Strain: Low Risk  (08/07/2023)   Overall Financial Resource Strain (CARDIA)    Difficulty of Paying Living Expenses: Not hard at all  Food Insecurity: No Food Insecurity (09/11/2023)   Hunger Vital Sign    Worried About Running Out of Food in the Last Year: Never true    Ran Out of Food in the Last Year: Never true  Transportation Needs: No Transportation Needs (09/11/2023)   PRAPARE - Administrator, Civil Service (Medical): No    Lack of Transportation (Non-Medical): No  Physical Activity: Unknown (10/05/2023)   Exercise Vital Sign    Days of Exercise per Week: 6 days    Minutes of Exercise per Session: Not on file  Stress: Stress Concern Present (08/07/2023)   [employer] of Occupational Health - Occupational Stress Questionnaire    Feeling of Stress : To some extent  Social Connections: Socially Integrated (08/07/2023)   Social Connection and Isolation Panel    Frequency of Communication with Friends and Family: More than three times a week    Frequency of Social Gatherings with Friends and Family: Once a week    Attends Religious Services: More than 4 times per year    Active Member of [employer] or Organizations: Yes    Attends Engineer, structural: More than 4 times per year    Marital Status: Married  Catering manager Violence: Not At Risk (09/11/2023)   Humiliation, Afraid, Rape, and Kick questionnaire    Fear of Current or Ex-Partner: No    Emotionally Abused: No    Physically Abused: No    Sexually Abused: No    Outpatient Medications Prior to Visit  Medication Sig Dispense Refill   alendronate  (FOSAMAX ) 70 MG tablet Take 1 tablet (70 mg total) by mouth every 7 (seven) days. Take with a full glass of water on an empty stomach. 12 tablet 4   Calcium Carbonate-Vit D-Min (CALTRATE 600+D PLUS MINERALS) 600-800  MG-UNIT TABS Take 1 tablet by mouth in the morning and at bedtime.     lidocaine -prilocaine  (EMLA ) cream Apply to affected area once 30 g 3   LORazepam  (ATIVAN ) 0.5 MG tablet 1 tab po 30 minutes prior to radiation or MRI scans 10 tablet 0   Multiple Vitamin (MULTIVITAMIN) tablet Take 1 tablet by mouth daily.     ondansetron  (ZOFRAN ) 8 MG tablet Take 1 tablet (8 mg total) by mouth every 8 (eight) hours as needed for nausea or vomiting. 30 tablet 1   prochlorperazine  (COMPAZINE ) 10 MG tablet Take 1 tablet (10 mg total) by mouth every 6 (six) hours as needed for nausea  or vomiting. 30 tablet 1   tamsulosin  (FLOMAX ) 0.4 MG CAPS capsule Take 1 capsule (0.4 mg total) by mouth daily. 90 capsule 1   Zoster Vaccine Adjuvanted (SHINGRIX ) injection 0.5ml IM now and again in 2-6 months 0.5 mL 1   dexamethasone  (DECADRON ) 4 MG tablet Take 1 tablet (4 mg total) by mouth 2 (two) times daily. 90 tablet 1   meloxicam  (MOBIC ) 7.5 MG tablet Take 1 tablet (7.5 mg total) by mouth daily. 14 tablet 0   No facility-administered medications prior to visit.    Allergies  Allergen Reactions   Acyclovir And Related Rash    Rash that looked like chicken pox    ROS     Objective:    Physical Exam Constitutional:      General: He is not in acute distress.    Appearance: He is well-developed.  HENT:     Head: Normocephalic and atraumatic.  Cardiovascular:     Rate and Rhythm: Normal rate.  Pulmonary:     Effort: Pulmonary effort is normal.  Musculoskeletal:     Right lower leg: 2+ Edema present.     Left lower leg: 2+ Edema present.  Skin:    General: Skin is warm and dry.  Neurological:     Mental Status: He is alert and oriented to person, place, and time.  Psychiatric:        Behavior: Behavior normal.        Thought Content: Thought content normal.      BP (!) 108/56 (BP Location: Right Arm, Patient Position: Sitting, Cuff Size: Normal)   Pulse 84   Temp 98.4 F (36.9 C) (Oral)   Resp 16    Ht 6' 1 (1.854 m)   Wt 130 lb (59 kg)   SpO2 99%   BMI 17.15 kg/m  Wt Readings from Last 3 Encounters:  11/27/23 130 lb (59 kg)  11/25/23 129 lb 9.6 oz (58.8 kg)  11/18/23 130 lb (59 kg)       Assessment & Plan:   Problem List Items Addressed This Visit       Unprioritized   Hypoproteinemia (HCC) - Primary    Edema likely due to hypoproteinemia with low protein levels causing fluid shift into tissues. No acute distress. - Check urine for proteinuria to rule out nephrotic syndrome. - Advise increasing dietary protein intake.      Relevant Orders   Urinalysis, Routine w reflex microscopic   Elevated PSA   He is overdue for follow up with Urology but tells me he will schedule it.       Ceruminosis, bilateral   After verbal consent, both ears were irrigated by CMA. Cerumen was completed removed in both ears. Pt tolerated procedure well.      Lab Results  Component Value Date   PSA 5.24 (H) 10/06/2023   PSA 4.53 (H) 07/30/2022   PSA 2.31 02/03/2014       I have discontinued Taytum J. Sedore's meloxicam  and dexamethasone . I am also having him maintain his multivitamin, Shingrix , LORazepam , alendronate , Caltrate 600+D Plus Minerals, tamsulosin , prochlorperazine , ondansetron , and lidocaine -prilocaine .  No orders of the defined types were placed in this encounter.

## 2023-11-27 NOTE — Assessment & Plan Note (Signed)
 He is overdue for follow up with Urology but tells me he will schedule it.

## 2023-11-27 NOTE — Assessment & Plan Note (Signed)
 New diagnosis for patient.

## 2023-11-27 NOTE — Patient Instructions (Signed)
 VISIT SUMMARY:  You visited us  today because of earwax buildup that is preventing the fitting of your hearing aids and swelling in your feet. We also discussed your ongoing treatment for prostate cancer.  YOUR PLAN:  BILATERAL LOWER EXTREMITY EDEMA AND HYPOPROTEINEMIA: The swelling in your feet is likely due to low protein levels, which can cause fluid to shift into your tissues. -We will check your urine for protein to rule out nephrotic syndrome. -Increase your dietary protein intake.  IMPACTED CERUMEN, BILATERAL: You have earwax buildup in both ears that is preventing the fitting of your hearing aids. -We will use a cerumenolytic spray to help remove the earwax. Alroy will assist with the earwax removal.

## 2023-11-27 NOTE — Assessment & Plan Note (Signed)
 After verbal consent, both ears were irrigated by CMA. Cerumen was completed removed in both ears. Pt tolerated procedure well.

## 2023-11-30 ENCOUNTER — Other Ambulatory Visit: Payer: Self-pay

## 2023-11-30 ENCOUNTER — Ambulatory Visit
Admission: RE | Admit: 2023-11-30 | Discharge: 2023-11-30 | Disposition: A | Discharge status: Home / Self Care | Source: Ambulatory Visit | Attending: Radiation Oncology | Admitting: Radiation Oncology

## 2023-11-30 DIAGNOSIS — Z510 Encounter for antineoplastic radiation therapy: Secondary | ICD-10-CM | POA: Diagnosis not present

## 2023-11-30 LAB — RAD ONC ARIA SESSION SUMMARY
Course Elapsed Days: 13
Plan Fractions Treated to Date: 9
Plan Fractions Treated to Date: 9
Plan Prescribed Dose Per Fraction: 2.5 Gy
Plan Prescribed Dose Per Fraction: 2.5 Gy
Plan Total Fractions Prescribed: 15
Plan Total Fractions Prescribed: 15
Plan Total Prescribed Dose: 37.5 Gy
Plan Total Prescribed Dose: 37.5 Gy
Reference Point Dosage Given to Date: 22.5 Gy
Reference Point Dosage Given to Date: 22.5 Gy
Reference Point Session Dosage Given: 2.5 Gy
Reference Point Session Dosage Given: 2.5 Gy
Session Number: 9

## 2023-12-01 ENCOUNTER — Other Ambulatory Visit: Payer: Self-pay

## 2023-12-01 ENCOUNTER — Ambulatory Visit
Admission: RE | Admit: 2023-12-01 | Discharge: 2023-12-01 | Disposition: A | Discharge status: Home / Self Care | Source: Ambulatory Visit | Attending: Radiation Oncology

## 2023-12-01 DIAGNOSIS — Z510 Encounter for antineoplastic radiation therapy: Secondary | ICD-10-CM | POA: Diagnosis not present

## 2023-12-01 LAB — RAD ONC ARIA SESSION SUMMARY
Course Elapsed Days: 14
Plan Fractions Treated to Date: 10
Plan Fractions Treated to Date: 10
Plan Prescribed Dose Per Fraction: 2.5 Gy
Plan Prescribed Dose Per Fraction: 2.5 Gy
Plan Total Fractions Prescribed: 15
Plan Total Fractions Prescribed: 15
Plan Total Prescribed Dose: 37.5 Gy
Plan Total Prescribed Dose: 37.5 Gy
Reference Point Dosage Given to Date: 25 Gy
Reference Point Dosage Given to Date: 25 Gy
Reference Point Session Dosage Given: 2.5 Gy
Reference Point Session Dosage Given: 2.5 Gy
Session Number: 10

## 2023-12-02 ENCOUNTER — Ambulatory Visit
Admission: RE | Admit: 2023-12-02 | Discharge: 2023-12-02 | Disposition: A | Discharge status: Home / Self Care | Source: Ambulatory Visit | Attending: Radiation Oncology | Admitting: Radiation Oncology

## 2023-12-02 ENCOUNTER — Other Ambulatory Visit: Payer: Self-pay

## 2023-12-02 DIAGNOSIS — Z510 Encounter for antineoplastic radiation therapy: Secondary | ICD-10-CM | POA: Diagnosis not present

## 2023-12-02 LAB — RAD ONC ARIA SESSION SUMMARY
Course Elapsed Days: 15
Plan Fractions Treated to Date: 11
Plan Fractions Treated to Date: 11
Plan Prescribed Dose Per Fraction: 2.5 Gy
Plan Prescribed Dose Per Fraction: 2.5 Gy
Plan Total Fractions Prescribed: 15
Plan Total Fractions Prescribed: 15
Plan Total Prescribed Dose: 37.5 Gy
Plan Total Prescribed Dose: 37.5 Gy
Reference Point Dosage Given to Date: 27.5 Gy
Reference Point Dosage Given to Date: 27.5 Gy
Reference Point Session Dosage Given: 2.5 Gy
Reference Point Session Dosage Given: 2.5 Gy
Session Number: 11

## 2023-12-03 ENCOUNTER — Ambulatory Visit
Admission: RE | Admit: 2023-12-03 | Discharge: 2023-12-03 | Disposition: A | Discharge status: Home / Self Care | Source: Ambulatory Visit | Attending: Radiation Oncology | Admitting: Radiation Oncology

## 2023-12-03 ENCOUNTER — Other Ambulatory Visit: Payer: Self-pay

## 2023-12-03 DIAGNOSIS — Z510 Encounter for antineoplastic radiation therapy: Secondary | ICD-10-CM | POA: Diagnosis not present

## 2023-12-03 LAB — RAD ONC ARIA SESSION SUMMARY
Course Elapsed Days: 16
Plan Fractions Treated to Date: 12
Plan Fractions Treated to Date: 12
Plan Prescribed Dose Per Fraction: 2.5 Gy
Plan Prescribed Dose Per Fraction: 2.5 Gy
Plan Total Fractions Prescribed: 15
Plan Total Fractions Prescribed: 15
Plan Total Prescribed Dose: 37.5 Gy
Plan Total Prescribed Dose: 37.5 Gy
Reference Point Dosage Given to Date: 30 Gy
Reference Point Dosage Given to Date: 30 Gy
Reference Point Session Dosage Given: 2.5 Gy
Reference Point Session Dosage Given: 2.5 Gy
Session Number: 12

## 2023-12-04 ENCOUNTER — Ambulatory Visit

## 2023-12-04 ENCOUNTER — Ambulatory Visit
Admission: RE | Admit: 2023-12-04 | Discharge: 2023-12-04 | Disposition: A | Discharge status: Home / Self Care | Source: Ambulatory Visit | Attending: Radiation Oncology | Admitting: Radiation Oncology

## 2023-12-04 ENCOUNTER — Other Ambulatory Visit: Payer: Self-pay

## 2023-12-04 DIAGNOSIS — Z510 Encounter for antineoplastic radiation therapy: Secondary | ICD-10-CM | POA: Diagnosis not present

## 2023-12-04 LAB — RAD ONC ARIA SESSION SUMMARY
Course Elapsed Days: 17
Plan Fractions Treated to Date: 13
Plan Fractions Treated to Date: 13
Plan Prescribed Dose Per Fraction: 2.5 Gy
Plan Prescribed Dose Per Fraction: 2.5 Gy
Plan Total Fractions Prescribed: 15
Plan Total Fractions Prescribed: 15
Plan Total Prescribed Dose: 37.5 Gy
Plan Total Prescribed Dose: 37.5 Gy
Reference Point Dosage Given to Date: 32.5 Gy
Reference Point Dosage Given to Date: 32.5 Gy
Reference Point Session Dosage Given: 2.5 Gy
Reference Point Session Dosage Given: 2.5 Gy
Session Number: 13

## 2023-12-07 ENCOUNTER — Other Ambulatory Visit: Payer: Self-pay

## 2023-12-07 ENCOUNTER — Encounter: Payer: Self-pay | Admitting: Internal Medicine

## 2023-12-07 ENCOUNTER — Encounter: Payer: Self-pay | Admitting: Radiation Oncology

## 2023-12-07 ENCOUNTER — Ambulatory Visit
Admission: RE | Admit: 2023-12-07 | Discharge: 2023-12-07 | Disposition: A | Discharge status: Home / Self Care | Source: Ambulatory Visit | Attending: Radiation Oncology | Admitting: Radiation Oncology

## 2023-12-07 DIAGNOSIS — Z510 Encounter for antineoplastic radiation therapy: Secondary | ICD-10-CM | POA: Diagnosis not present

## 2023-12-07 LAB — RAD ONC ARIA SESSION SUMMARY
Course Elapsed Days: 20
Plan Fractions Treated to Date: 14
Plan Fractions Treated to Date: 14
Plan Prescribed Dose Per Fraction: 2.5 Gy
Plan Prescribed Dose Per Fraction: 2.5 Gy
Plan Total Fractions Prescribed: 15
Plan Total Fractions Prescribed: 15
Plan Total Prescribed Dose: 37.5 Gy
Plan Total Prescribed Dose: 37.5 Gy
Reference Point Dosage Given to Date: 35 Gy
Reference Point Dosage Given to Date: 35 Gy
Reference Point Session Dosage Given: 2.5 Gy
Reference Point Session Dosage Given: 2.5 Gy
Session Number: 14

## 2023-12-08 ENCOUNTER — Other Ambulatory Visit: Payer: Self-pay

## 2023-12-08 ENCOUNTER — Ambulatory Visit
Admission: RE | Admit: 2023-12-08 | Discharge: 2023-12-08 | Disposition: A | Discharge status: Home / Self Care | Source: Ambulatory Visit | Attending: Radiation Oncology | Admitting: Radiation Oncology

## 2023-12-08 ENCOUNTER — Ambulatory Visit
Admission: RE | Admit: 2023-12-08 | Discharge: 2023-12-08 | Disposition: A | Discharge status: Home / Self Care | Source: Ambulatory Visit | Attending: Radiation Oncology

## 2023-12-08 DIAGNOSIS — Z510 Encounter for antineoplastic radiation therapy: Secondary | ICD-10-CM | POA: Diagnosis not present

## 2023-12-08 LAB — RAD ONC ARIA SESSION SUMMARY
Course Elapsed Days: 21
Plan Fractions Treated to Date: 15
Plan Fractions Treated to Date: 15
Plan Prescribed Dose Per Fraction: 2.5 Gy
Plan Prescribed Dose Per Fraction: 2.5 Gy
Plan Total Fractions Prescribed: 15
Plan Total Fractions Prescribed: 15
Plan Total Prescribed Dose: 37.5 Gy
Plan Total Prescribed Dose: 37.5 Gy
Reference Point Dosage Given to Date: 37.5 Gy
Reference Point Dosage Given to Date: 37.5 Gy
Reference Point Session Dosage Given: 2.5 Gy
Reference Point Session Dosage Given: 2.5 Gy
Session Number: 15

## 2023-12-11 NOTE — Radiation Completion Notes (Addendum)
  Radiation Oncology         [phone]) [phone] ________________________________  Name: Ian Hart MRN: [id number]  Date of Service: 12/08/2023  DOB: 1950/10/17  End of Treatment Note   Diagnosis: Recurrent Metastatic Stage IIIA, [id number], mixed histology NSCLC and small cell carcinoma and metastatic renal cell carcinoma.   Intent: Palliative     ==========DELIVERED PLANS==========  First Treatment Date: 2023-11-17 Last Treatment Date: 2023-12-08   Plan Name: Spine_L1 Site: Lumbar Spine Technique: 3D Mode: Photon Dose Per Fraction: 2.5 Gy Prescribed Dose (Delivered / Prescribed): 37.5 Gy / 37.5 Gy Prescribed Fxs (Delivered / Prescribed): 15 / 15   Plan Name: Spine_L5 Site: Lumbar Spine Technique: 3D Mode: Photon Dose Per Fraction: 2.5 Gy Prescribed Dose (Delivered / Prescribed): 37.5 Gy / 37.5 Gy Prescribed Fxs (Delivered / Prescribed): 15 / 15     ==========ON TREATMENT VISIT DATES========== 2023-11-20, 2023-11-27, 2023-12-08    See weekly On Treatment Notes in Epic for details in the Media tab (listed as Progress notes on the On Treatment Visit Dates listed above). The patient tolerated radiation. He developed fatigue and a skin rash suspected to be from immunotherapy.  The patient will receive a call in about one month from the radiation oncology department. He will continue follow up with Dr. Sherrod as well as by the brain oncology program given his other CNS therapies.     Donald KYM Husband, PAC

## 2023-12-15 ENCOUNTER — Other Ambulatory Visit

## 2023-12-15 ENCOUNTER — Ambulatory Visit: Admitting: Internal Medicine

## 2023-12-15 ENCOUNTER — Encounter: Payer: Self-pay | Admitting: Physician Assistant

## 2023-12-15 NOTE — Progress Notes (Signed)
 Known pt in our clinic currently receiving radiation to spine c/o rash of lower extremities. Pt also receiving immunotherapy. Noted the rash to be macular and affecting bilateral thighs, lower legs, and low trunk at the level of the waist described as pruritic. Expected hyperpigmentation also noted above at level of L5 region consistent with radiation. Notified medical oncology and nursing communicated antihistamine recommendations from their clinic to the patient and they will see if needed.

## 2023-12-16 ENCOUNTER — Inpatient Hospital Stay (HOSPITAL_BASED_OUTPATIENT_CLINIC_OR_DEPARTMENT_OTHER): Admitting: Internal Medicine

## 2023-12-16 ENCOUNTER — Inpatient Hospital Stay: Attending: Radiation Oncology

## 2023-12-16 ENCOUNTER — Ambulatory Visit

## 2023-12-16 VITALS — BP 98/63 | HR 92 | Temp 98.3°F | Resp 17 | Ht 73.0 in | Wt 124.8 lb

## 2023-12-16 DIAGNOSIS — Z5112 Encounter for antineoplastic immunotherapy: Secondary | ICD-10-CM | POA: Diagnosis present

## 2023-12-16 DIAGNOSIS — R630 Anorexia: Secondary | ICD-10-CM | POA: Diagnosis not present

## 2023-12-16 DIAGNOSIS — C7971 Secondary malignant neoplasm of right adrenal gland: Secondary | ICD-10-CM | POA: Insufficient documentation

## 2023-12-16 DIAGNOSIS — R000 Tachycardia, unspecified: Secondary | ICD-10-CM | POA: Diagnosis not present

## 2023-12-16 DIAGNOSIS — C642 Malignant neoplasm of left kidney, except renal pelvis: Secondary | ICD-10-CM | POA: Diagnosis not present

## 2023-12-16 DIAGNOSIS — C342 Malignant neoplasm of middle lobe, bronchus or lung: Secondary | ICD-10-CM | POA: Diagnosis present

## 2023-12-16 DIAGNOSIS — F17210 Nicotine dependence, cigarettes, uncomplicated: Secondary | ICD-10-CM | POA: Diagnosis not present

## 2023-12-16 DIAGNOSIS — C7931 Secondary malignant neoplasm of brain: Secondary | ICD-10-CM | POA: Insufficient documentation

## 2023-12-16 LAB — CBC WITH DIFFERENTIAL (CANCER CENTER ONLY)
Abs Immature Granulocytes: 0.01 K/uL (ref 0.00–0.07)
Basophils Absolute: 0 K/uL (ref 0.0–0.1)
Basophils Relative: 1 %
Eosinophils Absolute: 0.2 K/uL (ref 0.0–0.5)
Eosinophils Relative: 7 %
HCT: 30.2 % — ABNORMAL LOW (ref 39.0–52.0)
Hemoglobin: 10.3 g/dL — ABNORMAL LOW (ref 13.0–17.0)
Immature Granulocytes: 0 %
Lymphocytes Relative: 11 %
Lymphs Abs: 0.3 K/uL — ABNORMAL LOW (ref 0.7–4.0)
MCH: 31.2 pg (ref 26.0–34.0)
MCHC: 34.1 g/dL (ref 30.0–36.0)
MCV: 91.5 fL (ref 80.0–100.0)
Monocytes Absolute: 0.9 K/uL (ref 0.1–1.0)
Monocytes Relative: 31 %
Neutro Abs: 1.4 K/uL — ABNORMAL LOW (ref 1.7–7.7)
Neutrophils Relative %: 50 %
Platelet Count: 167 K/uL (ref 150–400)
RBC: 3.3 MIL/uL — ABNORMAL LOW (ref 4.22–5.81)
RDW: 13 % (ref 11.5–15.5)
WBC Count: 2.9 K/uL — ABNORMAL LOW (ref 4.0–10.5)
nRBC: 0 % (ref 0.0–0.2)

## 2023-12-16 LAB — CMP (CANCER CENTER ONLY)
ALT: 25 U/L (ref 0–44)
AST: 22 U/L (ref 15–41)
Albumin: 3.5 g/dL (ref 3.5–5.0)
Alkaline Phosphatase: 88 U/L (ref 38–126)
Anion gap: 6 (ref 5–15)
BUN: 18 mg/dL (ref 8–23)
CO2: 27 mmol/L (ref 22–32)
Calcium: 8.5 mg/dL — ABNORMAL LOW (ref 8.9–10.3)
Chloride: 102 mmol/L (ref 98–111)
Creatinine: 0.91 mg/dL (ref 0.61–1.24)
GFR, Estimated: >60 mL/min (ref >60)
Glucose, Bld: 108 mg/dL — ABNORMAL HIGH (ref 70–99)
Potassium: 4.1 mmol/L (ref 3.5–5.1)
Sodium: 135 mmol/L (ref 135–145)
Total Bilirubin: 0.4 mg/dL (ref 0.0–1.2)
Total Protein: 6.1 g/dL — ABNORMAL LOW (ref 6.5–8.1)

## 2023-12-16 NOTE — Progress Notes (Signed)
 [facility] [phone]   Fax:([phone]) [phone]  OFFICE PROGRESS NOTE  Ian Setter, NP 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    PRIOR THERAPY: Systemic chemotherapy/radiation with cisplatin  80 mg per metered squared on day 1 and etoposide  100 mg per metered squared on days 1, 2, and 3 IV every 3 weeks.  Status post 4 cycles.  Starting from cycle #3, cisplatin  was changed to carboplatin  for an AUC of 5 due to renal insufficiency. First dose on 04/20/20. Onpro was added to the treatment plan starting from cycle #3 due to pancytopenia.     CURRENT THERAPY: First-line treatment with immunotherapy with ipilimumab  1 Mg/KG in addition to nivolumab  360 Mg IV every 3 weeks for 4 cycles followed by maintenance treatment with nivolumab  480 Mg IV every 4 weeks.  First dose of treatment November 25, 2023.  Status post 1 cycle.  INTERVAL HISTORY: Ian Hart 73 y.o. male returns to the clinic today for follow-up visit accompanied by his wife.Discussed the use of AI scribe software for clinical note transcription with the patient, who gave verbal consent to proceed.  History of Present Illness Ian Hart is a 73 year old male with metastatic clear cell renal cell carcinoma who presents for evaluation before starting cycle number two of immunotherapy. He is accompanied by his wife.  He is currently receiving immunotherapy for metastatic clear cell renal cell carcinoma of the left kidney, diagnosed in July 2025. He has completed one cycle of ipilimumab  and nivolumab  and is here for evaluation before starting the second cycle.  He has experienced diarrhea for three weeks,  occurring a couple of times a day, and hives for four weeks, primarily on his back, thighs, and side. The hives are described as small areas that are scabbing over. He has been using hydrocortisone cream and over-the-counter Claritin and Pepcid  for symptom management. Despite these measures, Imodium taken for diarrhea has not been effective, as he has used a whole box, taking about three tablets a day.  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 He mentions a weight loss of about five pounds recently, from 129 pounds to 124.8 pounds. He tries to snack between meals to manage his weight. He also reports swelling in his ankles, which he manages by elevating his legs at the end of the day.  He and his wife are planning a vacation to [place] next week.    MEDICAL HISTORY: Past Medical History:  Diagnosis Date   History of chicken pox    History of shingles 04/2010   Lung cancer (HCC) 02/2020    ALLERGIES:  is allergic to acyclovir and related.  MEDICATIONS:  Current Outpatient Medications  Medication Sig Dispense Refill   alendronate  (FOSAMAX ) 70 MG tablet Take 1 tablet (70 mg total) by mouth every 7 (seven) days. Take with a full glass of water on an empty stomach. 12 tablet 4   Calcium Carbonate-Vit D-Min (CALTRATE 600+D PLUS MINERALS) 600-800 MG-UNIT TABS Take 1 tablet by mouth in the morning and at bedtime.     lidocaine -prilocaine  (EMLA ) cream  Apply to affected area once 30 g 3   LORazepam  (ATIVAN ) 0.5 MG tablet 1 tab po 30 minutes prior to radiation or MRI scans 10 tablet 0   Multiple Vitamin (MULTIVITAMIN) tablet Take 1 tablet by mouth daily.     ondansetron  (ZOFRAN ) 8 MG tablet Take 1 tablet (8 mg total) by mouth every 8 (eight) hours as needed for nausea or vomiting. 30 tablet 1   prochlorperazine  (COMPAZINE ) 10 MG tablet Take 1 tablet (10 mg total) by mouth  every 6 (six) hours as needed for nausea or vomiting. 30 tablet 1   tamsulosin  (FLOMAX ) 0.4 MG CAPS capsule Take 1 capsule (0.4 mg total) by mouth daily. 90 capsule 1   Zoster Vaccine Adjuvanted (SHINGRIX ) injection 0.5ml IM now and again in 2-6 months 0.5 mL 1   No current facility-administered medications for this visit.    SURGICAL HISTORY:  Past Surgical History:  Procedure Laterality Date   BRONCHIAL BIOPSY  03/22/2020   Procedure: BRONCHIAL BIOPSIES;  Surgeon: Brenna Adine CROME, DO;  Location: [facility];  Service: Pulmonary;;   BRONCHIAL BRUSHINGS  03/22/2020   Procedure: BRONCHIAL BRUSHINGS;  Surgeon: Brenna Adine CROME, DO;  Location: [facility];  Service: Pulmonary;;   BRONCHIAL NEEDLE ASPIRATION BIOPSY  03/22/2020   Procedure: BRONCHIAL NEEDLE ASPIRATION BIOPSIES;  Surgeon: Brenna Adine CROME, DO;  Location: [facility];  Service: Pulmonary;;   BRONCHIAL WASHINGS  03/22/2020   Procedure: BRONCHIAL WASHINGS;  Surgeon: Brenna Adine CROME, DO;  Location: [facility];  Service: Pulmonary;;   FIDUCIAL MARKER PLACEMENT  03/22/2020   Procedure: FIDUCIAL MARKER PLACEMENT;  Surgeon: Brenna Adine CROME, DO;  Location: [facility];  Service: Pulmonary;;   IR IMAGING GUIDED PORT INSERTION  11/05/2023   IR LUMBAR PUNCTURE  11/05/2023   ROBOTIC ASSITED PARTIAL NEPHRECTOMY Left 2022   VIDEO BRONCHOSCOPY WITH ENDOBRONCHIAL NAVIGATION N/A 03/22/2020   Procedure: VIDEO BRONCHOSCOPY WITH ENDOBRONCHIAL NAVIGATION;  Surgeon: Brenna Adine CROME, DO;  Location: [facility];  Service: Pulmonary;  Laterality: N/A;   VIDEO BRONCHOSCOPY WITH ENDOBRONCHIAL ULTRASOUND N/A 03/22/2020   Procedure: VIDEO BRONCHOSCOPY WITH ENDOBRONCHIAL ULTRASOUND;  Surgeon: Brenna Adine CROME, DO;  Location: [facility];  Service: Pulmonary;  Laterality: N/A;   VIDEO BRONCHOSCOPY WITH ENDOBRONCHIAL ULTRASOUND Bilateral 11/10/2023   Procedure: BRONCHOSCOPY, WITH EBUS;  Surgeon: Shelah Lamar RAMAN, MD;  Location: [facility] ENDOSCOPY;   Service: Cardiopulmonary;  Laterality: Bilateral;    REVIEW OF SYSTEMS:  Constitutional: positive for fatigue and weight loss Eyes: negative Ears, nose, mouth, throat, and face: negative Respiratory: negative Cardiovascular: negative Gastrointestinal: negative Genitourinary:negative Integument/breast: negative Hematologic/lymphatic: negative Musculoskeletal:positive for arthralgias Neurological: negative Behavioral/Psych: negative Endocrine: negative Allergic/Immunologic: negative   PHYSICAL EXAMINATION: General appearance: alert, cooperative, fatigued, and no distress Head: Normocephalic, without obvious abnormality, atraumatic Neck: no adenopathy, no JVD, supple, symmetrical, trachea midline, and thyroid  not enlarged, symmetric, no tenderness/mass/nodules Lymph nodes: Cervical, supraclavicular, and axillary nodes normal. Resp: clear to auscultation bilaterally Back: symmetric, no curvature. ROM normal. No CVA tenderness. Cardio: regular rate and rhythm, S1, S2 normal, no murmur, click, rub or gallop GI: soft, non-tender; bowel sounds normal; no masses,  no organomegaly Extremities: extremities normal, atraumatic, no cyanosis or edema Neurologic: Alert and oriented X 3, normal strength and tone. Normal symmetric reflexes. Normal coordination and gait  ECOG PERFORMANCE STATUS: 1 - Symptomatic but completely ambulatory  Pulse 92, temperature 98.3 F (36.8 C), resp. rate 17, height 6' 1 (1.854 m), weight 124 lb 12.8 oz (56.6 kg), SpO2 98%.  LABORATORY DATA: Lab Results  Component Value Date   WBC 2.9 (L) 12/16/2023   HGB 10.3 (L) 12/16/2023   HCT 30.2 (L) 12/16/2023   MCV 91.5 12/16/2023   PLT 167 12/16/2023      Chemistry      Component Value Date/Time   NA 128 (L) 11/25/2023 1144   K 4.2 11/25/2023 1144   CL 92 (L) 11/25/2023 1144   CO2 26 11/25/2023 1144   BUN 17 11/25/2023 1144   CREATININE 0.76 11/25/2023 1144   CREATININE 0.86 11/12/2011 0917      Component  Value Date/Time   CALCIUM 8.9 11/25/2023 1144   ALKPHOS 53 11/25/2023 1144   AST 18 11/25/2023 1144   ALT 16 11/25/2023 1144   BILITOT 0.4 11/25/2023 1144       RADIOGRAPHIC STUDIES: No results found.   ASSESSMENT AND PLAN: This is a very pleasant 73 years old white male diagnosed with poorly differentiated carcinoma consistent with adenocarcinoma in the right upper lobe.  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  The patient also has a suspicious left renal lesion concerning for renal cell carcinoma. He is status post a course of systemic chemotherapy initially with cisplatin  and etoposide  but the cisplatin  was discontinued secondary to renal insufficiency and the patient continued 3 more cycles of his systemic chemotherapy with carboplatin  and etoposide  concurrent with radiation.   He had evidence for disease recurrence with metastatic brain lesions in addition to right hilar lymphadenopathy treated with SRS as well as palliative radiotherapy to the right hilar area. He had a PET scan performed recently that showed evidence for disease progression in the mediastinal lymph nodes as well as right adrenal metastasis. He had bronchoscopy done recently by Dr. Shelah and the final pathology from the 2R and 4L lymph nodes showed malignant cells consistent with renal cell carcinoma. He is currently undergoing treatment with immunotherapy with ipilimumab  1 mg/KG in addition to nivolumab  360 mg IV every 3 weeks.  Status post 1 cycle. He tolerated the first cycle of his treatment well except for mild skin rash as well as few episodes of diarrhea. Assessment and Plan Assessment & Plan Metastatic clear cell renal cell carcinoma of the left kidney, on first-line immunotherapy Currently undergoing first-line treatment with ipilimumab  and nivolumab . Completed one cycle of immunotherapy. Treatment is expected to cause inflammation in various parts of the  body, including skin and gastrointestinal tract. Symptoms such as diarrhea and rash are anticipated but manageable with supportive care. - Administer second cycle of ipilimumab  and nivolumab  - Advise to contact clinic if symptoms worsen significantly  Immunotherapy-induced diarrhea Diarrhea occurring two to three times daily, associated with immunotherapy. Currently managed with Imodium, but it is not effective. Diarrhea is considered low grade at this frequency. High-dose steroids will be considered if diarrhea becomes severe. - Continue Imodium as needed - Advise to report if diarrhea frequency increases to six or more times daily - Consider high-dose steroids if diarrhea becomes severe  Immunotherapy-induced pruritic rash (hives) Pruritic rash (hives) present for four weeks, likely related to immunotherapy. Rash is localized to back, thighs, and side, with some areas scabbing over. Managed with hydrocortisone cream, Claritin, and Pepcid . Advised to keep skin moisturized to prevent worsening. - Continue hydrocortisone cream 1% - Continue Claritin and Pepcid  - Advise to keep skin moisturized and avoid dryness - Consider higher strength hydrocortisone if rash worsens  Peripheral edema - Advise to continue elevating legs to manage edema  Unintentional  weight loss Unintentional weight loss of approximately five pounds. He has a fast metabolism and is encouraged to snack between meals to prevent further weight loss. Referral to dietitian for nutritional counseling is planned. - Refer to dietitian for nutritional counseling - Encourage snacking between meals The patient was advised to call immediately if he has any concerning symptoms in the interval.  The patient voices understanding of current disease status and treatment options and is in agreement with the current care plan.  All questions were answered. The patient knows to call the clinic with any problems, questions or concerns. We can  certainly see the patient much sooner if necessary. The total time spent in the appointment was 30 minutes including review of chart and various tests results, discussions about plan of care and coordination of care plan .   Disclaimer: This note was dictated with voice recognition software. Similar sounding words can inadvertently be transcribed and may not be corrected upon review.

## 2023-12-17 ENCOUNTER — Inpatient Hospital Stay

## 2023-12-17 ENCOUNTER — Other Ambulatory Visit: Payer: Self-pay | Admitting: Physician Assistant

## 2023-12-17 ENCOUNTER — Encounter: Payer: Self-pay | Admitting: Internal Medicine

## 2023-12-17 VITALS — BP 99/56 | HR 84 | Temp 97.8°F | Resp 16

## 2023-12-17 DIAGNOSIS — I959 Hypotension, unspecified: Secondary | ICD-10-CM

## 2023-12-17 DIAGNOSIS — C642 Malignant neoplasm of left kidney, except renal pelvis: Secondary | ICD-10-CM

## 2023-12-17 DIAGNOSIS — C3411 Malignant neoplasm of upper lobe, right bronchus or lung: Secondary | ICD-10-CM

## 2023-12-17 DIAGNOSIS — Z5112 Encounter for antineoplastic immunotherapy: Secondary | ICD-10-CM | POA: Diagnosis not present

## 2023-12-17 MED ORDER — SODIUM CHLORIDE 0.9 % IV SOLN: INTRAVENOUS | Status: DC

## 2023-12-17 MED ORDER — SODIUM CHLORIDE 0.9 % IV SOLN
3.0500 mg/kg | Freq: Once | INTRAVENOUS | Status: AC
  Administered 2023-12-17: 180 mg via INTRAVENOUS
  Filled 2023-12-17: qty 10

## 2023-12-17 MED ORDER — FAMOTIDINE IN NACL 20-0.9 MG/50ML-% IV SOLN
20.0000 mg | Freq: Once | INTRAVENOUS | Status: AC
  Administered 2023-12-17: 20 mg via INTRAVENOUS
  Filled 2023-12-17: qty 50

## 2023-12-17 MED ORDER — SODIUM CHLORIDE 0.9 % IV SOLN
1.0000 mg/kg | Freq: Once | INTRAVENOUS | Status: AC
  Administered 2023-12-17: 60 mg via INTRAVENOUS
  Filled 2023-12-17: qty 12

## 2023-12-17 MED ORDER — DIPHENHYDRAMINE HCL 50 MG/ML IJ SOLN
25.0000 mg | Freq: Once | INTRAMUSCULAR | Status: AC
  Administered 2023-12-17: 25 mg via INTRAVENOUS
  Filled 2023-12-17: qty 1

## 2023-12-17 NOTE — Patient Instructions (Signed)
 [facility] - A DEPT OF [facility]  Discharge Instructions: Thank you for choosing [facility] to provide your oncology and hematology care.   If you have a lab appointment with the Cancer Center, please go directly to the Cancer Center and check in at the registration area.  Wear comfortable clothing and clothing appropriate for easy access to any Portacath or PICC line.   We strive to give you quality time with your provider. You may need to reschedule your appointment if you arrive late (15 or more minutes).  Arriving late affects you and other patients whose appointments are after yours.  Also, if you miss three or more appointments without notifying the office, you may be dismissed from the clinic at the provider's discretion.      For prescription refill requests, have your pharmacy contact our office and allow 72 hours for refills to be completed.    Today you received the following chemotherapy and/or immunotherapy agents yervoy , opdivo       To help prevent nausea and vomiting after your treatment, we encourage you to take your nausea medication as directed.  BELOW ARE SYMPTOMS THAT SHOULD BE REPORTED IMMEDIATELY: *FEVER GREATER THAN 100.4 F (38 C) OR HIGHER *CHILLS OR SWEATING *NAUSEA AND VOMITING THAT IS NOT CONTROLLED WITH YOUR NAUSEA MEDICATION *UNUSUAL SHORTNESS OF BREATH *UNUSUAL BRUISING OR BLEEDING *URINARY PROBLEMS (pain or burning when urinating, or frequent urination) *BOWEL PROBLEMS (unusual diarrhea, constipation, pain near the anus) TENDERNESS IN MOUTH AND THROAT WITH OR WITHOUT PRESENCE OF ULCERS (sore throat, sores in mouth, or a toothache) UNUSUAL RASH, SWELLING OR PAIN  UNUSUAL VAGINAL DISCHARGE OR ITCHING   Items with * indicate a potential emergency and should be followed up as soon as possible or go to the Emergency Department if any problems should occur.  Please show the CHEMOTHERAPY ALERT CARD or  IMMUNOTHERAPY ALERT CARD at check-in to the Emergency Department and triage nurse. Should you have questions after your visit or need to cancel or reschedule your appointment, please contact [facility] - A DEPT OF [facility]  [phone] and follow the prompts.  Office hours are 8:00 a.m. to 4:30 p.m. Monday - Friday. Please note that voicemails left after 4:00 p.m. may not be returned until the following business day.  We are closed weekends and major holidays. You have access to a nurse at all times for urgent questions. Please call the main number to the clinic [phone] and follow the prompts.  For any non-urgent questions, you may also contact your provider using MyChart. We now offer e-Visits for anyone 66 and older to request care online for non-urgent symptoms. For details visit [email or web address].   Also download the MyChart app! Go to the app store, search MyChart, open the app, select [facility], and log in with your MyChart username and password.

## 2023-12-17 NOTE — Progress Notes (Signed)
 Patient's Yervoy  was run over 30 minutes- per pharmacist Devan Mitchell, RPH there was overfill and can be run at rate shown on the [facility].

## 2023-12-17 NOTE — Progress Notes (Signed)
 Ipilimumab  (YERVOY ) Patient Monitoring Assessment   Is the patient experiencing any of the following general symptoms?:  [] Difficulty performing normal activities [] Feeling sluggish or cold all the time [] Unusual weight gain [] Constant or unusual headaches [] Feeling dizzy or faint [] Changes in eyesight (blurry vision, double vision, or other vision problems) [] Changes in mood or behavior (ex: decreased sex drive, irritability, or forgetfulness) [] Starting new medications (ex: steroids, other medications that lower immune response) [x] Patient is not experiencing any of the general symptoms above.    Gastrointestinal  Patient is having 1-2 bowel movements each day.  Is this different from baseline? [] Yes [x] No Are your stools watery or do they have a foul smell? [] Yes [x] No Have you seen blood in your stools? [] Yes [x] No Are your stools dark, tarry, or sticky? [] Yes [x] No Are you having pain or tenderness in your belly? [x] Yes [] No  Skin Does your skin itch? [x] Yes [] No Do you have a rash? [x] Yes [] No Has your skin blistered and/or peeled? [] Yes [x] No Do you have sores in your mouth? [] Yes [x] No  Hepatic Has your urine been dark or tea colored? [] Yes [x] No Have you noticed that your skin or the whites of your eyes are turning yellow? [] Yes [x] No Are you bleeding or bruising more easily than normal? [] Yes [x] No Are you nauseous and/or vomiting? [] Yes [x] No Do you have pain on the right side of your stomach? [] Yes [x] No  Neurologic  Are you having unusual weakness of legs, arms, or face? [] Yes [x] No Are you having numbness or tingling in your hands or feet? [] Yes [x] No  Skin irritations treated with benadryl , pepcid , and claritin.  Per patient, L lower abdominal pain - MD aware.  Wiley, Jacquelyn N

## 2023-12-24 ENCOUNTER — Other Ambulatory Visit: Payer: Self-pay | Admitting: Radiation Therapy

## 2023-12-24 ENCOUNTER — Telehealth: Payer: Self-pay | Admitting: Radiation Therapy

## 2023-12-24 DIAGNOSIS — C7931 Secondary malignant neoplasm of brain: Secondary | ICD-10-CM

## 2023-12-24 NOTE — Telephone Encounter (Signed)
 I called to talk to Ian Hart about the recommendation to also scan his total spine when doing the follow-up brain imaging in October. Due to the length of the scans, the cervical spine scan has been added to the 10/28 brain MRI appt and the Thoracic and Lumbar spine scans will be done on a separate day, 10/30.   Ian Hart did not answer the call, so a detailed message was left including these details and a request to call back if he has questions or a conflict.   Devere Perch R.T(R)(T) Radiation Special Procedures Lead

## 2023-12-29 ENCOUNTER — Other Ambulatory Visit (HOSPITAL_COMMUNITY)

## 2024-01-01 NOTE — Progress Notes (Addendum)
 [facility] OFFICE PROGRESS NOTE  [name], NP 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  PRIOR THERAPY:  Systemic chemotherapy/radiation with cisplatin  80 mg per metered squared on day 1 and etoposide  100 mg per metered squared on days 1, 2, and 3 IV every 3 weeks.  Status post 4 cycles.  Starting from cycle #3, cisplatin  was changed to carboplatin  for an AUC of 5 due to renal insufficiency. First dose on 04/20/20. Onpro was added to the treatment plan starting from cycle #3 due to pancytopenia.  Radiation to the right lung, completed on 06/01/20  PCI under the care of Dr. Dewey. Completed on 08/31/20  Robotic nephrectomy on 01/28/21 under the care of Dr. Tommi at [facility]  [facility] under the care of Dr. Dewey for a SRS to the metastatic brain lesions, completed on 01/30/23  Radiation to L1 and L5 under the care of Dr. Dewey. Last dose on 12/08/23    CURRENT THERAPY: First-line treatment with immunotherapy with ipilimumab  1 Mg/KG in addition to nivolumab  360 Mg IV every 3 weeks for 4 cycles followed by maintenance treatment with nivolumab  480 Mg IV every 4 weeks.  First dose of treatment November 25, 2023.  Status post 2 cycles.  Yervoy  will be discontinued from cycle #3 due to diarrhea possibly immunotherapy mediated.  INTERVAL HISTORY: Ian VIOLETT 73 y.o. male returns to the clinic today for a follow up visit. The patient was last seen in the clinic by Dr. Sherrod on 12/16/23. The patient is currently on immunotherapy for his metastatic clear cell renal cell carcinoma of the left kidney. He is status post 2 cycles of immunotherapy.  The patient has been experiencing persistent  diarrhea.  He reports he is having 7-8 episodes of diarrhea per day.  He was not having diarrhea prior to his immunotherapy.  He used a whole box of Imodium without any improvement.  He denies any blood in the stool.  He denies any abdominal pain at this time but reports he intermittently may have lower left quadrant discomfort that is mild and may come and go.  He denies any fevers.  He denies any recent antibiotic use.  He does not have a blood pressure cuff at home.  He is not on any antihypertensives.  He denies any history of arrhythmia.  Did not eat any breakfast this morning.  He reports he was weighing 130 pounds prior to treatment.  He is down to 108 pounds.  He is scheduled to see a member the nutritionist.  He is drinking small sips of Gatorade.  He is currently taking Zofran  8 mg and Compazine  10 mg for nausea, finding some relief with the latter.  Denies any cough, chest pain, or hemoptysis.  He may get shortness of breath that comes and goes.  He denies any lightheadedness or dizziness today despite the low blood pressure readings.  The patient has a brain MRI and thoracic and lumbar spine scheduled for next month.  The patient is here today for evaluation prior to starting cycle # 3   MEDICAL HISTORY: Past Medical History:  Diagnosis Date   History of chicken pox    History of shingles 04/2010  Lung cancer (HCC) 02/2020    ALLERGIES:  is allergic to acyclovir and related.  MEDICATIONS:  Current Outpatient Medications  Medication Sig Dispense Refill   methylPREDNISolone  (MEDROL  DOSEPAK) 4 MG TBPK tablet Use as instructed 21 tablet 0   alendronate  (FOSAMAX ) 70 MG tablet Take 1 tablet (70 mg total) by mouth every 7 (seven) days. Take with a full glass of water on an empty stomach. 12 tablet 4   Calcium Carbonate-Vit D-Min (CALTRATE 600+D PLUS MINERALS) 600-800 MG-UNIT TABS Take 1 tablet by mouth in the morning and at bedtime.     lidocaine -prilocaine  (EMLA ) cream Apply to  affected area once 30 g 3   LORazepam  (ATIVAN ) 0.5 MG tablet 1 tab po 30 minutes prior to radiation or MRI scans 10 tablet 0   Multiple Vitamin (MULTIVITAMIN) tablet Take 1 tablet by mouth daily.     ondansetron  (ZOFRAN ) 8 MG tablet Take 1 tablet (8 mg total) by mouth every 8 (eight) hours as needed for nausea or vomiting. 30 tablet 1   prochlorperazine  (COMPAZINE ) 10 MG tablet Take 1 tablet (10 mg total) by mouth every 6 (six) hours as needed for nausea or vomiting. 30 tablet 1   tamsulosin  (FLOMAX ) 0.4 MG CAPS capsule Take 1 capsule (0.4 mg total) by mouth daily. 90 capsule 1   Zoster Vaccine Adjuvanted (SHINGRIX ) injection 0.5ml IM now and again in 2-6 months 0.5 mL 1   No current facility-administered medications for this visit.    SURGICAL HISTORY:  Past Surgical History:  Procedure Laterality Date   BRONCHIAL BIOPSY  03/22/2020   Procedure: BRONCHIAL BIOPSIES;  Surgeon: Brenna Adine CROME, DO;  Location: [facility];  Service: Pulmonary;;   BRONCHIAL BRUSHINGS  03/22/2020   Procedure: BRONCHIAL BRUSHINGS;  Surgeon: Brenna Adine CROME, DO;  Location: [facility]OS[facility];  Service: Pulmonary;;   BRONCHIAL NEEDLE ASPIRATION BIOPSY  03/22/2020   Procedure: BRONCHIAL NEEDLE ASPIRATION BIOPSIES;  Surgeon: Brenna Adine CROME, DO;  Location: [facility]OS[facility];  Service: Pulmonary;;   BRONCHIAL WASHINGS  03/22/2020   Procedure: BRONCHIAL WASHINGS;  Surgeon: Brenna Adine CROME, DO;  Location: [facility];  Service: Pulmonary;;   FIDUCIAL MARKER PLACEMENT  03/22/2020   Procedure: FIDUCIAL MARKER PLACEMENT;  Surgeon: Brenna Adine CROME, DO;  Location: [facility];  Service: Pulmonary;;   IR IMAGING GUIDED PORT INSERTION  11/05/2023   IR LUMBAR PUNCTURE  11/05/2023   ROBOTIC ASSITED PARTIAL NEPHRECTOMY Left 2022   VIDEO BRONCHOSCOPY WITH ENDOBRONCHIAL NAVIGATION N/A 03/22/2020   Procedure: VIDEO BRONCHOSCOPY WITH ENDOBRONCHIAL NAVIGATION;  Surgeon: Brenna Adine CROME, DO;  Location: [facility];  Service:  Pulmonary;  Laterality: N/A;   VIDEO BRONCHOSCOPY WITH ENDOBRONCHIAL ULTRASOUND N/A 03/22/2020   Procedure: VIDEO BRONCHOSCOPY WITH ENDOBRONCHIAL ULTRASOUND;  Surgeon: Brenna Adine CROME, DO;  Location: [facility];  Service: Pulmonary;  Laterality: N/A;   VIDEO BRONCHOSCOPY WITH ENDOBRONCHIAL ULTRASOUND Bilateral 11/10/2023   Procedure: BRONCHOSCOPY, WITH EBUS;  Surgeon: Shelah Lamar RAMAN, MD;  Location[facility] ENDOSCOPY;  Service: Cardiopulmonary;  Laterality: Bilateral;    REVIEW OF SYSTEMS:   Review of Systems  Constitutional: Positive for fatigue, weight loss, and appetite change.  Negative for chills and fever.  HENT: Negative for mouth sores, nosebleeds, sore throat and trouble swallowing.   Eyes: Negative for eye problems and icterus.  Respiratory: Intermittent shortness of breath. Negative for cough, hemoptysis, shortness of breath and wheezing.   Cardiovascular: Negative for chest pain and leg swelling.  Gastrointestinal: Intermittent nausea/vomiting. Positive for diarrhea. No abdominal pain at this time. Negative for  constipation.  Genitourinary: Negative for bladder incontinence, difficulty urinating, dysuria, frequency and hematuria.   Musculoskeletal: Negative for back pain, gait problem, neck pain and neck stiffness.  Skin: Negative for itching and rash.  Neurological: Negative for dizziness, extremity weakness, gait problem, headaches, light-headedness and seizures.  Hematological: Negative for adenopathy. Does not bruise/bleed easily.  Psychiatric/Behavioral: Negative for confusion, depression and sleep disturbance. The patient is not nervous/anxious.     PHYSICAL EXAMINATION:  Blood pressure (!) 82/64, pulse (!) 123, temperature 98.1 F (36.7 C), temperature source Temporal, resp. rate 20, weight 108 lb 14.4 oz (49.4 kg), SpO2 100%.  ECOG PERFORMANCE STATUS: 2  Physical Exam  Constitutional: Oriented to person, place, and time and cachetic appearing male, and in no distress.   HENT:  Head: Normocephalic and atraumatic.  Mouth/Throat: Oropharynx is clear and moist. No oropharyngeal exudate.  Eyes: Conjunctivae are normal. Right eye exhibits no discharge. Left eye exhibits no discharge. No scleral icterus.  Neck: Normal range of motion. Neck supple.  Cardiovascular: Tachycardic, regular rhythm, normal heart sounds and intact distal pulses.   Pulmonary/Chest: Effort normal and breath sounds normal. No respiratory distress. No wheezes. No rales.  Abdominal: Soft. Bowel sounds are normal. Exhibits no distension and no mass. There is no tenderness.  Musculoskeletal: Normal range of motion. Exhibits no edema.  Lymphadenopathy:    No cervical adenopathy.  Neurological: Alert and oriented to person, place, and time. Exhibits muscle wasting. Gait normal. Coordination normal. Uses a cane for ambulation. Skin: Skin is warm and dry. No rash noted. Not diaphoretic. No erythema. No pallor.  Psychiatric: Mood, memory and judgment normal.  Vitals reviewed.  LABORATORY DATA: Lab Results  Component Value Date   WBC 6.6 01/07/2024   HGB 11.1 (L) 01/07/2024   HCT 32.5 (L) 01/07/2024   MCV 90.8 01/07/2024   PLT 300 01/07/2024      Chemistry      Component Value Date/Time   NA 135 12/16/2023 1040   K 4.1 12/16/2023 1040   CL 102 12/16/2023 1040   CO2 27 12/16/2023 1040   BUN 18 12/16/2023 1040   CREATININE 0.91 12/16/2023 1040   CREATININE 0.86 11/12/2011 0917      Component Value Date/Time   CALCIUM 8.5 (L) 12/16/2023 1040   ALKPHOS 88 12/16/2023 1040   AST 22 12/16/2023 1040   ALT 25 12/16/2023 1040   BILITOT 0.4 12/16/2023 1040       RADIOGRAPHIC STUDIES:  No results found.   ASSESSMENT/PLAN:  This is a very pleasant 73 year old Caucasian male diagnosed with poorly differentiated carcinoma consistent with adenocarcinoma in the right upper lobe.  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  The patient also  has a suspicious left renal lesion concerning for renal cell carcinoma. He is status post a course of systemic chemotherapy initially with cisplatin  and etoposide  but the cisplatin  was discontinued secondary to renal insufficiency and the patient continued 3 more cycles of his systemic chemotherapy with carboplatin  and etoposide  concurrent with radiation.   He completed PCI under the care of Dr. Dewey 08/31/20.  He had evidence for disease recurrence with metastatic brain lesions in addition to right hilar lymphadenopathy treated with SRS as well as palliative radiotherapy to the right hilar area. 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  He had a PET scan performed recently that showed evidence for disease progression in the mediastinal lymph nodes as well as right adrenal metastasis. He also underwent radiation to the lumbar spine under the care of Dr. Dewey which was completed on 12/08/23.  He had bronchoscopy done recently by Dr. Shelah and the final pathology from the 2R and 4L lymph nodes showed malignant cells consistent with renal cell carcinoma. He is currently undergoing treatment with immunotherapy with ipilimumab  1 mg/KG in addition to nivolumab  360 mg IV every 3 weeks.  Status post 2 cycles.  The patient's pulse was checked with EKG and his pulse is at 117 and shows sinus tachycardia.  His pressure is low at 82/64.  The patient did not eat today.  He is not on any antihypertensives.  He has been having persistent diarrhea.  Due to the patient's symptoms the patient was seen with Dr. Sherrod today.  We will discontinue Yervoy  from his care plan.  I have deleted day 1 cycle 3-day 1 cycle 4 from the care plan we will also delay treatment by 1 week and administer 1 L of IV fluids over 2 hours today.  We will recheck his blood pressure prior to  leaving.  I did encourage him and his wife to purchase a blood pressure cuff and monitor this closely at home.  He was advised to eat small frequent meals.  He is expected to see a member the nutritionist team at an upcoming appointment.  He was also encouraged to use electrolyte drinks such as liquid IV.  I did give him a sample of liquid IV today.  We will give him a Medrol  Dosepak should this be possibly immunotherapy mediated diarrhea.  He was instructed to start this as soon as possible.  He was instructed to take steroids first thing in the morning.  If he has any worsening symptoms or does not improve as anticipated, he was advised to call us  to be seen sooner.  His diarrhea is is likely related to his immunotherapy but we will check a GI pathogen panel and C. difficile testing to ensure no infectious cause.  He has not been on any antibiotics recently.  Will see him back for labs and a follow-up next week before undergoing cycle #3.  The patient was advised to call immediately if she has any concerning symptoms in the interval. The patient voices understanding of current disease status and treatment options and is in agreement with the current care plan. All questions were answered. The patient knows to call the clinic with any problems, questions or concerns. We can certainly see the patient much sooner if necessary    Orders Placed This Encounter  Procedures   C difficile quick screen w PCR reflex    Standing Status:   Future    Expected Date:   01/07/2024    Expiration Date:   01/06/2025   GI pathogen panel by PCR, stool    Standing Status:   Future    Expected Date:   01/07/2024    Expiration Date:   01/06/2025   EKG 12-Lead      Jaeshawn Silvio L Emmerie Battaglia, PA-C 01/07/24  ADDENDUM: Hematology/Oncology Attending: I had a face-to-face encounter with the patient today.  I reviewed his record, lab, and recommended his care plan.  This is a very pleasant 73 years old white male  with recently diagnosed metastatic clear-cell renal cell carcinoma diagnosed in July 2025.  The patient also has a history of  small cell lung cancer treated in 2022 and has been in observation since that time.  With the metastatic renal cell carcinoma he started treatment with immunotherapy with ipilimumab  and nivolumab  status post 2 cycles.  He has been complaining of increasing fatigue and weakness as well as persistent diarrhea that he reports is up to 7-8 times a day and started after his immunotherapy.  He also has lack of appetite and poor hydration.  His blood pressure was low today.  I had a lengthy discussion with the patient and his wife today about his current condition. I recommended for him to receive IV hydration with normal saline today.  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 We will also start the patient on a Medrol  Dosepak during this week. Will check his stool for any infectious etiology of his diarrhea. The patient was also encouraged to increase his oral intake with oral liquid IV. We will see him back for follow-up visit in 1 week for evaluation before starting cycle #3. He was advised to call immediately if he has any other concerning symptoms in the interval. The total time spent in the appointment was 30 minutes including review of chart and various tests results, discussions about plan of care and coordination of care plan . Disclaimer: This note was dictated with voice recognition software. Similar sounding words can inadvertently be transcribed and may be missed upon review. Sherrod MARLA Sherrod, MD

## 2024-01-04 ENCOUNTER — Ambulatory Visit: Admitting: Radiation Oncology

## 2024-01-04 ENCOUNTER — Encounter

## 2024-01-06 ENCOUNTER — Ambulatory Visit: Admitting: Family

## 2024-01-07 ENCOUNTER — Inpatient Hospital Stay

## 2024-01-07 ENCOUNTER — Other Ambulatory Visit: Payer: Self-pay

## 2024-01-07 ENCOUNTER — Inpatient Hospital Stay (HOSPITAL_BASED_OUTPATIENT_CLINIC_OR_DEPARTMENT_OTHER): Admitting: Physician Assistant

## 2024-01-07 ENCOUNTER — Inpatient Hospital Stay: Admitting: Nutrition

## 2024-01-07 VITALS — BP 82/64 | HR 123 | Temp 98.1°F | Resp 20 | Wt 108.9 lb

## 2024-01-07 DIAGNOSIS — E860 Dehydration: Secondary | ICD-10-CM | POA: Diagnosis not present

## 2024-01-07 DIAGNOSIS — R197 Diarrhea, unspecified: Secondary | ICD-10-CM | POA: Diagnosis not present

## 2024-01-07 DIAGNOSIS — C642 Malignant neoplasm of left kidney, except renal pelvis: Secondary | ICD-10-CM

## 2024-01-07 DIAGNOSIS — R000 Tachycardia, unspecified: Secondary | ICD-10-CM

## 2024-01-07 DIAGNOSIS — Z5112 Encounter for antineoplastic immunotherapy: Secondary | ICD-10-CM | POA: Diagnosis not present

## 2024-01-07 LAB — CBC WITH DIFFERENTIAL (CANCER CENTER ONLY)
Abs Immature Granulocytes: 0.02 K/uL (ref 0.00–0.07)
Basophils Absolute: 0 K/uL (ref 0.0–0.1)
Basophils Relative: 1 %
Eosinophils Absolute: 0.1 K/uL (ref 0.0–0.5)
Eosinophils Relative: 1 %
HCT: 32.5 % — ABNORMAL LOW (ref 39.0–52.0)
Hemoglobin: 11.1 g/dL — ABNORMAL LOW (ref 13.0–17.0)
Immature Granulocytes: 0 %
Lymphocytes Relative: 8 %
Lymphs Abs: 0.5 K/uL — ABNORMAL LOW (ref 0.7–4.0)
MCH: 31 pg (ref 26.0–34.0)
MCHC: 34.2 g/dL (ref 30.0–36.0)
MCV: 90.8 fL (ref 80.0–100.0)
Monocytes Absolute: 1.1 K/uL — ABNORMAL HIGH (ref 0.1–1.0)
Monocytes Relative: 17 %
Neutro Abs: 4.8 K/uL (ref 1.7–7.7)
Neutrophils Relative %: 73 %
Platelet Count: 300 K/uL (ref 150–400)
RBC: 3.58 MIL/uL — ABNORMAL LOW (ref 4.22–5.81)
RDW: 13.3 % (ref 11.5–15.5)
WBC Count: 6.6 K/uL (ref 4.0–10.5)
nRBC: 0 % (ref 0.0–0.2)

## 2024-01-07 LAB — CMP (CANCER CENTER ONLY)
ALT: 22 U/L (ref 0–44)
AST: 18 U/L (ref 15–41)
Albumin: 3.6 g/dL (ref 3.5–5.0)
Alkaline Phosphatase: 104 U/L (ref 38–126)
Anion gap: 8 (ref 5–15)
BUN: 21 mg/dL (ref 8–23)
CO2: 26 mmol/L (ref 22–32)
Calcium: 9.1 mg/dL (ref 8.9–10.3)
Chloride: 99 mmol/L (ref 98–111)
Creatinine: 1.29 mg/dL — ABNORMAL HIGH (ref 0.61–1.24)
GFR, Estimated: 59 mL/min — ABNORMAL LOW (ref >60)
Glucose, Bld: 109 mg/dL — ABNORMAL HIGH (ref 70–99)
Potassium: 4.1 mmol/L (ref 3.5–5.1)
Sodium: 133 mmol/L — ABNORMAL LOW (ref 135–145)
Total Bilirubin: 0.5 mg/dL (ref 0.0–1.2)
Total Protein: 6.8 g/dL (ref 6.5–8.1)

## 2024-01-07 MED ORDER — METHYLPREDNISOLONE 4 MG PO TBPK: ORAL_TABLET | ORAL | 0 refills | Status: DC

## 2024-01-07 MED ORDER — SODIUM CHLORIDE 0.9 % IV SOLN: Freq: Once | INTRAVENOUS | Status: AC

## 2024-01-07 NOTE — Patient Instructions (Signed)

## 2024-01-07 NOTE — Patient Instructions (Signed)

## 2024-01-07 NOTE — Progress Notes (Signed)
 73 year old male diagnosed with metastatic clear-cell renal carcinoma.  Patient is followed by Dr. Sherrod.  He has been receiving Yervoy  and nivolumab .  Past medical history includes small cell lung cancer, chickenpox, shingles.  Medications include vitamin D , Ativan , multivitamin, Zofran , and Compazine .  Labs include glucose 108 on September 3.  Height: 6 feet 1 inch. Weight: 108 pounds 14.4 ounces September 25 Usual body weight: 130 pounds. BMI: 14.37.  Patient has been experiencing multiple episodes of diarrhea.  He tried Imodium which he said was not helpful.  Noted MD believes diarrhea is a result of immunotherapy.  He is discontinuing Yervoy .  Patient has a very poor appetite.  Reports nausea and vomiting if he tries to eat too much at 1 time.  Reports his wife is frustrated with him because he is not able to eat a lot.  He would like for her to talk to me.  Patient has experienced 17% weight loss over 1 month which is clinically significant.  Nutrition diagnosis: Unintended weight loss related to cancer and associated treatments as evidenced by 22 pound weight loss over 1 month.  Intervention: Educated patient on importance of smaller amounts of food throughout the day.  Reviewed foods with higher calories and higher protein.  Suggested patient set a timer to remind him to eat.  Also encouraged him to leave snacks out on a counter where he can see them. Additional tips given to improve poor appetite. Reviewed low fiber diet for diarrhea Provided samples of clear nutrition supplements as well as Benefiber.  Encouraged patient to consume Ensure Plus or equivalent if well-tolerated. Nutrition facts sheets were given.  Provided recipes.  Patient has contact information. Encouraged patient to give wife my contact information.  Monitoring, evaluation, goals: Patient will tolerate adequate calories and protein to minimize weight loss.  Next visit: Thursday, November 6 during  infusion.  **Disclaimer: This note was dictated with voice recognition software. Similar sounding words can inadvertently be transcribed and this note may contain transcription errors which may not have been corrected upon publication of note.**

## 2024-01-07 NOTE — Progress Notes (Signed)
 Patient presents to infusion today via wheelchair. No treatment today per Cassie, PA; pt to receive 1 L IVF over 2 hours.  Patient was given home stool kit for Cdiff and GI pathogen panel collection. Patient instructed on how to provide stool sample and return it to lab, as well as a printed copy was given to patient from kit. Patient did not have any questions in regards to collection and returning sample.  VS were stable at discharge, see flowsheet. Cassie, PA made aware. Patient instructed to monitor BP at home and given instructions on when to go to ED. Patient stated that he felt a whole lot better from this morning. Patient was discharged to lobby ambulatory with cane with no further questions.

## 2024-01-08 ENCOUNTER — Ambulatory Visit: Admitting: Nutrition

## 2024-01-08 NOTE — Progress Notes (Signed)
 Contacted wife on telephone for follow up per patient and wife request.  Reviewed nutrition strategies with her that I reviewed with patient yesterday. Encouraged her to offer small amounts of high calorie, high protein foods throughout the day. Encouraged ONS. Encouraged her to continue to attend support groups as she has enjoyed them in the past. Provided empathetic listening.   Encouraged her to reach out with further nutrition questions/concerns. She is appreciative of telephone call.

## 2024-01-09 NOTE — Progress Notes (Deleted)
 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  PRIOR THERAPY: Systemic chemotherapy/radiation with cisplatin  80 mg per metered squared on day 1 and etoposide  100 mg per metered squared on days 1, 2, and 3 IV every 3 weeks.  Status post 4 cycles.  Starting from cycle #3, cisplatin  was changed to carboplatin  for an AUC of 5 due to renal insufficiency. First dose on 04/20/20. Onpro was added to the treatment plan starting from cycle #3 due to pancytopenia.  Radiation to the right lung, completed on 06/01/20  PCI under the care of Dr. Dewey. Completed on 08/31/20  Robotic nephrectomy on 01/28/21 under the care of Dr. Tommi at [facility]  [facility] under the care of Dr. Dewey for a SRS to the metastatic brain lesions, completed on 01/30/23  Radiation to L1 and L5 under the care of Dr. Dewey. Last dose on 12/08/23  CURRENT THERAPY: First-line treatment with immunotherapy with ipilimumab  1 Mg/KG in addition to nivolumab  360 Mg IV every 3 weeks for 4 cycles followed by maintenance treatment with nivolumab  480 Mg IV every 4 weeks.  First dose of treatment November 25, 2023.  Status post 3 cycles.  Yervoy  will be discontinued from cycle #3 due to diarrhea possibly immunotherapy mediated.   INTERVAL HISTORY: Ian Hart 73 y.o. male returns to the clinic today for a follow up visit. The patient was last seen in the clinic by myself and Dr. Sherrod on 12/16/23. The patient is currently on immunotherapy for his metastatic clear cell renal cell carcinoma of the left kidney. He is status post 2 cycles of immunotherapy.  The patient had been experiencing  persistent diarrhea with 7-8 episodes of diarrhea per day.  He was not having diarrhea prior to his immunotherapy.  He used a whole box of Imodium without any improvement.  Therefore, he was given a medrol  Dosepak for possible immunotherapy mediated diarrhea. He also received IVF.   His diarrhea has ***. His BP was also low last week and he was instructed to purchase a BP cuff and monitor this closely at home. His BP has *** at this time.   He also saw a member of the nutritionist team.   He denies any abdominal pain at this time. He denies any fevers.  He denies any recent antibiotic use.  He does not have a blood pressure cuff at home.  He is not on any antihypertensives.  He denies any history of arrhythmia.  ***Did not eat any breakfast this morning.   He reports he was weighing 130 pounds prior to treatment.  He is down to *** pounds.  H   He is currently taking Zofran  8 mg and Compazine  10 mg for nausea, finding some relief with the latter.   Denies any cough, chest pain, or hemoptysis.  He may get shortness of breath that comes and goes.  He denies any lightheadedness or dizziness today despite the low blood pressure readings.   The patient has a brain MRI and thoracic and lumbar spine scheduled for next month.  The patient is here today for evaluation prior to starting cycle # 3. Yervoy  was discontinued from the  treatment plan.   MEDICAL HISTORY: Past Medical History:  Diagnosis Date   History of chicken pox    History of shingles 04/2010   Lung cancer (HCC) 02/2020    ALLERGIES:  is allergic to acyclovir and related.  MEDICATIONS:  Current Outpatient Medications  Medication Sig Dispense Refill   alendronate  (FOSAMAX ) 70 MG tablet Take 1 tablet (70 mg total) by mouth every 7 (seven) days. Take with a full glass of water on an empty stomach. 12 tablet 4   Calcium Carbonate-Vit D-Min (CALTRATE 600+D PLUS MINERALS) 600-800 MG-UNIT TABS Take 1 tablet by mouth in the morning and at  bedtime.     lidocaine -prilocaine  (EMLA ) cream Apply to affected area once 30 g 3   LORazepam  (ATIVAN ) 0.5 MG tablet 1 tab po 30 minutes prior to radiation or MRI scans 10 tablet 0   methylPREDNISolone  (MEDROL  DOSEPAK) 4 MG TBPK tablet Use as instructed 21 tablet 0   Multiple Vitamin (MULTIVITAMIN) tablet Take 1 tablet by mouth daily.     ondansetron  (ZOFRAN ) 8 MG tablet Take 1 tablet (8 mg total) by mouth every 8 (eight) hours as needed for nausea or vomiting. 30 tablet 1   prochlorperazine  (COMPAZINE ) 10 MG tablet Take 1 tablet (10 mg total) by mouth every 6 (six) hours as needed for nausea or vomiting. 30 tablet 1   tamsulosin  (FLOMAX ) 0.4 MG CAPS capsule Take 1 capsule (0.4 mg total) by mouth daily. 90 capsule 1   Zoster Vaccine Adjuvanted (SHINGRIX ) injection 0.5ml IM now and again in 2-6 months 0.5 mL 1   No current facility-administered medications for this visit.    SURGICAL HISTORY:  Past Surgical History:  Procedure Laterality Date   BRONCHIAL BIOPSY  03/22/2020   Procedure: BRONCHIAL BIOPSIES;  Surgeon: Brenna Adine CROME, DO;  Location: [facility];  Service: Pulmonary;;   BRONCHIAL BRUSHINGS  03/22/2020   Procedure: BRONCHIAL BRUSHINGS;  Surgeon: Brenna Adine CROME, DO;  Location: [facility];  Service: Pulmonary;;   BRONCHIAL NEEDLE ASPIRATION BIOPSY  03/22/2020   Procedure: BRONCHIAL NEEDLE ASPIRATION BIOPSIES;  Surgeon: Brenna Adine CROME, DO;  Location: [facility];  Service: Pulmonary;;   BRONCHIAL WASHINGS  03/22/2020   Procedure: BRONCHIAL WASHINGS;  Surgeon: Brenna Adine CROME, DO;  Location: [facility];  Service: Pulmonary;;   FIDUCIAL MARKER PLACEMENT  03/22/2020   Procedure: FIDUCIAL MARKER PLACEMENT;  Surgeon: Brenna Adine CROME, DO;  Location: [facility];  Service: Pulmonary;;   IR IMAGING GUIDED PORT INSERTION  11/05/2023   IR LUMBAR PUNCTURE  11/05/2023   ROBOTIC ASSITED PARTIAL NEPHRECTOMY Left 2022   VIDEO BRONCHOSCOPY WITH ENDOBRONCHIAL NAVIGATION N/A  03/22/2020   Procedure: VIDEO BRONCHOSCOPY WITH ENDOBRONCHIAL NAVIGATION;  Surgeon: Brenna Adine CROME, DO;  Location: [facility];  Service: Pulmonary;  Laterality: N/A;   VIDEO BRONCHOSCOPY WITH ENDOBRONCHIAL ULTRASOUND N/A 03/22/2020   Procedure: VIDEO BRONCHOSCOPY WITH ENDOBRONCHIAL ULTRASOUND;  Surgeon: Brenna Adine CROME, DO;  Location: [facility];  Service: Pulmonary;  Laterality: N/A;   VIDEO BRONCHOSCOPY WITH ENDOBRONCHIAL ULTRASOUND Bilateral 11/10/2023   Procedure: BRONCHOSCOPY, WITH EBUS;  Surgeon: Shelah Lamar RAMAN, MD;  Location: [facility] ENDOSCOPY;  Service: Cardiopulmonary;  Laterality: Bilateral;    REVIEW OF SYSTEMS:   Review of Systems  Constitutional: Negative for appetite change, chills, fatigue, fever and unexpected weight change.  HENT:   Negative for mouth sores, nosebleeds, sore throat and trouble swallowing.   Eyes: Negative for eye problems and icterus.  Respiratory: Negative for cough, hemoptysis, shortness of breath and wheezing.   Cardiovascular:  Negative for chest pain and leg swelling.  Gastrointestinal: Negative for abdominal pain, constipation, diarrhea, nausea and vomiting.  Genitourinary: Negative for bladder incontinence, difficulty urinating, dysuria, frequency and hematuria.   Musculoskeletal: Negative for back pain, gait problem, neck pain and neck stiffness.  Skin: Negative for itching and rash.  Neurological: Negative for dizziness, extremity weakness, gait problem, headaches, light-headedness and seizures.  Hematological: Negative for adenopathy. Does not bruise/bleed easily.  Psychiatric/Behavioral: Negative for confusion, depression and sleep disturbance. The patient is not nervous/anxious.     PHYSICAL EXAMINATION:  There were no vitals taken for this visit.  ECOG PERFORMANCE STATUS: {CHL ONC ECOG [id number]  Physical Exam  Constitutional: Oriented to person, place, and time and well-developed, well-nourished, and in no distress. No distress.   HENT:  Head: Normocephalic and atraumatic.  Mouth/Throat: Oropharynx is clear and moist. No oropharyngeal exudate.  Eyes: Conjunctivae are normal. Right eye exhibits no discharge. Left eye exhibits no discharge. No scleral icterus.  Neck: Normal range of motion. Neck supple.  Cardiovascular: Normal rate, regular rhythm, normal heart sounds and intact distal pulses.   Pulmonary/Chest: Effort normal and breath sounds normal. No respiratory distress. No wheezes. No rales.  Abdominal: Soft. Bowel sounds are normal. Exhibits no distension and no mass. There is no tenderness.  Musculoskeletal: Normal range of motion. Exhibits no edema.  Lymphadenopathy:    No cervical adenopathy.  Neurological: Alert and oriented to person, place, and time. Exhibits normal muscle tone. Gait normal. Coordination normal.  Skin: Skin is warm and dry. No rash noted. Not diaphoretic. No erythema. No pallor.  Psychiatric: Mood, memory and judgment normal.  Vitals reviewed.  LABORATORY DATA: Lab Results  Component Value Date   WBC 6.6 01/07/2024   HGB 11.1 (L) 01/07/2024   HCT 32.5 (L) 01/07/2024   MCV 90.8 01/07/2024   PLT 300 01/07/2024      Chemistry      Component Value Date/Time   NA 133 (L) 01/07/2024 0951   K 4.1 01/07/2024 0951   CL 99 01/07/2024 0951   CO2 26 01/07/2024 0951   BUN 21 01/07/2024 0951   CREATININE 1.29 (H) 01/07/2024 0951   CREATININE 0.86 11/12/2011 0917      Component Value Date/Time   CALCIUM 9.1 01/07/2024 0951   ALKPHOS 104 01/07/2024 0951   AST 18 01/07/2024 0951   ALT 22 01/07/2024 0951   BILITOT 0.5 01/07/2024 0951       RADIOGRAPHIC STUDIES:  No results found.   ASSESSMENT/PLAN:  This is a very pleasant 73 year old Caucasian male diagnosed with poorly differentiated carcinoma consistent with adenocarcinoma in the right upper lobe.  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  The patient also has a suspicious  left renal lesion concerning for renal cell carcinoma. He is status post a course of systemic chemotherapy initially with cisplatin  and etoposide  but the cisplatin  was discontinued secondary to renal insufficiency and the patient continued 3 more cycles of his systemic chemotherapy with carboplatin  and etoposide  concurrent with radiation.   He completed PCI under the care of Dr. Dewey 08/31/20.  He had evidence for disease recurrence with metastatic brain lesions in addition to right hilar lymphadenopathy treated with SRS as well as palliative radiotherapy to the right hilar area. For the left renal mass, he underwent partial left nephrectomy at 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  He had a PET scan performed recently that showed evidence for disease progression in the mediastinal lymph nodes as well as right adrenal metastasis. He also underwent radiation to the lumbar spine under the care of Dr. Dewey which was completed on 12/08/23.  He had bronchoscopy done recently by Dr. Shelah and the final pathology from the 2R and 4L lymph nodes showed malignant cells consistent with renal cell carcinoma. He is currently undergoing treatment with immunotherapy with ipilimumab  1 mg/KG in addition to nivolumab  360 mg IV every 3 weeks.  Status post 2 cycles.  His daurrhea has *** Yervoy  will be discontinued starting from cycle #3.  His creatinine is ***. His weight is ***. His pulse is ***. His BP is ***.    The patient was seen with Dr. Sherrod. Labs were reviewed. Recommend he *** with cycle #3 today as scheduled.   We will see him back for labs and follow up in 4 weeks before undergoing cycle #4.   ***GI pathogen and cdiff  The patient was advised to call immediately if he has any concerning symptoms in the interval. The patient voices understanding of current disease status and treatment options and is in  agreement with the current care plan. All questions were answered. The patient knows to call the clinic with any problems, questions or concerns. We can certainly see the patient much sooner if necessary       No orders of the defined types were placed in this encounter.    I spent {CHL ONC TIME VISIT - DTPQU:[id number]} counseling the patient face to face. The total time spent in the appointment was {CHL ONC TIME VISIT - DTPQU:[id number]}.  Yehonatan Grandison L Rosangela Fehrenbach, PA-C 01/09/24

## 2024-01-11 ENCOUNTER — Ambulatory Visit
Admission: RE | Admit: 2024-01-11 | Discharge: 2024-01-11 | Disposition: A | Discharge status: Home / Self Care | Source: Ambulatory Visit | Attending: Internal Medicine | Admitting: Internal Medicine

## 2024-01-11 DIAGNOSIS — C3411 Malignant neoplasm of upper lobe, right bronchus or lung: Secondary | ICD-10-CM | POA: Insufficient documentation

## 2024-01-11 NOTE — Progress Notes (Addendum)
  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    Intent: Palliative   The patient was not available for call today.  He will continue follow up with Dr. Sherrod as well as by the brain oncology program given his other CNS therapies.   First Treatment Date: 2023-11-17 Last Treatment Date: 2023-12-08   Plan Name: Spine_L1 Site: Lumbar Spine Technique: 3D Mode: Photon Dose Per Fraction: 2.5 Gy Prescribed Dose (Delivered / Prescribed): 37.5 Gy / 37.5 Gy Prescribed Fxs (Delivered / Prescribed): 15 / 15   Plan Name: Spine_L5 Site: Lumbar Spine Technique: 3D Mode: Photon Dose Per Fraction: 2.5 Gy Prescribed Dose (Delivered / Prescribed): 37.5 Gy / 37.5 Gy Prescribed Fxs (Delivered / Prescribed): 15 / 15

## 2024-01-14 ENCOUNTER — Inpatient Hospital Stay: Admitting: Physician Assistant

## 2024-01-14 ENCOUNTER — Inpatient Hospital Stay

## 2024-01-14 ENCOUNTER — Other Ambulatory Visit: Payer: Self-pay

## 2024-01-14 ENCOUNTER — Telehealth: Payer: Self-pay

## 2024-01-14 ENCOUNTER — Other Ambulatory Visit: Payer: Self-pay | Admitting: Physician Assistant

## 2024-01-14 ENCOUNTER — Other Ambulatory Visit (HOSPITAL_COMMUNITY)
Admission: RE | Admit: 2024-01-14 | Discharge: 2024-01-14 | Disposition: A | Discharge status: Home / Self Care | Attending: Physician Assistant | Admitting: Physician Assistant

## 2024-01-14 DIAGNOSIS — R197 Diarrhea, unspecified: Secondary | ICD-10-CM

## 2024-01-14 DIAGNOSIS — C642 Malignant neoplasm of left kidney, except renal pelvis: Secondary | ICD-10-CM

## 2024-01-14 LAB — C DIFFICILE QUICK SCREEN W PCR REFLEX
C Diff antigen: NEGATIVE
C Diff interpretation: NOT DETECTED
C Diff toxin: NEGATIVE

## 2024-01-14 NOTE — Progress Notes (Signed)
 Error

## 2024-01-14 NOTE — Progress Notes (Signed)
 Lab orders placed for 01/15/2024 [facility] appt.

## 2024-01-14 NOTE — Telephone Encounter (Signed)
 F/up telephone call to pt regarding not coming to his appt today with Cassie the PA.  He said he was just to weak to come in. Pt is concerned about his wt, his low BP and constant diarrhea. His wife is taking a stool sample to the [facility] lab today for testing Pt was offered an appt today in [facility] but declined and pt was also advised to go to the ED for an evaluation which he also declined.  Appt made for tomorrow 10/3 in the [facility] for evaluation and possible IV fluids

## 2024-01-14 NOTE — Progress Notes (Unsigned)
 Symptom Management Consult Note [facility]    Patient Care Team: [name], NP as PCP - General (Internal Medicine)    Name / MRN / DOB: Ian Hart  [id number]  Nov 06, 1950   Date of visit: 01/15/2024   Chief Complaint/Reason for visit: diarrhea   Current Therapy: Nivolumab   Last treatment:  Day 1   Cycle 2 on 12/17/23    ASSESSMENT AND PLAN Patient is a 73 y.o. 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 Sherrod.  I have viewed most recent oncology note and lab work.  #Metastatic clear-cell renal cell carcinoma of the left kidney  - Per last oncology note 01/07/24: Yervoy  will be discontinued from cycle #3 due to diarrhea possibly immunotherapy mediated. - Next appointment with oncologist is   #Diarrhea  -CBC CMP Magnesium   Creatinine mildly elevated at last visit 1.29 with baseline around 0.8   - Stool sample in process since 01/14/24   Strict ED precautions discussed should symptoms worsen.   HEME/ONC HISTORY Oncology History  Small cell carcinoma of right lung (HCC)  04/09/2020 Initial Diagnosis   Small cell carcinoma of right lung (HCC)   04/20/2020 - 05/16/2020 Chemotherapy         05/14/2020 Cancer Staging   Staging form: Lung, AJCC 8th Edition - Clinical: cT0, cN2, cM0 - Signed by Sherrod Sherrod, MD on 05/14/2020   06/04/2020 - 06/27/2020 Chemotherapy   Patient is on Treatment Plan : LUNG SMALL CELL Carboplatin  D1 / Etoposide  D1-3 q21d     11/25/2023 -  Chemotherapy   Patient is on Treatment Plan : RENAL CELL CARCINOMA Nivolumab  (3) + Ipilimumab  (1) q21d x 4 cycles  / Nivolumab  (480) q28d         INTERVAL HISTORY  Discussed the use of AI scribe software for clinical note transcription with the patient, who gave verbal consent to proceed.    Ian Hart is a 73 y.o. male with oncologic history as above presenting to [facility] today with chief complaint  of diarrhea. Accompanied to clinic today by spouse who provides additional history.     Was prescribed medrol  dosepak on 01/07/24 for possibly immunotherapy mediated diarrhea    ROS  All other systems are reviewed and are negative for acute change except as noted in the HPI.    Allergies  Allergen Reactions   Acyclovir And Related Rash    Rash that looked like chicken pox     Past Medical History:  Diagnosis Date   History of chicken pox    History of shingles 04/2010   Lung cancer (HCC) 02/2020     Past Surgical History:  Procedure Laterality Date   BRONCHIAL BIOPSY  03/22/2020   Procedure: BRONCHIAL BIOPSIES;  Surgeon: Brenna Adine CROME, DO;  Location: [facility]OS[facility];  Service: Pulmonary;;   BRONCHIAL BRUSHINGS  03/22/2020   Procedure: BRONCHIAL BRUSHINGS;  Surgeon: Brenna Adine CROME, DO;  Location: [facility]OS[facility];  Service: Pulmonary;;   BRONCHIAL NEEDLE ASPIRATION BIOPSY  03/22/2020   Procedure: BRONCHIAL NEEDLE ASPIRATION BIOPSIES;  Surgeon: Brenna Adine CROME, DO;  Location: [facility]OS[facility];  Service: Pulmonary;;   BRONCHIAL WASHINGS  03/22/2020   Procedure: BRONCHIAL WASHINGS;  Surgeon: Brenna Adine CROME, DO;  Location: [facility];  Service: Pulmonary;;   FIDUCIAL MARKER PLACEMENT  03/22/2020   Procedure: FIDUCIAL MARKER PLACEMENT;  Surgeon: Brenna Adine CROME, DO;  Location: [facility]OS[facility];  Service: Pulmonary;;   IR IMAGING  GUIDED PORT INSERTION  11/05/2023   IR LUMBAR PUNCTURE  11/05/2023   ROBOTIC ASSITED PARTIAL NEPHRECTOMY Left 2022   VIDEO BRONCHOSCOPY WITH ENDOBRONCHIAL NAVIGATION N/A 03/22/2020   Procedure: VIDEO BRONCHOSCOPY WITH ENDOBRONCHIAL NAVIGATION;  Surgeon: Brenna Adine CROME, DO;  Location: [facility];  Service: Pulmonary;  Laterality: N/A;   VIDEO BRONCHOSCOPY WITH ENDOBRONCHIAL ULTRASOUND N/A 03/22/2020   Procedure: VIDEO BRONCHOSCOPY WITH ENDOBRONCHIAL ULTRASOUND;  Surgeon: Brenna Adine CROME, DO;  Location: [facility];  Service: Pulmonary;  Laterality:  N/A;   VIDEO BRONCHOSCOPY WITH ENDOBRONCHIAL ULTRASOUND Bilateral 11/10/2023   Procedure: BRONCHOSCOPY, WITH EBUS;  Surgeon: Shelah Lamar RAMAN, MD;  Location: [facility] END[facility]COPY;  Service: Cardiopulmonary;  Laterality: Bilateral;    Social History   Socioeconomic History   Marital status: Married    Spouse name: Not on file   Number of children: 1   Years of education: Not on file   Highest education level: Associate degree: academic program  Occupational History   Occupation: retired  Tobacco Use   Smoking status: Former    Current packs/day: 0.00    Average packs/day: 2.0 packs/day for 50.0 years (100.0 ttl pk-yrs)    Types: Cigarettes    Quit date: 07/13/2021    Years since quitting: 2.5   Smokeless tobacco: Never   Tobacco comments:    07/25/20  Vaping Use   Vaping status: Never Used  Substance and Sexual Activity   Alcohol use: Yes    Alcohol/week: 10.0 standard drinks of alcohol    Types: 10 Standard drinks or equivalent per week    Comment: 2 drinks per night   Drug use: Not Currently   Sexual activity: Yes  Other Topics Concern   Not on file  Social History Narrative   Regular exercise:  2-3 x weekly (sports, yardwork)   Caffeine use:  3 cups coffee daily   56 yr old son   Wife   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           Social Drivers of Corporate investment banker Strain: Low Risk  (08/07/2023)   Overall Financial Resource Strain (CARDIA)    Difficulty of Paying Living Expenses: Not hard at all  Food Insecurity: No Food Insecurity (09/11/2023)   Hunger Vital Sign    Worried About Running Out of Food in the Last Year: Never true    Ran Out of Food in the Last Year: Never true  Transportation Needs: No Transportation Needs (09/11/2023)   PRAPARE - Administrator, Civil Service (Medical): No    Lack of Transportation (Non-Medical): No  Physical Activity: Unknown (10/05/2023)   Exercise Vital Sign     Days of Exercise per Week: 6 days    Minutes of Exercise per Session: Not on file  Stress: Stress Concern Present (08/07/2023)   [employer] of Occupational Health - Occupational Stress Questionnaire    Feeling of Stress : To some extent  Social Connections: Socially Integrated (08/07/2023)   Social Connection and Isolation Panel    Frequency of Communication with Friends and Family: More than three times a week    Frequency of Social Gatherings with Friends and Family: Once a week    Attends Religious Services: More than 4 times per year    Active Member of [employer] or Organizations: Yes    Attends Engineer, structural: More than 4 times per year    Marital Status: Married  Intimate  Partner Violence: Not At Risk (09/11/2023)   Humiliation, Afraid, Rape, and Kick questionnaire    Fear of Current or Ex-Partner: No    Emotionally Abused: No    Physically Abused: No    Sexually Abused: No    Family History  Problem Relation Age of Onset   Sudden death Mother    Rheumatic fever Mother    COPD Brother    Liver disease Neg Hx    Colon cancer Neg Hx    Esophageal cancer Neg Hx      Current Outpatient Medications:    alendronate  (FOSAMAX ) 70 MG tablet, Take 1 tablet (70 mg total) by mouth every 7 (seven) days. Take with a full glass of water on an empty stomach., Disp: 12 tablet, Rfl: 4   Calcium Carbonate-Vit D-Min (CALTRATE 600+D PLUS MINERALS) 600-800 MG-UNIT TABS, Take 1 tablet by mouth in the morning and at bedtime., Disp: , Rfl:    lidocaine -prilocaine  (EMLA ) cream, Apply to affected area once, Disp: 30 g, Rfl: 3   LORazepam  (ATIVAN ) 0.5 MG tablet, 1 tab po 30 minutes prior to radiation or MRI scans, Disp: 10 tablet, Rfl: 0   methylPREDNISolone  (MEDROL  DOSEPAK) 4 MG TBPK tablet, Use as instructed, Disp: 21 tablet, Rfl: 0   Multiple Vitamin (MULTIVITAMIN) tablet, Take 1 tablet by mouth daily., Disp: , Rfl:    ondansetron  (ZOFRAN ) 8 MG tablet, Take 1 tablet (8 mg total) by  mouth every 8 (eight) hours as needed for nausea or vomiting., Disp: 30 tablet, Rfl: 1   prochlorperazine  (COMPAZINE ) 10 MG tablet, Take 1 tablet (10 mg total) by mouth every 6 (six) hours as needed for nausea or vomiting., Disp: 30 tablet, Rfl: 1   tamsulosin  (FLOMAX ) 0.4 MG CAPS capsule, Take 1 capsule (0.4 mg total) by mouth daily., Disp: 90 capsule, Rfl: 1   Zoster Vaccine Adjuvanted (SHINGRIX ) injection, 0.5ml IM now and again in 2-6 months, Disp: 0.5 mL, Rfl: 1  PHYSICAL EXAM ECOG FS:{CHL ONC ED:[id number]}   There were no vitals filed for this visit. Physical Exam     LABORATORY DATA I have reviewed the data as listed    Latest Ref Rng & Units 01/07/2024    9:51 AM 12/16/2023   10:40 AM 11/25/2023   11:44 AM  CBC  WBC 4.0 - 10.5 K/uL 6.6  2.9  4.6   Hemoglobin 13.0 - 17.0 g/dL 88.8  89.6  89.0   Hematocrit 39.0 - 52.0 % 32.5  30.2  31.7   Platelets 150 - 400 K/uL 300  167  170         Latest Ref Rng & Units 01/07/2024    9:51 AM 12/16/2023   10:40 AM 11/25/2023   11:44 AM  CMP  Glucose 70 - 99 mg/dL 890  891  99   BUN 8 - 23 mg/dL 21  18  17    Creatinine 0.61 - 1.24 mg/dL 8.70  9.08  9.23   Sodium 135 - 145 mmol/L 133  135  128   Potassium 3.5 - 5.1 mmol/L 4.1  4.1  4.2   Chloride 98 - 111 mmol/L 99  102  92   CO2 22 - 32 mmol/L 26  27  26    Calcium 8.9 - 10.3 mg/dL 9.1  8.5  8.9   Total Protein 6.5 - 8.1 g/dL 6.8  6.1  6.1   Total Bilirubin 0.0 - 1.2 mg/dL 0.5  0.4  0.4   Alkaline Phos 38 - 126 U/L 104  88  53   AST 15 - 41 U/L 18  22  18    ALT 0 - 44 U/L 22  25  16         RADIOGRAPHIC STUDIES (from last 24 hours if applicable) I have personally reviewed the radiological images as listed and agreed with the findings in the report. No results found.      Visit Diagnosis: No diagnosis found.   No orders of the defined types were placed in this encounter.   All questions were answered. The patient knows to call the clinic with any problems, questions or  concerns. No barriers to learning was detected.  A total of more than *** minutes were spent on this encounter with face-to-face time and non-face-to-face time, including preparing to see the patient, ordering tests and/or medications, counseling the patient and coordination of care as outlined above.    Thank you for allowing me to participate in the care of this patient.    Adell Koval E  Walisiewicz, PA-C Department of Hematology/Oncology [facility] Phone: [phone]  [phone]    01/14/2024 3:30 PM

## 2024-01-15 ENCOUNTER — Inpatient Hospital Stay: Admitting: Physician Assistant

## 2024-01-15 ENCOUNTER — Inpatient Hospital Stay (HOSPITAL_COMMUNITY)
Admission: EM | Admit: 2024-01-15 | Discharge: 2024-01-19 | DRG: 871 | Disposition: A | Discharge status: Home / Self Care | Attending: Emergency Medicine | Admitting: Emergency Medicine

## 2024-01-15 ENCOUNTER — Inpatient Hospital Stay

## 2024-01-15 ENCOUNTER — Other Ambulatory Visit: Payer: Self-pay

## 2024-01-15 ENCOUNTER — Encounter (HOSPITAL_COMMUNITY): Payer: Self-pay | Admitting: Internal Medicine

## 2024-01-15 ENCOUNTER — Other Ambulatory Visit: Payer: Self-pay | Admitting: Physician Assistant

## 2024-01-15 ENCOUNTER — Emergency Department (HOSPITAL_COMMUNITY)

## 2024-01-15 VITALS — BP 82/46 | HR 107 | Temp 98.2°F | Resp 18 | Wt 105.3 lb

## 2024-01-15 DIAGNOSIS — C799 Secondary malignant neoplasm of unspecified site: Secondary | ICD-10-CM | POA: Insufficient documentation

## 2024-01-15 DIAGNOSIS — E860 Dehydration: Secondary | ICD-10-CM

## 2024-01-15 DIAGNOSIS — R197 Diarrhea, unspecified: Secondary | ICD-10-CM

## 2024-01-15 DIAGNOSIS — D62 Acute posthemorrhagic anemia: Secondary | ICD-10-CM | POA: Diagnosis not present

## 2024-01-15 DIAGNOSIS — R000 Tachycardia, unspecified: Secondary | ICD-10-CM

## 2024-01-15 DIAGNOSIS — Z905 Acquired absence of kidney: Secondary | ICD-10-CM | POA: Insufficient documentation

## 2024-01-15 DIAGNOSIS — Z825 Family history of asthma and other chronic lower respiratory diseases: Secondary | ICD-10-CM

## 2024-01-15 DIAGNOSIS — R627 Adult failure to thrive: Secondary | ICD-10-CM | POA: Diagnosis present

## 2024-01-15 DIAGNOSIS — K529 Noninfective gastroenteritis and colitis, unspecified: Secondary | ICD-10-CM | POA: Diagnosis not present

## 2024-01-15 DIAGNOSIS — D630 Anemia in neoplastic disease: Secondary | ICD-10-CM | POA: Diagnosis present

## 2024-01-15 DIAGNOSIS — R001 Bradycardia, unspecified: Secondary | ICD-10-CM | POA: Insufficient documentation

## 2024-01-15 DIAGNOSIS — K259 Gastric ulcer, unspecified as acute or chronic, without hemorrhage or perforation: Secondary | ICD-10-CM | POA: Diagnosis present

## 2024-01-15 DIAGNOSIS — E43 Unspecified severe protein-calorie malnutrition: Secondary | ICD-10-CM | POA: Diagnosis present

## 2024-01-15 DIAGNOSIS — C649 Malignant neoplasm of unspecified kidney, except renal pelvis: Secondary | ICD-10-CM | POA: Diagnosis present

## 2024-01-15 DIAGNOSIS — Z87891 Personal history of nicotine dependence: Secondary | ICD-10-CM | POA: Insufficient documentation

## 2024-01-15 DIAGNOSIS — R64 Cachexia: Secondary | ICD-10-CM | POA: Diagnosis present

## 2024-01-15 DIAGNOSIS — R195 Other fecal abnormalities: Secondary | ICD-10-CM | POA: Diagnosis not present

## 2024-01-15 DIAGNOSIS — C7931 Secondary malignant neoplasm of brain: Secondary | ICD-10-CM | POA: Diagnosis present

## 2024-01-15 DIAGNOSIS — I959 Hypotension, unspecified: Secondary | ICD-10-CM | POA: Diagnosis present

## 2024-01-15 DIAGNOSIS — R6520 Severe sepsis without septic shock: Secondary | ICD-10-CM | POA: Diagnosis present

## 2024-01-15 DIAGNOSIS — K921 Melena: Secondary | ICD-10-CM | POA: Diagnosis present

## 2024-01-15 DIAGNOSIS — K2950 Unspecified chronic gastritis without bleeding: Secondary | ICD-10-CM | POA: Diagnosis not present

## 2024-01-15 DIAGNOSIS — Z681 Body mass index (BMI) 19 or less, adult: Secondary | ICD-10-CM | POA: Diagnosis not present

## 2024-01-15 DIAGNOSIS — C642 Malignant neoplasm of left kidney, except renal pelvis: Secondary | ICD-10-CM | POA: Diagnosis present

## 2024-01-15 DIAGNOSIS — K250 Acute gastric ulcer with hemorrhage: Secondary | ICD-10-CM

## 2024-01-15 DIAGNOSIS — R636 Underweight: Secondary | ICD-10-CM

## 2024-01-15 DIAGNOSIS — A419 Sepsis, unspecified organism: Secondary | ICD-10-CM

## 2024-01-15 DIAGNOSIS — Z7983 Long term (current) use of bisphosphonates: Secondary | ICD-10-CM

## 2024-01-15 DIAGNOSIS — J189 Pneumonia, unspecified organism: Secondary | ICD-10-CM | POA: Diagnosis present

## 2024-01-15 DIAGNOSIS — T451X5A Adverse effect of antineoplastic and immunosuppressive drugs, initial encounter: Secondary | ICD-10-CM | POA: Diagnosis present

## 2024-01-15 DIAGNOSIS — E876 Hypokalemia: Secondary | ICD-10-CM | POA: Diagnosis not present

## 2024-01-15 DIAGNOSIS — R54 Age-related physical debility: Secondary | ICD-10-CM | POA: Diagnosis present

## 2024-01-15 DIAGNOSIS — Z79899 Other long term (current) drug therapy: Secondary | ICD-10-CM | POA: Diagnosis not present

## 2024-01-15 DIAGNOSIS — Z8619 Personal history of other infectious and parasitic diseases: Secondary | ICD-10-CM

## 2024-01-15 DIAGNOSIS — N179 Acute kidney failure, unspecified: Secondary | ICD-10-CM | POA: Diagnosis present

## 2024-01-15 DIAGNOSIS — Z888 Allergy status to other drugs, medicaments and biological substances status: Secondary | ICD-10-CM

## 2024-01-15 DIAGNOSIS — N400 Enlarged prostate without lower urinary tract symptoms: Secondary | ICD-10-CM | POA: Diagnosis present

## 2024-01-15 DIAGNOSIS — K31819 Angiodysplasia of stomach and duodenum without bleeding: Secondary | ICD-10-CM | POA: Diagnosis not present

## 2024-01-15 DIAGNOSIS — Z85118 Personal history of other malignant neoplasm of bronchus and lung: Secondary | ICD-10-CM

## 2024-01-15 DIAGNOSIS — Z23 Encounter for immunization: Secondary | ICD-10-CM

## 2024-01-15 HISTORY — DX: Sepsis, unspecified organism: A41.9

## 2024-01-15 LAB — CBC WITH DIFFERENTIAL (CANCER CENTER ONLY)
Abs Immature Granulocytes: 0.23 K/uL — ABNORMAL HIGH (ref 0.00–0.07)
Basophils Absolute: 0.1 K/uL (ref 0.0–0.1)
Basophils Relative: 1 %
Eosinophils Absolute: 0.1 K/uL (ref 0.0–0.5)
Eosinophils Relative: 1 %
HCT: 33.1 % — ABNORMAL LOW (ref 39.0–52.0)
Hemoglobin: 11 g/dL — ABNORMAL LOW (ref 13.0–17.0)
Immature Granulocytes: 2 %
Lymphocytes Relative: 3 %
Lymphs Abs: 0.4 K/uL — ABNORMAL LOW (ref 0.7–4.0)
MCH: 30.5 pg (ref 26.0–34.0)
MCHC: 33.2 g/dL (ref 30.0–36.0)
MCV: 91.7 fL (ref 80.0–100.0)
Monocytes Absolute: 1.4 K/uL — ABNORMAL HIGH (ref 0.1–1.0)
Monocytes Relative: 11 %
Neutro Abs: 10.6 K/uL — ABNORMAL HIGH (ref 1.7–7.7)
Neutrophils Relative %: 82 %
Platelet Count: 371 K/uL (ref 150–400)
RBC: 3.61 MIL/uL — ABNORMAL LOW (ref 4.22–5.81)
RDW: 13.6 % (ref 11.5–15.5)
Smear Review: NORMAL
WBC Count: 12.8 K/uL — ABNORMAL HIGH (ref 4.0–10.5)
nRBC: 0 % (ref 0.0–0.2)

## 2024-01-15 LAB — CMP (CANCER CENTER ONLY)
ALT: 21 U/L (ref 0–44)
AST: 12 U/L — ABNORMAL LOW (ref 15–41)
Albumin: 3.5 g/dL (ref 3.5–5.0)
Alkaline Phosphatase: 96 U/L (ref 38–126)
Anion gap: 6 (ref 5–15)
BUN: 27 mg/dL — ABNORMAL HIGH (ref 8–23)
CO2: 27 mmol/L (ref 22–32)
Calcium: 9.6 mg/dL (ref 8.9–10.3)
Chloride: 99 mmol/L (ref 98–111)
Creatinine: 1.42 mg/dL — ABNORMAL HIGH (ref 0.61–1.24)
GFR, Estimated: 52 mL/min — ABNORMAL LOW (ref >60)
Glucose, Bld: 116 mg/dL — ABNORMAL HIGH (ref 70–99)
Potassium: 4 mmol/L (ref 3.5–5.1)
Sodium: 132 mmol/L — ABNORMAL LOW (ref 135–145)
Total Bilirubin: 0.4 mg/dL (ref 0.0–1.2)
Total Protein: 6.7 g/dL (ref 6.5–8.1)

## 2024-01-15 LAB — GI PATHOGEN PANEL BY PCR, STOOL

## 2024-01-15 LAB — MAGNESIUM: Magnesium: 1.1 mg/dL — ABNORMAL LOW (ref 1.7–2.4)

## 2024-01-15 LAB — MRSA NEXT GEN BY PCR, NASAL: MRSA by PCR Next Gen: NOT DETECTED

## 2024-01-15 LAB — I-STAT CG4 LACTIC ACID, ED: Lactic Acid, Venous: 0.7 mmol/L (ref 0.5–1.9)

## 2024-01-15 MED ORDER — ONDANSETRON HCL 4 MG/2ML IJ SOLN
4.0000 mg | Freq: Four times a day (QID) | INTRAMUSCULAR | Status: DC | PRN
  Administered 2024-01-19: 4 mg via INTRAVENOUS

## 2024-01-15 MED ORDER — IPRATROPIUM-ALBUTEROL 0.5-2.5 (3) MG/3ML IN SOLN: 3.0000 mL | Freq: Four times a day (QID) | RESPIRATORY_TRACT | Status: DC | PRN

## 2024-01-15 MED ORDER — CHLORHEXIDINE GLUCONATE CLOTH 2 % EX PADS
6.0000 | MEDICATED_PAD | Freq: Every day | CUTANEOUS | Status: DC
  Administered 2024-01-15 – 2024-01-19 (×5): 6 via TOPICAL

## 2024-01-15 MED ORDER — SODIUM CHLORIDE 0.9 % IV SOLN
1.0000 g | INTRAVENOUS | Status: DC
  Administered 2024-01-15 – 2024-01-18 (×4): 1 g via INTRAVENOUS
  Filled 2024-01-15 (×5): qty 10

## 2024-01-15 MED ORDER — MAGNESIUM SULFATE 2 GM/50ML IV SOLN
2.0000 g | Freq: Once | INTRAVENOUS | Status: DC
  Filled 2024-01-15: qty 50

## 2024-01-15 MED ORDER — INFLUENZA VAC SPLIT HIGH-DOSE 0.5 ML IM SUSY
0.5000 mL | PREFILLED_SYRINGE | INTRAMUSCULAR | Status: AC
  Administered 2024-01-16: 0.5 mL via INTRAMUSCULAR
  Filled 2024-01-15: qty 0.5

## 2024-01-15 MED ORDER — LACTATED RINGERS IV SOLN: INTRAVENOUS | Status: AC

## 2024-01-15 MED ORDER — ONDANSETRON HCL 4 MG PO TABS: 4.0000 mg | ORAL_TABLET | Freq: Four times a day (QID) | ORAL | Status: DC | PRN

## 2024-01-15 MED ORDER — SODIUM CHLORIDE 0.9% FLUSH: 10.0000 mL | INTRAVENOUS | Status: DC | PRN

## 2024-01-15 MED ORDER — SODIUM CHLORIDE 0.9% FLUSH
10.0000 mL | Freq: Two times a day (BID) | INTRAVENOUS | Status: DC
  Administered 2024-01-15 – 2024-01-18 (×7): 10 mL

## 2024-01-15 MED ORDER — ACETAMINOPHEN 500 MG PO TABS: 1000.0000 mg | ORAL_TABLET | Freq: Four times a day (QID) | ORAL | Status: DC | PRN

## 2024-01-15 MED ORDER — ORAL CARE MOUTH RINSE: 15.0000 mL | OROMUCOSAL | Status: DC | PRN

## 2024-01-15 MED ORDER — LACTATED RINGERS IV BOLUS
1000.0000 mL | Freq: Once | INTRAVENOUS | Status: AC
  Administered 2024-01-15: 1000 mL via INTRAVENOUS

## 2024-01-15 MED ORDER — SODIUM CHLORIDE 0.9 % IV SOLN: Freq: Once | INTRAVENOUS | Status: AC

## 2024-01-15 MED ORDER — ACETAMINOPHEN 650 MG RE SUPP: 650.0000 mg | Freq: Four times a day (QID) | RECTAL | Status: DC | PRN

## 2024-01-15 MED ORDER — SODIUM CHLORIDE 0.9 % IV SOLN
500.0000 mg | INTRAVENOUS | Status: DC
  Administered 2024-01-15 – 2024-01-17 (×3): 500 mg via INTRAVENOUS
  Filled 2024-01-15 (×4): qty 5

## 2024-01-15 MED ORDER — BENZONATATE 100 MG PO CAPS
100.0000 mg | ORAL_CAPSULE | Freq: Two times a day (BID) | ORAL | Status: DC
  Administered 2024-01-15 – 2024-01-18 (×7): 100 mg via ORAL
  Filled 2024-01-15 (×7): qty 1

## 2024-01-15 MED ORDER — LACTATED RINGERS IV SOLN: INTRAVENOUS | Status: DC

## 2024-01-15 MED ORDER — TAMSULOSIN HCL 0.4 MG PO CAPS
0.4000 mg | ORAL_CAPSULE | Freq: Every day | ORAL | Status: DC
  Administered 2024-01-16 – 2024-01-18 (×3): 0.4 mg via ORAL
  Filled 2024-01-15 (×3): qty 1

## 2024-01-15 MED ORDER — HEPARIN SODIUM (PORCINE) 5000 UNIT/ML IJ SOLN
5000.0000 [IU] | Freq: Three times a day (TID) | INTRAMUSCULAR | Status: AC
  Administered 2024-01-15 – 2024-01-18 (×9): 5000 [IU] via SUBCUTANEOUS
  Filled 2024-01-15 (×10): qty 1

## 2024-01-15 MED ORDER — MAGNESIUM SULFATE 2 GM/50ML IV SOLN
2.0000 g | Freq: Once | INTRAVENOUS | Status: AC
  Administered 2024-01-15: 2 g via INTRAVENOUS
  Filled 2024-01-15: qty 50

## 2024-01-15 NOTE — Assessment & Plan Note (Addendum)
 01/15/24 currently undergoing therapy with oncology

## 2024-01-15 NOTE — ED Notes (Signed)
Pt ambulated to restroom, one assist

## 2024-01-15 NOTE — Assessment & Plan Note (Addendum)
 01/15/24 admit to SDU. IV rocephin, po zithromax. Check legionella and strep pneumo. Given his episodic vomiting, could consider aspiration pneumonia but his infiltrates on left side.

## 2024-01-15 NOTE — ED Notes (Signed)
 Unable to obtain 2nd set of cultures,  blue top of 1st set only by straight stick

## 2024-01-15 NOTE — Assessment & Plan Note (Signed)
 01/15/24 replete with IV Mg. Recheck in AM.

## 2024-01-15 NOTE — Patient Instructions (Signed)

## 2024-01-15 NOTE — ED Notes (Signed)
 Lactic and culture drawn prior to starting antibiotic

## 2024-01-15 NOTE — Hospital Course (Addendum)
 73 year old male history of metastatic renal cell carcinoma, history of lung cancer small cell, failure to thrive, BPH, presents to the ER from the heme-onc clinic.  Patient has been having diarrhea for the last 6 weeks ever since he received his first immunomodulator infusion in November 25, 2023.  He states he developed a rash and diarrhea since then.  He has been having 5-6 loose watery bowel movements every day.  Daily nausea with occasional vomiting.  He has had chills and cough He recently treated with a 6-day Medrol  Dosepak which he states that did help his abdominal pain but did nothing for his diarrhea.  He was seen in clinic,noted to be hypotensive and sent to the ER for evaluation. On arrival temp 98 heart rate 106 blood pressure 96/53 satting 97% on room air Labs showed hypomagnesemia and leukocytosis 12.7  BUN 27, Scr 1.4  alb 3.4, AST 12, ALT 21, alk phos 96 Recent C. Diff Neg antigen, negative toxin CT abd/pelvis>>No acute findings in the abdomen/pelvis.Airspace process over the left lower lobe likely a pneumonia. 3. Minimal colonic diverticulosis 4. Subcentimeter hypodensity over the upper pole right kidney too small to characterize, but likely a hyperdense cyst. 5. Aortic atherosclerosis. Patient was placed on antibiotics and admitted for pneumonia/sepsis. Sepsis pneumonia improving antibiotics.  He had drop in hemoglobin, seen by GI FOBT positive plan for EGD 10/7. Patient is hoping to go home soon  Subjective: Seen and examined He is hoping to go home today  Discharge diagnosis   Sepsis POA LLL CAP: Patient with soft blood pressure leukocytosis CT abdomen pelvis with left lower lobe pneumonia. Blood culture no growth to date.Overall clinically improved-WBC normalized. BP relatively stable on midodrine and weaning off . Continue Rocephin and azithromycin while here changed to oral antibiotics upon discharge.    Chronic diarrhea: After immunotherapy use on November 25, 2023(nivolumab  (OPDIVO ), ipilimumab  (YERVOY ) ). Some relief from abdominal pain following Medrol  Dosepak. Continue Imodium, oncology has seen the patient.     AKI Resolved.   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  Hemoglobin trending up.  Plan for EGD, continue PPI  EGD completed recent GI bleed secondary to pyloric channel ulcer no active bleeding or stigmata biopsies taken for H. pylori otherwise unremarkable advised PPI twice daily x 4 weeks then 40 daily indefinitely okay to discharge from GI standpoint after diet. Recent Labs    01/15/24 1140 01/16/24 0446 01/17/24 0439 01/18/24 0441 01/18/24 0442 01/18/24 1249 01/19/24 0520  HGB 11.0* 9.8* 7.8* 7.9*  --   --  8.3*  MCV 91.7 95.4 97.6 95.6  --   --  96.4  VITAMINB12  --   --   --   --   --  707  --   FOLATE  --   --   --   --   --  12.4  --   FERRITIN  --   --   --   --   --  600*  --   TIBC  --   --   --   --   --  185*  --   IRON  --   --   --   --   --  48  --   RETICCTPCT  --   --   --   --  1.3  --   --     Severe malnutrition Body mass index is 14.51 kg/m.: Augment diet.  RD following  DVT prophylaxis: heparin  injection 5,000 Units Start: 01/15/24 2200 SCDs Start: 01/15/24 1835 Code Status:   Code Status: Full Code Family Communication: plan of care discussed with patient at bedside. Patient status is: Remains hospitalized because of severity of illness Level of care: Telemetry   Dispo: The patient is from: home            Anticipated disposition: home pending egd result Objective: Vitals last 24 hrs: Vitals:   01/19/24 1420 01/19/24 1425 01/19/24 1430 01/19/24 1435  BP:  (!) 105/54 (!) 101/55   Pulse: 96 89 92 87  Resp: 18  (!) 22 20 20   Temp:      TempSrc:      SpO2: 97% 96% 95% 97%  Weight:      Height:        Physical Examination: General exam: AAOX3, thin frail HEENT:Oral mucosa moist, Ear/Nose WNL grossly Respiratory system: CTA,no use of accessory muscle. Cardiovascular system: S1 & S2 +,No,JVD. Gastrointestinal system: Abdomen soft,NT,ND,BS+ Nervous System: Alert, awake, moving all extremities,and following commands. Extremities: extremities warm, leg edema neg. Skin: No rashes,no icterus. MSK: Normal muscle bulk,tone, power.

## 2024-01-15 NOTE — Assessment & Plan Note (Addendum)
 01/15/24 started after his immunotherapy on November 25, 2023(nivolumab  (OPDIVO ), ipilimumab  (YERVOY ) ). Pt had course of medrol -dose pack for 6 days. He states that abd pain did improved on steroids but not diarrhea. Oncology consulted to decide if he needs more steroids therapy. Sent message to both Dr. Onesimo and Sherrod.

## 2024-01-15 NOTE — Progress Notes (Signed)
 Pt transported via w/c to ED by Kate W PA-C and Kaitlyn NT.

## 2024-01-15 NOTE — H&P (Addendum)
 History and Physical    Ian Hart FMW:[id number] DOB: Jul 20, 1950 DOA: 01/15/2024  DOS: the patient was seen and examined on 01/15/2024  PCP: [name], NP   Patient coming from: Clinic  I have personally briefly reviewed patient's old medical records in [facility]  CC: sent from heme/onc clinic due to hypotension HPI: 73 year old male history of metastatic renal cell carcinoma, history of lung cancer small cell, failure to thrive, BPH, presents to the ER from the heme-onc clinic.  Patient has been having diarrhea for the last 6 weeks ever since he received his first immunomodulator infusion in November 25, 2023.  He states he developed a rash and diarrhea since then.  He has been having 5-6 loose watery bowel movements every day.  Daily nausea with occasional vomiting.  He has had chills that started last night.  He has had a cough.  He was recently treated with a 6-day Medrol  Dosepak which he states that did help his abdominal pain but did nothing for his diarrhea.  He was seen in clinic today and was noted to be hypotensive and sent to the ER for evaluation.  On arrival temp 98 heart rate 106 blood pressure 96/53 satting 97% on room air  Significant Events: Admitted 01/15/2024 CAP, sepsis, AKI   Admission Labs: Magnesium  1.1 Na 132, K 4.0, CO2 of 27, BUN 27, Scr 1.42, glu 116 T prot 6.7, alb 3.4, AST 12, ALT 21, alk phos 96, t. Bili 0.4 WBC 12.8, Hg 11, plt 371 Recent C. Diff Neg antigen, negative toxin  Admission Imaging Studies: CT abd/pelvis No acute findings in the abdomen/pelvis. 2. Airspace process over the left lower lobe likely a pneumonia. 3. Minimal colonic diverticulosis 4. Subcentimeter hypodensity over the upper pole right kidney too small to characterize, but likely a hyperdense cyst. 5. Aortic atherosclerosis.  Significant Labs:   Significant Imaging Studies:   Antibiotic Therapy: Anti-infectives (From admission, onward)    Start      Dose/Rate Route Frequency Ordered Stop   01/15/24 1645  azithromycin (ZITHROMAX) 500 mg in sodium chloride  0.9 % 250 mL IVPB        500 mg 250 mL/hr over 60 Minutes Intravenous Every 24 hours 01/15/24 1638     01/15/24 1645  cefTRIAXone (ROCEPHIN) 1 g in sodium chloride  0.9 % 100 mL IVPB        1 g 200 mL/hr over 30 Minutes Intravenous Every 24 hours 01/15/24 1638         Procedures:   Consultants: oncology    ED Course: given IVF 1 liter. IV rocephin/zitthromax.   Review of Systems:  Review of Systems  Constitutional:  Positive for chills, malaise/fatigue and weight loss. Negative for fever.  HENT: Negative.    Eyes: Negative.   Respiratory:  Positive for cough and shortness of breath.   Cardiovascular: Negative.   Gastrointestinal:  Positive for abdominal pain, diarrhea, nausea and vomiting.  Genitourinary: Negative.   Musculoskeletal: Negative.   Skin: Negative.   Neurological: Negative.   Endo/Heme/Allergies: Negative.   Psychiatric/Behavioral: Negative.    All other systems reviewed and are negative.   Past Medical History:  Diagnosis Date   History of chicken pox    History of shingles 04/2010   Lung cancer (HCC) 02/2020   Routine general medical examination at a health care facility 11/10/2011    Past Surgical History:  Procedure Laterality Date   BRONCHIAL BIOPSY  03/22/2020   Procedure: BRONCHIAL BIOPSIES;  Surgeon: Brenna Cain  L, DO;  Location: [facility];  Service: Pulmonary;;   BRONCHIAL BRUSHINGS  03/22/2020   Procedure: BRONCHIAL BRUSHINGS;  Surgeon: Brenna Adine CROME, DO;  Location: [facility];  Service: Pulmonary;;   BRONCHIAL NEEDLE ASPIRATION BIOPSY  03/22/2020   Procedure: BRONCHIAL NEEDLE ASPIRATION BIOPSIES;  Surgeon: Brenna Adine CROME, DO;  Location: [facility];  Service: Pulmonary;;   BRONCHIAL WASHINGS  03/22/2020   Procedure: BRONCHIAL WASHINGS;  Surgeon: Brenna Adine CROME, DO;  Location: [facility];  Service: Pulmonary;;    FIDUCIAL MARKER PLACEMENT  03/22/2020   Procedure: FIDUCIAL MARKER PLACEMENT;  Surgeon: Brenna Adine CROME, DO;  Location: [facility];  Service: Pulmonary;;   IR IMAGING GUIDED PORT INSERTION  11/05/2023   IR LUMBAR PUNCTURE  11/05/2023   ROBOTIC ASSITED PARTIAL NEPHRECTOMY Left 2022   VIDEO BRONCHOSCOPY WITH ENDOBRONCHIAL NAVIGATION N/A 03/22/2020   Procedure: VIDEO BRONCHOSCOPY WITH ENDOBRONCHIAL NAVIGATION;  Surgeon: Brenna Adine CROME, DO;  Location: [facility];  Service: Pulmonary;  Laterality: N/A;   VIDEO BRONCHOSCOPY WITH ENDOBRONCHIAL ULTRASOUND N/A 03/22/2020   Procedure: VIDEO BRONCHOSCOPY WITH ENDOBRONCHIAL ULTRASOUND;  Surgeon: Brenna Adine CROME, DO;  Location: [facility];  Service: Pulmonary;  Laterality: N/A;   VIDEO BRONCHOSCOPY WITH ENDOBRONCHIAL ULTRASOUND Bilateral 11/10/2023   Procedure: BRONCHOSCOPY, WITH EBUS;  Surgeon: Shelah Lamar RAMAN, MD;  Location: [facility] ENDOSCOPY;  Service: Cardiopulmonary;  Laterality: Bilateral;     reports that he quit smoking about 2 years ago. His smoking use included cigarettes. He has a 100 pack-year smoking history. He has never used smokeless tobacco. He reports current alcohol use of about 10.0 standard drinks of alcohol per week. He reports that he does not currently use drugs.  Allergies  Allergen Reactions   Acyclovir And Related Rash and Other (See Comments)    Rash that looked like chicken pox    Family History  Problem Relation Age of Onset   Sudden death Mother    Rheumatic fever Mother    COPD Brother    Liver disease Neg Hx    Colon cancer Neg Hx    Esophageal cancer Neg Hx     Prior to Admission medications   Medication Sig Start Date End Date Taking? Authorizing Provider  alendronate  (FOSAMAX ) 70 MG tablet Take 1 tablet (70 mg total) by mouth every 7 (seven) days. Take with a full glass of water on an empty stomach. Patient taking differently: Take 70 mg by mouth every Friday. Take with a full glass of water on an empty  stomach. 10/13/23  Yes O'Sullivan, Melissa, NP  Calcium Carbonate-Vit D-Min (CALTRATE 600+D PLUS MINERALS) 600-800 MG-UNIT TABS Take 1 tablet by mouth in the morning and at bedtime. 10/13/23   O'Sullivan, Melissa, NP  lidocaine -prilocaine  (EMLA ) cream Apply to affected area once 11/18/23   Sherrod Sherrod, MD  LORazepam  (ATIVAN ) 0.5 MG tablet 1 tab po 30 minutes prior to radiation or MRI scans 06/02/23   Lanell Donald Stagger, PA-C  methylPREDNISolone  (MEDROL  DOSEPAK) 4 MG TBPK tablet Use as instructed 01/07/24   Heilingoetter, Cassandra L, PA-C  Multiple Vitamin (MULTIVITAMIN) tablet Take 1 tablet by mouth daily.    [provider]  ondansetron  (ZOFRAN ) 8 MG tablet Take 1 tablet (8 mg total) by mouth every 8 (eight) hours as needed for nausea or vomiting. 11/18/23   Sherrod Sherrod, MD  prochlorperazine  (COMPAZINE ) 10 MG tablet Take 1 tablet (10 mg total) by mouth every 6 (six) hours as needed for nausea or vomiting. 11/18/23   Sherrod Sherrod, MD  tamsulosin  (FLOMAX )  0.4 MG CAPS capsule Take 1 capsule (0.4 mg total) by mouth daily. 10/23/23   O'Sullivan, Melissa, NP  Zoster Vaccine Adjuvanted (SHINGRIX ) injection 0.5ml IM now and again in 2-6 months Patient not taking: Reported on 01/15/2024 07/30/22   [name], NP    Physical Exam: Vitals:   01/15/24 1414 01/15/24 1428 01/15/24 1530  BP: (!) 96/53  (!) 84/67  Pulse: (!) 106  88  Resp: 18  (!) 8  Temp: 98 F (36.7 C)    TempSrc: Oral    SpO2: 97% 97% 98%    Physical Exam Vitals and nursing note reviewed.  Constitutional:      General: He is not in acute distress.    Appearance: He is not toxic-appearing.     Comments: Chronically ill-appearing, underweight malnourished  HENT:     Head: Normocephalic and atraumatic.  Eyes:     General: No scleral icterus. Cardiovascular:     Rate and Rhythm: Normal rate and regular rhythm.  Pulmonary:     Effort: Pulmonary effort is normal. No respiratory distress.     Breath sounds:  Rales present.     Comments: Left basilar rales Abdominal:     General: Bowel sounds are normal. There is no distension.     Palpations: Abdomen is soft.     Tenderness: There is abdominal tenderness. There is no rebound.     Comments: Generalized abdominal tenderness.  No rebound no guarding.  Nonsurgical abdomen.  Musculoskeletal:     Comments: Port-A-Cath right anterior chest wall.  Skin:    General: Skin is warm and dry.  Neurological:     General: No focal deficit present.     Mental Status: He is alert and oriented to person, place, and time.      Labs on Admission: I have personally reviewed following labs and imaging studies  CBC: Recent Labs  Lab 01/15/24 1140  WBC 12.8*  NEUTROABS 10.6*  HGB 11.0*  HCT 33.1*  MCV 91.7  PLT 371   Basic Metabolic Panel: Recent Labs  Lab 01/15/24 1140  NA 132*  K 4.0  CL 99  CO2 27  GLUCOSE 116*  BUN 27*  CREATININE 1.42*  CALCIUM 9.6  MG 1.1*   GFR: Estimated Creatinine Clearance: 31.3 mL/min (A) (by C-G formula based on SCr of 1.42 mg/dL (H)). Liver Function Tests: Recent Labs  Lab 01/15/24 1140  AST 12*  ALT 21  ALKPHOS 96  BILITOT 0.4  PROT 6.7  ALBUMIN 3.5   Radiological Exams on Admission: I have personally reviewed images CT ABDOMEN PELVIS WO CONTRAST Result Date: 01/15/2024 CLINICAL DATA:  Abdominal pain and diarrhea 6 days. History of lung cancer. EXAM: CT ABDOMEN AND PELVIS WITHOUT CONTRAST TECHNIQUE: Multidetector CT imaging of the abdomen and pelvis was performed following the standard protocol without IV contrast. RADIATION DOSE REDUCTION: This exam was performed according to the departmental dose-optimization program which includes automated exposure control, adjustment of the mA and/or kV according to patient size and/or use of iterative reconstruction technique. COMPARISON:  PET-CT 10/22/2023 FINDINGS: Lower chest: Heart is normal size. Calcification over the mitral valve annulus. Central venous  catheter tip over the cavoatrial junction. Minimal calcified plaque over the descending thoracic aorta. Lung bases demonstrate airspace process over the left lower lobe likely infection. No effusion. Hepatobiliary: Liver and biliary tree are unchanged with small 1 cm hypodensity over the right lobe likely a cyst. Gallbladder not well visualized. Pancreas: Normal. Spleen: Normal. Adrenals/Urinary Tract: Adrenal glands are  normal. Kidneys are normal in size without hydronephrosis or nephrolithiasis. Hyperdense subcentimeter upper pole right renal cortical focus likely hemorrhagic cyst as Hounsfield unit measurements are 64. Ureters are unremarkable without evidence of stones. Bladder is mildly distended but otherwise unremarkable. Stomach/Bowel: Stomach and small bowel are within normal. Appendix is normal. Minimal diverticulosis of the colon which is otherwise unremarkable. Vascular/Lymphatic: Mild calcified plaque over the abdominal aorta which is otherwise normal in caliber. Remaining vascular structures are unremarkable on this noncontrast exam. No definite adenopathy. Reproductive: Prostate is unremarkable. Other: No definite free fluid or focal inflammatory change. Musculoskeletal: Degenerative changes of the spine. No focal abnormality. IMPRESSION: 1. No acute findings in the abdomen/pelvis. 2. Airspace process over the left lower lobe likely a pneumonia. 3. Minimal colonic diverticulosis. 4. Subcentimeter hypodensity over the upper pole right kidney too small to characterize, but likely a hyperdense cyst. 5. Aortic atherosclerosis. Aortic Atherosclerosis (ICD10-I70.0). Electronically Signed   By: Toribio Agreste M.D.   On: 01/15/2024 16:34    EKG: My personal interpretation of EKG shows: sinus tachycardia    Assessment/Plan Principal Problem:   CAP (community acquired pneumonia) Active Problems:   Sepsis with acute organ dysfunction without septic shock (HCC)   AKI (acute kidney injury)   Chronic  diarrhea   Clear cell renal cell carcinoma, left (HCC)   Metastasis to brain (HCC)   Underweight (BMI < 18.5)   Hypomagnesemia    Assessment and Plan: * CAP (community acquired pneumonia) 01/15/24 admit to SDU. IV rocephin, po zithromax. Check legionella and strep pneumo. Given his episodic vomiting, could consider aspiration pneumonia but his infiltrates on left side.   Chronic diarrhea 01/15/24 started after his immunotherapy on November 25, 2023(nivolumab  (OPDIVO ), ipilimumab  (YERVOY ) ). Pt had course of medrol -dose pack for 6 days. He states that abd pain did improved on steroids but not diarrhea. Oncology consulted to decide if he needs more steroids therapy. Sent message to both Dr. Onesimo and Sherrod.  AKI (acute kidney injury) 01/15/24 due to dehydration from chronic diarrhea. Continue with IVF.  Sepsis with acute organ dysfunction without septic shock (HCC) 01/15/24 present on admission. Meets criteria HR 105, SBP 96, Scr elevated to 1.42. sepsis due to pneumonia, diarrhea  Underweight (BMI < 18.5) 01/15/24 Estimated body mass index is 13.89 kg/m as calculated from the following:   Height as of 12/16/23: 6' 1 (1.854 m).   Weight as of an earlier encounter on 01/15/24: 47.8 kg.    Metastasis to brain [facility]) 01/15/24 Currently undergoing therapy with oncology.  Status post XRT with radiation oncology  Clear cell renal cell carcinoma, left (HCC) 01/15/24 currently undergoing therapy with oncology  Hypomagnesemia 01/15/24 replete with IV Mg. Recheck in AM.    DVT prophylaxis: SQ Heparin  Code Status: Full Code Family Communication: no family at bedside, he is decisional  Disposition Plan: return home  Consults called: oncology(message send to [place])  Admission status: Inpatient, Step Down Unit  Camellia Door, DO [facility] 01/15/2024, 5:39 PM

## 2024-01-15 NOTE — Assessment & Plan Note (Addendum)
 01/15/24 Currently undergoing therapy with oncology.  Status post XRT with radiation oncology

## 2024-01-15 NOTE — Assessment & Plan Note (Addendum)
 01/15/24 present on admission. Meets criteria HR 105, SBP 96, Scr elevated to 1.42. sepsis due to pneumonia, diarrhea

## 2024-01-15 NOTE — ED Triage Notes (Signed)
 Pt brought over from cancer center urgent care for abdominal pain 8/10 and diarrhea x 6 days .  Pt was hypotensive with initial pressure of 78/51  after 1-1.2 L of NS pt pressure was 82/46 Cancer tx Renal (current)   Port access at cancer center

## 2024-01-15 NOTE — ED Provider Notes (Signed)
 [place] EMERGENCY DEPARTMENT AT [facility] Provider Note   CSN: [id number] Arrival date & time: 01/15/24  1404     Patient presents with: Abdominal Pain   Ian Hart is a 73 y.o. male.   73 year old with history of renal cell cancer presents from the cancer center due to hypotension and likely colitis caused from patient's chemotherapy.  States he has had diarrhea for about 6 weeks.  Had a C. difficile study recently that was negative.  Noticed decreased oral intake as well as weakness.  No fever.  Endorses left-sided abdominal discomfort that is only worse when he sits up.  No urinary symptoms.  No hematemesis.  Was at the cancer center today and was found to be hypotensive.  Given 1 L of fluid and blood pressure still remains soft.  Patient escorted by the cancer center APP who I spoke with.  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  He will need to be admitted according to her.       Prior to Admission medications   Medication Sig Start Date End Date Taking? Authorizing Provider  alendronate  (FOSAMAX ) 70 MG tablet Take 1 tablet (70 mg total) by mouth every 7 (seven) days. Take with a full glass of water on an empty stomach. 10/13/23   O'Sullivan, Melissa, NP  Calcium Carbonate-Vit D-Min (CALTRATE 600+D PLUS MINERALS) 600-800 MG-UNIT TABS Take 1 tablet by mouth in the morning and at bedtime. 10/13/23   O'Sullivan, Melissa, NP  lidocaine -prilocaine  (EMLA ) cream Apply to affected area once 11/18/23   Sherrod Sherrod, MD  LORazepam  (ATIVAN ) 0.5 MG tablet 1 tab po 30 minutes prior to radiation or MRI scans 06/02/23   Lanell Donald Stagger, PA-C  methylPREDNISolone  (MEDROL  DOSEPAK) 4 MG TBPK tablet Use as instructed 01/07/24   Heilingoetter, Cassandra L, PA-C  Multiple Vitamin (MULTIVITAMIN) tablet Take 1 tablet by mouth daily.    [provider]  ondansetron  (ZOFRAN ) 8 MG tablet Take 1 tablet (8 mg total) by mouth every 8 (eight) hours as needed for  nausea or vomiting. 11/18/23   Sherrod Sherrod, MD  prochlorperazine  (COMPAZINE ) 10 MG tablet Take 1 tablet (10 mg total) by mouth every 6 (six) hours as needed for nausea or vomiting. 11/18/23   Sherrod Sherrod, MD  tamsulosin  (FLOMAX ) 0.4 MG CAPS capsule Take 1 capsule (0.4 mg total) by mouth daily. 10/23/23   O'Sullivan, Melissa, NP  Zoster Vaccine Adjuvanted (SHINGRIX ) injection 0.5ml IM now and again in 2-6 months 07/30/22   O'Sullivan, Melissa, NP    Allergies: Acyclovir and related    Review of Systems  All other systems reviewed and are negative.   Updated Vital Signs BP (!) 96/53 (BP Location: Left Arm)   Pulse (!) 106   Temp 98 F (36.7 C) (Oral)   Resp 18   SpO2 97%   Physical Exam Vitals and nursing note reviewed.  Constitutional:      General: He is not in acute distress.    Appearance: He is underweight. He is not toxic-appearing.  HENT:     Head: Normocephalic and atraumatic.  Eyes:     General: Lids are normal.     Conjunctiva/sclera: Conjunctivae normal.     Pupils: Pupils are equal, round, and reactive to light.  Neck:     Thyroid : No thyroid  mass.     Trachea: No tracheal deviation.  Cardiovascular:     Rate and Rhythm: Normal rate and regular rhythm.     Heart sounds: Normal  heart sounds. No murmur heard.    No gallop.  Pulmonary:     Effort: Pulmonary effort is normal. No respiratory distress.     Breath sounds: Normal breath sounds. No stridor. No decreased breath sounds, wheezing, rhonchi or rales.  Abdominal:     General: There is no distension.     Palpations: Abdomen is soft.     Tenderness: There is no abdominal tenderness. There is no rebound.  Musculoskeletal:        General: No tenderness. Normal range of motion.     Cervical back: Normal range of motion and neck supple.  Skin:    General: Skin is warm and dry.     Findings: No abrasion or rash.  Neurological:     Mental Status: He is alert and oriented to person, place, and time. Mental  status is at baseline.     GCS: GCS eye subscore is 4. GCS verbal subscore is 5. GCS motor subscore is 6.     Cranial Nerves: No cranial nerve deficit.     Sensory: No sensory deficit.     Motor: Motor function is intact.  Psychiatric:        Attention and Perception: Attention normal.        Speech: Speech normal.        Behavior: Behavior normal.     (all labs ordered are listed, but only abnormal results are displayed) Labs Reviewed - No data to display  EKG: None  Radiology: No results found.   Procedures   Medications Ordered in the ED  lactated ringers  bolus 1,000 mL (has no administration in time range)  lactated ringers  infusion (has no administration in time range)                                    Medical Decision Making Amount and/or Complexity of Data Reviewed Radiology: ordered.  Risk Prescription drug management.   Patient given IV fluids here and is feeling better.  Abdominal CT showed no colitis but possible pneumonia.  Will add blood cultures and lactate.  Will start on IV antibiotics.  Will require hospitalist admission     Final diagnoses:  None    ED Discharge Orders     None          Dasie Faden, MD 01/15/24 [id number]

## 2024-01-15 NOTE — Assessment & Plan Note (Addendum)
 01/15/24 due to dehydration from chronic diarrhea. Continue with IVF.

## 2024-01-15 NOTE — Progress Notes (Deleted)
 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  PRIOR THERAPY: Systemic chemotherapy/radiation with cisplatin  80 mg per metered squared on day 1 and etoposide  100 mg per metered squared on days 1, 2, and 3 IV every 3 weeks.  Status post 4 cycles.  Starting from cycle #3, cisplatin  was changed to carboplatin  for an AUC of 5 due to renal insufficiency. First dose on 04/20/20. Onpro was added to the treatment plan starting from cycle #3 due to pancytopenia.  Radiation to the right lung, completed on 06/01/20  PCI under the care of Dr. Dewey. Completed on 08/31/20  Robotic nephrectomy on 01/28/21 under the care of Dr. Tommi at [facility]  [facility] under the care of Dr. Dewey for a SRS to the metastatic brain lesions, completed on 01/30/23  Radiation to L1 and L5 under the care of Dr. Dewey. Last dose on 12/08/23  CURRENT THERAPY: First-line treatment with immunotherapy with ipilimumab  1 Mg/KG in addition to nivolumab  360 Mg IV every 3 weeks for 4 cycles followed by maintenance treatment with nivolumab  480 Mg IV every 4 weeks.  First dose of treatment November 25, 2023.  Status post 3 cycles.  Yervoy  will be discontinued from cycle #3 due to diarrhea possibly immunotherapy mediated.   INTERVAL HISTORY: Ian Hart 73 y.o. male returns to the clinic today for a follow up visit. The patient was last seen in the clinic by myself and Dr. Sherrod on 12/16/23. The patient is currently on immunotherapy for his metastatic clear cell renal cell carcinoma of the left kidney. He is status post 2 cycles of immunotherapy.  The patient had been experiencing  persistent diarrhea with 7-8 episodes of diarrhea per day.  He was not having diarrhea prior to his immunotherapy.  He used a whole box of Imodium without any improvement.  Therefore, he was given a medrol  Dosepak for possible immunotherapy mediated diarrhea. He also received IVF.   His diarrhea has ***. His BP was also low last week and he was instructed to purchase a BP cuff and monitor this closely at home. His BP has *** at this time.   He also saw a member of the nutritionist team.   He denies any abdominal pain at this time. He denies any fevers.  He denies any recent antibiotic use.  He does not have a blood pressure cuff at home.  He is not on any antihypertensives.  He denies any history of arrhythmia.  ***Did not eat any breakfast this morning.   He reports he was weighing 130 pounds prior to treatment.  He is down to *** pounds.  H   He is currently taking Zofran  8 mg and Compazine  10 mg for nausea, finding some relief with the latter.   Denies any cough, chest pain, or hemoptysis.  He may get shortness of breath that comes and goes.  He denies any lightheadedness or dizziness today despite the low blood pressure readings.   The patient has a brain MRI and thoracic and lumbar spine scheduled for next month.  The patient is here today for evaluation prior to starting cycle # 3. Yervoy  was discontinued from the  treatment plan.   MEDICAL HISTORY: Past Medical History:  Diagnosis Date   History of chicken pox    History of shingles 04/2010   Lung cancer (HCC) 02/2020    ALLERGIES:  is allergic to acyclovir and related.  MEDICATIONS:  Current Outpatient Medications  Medication Sig Dispense Refill   alendronate  (FOSAMAX ) 70 MG tablet Take 1 tablet (70 mg total) by mouth every 7 (seven) days. Take with a full glass of water on an empty stomach. 12 tablet 4   Calcium Carbonate-Vit D-Min (CALTRATE 600+D PLUS MINERALS) 600-800 MG-UNIT TABS Take 1 tablet by mouth in the morning and at  bedtime.     lidocaine -prilocaine  (EMLA ) cream Apply to affected area once 30 g 3   LORazepam  (ATIVAN ) 0.5 MG tablet 1 tab po 30 minutes prior to radiation or MRI scans 10 tablet 0   methylPREDNISolone  (MEDROL  DOSEPAK) 4 MG TBPK tablet Use as instructed 21 tablet 0   Multiple Vitamin (MULTIVITAMIN) tablet Take 1 tablet by mouth daily.     ondansetron  (ZOFRAN ) 8 MG tablet Take 1 tablet (8 mg total) by mouth every 8 (eight) hours as needed for nausea or vomiting. 30 tablet 1   prochlorperazine  (COMPAZINE ) 10 MG tablet Take 1 tablet (10 mg total) by mouth every 6 (six) hours as needed for nausea or vomiting. 30 tablet 1   tamsulosin  (FLOMAX ) 0.4 MG CAPS capsule Take 1 capsule (0.4 mg total) by mouth daily. 90 capsule 1   Zoster Vaccine Adjuvanted (SHINGRIX ) injection 0.5ml IM now and again in 2-6 months 0.5 mL 1   No current facility-administered medications for this visit.    SURGICAL HISTORY:  Past Surgical History:  Procedure Laterality Date   BRONCHIAL BIOPSY  03/22/2020   Procedure: BRONCHIAL BIOPSIES;  Surgeon: Brenna Adine CROME, DO;  Location: [facility];  Service: Pulmonary;;   BRONCHIAL BRUSHINGS  03/22/2020   Procedure: BRONCHIAL BRUSHINGS;  Surgeon: Brenna Adine CROME, DO;  Location: [facility];  Service: Pulmonary;;   BRONCHIAL NEEDLE ASPIRATION BIOPSY  03/22/2020   Procedure: BRONCHIAL NEEDLE ASPIRATION BIOPSIES;  Surgeon: Brenna Adine CROME, DO;  Location: [facility];  Service: Pulmonary;;   BRONCHIAL WASHINGS  03/22/2020   Procedure: BRONCHIAL WASHINGS;  Surgeon: Brenna Adine CROME, DO;  Location: [facility];  Service: Pulmonary;;   FIDUCIAL MARKER PLACEMENT  03/22/2020   Procedure: FIDUCIAL MARKER PLACEMENT;  Surgeon: Brenna Adine CROME, DO;  Location: [facility];  Service: Pulmonary;;   IR IMAGING GUIDED PORT INSERTION  11/05/2023   IR LUMBAR PUNCTURE  11/05/2023   ROBOTIC ASSITED PARTIAL NEPHRECTOMY Left 2022   VIDEO BRONCHOSCOPY WITH ENDOBRONCHIAL NAVIGATION N/A  03/22/2020   Procedure: VIDEO BRONCHOSCOPY WITH ENDOBRONCHIAL NAVIGATION;  Surgeon: Brenna Adine CROME, DO;  Location: [facility];  Service: Pulmonary;  Laterality: N/A;   VIDEO BRONCHOSCOPY WITH ENDOBRONCHIAL ULTRASOUND N/A 03/22/2020   Procedure: VIDEO BRONCHOSCOPY WITH ENDOBRONCHIAL ULTRASOUND;  Surgeon: Brenna Adine CROME, DO;  Location: [facility];  Service: Pulmonary;  Laterality: N/A;   VIDEO BRONCHOSCOPY WITH ENDOBRONCHIAL ULTRASOUND Bilateral 11/10/2023   Procedure: BRONCHOSCOPY, WITH EBUS;  Surgeon: Shelah Lamar RAMAN, MD;  Location: [facility] ENDOSCOPY;  Service: Cardiopulmonary;  Laterality: Bilateral;    REVIEW OF SYSTEMS:   Review of Systems  Constitutional: Negative for appetite change, chills, fatigue, fever and unexpected weight change.  HENT:   Negative for mouth sores, nosebleeds, sore throat and trouble swallowing.   Eyes: Negative for eye problems and icterus.  Respiratory: Negative for cough, hemoptysis, shortness of breath and wheezing.   Cardiovascular:  Negative for chest pain and leg swelling.  Gastrointestinal: Negative for abdominal pain, constipation, diarrhea, nausea and vomiting.  Genitourinary: Negative for bladder incontinence, difficulty urinating, dysuria, frequency and hematuria.   Musculoskeletal: Negative for back pain, gait problem, neck pain and neck stiffness.  Skin: Negative for itching and rash.  Neurological: Negative for dizziness, extremity weakness, gait problem, headaches, light-headedness and seizures.  Hematological: Negative for adenopathy. Does not bruise/bleed easily.  Psychiatric/Behavioral: Negative for confusion, depression and sleep disturbance. The patient is not nervous/anxious.     PHYSICAL EXAMINATION:  There were no vitals taken for this visit.  ECOG PERFORMANCE STATUS: {CHL ONC ECOG [id number]  Physical Exam  Constitutional: Oriented to person, place, and time and well-developed, well-nourished, and in no distress. No distress.   HENT:  Head: Normocephalic and atraumatic.  Mouth/Throat: Oropharynx is clear and moist. No oropharyngeal exudate.  Eyes: Conjunctivae are normal. Right eye exhibits no discharge. Left eye exhibits no discharge. No scleral icterus.  Neck: Normal range of motion. Neck supple.  Cardiovascular: Normal rate, regular rhythm, normal heart sounds and intact distal pulses.   Pulmonary/Chest: Effort normal and breath sounds normal. No respiratory distress. No wheezes. No rales.  Abdominal: Soft. Bowel sounds are normal. Exhibits no distension and no mass. There is no tenderness.  Musculoskeletal: Normal range of motion. Exhibits no edema.  Lymphadenopathy:    No cervical adenopathy.  Neurological: Alert and oriented to person, place, and time. Exhibits normal muscle tone. Gait normal. Coordination normal.  Skin: Skin is warm and dry. No rash noted. Not diaphoretic. No erythema. No pallor.  Psychiatric: Mood, memory and judgment normal.  Vitals reviewed.  LABORATORY DATA: Lab Results  Component Value Date   WBC 6.6 01/07/2024   HGB 11.1 (L) 01/07/2024   HCT 32.5 (L) 01/07/2024   MCV 90.8 01/07/2024   PLT 300 01/07/2024      Chemistry      Component Value Date/Time   NA 133 (L) 01/07/2024 0951   K 4.1 01/07/2024 0951   CL 99 01/07/2024 0951   CO2 26 01/07/2024 0951   BUN 21 01/07/2024 0951   CREATININE 1.29 (H) 01/07/2024 0951   CREATININE 0.86 11/12/2011 0917      Component Value Date/Time   CALCIUM 9.1 01/07/2024 0951   ALKPHOS 104 01/07/2024 0951   AST 18 01/07/2024 0951   ALT 22 01/07/2024 0951   BILITOT 0.5 01/07/2024 0951       RADIOGRAPHIC STUDIES:  No results found.   ASSESSMENT/PLAN:   ASSESSMENT/PLAN:  This is a very pleasant 73 year old Caucasian male diagnosed with poorly differentiated carcinoma consistent with adenocarcinoma in the right upper lobe.  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  The patient also  has a suspicious left renal lesion concerning for renal cell carcinoma. He is status post a course of systemic chemotherapy initially with cisplatin  and etoposide  but the cisplatin  was discontinued secondary to renal insufficiency and the patient continued 3 more cycles of his systemic chemotherapy with carboplatin  and etoposide  concurrent with radiation.   He completed PCI under the care of Dr. Dewey 08/31/20.  He had evidence for disease recurrence with metastatic brain lesions in addition to right hilar lymphadenopathy treated with SRS as well as palliative radiotherapy to the right hilar area. 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  He had a PET scan performed recently that showed evidence for disease progression in the mediastinal lymph nodes as well as right adrenal metastasis. He also underwent radiation to the lumbar spine under the care of Dr. Dewey which was completed on 12/08/23.  He had bronchoscopy done recently by Dr. Shelah and the final pathology from the 2R and 4L lymph nodes showed malignant cells consistent with renal cell carcinoma. He is currently undergoing treatment with immunotherapy with ipilimumab  1 mg/KG in addition to nivolumab  360 mg IV every 3 weeks.  Status post 2 cycles.  His daurrhea has *** Yervoy  will be discontinued starting from cycle #3.  His creatinine is ***. His weight is ***. His pulse is ***. His BP is ***.    The patient was seen with Dr. Sherrod. Labs were reviewed. Recommend he *** with cycle #3 today as scheduled.   We will see him back for labs and follow up in 4 weeks before undergoing cycle #4.   ***GI pathogen and cdiff  The patient was advised to call immediately if he has any concerning symptoms in the interval. The patient voices understanding of current disease status and treatment options  and is in agreement with the current care plan. All questions were answered. The patient knows to call the clinic with any problems, questions or concerns. We can certainly see the patient much sooner if necessary      No orders of the defined types were placed in this encounter.    I spent {CHL ONC TIME VISIT - DTPQU:[id number]} counseling the patient face to face. The total time spent in the appointment was {CHL ONC TIME VISIT - DTPQU:[id number]}.  Errik Mitchelle L Jarita Raval, PA-C 01/15/24

## 2024-01-15 NOTE — Assessment & Plan Note (Addendum)
 01/15/24 Estimated body mass index is 13.89 kg/m as calculated from the following:   Height as of 12/16/23: 6' 1 (1.854 m).   Weight as of an earlier encounter on 01/15/24: 47.8 kg.

## 2024-01-16 ENCOUNTER — Encounter (HOSPITAL_COMMUNITY): Payer: Self-pay | Admitting: Internal Medicine

## 2024-01-16 DIAGNOSIS — J189 Pneumonia, unspecified organism: Secondary | ICD-10-CM | POA: Diagnosis not present

## 2024-01-16 LAB — CBC WITH DIFFERENTIAL/PLATELET
Abs Immature Granulocytes: 0.17 K/uL — ABNORMAL HIGH (ref 0.00–0.07)
Basophils Absolute: 0.1 K/uL (ref 0.0–0.1)
Basophils Relative: 1 %
Eosinophils Absolute: 0.4 K/uL (ref 0.0–0.5)
Eosinophils Relative: 5 %
HCT: 31.4 % — ABNORMAL LOW (ref 39.0–52.0)
Hemoglobin: 9.8 g/dL — ABNORMAL LOW (ref 13.0–17.0)
Immature Granulocytes: 2 %
Lymphocytes Relative: 8 %
Lymphs Abs: 0.7 K/uL (ref 0.7–4.0)
MCH: 29.8 pg (ref 26.0–34.0)
MCHC: 31.2 g/dL (ref 30.0–36.0)
MCV: 95.4 fL (ref 80.0–100.0)
Monocytes Absolute: 1 K/uL (ref 0.1–1.0)
Monocytes Relative: 12 %
Neutro Abs: 6.3 K/uL (ref 1.7–7.7)
Neutrophils Relative %: 72 %
Platelets: 308 K/uL (ref 150–400)
RBC: 3.29 MIL/uL — ABNORMAL LOW (ref 4.22–5.81)
RDW: 13.6 % (ref 11.5–15.5)
WBC: 8.7 K/uL (ref 4.0–10.5)
nRBC: 0 % (ref 0.0–0.2)

## 2024-01-16 LAB — COMPREHENSIVE METABOLIC PANEL WITH GFR
ALT: 19 U/L (ref 0–44)
AST: 15 U/L (ref 15–41)
Albumin: 3.2 g/dL — ABNORMAL LOW (ref 3.5–5.0)
Alkaline Phosphatase: 86 U/L (ref 38–126)
Anion gap: 12 (ref 5–15)
BUN: 20 mg/dL (ref 8–23)
CO2: 22 mmol/L (ref 22–32)
Calcium: 8.6 mg/dL — ABNORMAL LOW (ref 8.9–10.3)
Chloride: 101 mmol/L (ref 98–111)
Creatinine, Ser: 1.02 mg/dL (ref 0.61–1.24)
GFR, Estimated: >60 mL/min (ref >60)
Glucose, Bld: 62 mg/dL — ABNORMAL LOW (ref 70–99)
Potassium: 3.7 mmol/L (ref 3.5–5.1)
Sodium: 135 mmol/L (ref 135–145)
Total Bilirubin: 0.4 mg/dL (ref 0.0–1.2)
Total Protein: 5.4 g/dL — ABNORMAL LOW (ref 6.5–8.1)

## 2024-01-16 LAB — GLUCOSE, CAPILLARY
Glucose-Capillary: 109 mg/dL — ABNORMAL HIGH (ref 70–99)
Glucose-Capillary: 65 mg/dL — ABNORMAL LOW (ref 70–99)

## 2024-01-16 LAB — MAGNESIUM: Magnesium: 1.5 mg/dL — ABNORMAL LOW (ref 1.7–2.4)

## 2024-01-16 MED ORDER — SODIUM CHLORIDE 0.9 % IV BOLUS
500.0000 mL | Freq: Once | INTRAVENOUS | Status: AC
  Administered 2024-01-16: 500 mL via INTRAVENOUS

## 2024-01-16 MED ORDER — DEXTROSE 50 % IV SOLN
INTRAVENOUS | Status: AC
  Administered 2024-01-16: 50 mL
  Filled 2024-01-16: qty 50

## 2024-01-16 MED ORDER — MIDODRINE HCL 5 MG PO TABS
10.0000 mg | ORAL_TABLET | Freq: Three times a day (TID) | ORAL | Status: DC
  Administered 2024-01-16 – 2024-01-18 (×6): 10 mg via ORAL
  Filled 2024-01-16 (×7): qty 2

## 2024-01-16 MED ORDER — LOPERAMIDE HCL 2 MG PO CAPS
2.0000 mg | ORAL_CAPSULE | ORAL | Status: DC | PRN
  Administered 2024-01-16 (×2): 2 mg via ORAL
  Filled 2024-01-16 (×2): qty 1

## 2024-01-16 MED ORDER — MAGNESIUM SULFATE 2 GM/50ML IV SOLN
2.0000 g | Freq: Once | INTRAVENOUS | Status: AC
  Administered 2024-01-16: 2 g via INTRAVENOUS
  Filled 2024-01-16: qty 50

## 2024-01-16 NOTE — Progress Notes (Signed)
 PROGRESS NOTE Ian Hart  FMW:[id number] DOB: 25-Jul-1950 DOA: 01/15/2024 PCP: [name], NP  Brief Narrative/Hospital Course: 73 year old male history of metastatic renal cell carcinoma, history of lung cancer small cell, failure to thrive, BPH, presents to the ER from the heme-onc clinic.  Patient has been having diarrhea for the last 6 weeks ever since he received his first immunomodulator infusion in November 25, 2023.  He states he developed a rash and diarrhea since then.  He has been having 5-6 loose watery bowel movements every day.  Daily nausea with occasional vomiting.  He has had chills and cough He recently treated with a 6-day Medrol  Dosepak which he states that did help his abdominal pain but did nothing for his diarrhea.  He was seen in clinic,noted to be hypotensive and sent to the ER for evaluation. On arrival temp 98 heart rate 106 blood pressure 96/53 satting 97% on room air Labs showed hypomagnesemia and leukocytosis 12.7  BUN 27, Scr 1.4  alb 3.4, AST 12, ALT 21, alk phos 96 Recent C. Diff Neg antigen, negative toxin CT abd/pelvis>>No acute findings in the abdomen/pelvis.Airspace process over the left lower lobe likely a pneumonia. 3. Minimal colonic diverticulosis 4. Subcentimeter hypodensity over the upper pole right kidney too small to characterize, but likely a hyperdense cyst. 5. Aortic atherosclerosis. Patient was placed on antibiotics and admitted for pneumonia/sepsis.  Subjective: Seen and examined today He feels better, he still having loose bowel movement x 2 already Overnight afebrile BP soft 90s Labs glucose 65> improved to 109 WBC normalized hemoglobin 9.8 BP has been running about 100 days today  Assessment and plan:   Sepsis POA CAP: Patient with soft blood pressure leukocytosis CT abdomen pelvis with left lower lobe pneumonia.  Continue Cipro azithromycin follow-up urine antigens and blood culture. Recent Labs  Lab 01/15/24 1140 01/15/24 1901  01/16/24 0446  WBC 12.8*  --  8.7  LATICACIDVEN  --  0.7  --      Chronic diarrhea: After immunotherapy use on November 25, 2023(nivolumab  (OPDIVO ), ipilimumab  (YERVOY ) ).  Some relief from abdominal pain following Medrol  Dosepak Oncology following.  Add Imodium   AKI Resolved.  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  DVT prophylaxis: heparin  injection 5,000 Units Start: 01/15/24 2200 SCDs Start: 01/15/24 1835 Code Status:   Code Status: Full Code Family Communication: plan of care discussed with patient at bedside. Patient status is: Remains hospitalized because of severity of illness Level of care: Stepdown > transfer to telemetry If remains stable today  Dispo: The patient is from: home            Anticipated disposition: TBD Objective: Vitals last 24 hrs: Vitals:   01/16/24 0600 01/16/24 0700 01/16/24 0800 01/16/24 0925  BP: (!) 102/43 (!) 120/54    Pulse: 84 79 86   Resp: 18 (!) 23 (!) 21   Temp:    97.9 F (36.6 C)  TempSrc:    Oral  SpO2: 99% 98% 99%   Weight:      Height:        Physical Examination: General exam: alert awake, thin  frail cachectic  HEENT:Oral mucosa moist, Ear/Nose WNL grossly Respiratory system: Bilaterally clear BS,no use of accessory muscle Cardiovascular system: S1 & S2 +, No JVD. Gastrointestinal system: Abdomen soft,NT,ND, BS+ Nervous System: Alert, awake, moving all extremities,and following commands. Extremities: extremities warm, leg edema neg Skin: No rashes,no icterus. MSK: Normal muscle bulk,tone, power   Medications reviewed:   Scheduled Meds:  benzonatate  100 mg Oral BID   Chlorhexidine  Gluconate Cloth  6 each Topical Daily   heparin   5,000 Units Subcutaneous Q8H   Influenza vac split trivalent PF  0.5 mL Intramuscular Tomorrow-1000   sodium chloride  flush  10-40 mL Intracatheter Q12H   tamsulosin   0.4 mg Oral Daily   Continuous Infusions:  azithromycin Stopped (01/15/24 2122)   cefTRIAXone (ROCEPHIN)  IV Stopped (01/15/24 1858)   lactated ringers  125 mL/hr at 01/16/24 9342   Diet: Diet Order             Diet regular Room service appropriate? Yes; Fluid consistency: Thin  Diet effective now                    Data Reviewed: I have personally reviewed following labs and imaging studies ( see epic result tab) CBC: Recent Labs  Lab 01/15/24 1140 01/16/24 0446  WBC 12.8* 8.7  NEUTROABS 10.6* 6.3  HGB 11.0* 9.8*  HCT 33.1* 31.4*  MCV 91.7 95.4  PLT 371 308   CMP: Recent Labs  Lab 01/15/24 1140 01/16/24 0446  NA 132* 135  K 4.0 3.7  CL 99 101  CO2 27 22  GLUCOSE 116* 62*  BUN 27* 20  CREATININE 1.42* 1.02  CALCIUM 9.6 8.6*  MG 1.1* 1.5*   GFR: Estimated Creatinine Clearance: 45.2 mL/min (by C-G formula based on SCr of 1.02 mg/dL). Recent Labs  Lab 01/15/24 1140 01/16/24 0446  AST 12* 15  ALT 21 19  ALKPHOS 96 86  BILITOT 0.4 0.4  PROT 6.7 5.4*  ALBUMIN 3.5 3.2*   No results for input(s): LIPASE, AMYLASE in the last 168 hours. No results for input(s): AMMONIA in the last 168 hours. Coagulation Profile: No results for input(s): INR, PROTIME in the last 168 hours. Unresulted Labs (From admission, onward)     Start     Ordered   01/17/24 0500  Basic metabolic panel with GFR  Daily,   R      01/16/24 0726   01/17/24 0500  CBC  Daily,   R      01/16/24 0726   01/15/24 1835  Legionella Pneumophila Serogp 1 Ur Ag  (COPD / Pneumonia / Cellulitis / Lower Extremity Wound (Diabetic Foot Infection))  Once,   R        01/15/24 1834   01/15/24 1835  Strep pneumoniae urinary  antigen  (COPD / Pneumonia / Cellulitis / Lower Extremity Wound (Diabetic Foot Infection))  Once,   R        01/15/24 1834           Antimicrobials/Microbiology: Anti-infectives (From admission, onward)    Start     Dose/Rate Route Frequency Ordered Stop   01/15/24 1645  azithromycin (ZITHROMAX) 500 mg in sodium chloride  0.9 % 250 mL IVPB        500 mg 250 mL/hr over 60 Minutes Intravenous Every 24 hours 01/15/24 1638     01/15/24 1645  cefTRIAXone (ROCEPHIN) 1 g in sodium chloride  0.9 % 100 mL IVPB        1 g 200 mL/hr  over 30 Minutes Intravenous Every 24 hours 01/15/24 1638           Component Value Date/Time   SDES  01/15/2024 1935    BLOOD RIGHT ARM Performed at [facility], [address] [place], [place] [address]    SPECREQUEST  01/15/2024 1935    BOTTLES DRAWN AEROBIC AND ANAEROBIC Blood Culture adequate volume Performed at [facility], [address] [place], [place] [address]    CULT  01/15/2024 1935    NO GROWTH < 12 HOURS Performed at [facility], [address], [place], [place] [address]    REPTSTATUS PENDING 01/15/2024 1935    Procedures:    Mennie LAMY, MD [facility] 01/16/2024, 10:16 AM

## 2024-01-16 NOTE — Plan of Care (Signed)
  Problem: Education: Goal: Knowledge of General Education information will improve Description: Including pain rating scale, medication(s)/side effects and non-pharmacologic comfort measures Outcome: Progressing   Problem: Clinical Measurements: Goal: Ability to maintain clinical measurements within normal limits will improve Outcome: Progressing Goal: Respiratory complications will improve Outcome: Progressing   Problem: Activity: Goal: Risk for activity intolerance will decrease Outcome: Progressing   Problem: Nutrition: Goal: Adequate nutrition will be maintained Outcome: Progressing   Problem: Coping: Goal: Level of anxiety will decrease Outcome: Progressing

## 2024-01-17 ENCOUNTER — Encounter (HOSPITAL_COMMUNITY): Payer: Self-pay | Admitting: Internal Medicine

## 2024-01-17 DIAGNOSIS — J189 Pneumonia, unspecified organism: Secondary | ICD-10-CM | POA: Diagnosis not present

## 2024-01-17 LAB — BASIC METABOLIC PANEL WITH GFR
Anion gap: 8 (ref 5–15)
BUN: 15 mg/dL (ref 8–23)
CO2: 23 mmol/L (ref 22–32)
Calcium: 7.6 mg/dL — ABNORMAL LOW (ref 8.9–10.3)
Chloride: 105 mmol/L (ref 98–111)
Creatinine, Ser: 0.89 mg/dL (ref 0.61–1.24)
GFR, Estimated: >60 mL/min (ref >60)
Glucose, Bld: 78 mg/dL (ref 70–99)
Potassium: 3.3 mmol/L — ABNORMAL LOW (ref 3.5–5.1)
Sodium: 136 mmol/L (ref 135–145)

## 2024-01-17 LAB — CBC
HCT: 24.3 % — ABNORMAL LOW (ref 39.0–52.0)
Hemoglobin: 7.8 g/dL — ABNORMAL LOW (ref 13.0–17.0)
MCH: 31.3 pg (ref 26.0–34.0)
MCHC: 32.1 g/dL (ref 30.0–36.0)
MCV: 97.6 fL (ref 80.0–100.0)
Platelets: 258 K/uL (ref 150–400)
RBC: 2.49 MIL/uL — ABNORMAL LOW (ref 4.22–5.81)
RDW: 13.9 % (ref 11.5–15.5)
WBC: 6.3 K/uL (ref 4.0–10.5)
nRBC: 0 % (ref 0.0–0.2)

## 2024-01-17 LAB — OCCULT BLOOD X 1 CARD TO LAB, STOOL: Fecal Occult Bld: POSITIVE — AB

## 2024-01-17 MED ORDER — ADULT MULTIVITAMIN W/MINERALS CH
1.0000 | ORAL_TABLET | Freq: Every day | ORAL | Status: DC
  Administered 2024-01-17 – 2024-01-18 (×2): 1 via ORAL
  Filled 2024-01-17 (×2): qty 1

## 2024-01-17 MED ORDER — POTASSIUM CHLORIDE CRYS ER 20 MEQ PO TBCR
40.0000 meq | EXTENDED_RELEASE_TABLET | Freq: Once | ORAL | Status: AC
  Administered 2024-01-17: 40 meq via ORAL
  Filled 2024-01-17: qty 2

## 2024-01-17 MED ORDER — ENSURE PLUS HIGH PROTEIN PO LIQD
237.0000 mL | Freq: Two times a day (BID) | ORAL | Status: DC
  Administered 2024-01-17: 237 mL via ORAL

## 2024-01-17 NOTE — Discharge Instructions (Signed)

## 2024-01-17 NOTE — Progress Notes (Signed)
 Resident states he is having chronic diarrhea and does not want the bed alarm on because he needs to be able to get up quick and does not want the alarm going off. Importance of safety reiterated to patient with wife and son present. Patient still states he feels comfortable with transferring from the bed to commode himself and wants the bed alarm off.

## 2024-01-17 NOTE — Plan of Care (Signed)

## 2024-01-17 NOTE — Plan of Care (Signed)

## 2024-01-17 NOTE — Plan of Care (Signed)
   Problem: Education: Goal: Knowledge of General Education information will improve Description: Including pain rating scale, medication(s)/side effects and non-pharmacologic comfort measures Outcome: Progressing   Problem: Activity: Goal: Risk for activity intolerance will decrease Outcome: Progressing   Problem: Coping: Goal: Level of anxiety will decrease Outcome: Progressing

## 2024-01-17 NOTE — Progress Notes (Signed)
 PROGRESS NOTE Ian Hart  FMW:[id number] DOB: December 23, 1950 DOA: 01/15/2024 PCP: [name], NP  Brief Narrative/Hospital Course: 73 year old male history of metastatic renal cell carcinoma, history of lung cancer small cell, failure to thrive, BPH, presents to the ER from the heme-onc clinic.  Patient has been having diarrhea for the last 6 weeks ever since he received his first immunomodulator infusion in November 25, 2023.  He states he developed a rash and diarrhea since then.  He has been having 5-6 loose watery bowel movements every day.  Daily nausea with occasional vomiting.  He has had chills and cough He recently treated with a 6-day Medrol  Dosepak which he states that did help his abdominal pain but did nothing for his diarrhea.  He was seen in clinic,noted to be hypotensive and sent to the ER for evaluation. On arrival temp 98 heart rate 106 blood pressure 96/53 satting 97% on room air Labs showed hypomagnesemia and leukocytosis 12.7  BUN 27, Scr 1.4  alb 3.4, AST 12, ALT 21, alk phos 96 Recent C. Diff Neg antigen, negative toxin CT abd/pelvis>>No acute findings in the abdomen/pelvis.Airspace process over the left lower lobe likely a pneumonia. 3. Minimal colonic diverticulosis 4. Subcentimeter hypodensity over the upper pole right kidney too small to characterize, but likely a hyperdense cyst. 5. Aortic atherosclerosis. Patient was placed on antibiotics and admitted for pneumonia/sepsis.  Subjective: Seen and examined Overnight has been afebrile BP largely stable in 100-1 20s systolic Labs with mild hypokalemia hemoglobin has dropped to 7.8 g On bedside chair no new complaints, Some blackish stool  Assessment and plan:   Sepsis POA LLL CAP: Patient with soft blood pressure leukocytosis CT abdomen pelvis with left lower lobe pneumonia. Blood culture no growth to date.  Urine antigens pending.  Overall clinically improving WBC normalized. BP relatively stable on midodrine.  Continue Rocephin and azithromycin     Chronic diarrhea: After immunotherapy use on November 25, 2023(nivolumab  (OPDIVO ), ipilimumab  (YERVOY ) ). Some relief from abdominal pain following Medrol  Dosepak.  4 BM yesterday. Continue Imodium, oncology was notified on admission    AKI Resolved.   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  Hemoglobin downtrending. Check anemia panel, Hemoccult test. He had prbc transfusions previously. Recent Labs  Lab 01/15/24 1140 01/16/24 0446 01/17/24 0439  HGB 11.0* 9.8* 7.8*  HCT 33.1* 31.4* 24.3*    Severe malnutrition Body mass index is 14.4 kg/m.: Augment diet.  DVT prophylaxis: heparin  injection 5,000 Units Start: 01/15/24 2200 SCDs Start: 01/15/24 1835 Code Status:   Code Status: Full Code Family Communication: plan of care discussed with patient at bedside. Patient status is: Remains hospitalized because of severity of illness Level of care: Telemetry > transfer to telemetry If remains stable  Dispo: The patient is from: home            Anticipated disposition: TBD Objective: Vitals last 24 hrs: Vitals:   01/17/24 0400 01/17/24 0500 01/17/24 0600 01/17/24 0700  BP: (!) 116/47 (!) 117/46 (!) 121/46 (!) 125/50  Pulse: 64 65 67 71  Resp: 18 19 15  (!) 23  Temp: 97.7 F (36.5 C)     TempSrc: Oral     SpO2: 97% 99% 97% 100%  Weight:      Height:        Physical Examination: General exam:  AAOX3, thin frail. HEENT:Oral mucosa moist, Ear/Nose WNL grossly Respiratory system: CTA,no use of accessory muscle. Cardiovascular system: S1 & S2 +,No,JVD. Gastrointestinal system: Abdomen soft,NT,ND,BS+ Nervous System: Alert, awake, moving all extremities,and following commands. Extremities: extremities warm, leg edema neg. Skin: No  rashes,no icterus. MSK: Normal muscle bulk,tone, power.   Medications reviewed:  Scheduled Meds:  benzonatate  100 mg Oral BID   Chlorhexidine  Gluconate Cloth  6 each Topical Daily   heparin   5,000 Units Subcutaneous Q8H   midodrine  10 mg Oral TID WC   potassium chloride   40 mEq Oral Once   sodium chloride  flush  10-40 mL Intracatheter Q12H   tamsulosin   0.4 mg Oral Daily   Continuous Infusions:  azithromycin Stopped (01/16/24 1824)   cefTRIAXone (ROCEPHIN)  IV Stopped (01/16/24 1720)   Diet: Diet Order             Diet regular Room service appropriate? Yes; Fluid consistency: Thin  Diet effective now                    Data Reviewed: I have personally reviewed following labs and imaging studies ( see epic result tab) CBC: Recent Labs  Lab 01/15/24 1140 01/16/24 0446 01/17/24 0439  WBC 12.8* 8.7 6.3  NEUTROABS 10.6* 6.3  --   HGB 11.0* 9.8* 7.8*  HCT 33.1* 31.4* 24.3*  MCV 91.7 95.4 97.6  PLT 371 308 258   CMP: Recent Labs  Lab 01/15/24 1140 01/16/24 0446 01/17/24 0439  NA 132* 135 136  K 4.0 3.7 3.3*  CL 99 101 105  CO2 27 22 23   GLUCOSE 116* 62* 78  BUN 27* 20 15  CREATININE 1.42* 1.02 0.89  CALCIUM 9.6 8.6* 7.6*  MG 1.1* 1.5*  --    GFR: Estimated Creatinine Clearance: 51.8 mL/min (by C-G formula based on SCr of 0.89 mg/dL). Recent Labs  Lab 01/15/24 1140 01/16/24 0446  AST 12* 15  ALT 21 19  ALKPHOS 96 86  BILITOT 0.4 0.4  PROT 6.7 5.4*  ALBUMIN 3.5 3.2*   No results for input(s): LIPASE, AMYLASE in the last 168 hours. No results for input(s): AMMONIA in the last 168 hours. Coagulation Profile: No results for input(s): INR, PROTIME in the last 168 hours. Unresulted Labs (From admission, onward)     Start     Ordered   01/17/24 9275  Vitamin B12  (Anemia Panel (PNL))  Once,   R       Question:  Specimen collection method  Answer:  Lab=Lab collect   01/17/24 0723   01/17/24 0724  Folate  (Anemia Panel (PNL))  Once,   R        Question:  Specimen collection method  Answer:  Lab=Lab collect   01/17/24 0723   01/17/24 0724  Iron and TIBC  (Anemia Panel (PNL))  Once,   R       Question:  Specimen collection method  Answer:  Lab=Lab collect   01/17/24 0723   01/17/24 0724  Ferritin  (Anemia Panel (PNL))  Once,   R       Question:  Specimen collection method  Answer:  Lab=Lab collect   01/17/24 0723   01/17/24 0724  Reticulocytes  (Anemia Panel (PNL))  Once,   R       Question:  Specimen collection method  Answer:  Lab=Lab collect   01/17/24 0723   01/17/24 0724  Occult blood card to lab, stool  Once,   R  01/17/24 0723   01/17/24 0500  Basic metabolic panel with GFR  Daily,   R      01/16/24 0726   01/17/24 0500  CBC  Daily,   R      01/16/24 0726   01/15/24 1835  Legionella Pneumophila Serogp 1 Ur Ag  (COPD / Pneumonia / Cellulitis / Lower Extremity Wound (Diabetic Foot Infection))  Once,   R        01/15/24 1834   01/15/24 1835  Strep pneumoniae urinary antigen  (COPD / Pneumonia / Cellulitis / Lower Extremity Wound (Diabetic Foot Infection))  Once,   R        01/15/24 1834           Antimicrobials/Microbiology: Anti-infectives (From admission, onward)    Start     Dose/Rate Route Frequency Ordered Stop   01/15/24 1645  azithromycin (ZITHROMAX) 500 mg in sodium chloride  0.9 % 250 mL IVPB        500 mg 250 mL/hr over 60 Minutes Intravenous Every 24 hours 01/15/24 1638     01/15/24 1645  cefTRIAXone (ROCEPHIN) 1 g in sodium chloride  0.9 % 100 mL IVPB        1 g 200 mL/hr over 30 Minutes Intravenous Every 24 hours 01/15/24 1638           Component Value Date/Time   SDES  01/15/2024 1935    BLOOD RIGHT ARM Performed at [facility], [address] [place], [place] [address]    SPECREQUEST  01/15/2024 1935    BOTTLES DRAWN AEROBIC AND ANAEROBIC Blood Culture adequate volume Performed at [facility], [address] [place], [place] [address]    CULT  01/15/2024  1935    NO GROWTH 2 DAYS Performed at [facility], [address], [place], [place] [address]    REPTSTATUS PENDING 01/15/2024 1935    Procedures:    Mennie LAMY, MD [facility] 01/17/2024, 9:41 AM

## 2024-01-17 NOTE — Progress Notes (Addendum)
 Initial Nutrition Assessment  DOCUMENTATION CODES:  Underweight  INTERVENTION:  Multivitamin w/ minerals daily Ensure Plus High Protein po BID, each supplement provides 350 kcal and 20 grams of protein Magic cup BID with meals, each supplement provides 290 kcal and 9 grams of protein High Calorie, High Protein Nutrition Therapy handout added to AVS Encourage good PO intake Calorie Count  NUTRITION DIAGNOSIS:  Increased nutrient needs related to cancer and cancer related treatments as evidenced by estimated needs.  GOAL:  Patient will meet greater than or equal to 90% of their needs  MONITOR:  PO intake, Supplement acceptance, Labs, Weight trends  REASON FOR ASSESSMENT:  Consult Assessment of nutrition requirement/status, Calorie Count  ASSESSMENT:  72 y.o. male presented to the ED with hypotension from heme/onc clinic. PMH includes metastatic renal cell carcinoma, lung cancer, BPH, and chronic diarrhea. Pt admitted with community acquired pneumonia, AKI, and sepsis.   10/3 - Admitted   RD working remotely at time of assessment. Discussed with RN, reports that pt completed his breakfast plate this morning. RN going to encourage pt to consume oral nutrition supplements. No complaints of nausea today per RN. RD to add nutrition education for high calorie, high protein to discharge instructions to help with weight gain at home. Reached out to MD, plan to conduct calorie count to assess pt PO intake.  Per EMR, pt with a weight loss of 14.8% weight loss within the past three years; this is clinically significant for timeframe. Suspect pt meets criteria for malnutrition but unable to diagnosis at this time due to RD working remotely and unable to complete physical exam.  Admission/Current Weight: 49.5 kg  Nutrition Related Medications: IV antibiotics  Labs: Sodium 136, Potassium 3.3, BUN 15, Creatinine 0.89, Magnesium  1.5   NUTRITION - FOCUSED PHYSICAL EXAM: Deferred to follow-up.    Diet Order:   Diet Order             Diet regular Room service appropriate? Yes; Fluid consistency: Thin  Diet effective now                  EDUCATION NEEDS:  Education needs have been addressed  Skin:  Skin Assessment: Reviewed RN Assessment  Last BM:  10/5 - Type 7 (small)  Height:  Ht Readings from Last 1 Encounters:  01/15/24 6' 1 (1.854 m)   Weight:  Wt Readings from Last 1 Encounters:  01/15/24 49.5 kg   Ideal Body Weight:  83.6 kg  BMI:  Body mass index is 14.4 kg/m.  Estimated Nutritional Needs:  Kcal:  1600-1800 Protein:  80-100 grams Fluid:  >/= 1.6 L   Nestora Glatter RD, LDN Clinical Dietitian

## 2024-01-18 DIAGNOSIS — K921 Melena: Secondary | ICD-10-CM

## 2024-01-18 DIAGNOSIS — K529 Noninfective gastroenteritis and colitis, unspecified: Secondary | ICD-10-CM | POA: Diagnosis not present

## 2024-01-18 DIAGNOSIS — J189 Pneumonia, unspecified organism: Secondary | ICD-10-CM | POA: Diagnosis not present

## 2024-01-18 DIAGNOSIS — R195 Other fecal abnormalities: Secondary | ICD-10-CM

## 2024-01-18 DIAGNOSIS — R197 Diarrhea, unspecified: Secondary | ICD-10-CM

## 2024-01-18 DIAGNOSIS — D62 Acute posthemorrhagic anemia: Secondary | ICD-10-CM

## 2024-01-18 LAB — RETICULOCYTES
Immature Retic Fract: 7.1 % (ref 2.3–15.9)
RBC.: 2.54 MIL/uL — ABNORMAL LOW (ref 4.22–5.81)
Retic Count, Absolute: 33.3 K/uL (ref 19.0–186.0)
Retic Ct Pct: 1.3 % (ref 0.4–3.1)

## 2024-01-18 LAB — BASIC METABOLIC PANEL WITH GFR
Anion gap: 6 (ref 5–15)
BUN: 12 mg/dL (ref 8–23)
CO2: 23 mmol/L (ref 22–32)
Calcium: 7.7 mg/dL — ABNORMAL LOW (ref 8.9–10.3)
Chloride: 106 mmol/L (ref 98–111)
Creatinine, Ser: 0.86 mg/dL (ref 0.61–1.24)
GFR, Estimated: >60 mL/min (ref >60)
Glucose, Bld: 86 mg/dL (ref 70–99)
Potassium: 3.9 mmol/L (ref 3.5–5.1)
Sodium: 135 mmol/L (ref 135–145)

## 2024-01-18 LAB — CBC
HCT: 24.1 % — ABNORMAL LOW (ref 39.0–52.0)
Hemoglobin: 7.9 g/dL — ABNORMAL LOW (ref 13.0–17.0)
MCH: 31.3 pg (ref 26.0–34.0)
MCHC: 32.8 g/dL (ref 30.0–36.0)
MCV: 95.6 fL (ref 80.0–100.0)
Platelets: 279 K/uL (ref 150–400)
RBC: 2.52 MIL/uL — ABNORMAL LOW (ref 4.22–5.81)
RDW: 13.8 % (ref 11.5–15.5)
WBC: 7 K/uL (ref 4.0–10.5)
nRBC: 0 % (ref 0.0–0.2)

## 2024-01-18 LAB — VITAMIN B12: Vitamin B-12: 707 pg/mL (ref 180–914)

## 2024-01-18 LAB — IRON AND TIBC
Iron: 48 ug/dL (ref 45–182)
Saturation Ratios: 26 % (ref 17.9–39.5)
TIBC: 185 ug/dL — ABNORMAL LOW (ref 250–450)
UIBC: 137 ug/dL

## 2024-01-18 LAB — FERRITIN: Ferritin: 600 ng/mL — ABNORMAL HIGH (ref 24–336)

## 2024-01-18 LAB — FOLATE: Folate: 12.4 ng/mL (ref >5.9)

## 2024-01-18 MED ORDER — AZITHROMYCIN 250 MG PO TABS
500.0000 mg | ORAL_TABLET | Freq: Every day | ORAL | Status: AC
  Administered 2024-01-18 – 2024-01-19 (×2): 500 mg via ORAL
  Filled 2024-01-18 (×2): qty 2

## 2024-01-18 MED ORDER — SODIUM CHLORIDE 0.9 % IV SOLN: INTRAVENOUS | Status: AC

## 2024-01-18 MED ORDER — MIDODRINE HCL 5 MG PO TABS
5.0000 mg | ORAL_TABLET | Freq: Three times a day (TID) | ORAL | Status: DC
  Administered 2024-01-18 (×2): 5 mg via ORAL
  Filled 2024-01-18 (×2): qty 1

## 2024-01-18 MED ORDER — HYDROCORTISONE 1 % EX CREA
1.0000 | TOPICAL_CREAM | Freq: Three times a day (TID) | CUTANEOUS | Status: DC | PRN
  Filled 2024-01-18: qty 28

## 2024-01-18 MED ORDER — PANTOPRAZOLE SODIUM 40 MG PO TBEC
40.0000 mg | DELAYED_RELEASE_TABLET | Freq: Two times a day (BID) | ORAL | Status: DC
  Administered 2024-01-18 (×2): 40 mg via ORAL
  Filled 2024-01-18 (×2): qty 1

## 2024-01-18 NOTE — Addendum Note (Signed)
 Encounter addended by: Albino Dyke FALCON, LPN on: 89/06/7972 12:49 PM  Actions taken: Clinical Note Signed

## 2024-01-18 NOTE — Plan of Care (Signed)
  Problem: Education: Goal: Knowledge of General Education information will improve Description: Including pain rating scale, medication(s)/side effects and non-pharmacologic comfort measures Outcome: Progressing   Problem: Health Behavior/Discharge Planning: Goal: Ability to manage health-related needs will improve Outcome: Progressing   Problem: Clinical Measurements: Goal: Ability to maintain clinical measurements within normal limits will improve Outcome: Progressing   Problem: Activity: Goal: Risk for activity intolerance will decrease Outcome: Progressing   Problem: Nutrition: Goal: Adequate nutrition will be maintained Outcome: Progressing   Problem: Elimination: Goal: Will not experience complications related to urinary retention Outcome: Progressing   Problem: Pain Managment: Goal: General experience of comfort will improve and/or be controlled Outcome: Progressing   Problem: Safety: Goal: Ability to remain free from injury will improve Outcome: Progressing   Problem: Clinical Measurements: Goal: Ability to maintain a body temperature in the normal range will improve Outcome: Progressing

## 2024-01-18 NOTE — Progress Notes (Signed)
 Ian Hart   DOB:1950-04-23   FM#:[id number]      ASSESSMENT & PLAN:  Ian Hart.  Ian Hart is a 73 year old male patient with oncologic history significant for RCC and has been on immunotherapy.  He was admitted on 01/15/2024 with complaints of hypotension and diarrhea of several weeks post immunotherapy.  Medical oncology following.  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 - Currently on immunotherapy with ipilimumab  + Nivolumab , started 11/25/2023. - Patient reports that he is scheduled for continuation of his immunotherapy next week.  Will likely be delayed which patient is very happy about. - Medical oncology/Dr. Sherrod following closely  Immunotherapy induced diarrhea - Improving -Secondary to oncologic therapy - Patient states that he took prednisone for several days and felt that it was not helping.  He thinks that the antibiotics are helping him more than the steroids were. - Continue Imodium and restart high-dose steroids if needed. - Continue to monitor closely  Anemia, normocytic - Hemoglobin 7.9 today - Significant drop from baseline of 10-11 - FOBT done 10/5 is positive.  Recommend GI evaluation. - Iron studies pending - Recommend PRBC transfusion for hemoglobin <7.0 - Continue to monitor CBC with differential     Code Status Full   Subjective:  Patient seen awake and alert sitting up in bed.  Patient's wife is at bedside.  Reports ongoing pain in his left groin.  States that diarrhea is going away, had 2 small bowel movements today that he feels are solidifying.  Patient was pleased that immunotherapy may be delayed.  Objective:   Intake/Output Summary (Last 24 hours) at 01/18/2024 1033 Last data filed at 01/18/2024 0936 Gross  per 24 hour  Intake 360 ml  Output --  Net 360 ml     PHYSICAL EXAMINATION: ECOG PERFORMANCE STATUS: 3 - Symptomatic, >50% confined to bed  Vitals:   01/18/24 0253 01/18/24 0615  BP: (!) 112/57 (!) 103/52  Pulse: 69 67  Resp: 20 18  Temp: 98 F (36.7 C) 98 F (36.7 C)  SpO2: 97% 98%   Filed Weights   01/15/24 1935  Weight: 109 lb 2 oz (49.5 kg)    GENERAL: alert, no distress and comfortable +chronically ill-appearing SKIN: skin color, texture, turgor are normal, no rashes or significant lesions EYES: normal, conjunctiva are pink and non-injected, sclera clear OROPHARYNX: no exudate, no erythema and lips, buccal mucosa, and tongue normal  NECK: supple, thyroid  normal size, non-tender, without nodularity LYMPH: no palpable lymphadenopathy in the cervical, axillary or inguinal LUNGS: clear to auscultation and percussion with normal breathing effort HEART: regular rate & rhythm and no murmurs and no lower extremity edema ABDOMEN: abdomen soft, non-tender and normal bowel sounds MUSCULOSKELETAL: no cyanosis of digits and no clubbing  PSYCH: alert & oriented x 3 with fluent speech NEURO: no focal motor/sensory deficits   All questions were answered. The patient knows to call the clinic with any problems, questions or concerns.   The total time spent in the appointment was 40 minutes encounter with patient including review of chart and various tests results, discussions about plan of care and coordination of care plan  Olam JINNY Brunner, NP 01/18/2024 10:33 AM    Labs Reviewed:  Lab Results  Component Value Date   WBC 7.0 01/18/2024   HGB 7.9 (L)  01/18/2024   HCT 24.1 (L) 01/18/2024   MCV 95.6 01/18/2024   PLT 279 01/18/2024   Recent Labs    01/07/24 0951 01/15/24 1140 01/16/24 0446 01/17/24 0439 01/18/24 0441  NA 133* 132* 135 136 135  K 4.1 4.0 3.7 3.3* 3.9  CL 99 99 101 105 106  CO2 26 27 22 23 23   GLUCOSE 109* 116* 62* 78 86  BUN 21 27* 20 15 12    CREATININE 1.29* 1.42* 1.02 0.89 0.86  CALCIUM 9.1 9.6 8.6* 7.6* 7.7*  GFRNONAA 59* 52* >60 >60 >60  PROT 6.8 6.7 5.4*  --   --   ALBUMIN 3.6 3.5 3.2*  --   --   AST 18 12* 15  --   --   ALT 22 21 19   --   --   ALKPHOS 104 96 86  --   --   BILITOT 0.5 0.4 0.4  --   --     Studies Reviewed:  CT ABDOMEN PELVIS WO CONTRAST Result Date: 01/15/2024 CLINICAL DATA:  Abdominal pain and diarrhea 6 days. History of lung cancer. EXAM: CT ABDOMEN AND PELVIS WITHOUT CONTRAST TECHNIQUE: Multidetector CT imaging of the abdomen and pelvis was performed following the standard protocol without IV contrast. RADIATION DOSE REDUCTION: This exam was performed according to the departmental dose-optimization program which includes automated exposure control, adjustment of the mA and/or kV according to patient size and/or use of iterative reconstruction technique. COMPARISON:  PET-CT 10/22/2023 FINDINGS: Lower chest: Heart is normal size. Calcification over the mitral valve annulus. Central venous catheter tip over the cavoatrial junction. Minimal calcified plaque over the descending thoracic aorta. Lung bases demonstrate airspace process over the left lower lobe likely infection. No effusion. Hepatobiliary: Liver and biliary tree are unchanged with small 1 cm hypodensity over the right lobe likely a cyst. Gallbladder not well visualized. Pancreas: Normal. Spleen: Normal. Adrenals/Urinary Tract: Adrenal glands are normal. Kidneys are normal in size without hydronephrosis or nephrolithiasis. Hyperdense subcentimeter upper pole right renal cortical focus likely hemorrhagic cyst as Hounsfield unit measurements are 64. Ureters are unremarkable without evidence of stones. Bladder is mildly distended but otherwise unremarkable. Stomach/Bowel: Stomach and small bowel are within normal. Appendix is normal. Minimal diverticulosis of the colon which is otherwise unremarkable. Vascular/Lymphatic: Mild calcified plaque over the abdominal  aorta which is otherwise normal in caliber. Remaining vascular structures are unremarkable on this noncontrast exam. No definite adenopathy. Reproductive: Prostate is unremarkable. Other: No definite free fluid or focal inflammatory change. Musculoskeletal: Degenerative changes of the spine. No focal abnormality. IMPRESSION: 1. No acute findings in the abdomen/pelvis. 2. Airspace process over the left lower lobe likely a pneumonia. 3. Minimal colonic diverticulosis. 4. Subcentimeter hypodensity over the upper pole right kidney too small to characterize, but likely a hyperdense cyst. 5. Aortic atherosclerosis. Aortic Atherosclerosis (ICD10-I70.0). Electronically Signed   By: Toribio Agreste M.D.   On: 01/15/2024 16:34

## 2024-01-18 NOTE — Progress Notes (Addendum)
 Nutrition Follow-up  DOCUMENTATION CODES:   Severe malnutrition in context of chronic illness, Underweight  INTERVENTION:   -Ensure Plus High Protein po BID, each supplement provides 350 kcal and 20 grams of protein. Pt's wife to bring in strawberry flavor from home.  -Magic cup BID with meals, each supplement provides 290 kcal and 9 grams of protein   -Multivitamin with minerals daily  -High Calorie, High Protein Nutrition Therapy handout added to AVS   -Calorie count completed  NUTRITION DIAGNOSIS:   Severe Malnutrition related to chronic illness, cancer and cancer related treatments as evidenced by severe fat depletion, severe muscle depletion, percent weight loss, energy intake < or equal to 75% for > or equal to 1 month.  GOAL:   Patient will meet greater than or equal to 90% of their needs  Progressing.  MONITOR:   PO intake, Supplement acceptance, Labs, Weight trends  ASSESSMENT:   73 y.o. male presented to the ED with hypotension from heme/onc clinic. PMH includes metastatic renal cell carcinoma, lung cancer, BPH, and chronic diarrhea. Pt admitted with community acquired pneumonia, AKI, and sepsis.  10/3 - Admitted   Patient  reports feeling much better than he has for the past 6 weeks. Pt reports he has been having chronic diarrhea as a side effect of his cancer treatment. Pt also just lost his appetite and didn't want to eat. Pt likes strawberry ensure and drinks these at home, wife to bring some to the bedside today so pt can drink them. Doesn't like chocolate.  Pt denies any issues with swallowing, chewing or taste changes.  Pt states his appetite is much better and he is eating everything that is provided on his meal trays.   Calorie Count 10/5: B: 530 kcals, 11g protein L: 400 kcals, 10g protein D: 590 kcals, 21g protein +220 kcals from lemonade Supplements: none yet as flavor preference not provided Total: 1740 kcals, 42g protein *If patient consumes  Ensure, will meet closer to nutritional needs.  Per patient, UBW was 150 lbs prior to initial cancer diagnosis in 2022. Since starting treatment for met renal cancer, has lost 20 lbs. Per weight records, pt has lost 20 lbs since 8/13 (15% wt loss x <2 months, significant for time frame).   Medications: Multivitamin with minerals daily  Labs reviewed: CBGs: 65-109  NUTRITION - FOCUSED PHYSICAL EXAM:  Flowsheet Row Most Recent Value  Orbital Region Severe depletion  Upper Arm Region Severe depletion  Thoracic and Lumbar Region Severe depletion  Buccal Region Severe depletion  Temple Region Severe depletion  Clavicle Bone Region Severe depletion  Clavicle and Acromion Bone Region Severe depletion  Scapular Bone Region Severe depletion  Dorsal Hand Severe depletion  Patellar Region Severe depletion  Anterior Thigh Region Severe depletion  Posterior Calf Region Severe depletion  Edema (RD Assessment) None  Hair Unable to assess  [hat]  Eyes Reviewed  Mouth Reviewed  [missing teeth]  Skin Reviewed  Nails Reviewed    Diet Order:   Diet Order             Diet regular Room service appropriate? Yes; Fluid consistency: Thin  Diet effective now                   EDUCATION NEEDS:   Education needs have been addressed  Skin:  Skin Assessment: Reviewed RN Assessment  Last BM:  10/6 -type 6  Height:   Ht Readings from Last 1 Encounters:  01/15/24 6' 1 (1.854 m)  Weight:   Wt Readings from Last 1 Encounters:  01/15/24 49.5 kg    Ideal Body Weight:  83.6 kg  BMI:  Body mass index is 14.4 kg/m.  Estimated Nutritional Needs:   Kcal:  2100-2300  Protein:  100-125g  Fluid:  2.1L/day   Morna Lee, MS, RD, LDN Inpatient Clinical Dietitian Contact via Secure chat

## 2024-01-18 NOTE — Consult Note (Addendum)
 Referring Provider: TRH, Dr. Christobal Primary Care Physician:  [name], NP Primary Gastroenterologist:  Dr. Stacia  Reason for Consultation: Anemia, FOBT positive, diarrhea  HPI: Ian Hart is a 73 y.o. male with history of metastatic renal cell carcinoma, history of lung cancer small cell, failure to thrive, BPH who presented to the ED on 10/3 to the ER from the heme-onc clinic. Patient had been having diarrhea for the last 6 weeks ever since he received his first immunomodulator infusion in November 25, 2023. He states he developed a rash and diarrhea since then. He has been having 5-6 loose watery bowel movements every day. Daily nausea with dry heaves, but tells me that he has never actually vomited.  He recently treated with a 6-day Medrol  Dosepak which he states that did help his abdominal pain but did nothing for his diarrhea. He was seen in clinic,noted to be hypotensive and sent to the ER for evaluation.  Was treated with antibiotics for pneumonia.  His hemoglobin was drifting down during admission and he was found to be FOBT positive so GI was consulted.  Hemoglobin 13 g one year ago.  Seems to be downtrending since that time.  7.8 g yesterday, stable at 7.9 g today.  This is down from around 11 just a few days ago.  BUN was elevated at 27 on admission but Cr was up as well.  Iron studies and B12/folate levels good.  He does admit that the diarrhea is improving.  It is becoming more solid.  [place] previously it was like water.  He is taking Imodium, but says that previously that was not even helping.  No further abdominal pain.  He tells me that days ago his stools were very black in color.  Now they are transitioning back to more of black-brown color.  He tells me that he has never had issues with heartburn/reflux/indigestion so has never had an endoscopy in the past.  He denies any issues with swallowing, but tells me that recently he had totally lost his appetite.  No NSAID use at  home.  CT scan of the abdomen and pelvis without contrast 01/15/2024:  IMPRESSION: 1. No acute findings in the abdomen/pelvis. 2. Airspace process over the left lower lobe likely a pneumonia. 3. Minimal colonic diverticulosis. 4. Subcentimeter hypodensity over the upper pole right kidney too small to characterize, but likely a hyperdense cyst. 5. Aortic atherosclerosis.   Aortic Atherosclerosis (ICD10-I70.0).  Cologuard February 2024 was negative.  Colonoscopy January 09, 2012 (Dr. Obie) Left-sided diverticulosis, otherwise normal Repeat 10 years   Past Medical History:  Diagnosis Date   History of chicken pox    History of shingles 04/2010   Lung cancer (HCC) 02/2020   Routine general medical examination at a health care facility 11/10/2011    Past Surgical History:  Procedure Laterality Date   BRONCHIAL BIOPSY  03/22/2020   Procedure: BRONCHIAL BIOPSIES;  Surgeon: Brenna Adine CROME, DO;  Location: [facility];  Service: Pulmonary;;   BRONCHIAL BRUSHINGS  03/22/2020   Procedure: BRONCHIAL BRUSHINGS;  Surgeon: Brenna Adine CROME, DO;  Location: [facility];  Service: Pulmonary;;   BRONCHIAL NEEDLE ASPIRATION BIOPSY  03/22/2020   Procedure: BRONCHIAL NEEDLE ASPIRATION BIOPSIES;  Surgeon: Brenna Adine CROME, DO;  Location: [facility];  Service: Pulmonary;;   BRONCHIAL WASHINGS  03/22/2020   Procedure: BRONCHIAL WASHINGS;  Surgeon: Brenna Adine CROME, DO;  Location: [facility];  Service: Pulmonary;;   FIDUCIAL MARKER PLACEMENT  03/22/2020   Procedure: FIDUCIAL  MARKER PLACEMENT;  Surgeon: Brenna Adine CROME, DO;  Location: [facility];  Service: Pulmonary;;   IR IMAGING GUIDED PORT INSERTION  11/05/2023   IR LUMBAR PUNCTURE  11/05/2023   ROBOTIC ASSITED PARTIAL NEPHRECTOMY Left 2022   VIDEO BRONCHOSCOPY WITH ENDOBRONCHIAL NAVIGATION N/A 03/22/2020   Procedure: VIDEO BRONCHOSCOPY WITH ENDOBRONCHIAL NAVIGATION;  Surgeon: Brenna Adine CROME, DO;  Location: [facility];  Service:  Pulmonary;  Laterality: N/A;   VIDEO BRONCHOSCOPY WITH ENDOBRONCHIAL ULTRASOUND N/A 03/22/2020   Procedure: VIDEO BRONCHOSCOPY WITH ENDOBRONCHIAL ULTRASOUND;  Surgeon: Brenna Adine CROME, DO;  Location: [facility];  Service: Pulmonary;  Laterality: N/A;   VIDEO BRONCHOSCOPY WITH ENDOBRONCHIAL ULTRASOUND Bilateral 11/10/2023   Procedure: BRONCHOSCOPY, WITH EBUS;  Surgeon: Shelah Lamar RAMAN, MD;  Location: [facility] ENDOSCOPY;  Service: Cardiopulmonary;  Laterality: Bilateral;    Prior to Admission medications   Medication Sig Start Date End Date Taking? Authorizing Provider  alendronate  (FOSAMAX ) 70 MG tablet Take 1 tablet (70 mg total) by mouth every 7 (seven) days. Take with a full glass of water on an empty stomach. Patient taking differently: Take 70 mg by mouth every Friday. Take with a full glass of water on an empty stomach. 10/13/23  Yes O'Sullivan, Melissa, NP  Calcium Carbonate-Vit D-Min (CALTRATE 600+D PLUS MINERALS) 600-800 MG-UNIT TABS Take 1 tablet by mouth in the morning and at bedtime. Patient taking differently: Take 1 tablet by mouth daily with breakfast. 10/13/23  Yes O'Sullivan, Melissa, NP  CLARITIN 10 MG tablet Take 10 mg by mouth in the morning.   Yes [provider]  lidocaine -prilocaine  (EMLA ) cream Apply to affected area once Patient taking differently: Apply 1 Application topically as needed (for port access). 11/18/23  Yes Sherrod Sherrod, MD  LORazepam  (ATIVAN ) 0.5 MG tablet 1 tab po 30 minutes prior to radiation or MRI scans Patient taking differently: Take 0.5 mg by mouth See admin instructions. Take 0.5 mg by mouth 30 minutes prior to radiation or MRI scans 06/02/23  Yes Lanell Donald Stagger, PA-C  Multiple Vitamin (MULTIVITAMIN) tablet Take 1 tablet by mouth daily with breakfast.   Yes [provider]  ondansetron  (ZOFRAN ) 8 MG tablet Take 1 tablet (8 mg total) by mouth every 8 (eight) hours as needed for nausea or vomiting. 11/18/23  Yes Sherrod Sherrod, MD   prochlorperazine  (COMPAZINE ) 10 MG tablet Take 1 tablet (10 mg total) by mouth every 6 (six) hours as needed for nausea or vomiting. 11/18/23  Yes Sherrod Sherrod, MD  tamsulosin  (FLOMAX ) 0.4 MG CAPS capsule Take 1 capsule (0.4 mg total) by mouth daily. 10/23/23  Yes [name], NP  TYLENOL  500 MG tablet Take 500-1,000 mg by mouth every 6 (six) hours as needed (for pain).   Yes [provider]  methylPREDNISolone  (MEDROL  DOSEPAK) 4 MG TBPK tablet Use as instructed Patient not taking: Reported on 01/15/2024 01/07/24   Heilingoetter, Cassandra L, PA-C  Zoster Vaccine Adjuvanted (SHINGRIX ) injection 0.5ml IM now and again in 2-6 months Patient not taking: Reported on 01/15/2024 07/30/22   O'Sullivan, Melissa, NP    Current Facility-Administered Medications  Medication Dose Route Frequency Provider Last Rate Last Admin   acetaminophen  (TYLENOL ) tablet 1,000 mg  1,000 mg Oral Q6H PRN Laurence Locus, DO       Or   acetaminophen  (TYLENOL ) suppository 650 mg  650 mg Rectal Q6H PRN Laurence Locus, DO       azithromycin (ZITHROMAX) tablet 500 mg  500 mg Oral Daily Christobal Guadalajara, MD  benzonatate (TESSALON) capsule 100 mg  100 mg Oral BID Laurence Locus, DO   100 mg at 01/18/24 0936   cefTRIAXone (ROCEPHIN) 1 g in sodium chloride  0.9 % 100 mL IVPB  1 g Intravenous Q24H Christobal Guadalajara, MD   Stopped at 01/17/24 1608   Chlorhexidine  Gluconate Cloth 2 % PADS 6 each  6 each Topical Daily Laurence Locus, DO   6 each at 01/18/24 1046   feeding supplement (ENSURE PLUS HIGH PROTEIN) liquid 237 mL  237 mL Oral BID BM Kc, Guadalajara, MD   237 mL at 01/17/24 1530   heparin  injection 5,000 Units  5,000 Units Subcutaneous Q8H Laurence Locus, DO   5,000 Units at 01/18/24 0450   hydrocortisone cream 1 % 1 Application  1 Application Topical TID PRN Christobal Guadalajara, MD       ipratropium-albuterol (DUONEB) 0.5-2.5 (3) MG/3ML nebulizer solution 3 mL  3 mL Nebulization Q6H PRN Laurence Locus, DO       loperamide (IMODIUM) capsule 2 mg  2 mg Oral  PRN Christobal Guadalajara, MD   2 mg at 01/16/24 1643   midodrine (PROAMATINE) tablet 5 mg  5 mg Oral TID WC Kc, Guadalajara, MD   5 mg at 01/18/24 1152   multivitamin with minerals tablet 1 tablet  1 tablet Oral Daily Christobal Guadalajara, MD   1 tablet at 01/18/24 0936   ondansetron  (ZOFRAN ) tablet 4 mg  4 mg Oral Q6H PRN Laurence Locus, DO       Or   ondansetron  (ZOFRAN ) injection 4 mg  4 mg Intravenous Q6H PRN Laurence Locus, DO       Oral care mouth rinse  15 mL Mouth Rinse PRN Laurence Locus, DO       pantoprazole (PROTONIX) EC tablet 40 mg  40 mg Oral BID Christobal Guadalajara, MD   40 mg at 01/18/24 9063   sodium chloride  flush (NS) 0.9 % injection 10-40 mL  10-40 mL Intracatheter Q12H Laurence Locus, DO   10 mL at 01/18/24 9063   sodium chloride  flush (NS) 0.9 % injection 10-40 mL  10-40 mL Intracatheter PRN Laurence Locus, DO       tamsulosin  (FLOMAX ) capsule 0.4 mg  0.4 mg Oral Daily Laurence Locus, DO   0.4 mg at 01/18/24 9063    Allergies as of 01/15/2024 - Review Complete 01/15/2024  Allergen Reaction Noted   Acyclovir and related Rash and Other (See Comments) 07/30/2011    Family History  Problem Relation Age of Onset   Sudden death Mother    Rheumatic fever Mother    COPD Brother    Liver disease Neg Hx    Colon cancer Neg Hx    Esophageal cancer Neg Hx     Social History   Socioeconomic History   Marital status: Married    Spouse name: Not on file   Number of children: 1   Years of education: Not on file   Highest education level: Associate degree: academic program  Occupational History   Occupation: retired  Tobacco Use   Smoking status: Former    Current packs/day: 0.00    Average packs/day: 2.0 packs/day for 50.0 years (100.0 ttl pk-yrs)    Types: Cigarettes    Quit date: 07/13/2021    Years since quitting: 2.5   Smokeless tobacco: Never   Tobacco comments:    07/25/20  Vaping Use   Vaping status: Never Used  Substance and Sexual Activity   Alcohol use: Yes    Alcohol/week: 10.0  standard drinks of alcohol     Types: 10 Standard drinks or equivalent per week    Comment: 2 drinks per night   Drug use: Not Currently   Sexual activity: Yes  Other Topics Concern   Not on file  Social History Narrative   Regular exercise:  2-3 x weekly (sports, yardwork)   Caffeine use:  3 cups coffee daily   36 yr old son   Wife   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           Social Drivers of Corporate investment banker Strain: Low Risk  (08/07/2023)   Overall Financial Resource Strain (CARDIA)    Difficulty of Paying Living Expenses: Not hard at all  Food Insecurity: No Food Insecurity (01/15/2024)   Hunger Vital Sign    Worried About Running Out of Food in the Last Year: Never true    Ran Out of Food in the Last Year: Never true  Transportation Needs: No Transportation Needs (01/15/2024)   PRAPARE - Administrator, Civil Service (Medical): No    Lack of Transportation (Non-Medical): No  Physical Activity: Unknown (10/05/2023)   Exercise Vital Sign    Days of Exercise per Week: 6 days    Minutes of Exercise per Session: Not on file  Stress: Stress Concern Present (08/07/2023)   [employer] of Occupational Health - Occupational Stress Questionnaire    Feeling of Stress : To some extent  Social Connections: Socially Integrated (01/15/2024)   Social Connection and Isolation Panel    Frequency of Communication with Friends and Family: Three times a week    Frequency of Social Gatherings with Friends and Family: Once a week    Attends Religious Services: More than 4 times per year    Active Member of [employer] or Organizations: Yes    Attends Engineer, structural: More than 4 times per year    Marital Status: Married  Catering manager Violence: Not At Risk (01/15/2024)   Humiliation, Afraid, Rape, and Kick questionnaire    Fear of Current or Ex-Partner: No    Emotionally Abused: No    Physically Abused: No    Sexually Abused: No     Review of Systems: ROS is O/W negative except as mentioned in HPI.  Physical Exam: Vital signs in last 24 hours: Temp:  [97.6 F (36.4 C)-98.6 F (37 C)] 98 F (36.7 C) (10/06 0615) Pulse Rate:  [52-84] 67 (10/06 0615) Resp:  [17-24] 18 (10/06 0615) BP: (91-113)/(48-73) 103/52 (10/06 0615) SpO2:  [66 %-100 %] 98 % (10/06 0615) Last BM Date : 01/18/24 General:  Alert, thin and chronically ill-appearing, pleasant and cooperative in NAD Head:  Normocephalic and atraumatic. Eyes:  Sclera clear, no icterus.  Conjunctiva pink. Ears:  Normal auditory acuity. Mouth:  No deformity or lesions.   Lungs:  Clear throughout to auscultation.   No wheezes, crackles, or rhonchi.  Heart:  Regular rate and rhythm; no murmurs, clicks, rubs, or gallops. Abdomen:  Soft, nondistended.  Bowel sounds present.  Nontender.  Msk:  Symmetrical without gross deformities.  Pulses:  Normal pulses noted. Extremities:  Without clubbing or edema. Neurologic:  Alert and  oriented x4;  grossly normal neurologically. Skin:  Intact without significant lesions or rashes. Psych:  Alert and cooperative. Normal mood and affect.  Intake/Output from previous day: 10/05 0701 - 10/06 0700 In: 110 [I.V.:10; IV Piggyback:100] Out: -  Intake/Output  this shift: Total I/O In: 250 [P.O.:240; I.V.:10] Out: -   Lab Results: Recent Labs    01/16/24 0446 01/17/24 0439 01/18/24 0441  WBC 8.7 6.3 7.0  HGB 9.8* 7.8* 7.9*  HCT 31.4* 24.3* 24.1*  PLT 308 258 279   BMET Recent Labs    01/16/24 0446 01/17/24 0439 01/18/24 0441  NA 135 136 135  K 3.7 3.3* 3.9  CL 101 105 106  CO2 22 23 23   GLUCOSE 62* 78 86  BUN 20 15 12   CREATININE 1.02 0.89 0.86  CALCIUM 8.6* 7.6* 7.7*   LFT Recent Labs    01/16/24 0446  PROT 5.4*  ALBUMIN 3.2*  AST 15  ALT 19  ALKPHOS 86  BILITOT 0.4   IMPRESSION:  *Anemia and FOBT positive stool: Hemoglobin 13 g one year ago.  Seems to be downtrending since that time.  7.8 g  yesterday, stable at 7.9 g today.  This is down from around 11 just a few days ago.  BUN was elevated at 27 on admission but Cr was up as well.  Iron studies and B12/folate levels good.  Reports black stools for a couple of days and now they are transitioning to a very dark brown color.  No NSAID use, but was recently on just a Medrol  Dosepak. *Chronic diarrhea:  Started after immunotherapy use on November 25, 2023 (nivolumab  (OPDIVO ), ipilimumab  (YERVOY )).  Then received a second round of this in September.  GI pathogen panel and C. difficile negative.  Diarrhea is improving now.  Continue Imodium.  He tells me that the plan is to postpone his next round of immunotherapy and not give both of them at the same time. 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  Most recent MRI of the brain in July showed the previously demonstrated lesions are significantly improved or resolved.  PLAN: -Monitor Hgb and transfuse prn. -Possible EGD on 10/7 with Dr. Abran. -Pantoprazole 40 mg PO BID for now.   Harlene BIRCH. Zehr  01/18/2024, 1:36 PM  GI ATTENDING  History, laboratories, x-rays viewed.  Patient seen and examined as outlined above.  Agree with comprehensive consultation note as outlined above.  Complex patient with a history of lung cancer and renal cell cancer on urology.  Follow-up with diarrhea.  Concerns now, with dark stools, and drop in hemoglobin possible upper GI bleeding.  Stable.  Agree with IV PPI.  Plans for endoscopy tomorrow.  Patient is high risk given his comorbidities.  The nature of the procedure, as well as the risks, benefits, and alternatives were carefully and thoroughly reviewed with the patient. Ample time for discussion and questions allowed. The patient understood, was satisfied, and agreed to proceed. Continue to monitor symptoms and blood counts carefully.  Medical decision making was of a highly complex nature.  [name]. Abran Raddle., M.D. [facility] Division of Gastroenterology

## 2024-01-18 NOTE — Progress Notes (Signed)
   01/18/24 1551  TOC Brief Assessment  Insurance and Status Reviewed  Patient has primary care physician Yes Claryce, Melissa, NP)  Home environment has been reviewed Home with spouse  Prior level of function: Independent  Prior/Current Home Services No current home services  Social Drivers of Health Review SDOH reviewed no interventions necessary  Readmission risk has been reviewed Yes  Transition of care needs no transition of care needs at this time

## 2024-01-18 NOTE — Progress Notes (Signed)
 PROGRESS NOTE Ian Hart  FMW:[id number] DOB: May 30, 1950 DOA: 01/15/2024 PCP: [name], NP  Brief Narrative/Hospital Course: 73 year old male history of metastatic renal cell carcinoma, history of lung cancer small cell, failure to thrive, BPH, presents to the ER from the heme-onc clinic.  Patient has been having diarrhea for the last 6 weeks ever since he received his first immunomodulator infusion in November 25, 2023.  He states he developed a rash and diarrhea since then.  He has been having 5-6 loose watery bowel movements every day.  Daily nausea with occasional vomiting.  He has had chills and cough He recently treated with a 6-day Medrol  Dosepak which he states that did help his abdominal pain but did nothing for his diarrhea.  He was seen in clinic,noted to be hypotensive and sent to the ER for evaluation. On arrival temp 98 heart rate 106 blood pressure 96/53 satting 97% on room air Labs showed hypomagnesemia and leukocytosis 12.7  BUN 27, Scr 1.4  alb 3.4, AST 12, ALT 21, alk phos 96 Recent C. Diff Neg antigen, negative toxin CT abd/pelvis>>No acute findings in the abdomen/pelvis.Airspace process over the left lower lobe likely a pneumonia. 3. Minimal colonic diverticulosis 4. Subcentimeter hypodensity over the upper pole right kidney too small to characterize, but likely a hyperdense cyst. 5. Aortic atherosclerosis. Patient was placed on antibiotics and admitted for pneumonia/sepsis.  Subjective: Seen and examined He feels his diarrhea is improving able to distinguish urine from stool now Overall feeling better overnight afebrile BP stable labs reviewed this morning stable BMP hemoglobin stable at 7.9  Assessment and plan:   Sepsis POA LLL CAP: Patient with soft blood pressure leukocytosis CT abdomen pelvis with left lower lobe pneumonia. Blood culture no growth to date.  Urine antigens pending.  Overall clinically improved-WBC normalized. BP relatively stable on  midodrine. Continue Rocephin and azithromycin.  On midodrine 10 3 times daily will decrease to 5 mg tid, and stop before d/c.   Chronic diarrhea: After immunotherapy use on November 25, 2023(nivolumab  (OPDIVO ), ipilimumab  (YERVOY ) ). Some relief from abdominal pain following Medrol  Dosepak. Continue Imodium, oncology was notified on admission -Dr. Melodi will see the patient today.   AKI Resolved.   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  Recent Labs    01/07/24 0951 01/15/24 1140 01/16/24 0446 01/17/24 0439 01/18/24 0441 01/18/24 0442  HGB 11.1* 11.0* 9.8* 7.8* 7.9*  --   MCV 90.8 91.7 95.4 97.6 95.6  --   RETICCTPCT  --   --   --   --   --  1.3    Severe malnutrition Body mass index is 14.4 kg/m.: Augment diet.  DVT prophylaxis: heparin  injection 5,000 Units Start: 01/15/24 2200 SCDs Start: 01/15/24 1835 Code Status:   Code Status: Full Code Family Communication: plan of care discussed with patient at bedside. Patient status is: Remains hospitalized because of severity of illness Level of care: Telemetry > transfer to telemetry If remains stable  Dispo: The patient is from: home            Anticipated disposition: TBD Objective: Vitals last 24 hrs: Vitals:   01/17/24 1722 01/17/24 2104 01/18/24 0253 01/18/24 0615  BP: 91/73 (!) 107/53 (!) 112/57 (!) 103/52  Pulse: 84 73 69  67  Resp: 18 20 20 18   Temp: 97.6 F (36.4 C) 98.6 F (37 C) 98 F (36.7 C) 98 F (36.7 C)  TempSrc: Oral     SpO2: 100% 97% 97% 98%  Weight:      Height:        Physical Examination: General exam: AAOX3, cachectic HEENT:Oral mucosa moist, Ear/Nose WNL grossly Respiratory system: CTA,no use of  accessory muscle. Cardiovascular system: S1 & S2 +,No,JVD. Gastrointestinal system: Abdomen soft,NT,ND,BS+ Nervous System: Alert, awake, moving all extremities,and following commands. Extremities: extremities warm, leg edema neg. Skin: No rashes,no icterus. MSK: Normal muscle bulk,tone, power.   Medications reviewed:  Scheduled Meds:  azithromycin  500 mg Oral Daily   benzonatate  100 mg Oral BID   Chlorhexidine  Gluconate Cloth  6 each Topical Daily   feeding supplement  237 mL Oral BID BM   heparin   5,000 Units Subcutaneous Q8H   midodrine  10 mg Oral TID WC   multivitamin with minerals  1 tablet Oral Daily   pantoprazole  40 mg Oral BID   sodium chloride  flush  10-40 mL Intracatheter Q12H   tamsulosin   0.4 mg Oral Daily   Continuous Infusions:  cefTRIAXone (ROCEPHIN)  IV Stopped (01/17/24 1608)   Diet: Diet Order             Diet regular Room service appropriate? Yes; Fluid consistency: Thin  Diet effective now                    Data Reviewed: I have personally reviewed following labs and imaging studies ( see epic result tab) CBC: Recent Labs  Lab 01/15/24 1140 01/16/24 0446 01/17/24 0439 01/18/24 0441  WBC 12.8* 8.7 6.3 7.0  NEUTROABS 10.6* 6.3  --   --   HGB 11.0* 9.8* 7.8* 7.9*  HCT 33.1* 31.4* 24.3* 24.1*  MCV 91.7 95.4 97.6 95.6  PLT 371 308 258 279   CMP: Recent Labs  Lab 01/15/24 1140 01/16/24 0446 01/17/24 0439 01/18/24 0441  NA 132* 135 136 135  K 4.0 3.7 3.3* 3.9  CL 99 101 105 106  CO2 27 22 23 23   GLUCOSE 116* 62* 78 86  BUN 27* 20 15 12   CREATININE 1.42* 1.02 0.89 0.86  CALCIUM 9.6 8.6* 7.6* 7.7*  MG 1.1* 1.5*  --   --    GFR: Estimated Creatinine Clearance: 53.6 mL/min (by C-G formula based on SCr of 0.86 mg/dL). Recent Labs  Lab 01/15/24 1140 01/16/24 0446  AST 12* 15  ALT 21 19  ALKPHOS 96 86  BILITOT 0.4 0.4  PROT 6.7 5.4*  ALBUMIN 3.5 3.2*   No results for input(s): LIPASE, AMYLASE in the last 168 hours. No  results for input(s): AMMONIA in the last 168 hours. Coagulation Profile: No results for input(s): INR, PROTIME in the last 168 hours. Unresulted Labs (From admission, onward)     Start     Ordered   01/18/24 0500  Vitamin B12  Once,   R        01/18/24 0500   01/18/24 0500  Iron and TIBC  Once,   R        01/18/24 0500   01/18/24 0500  Ferritin  Once,   R        01/18/24 0500   01/18/24 0500  Folate  Once,   R        01/18/24 0500   01/17/24 0500  Basic metabolic panel with GFR  Daily,  R      01/16/24 0726   01/17/24 0500  CBC  Daily,   R      01/16/24 0726   01/15/24 1835  Legionella Pneumophila Serogp 1 Ur Ag  (COPD / Pneumonia / Cellulitis / Lower Extremity Wound (Diabetic Foot Infection))  Once,   R        01/15/24 1834   01/15/24 1835  Strep pneumoniae urinary antigen  (COPD / Pneumonia / Cellulitis / Lower Extremity Wound (Diabetic Foot Infection))  Once,   R        01/15/24 1834           Antimicrobials/Microbiology: Anti-infectives (From admission, onward)    Start     Dose/Rate Route Frequency Ordered Stop   01/18/24 1630  azithromycin (ZITHROMAX) tablet 500 mg        500 mg Oral Daily 01/18/24 0941 01/20/24 1629   01/15/24 1645  azithromycin (ZITHROMAX) 500 mg in sodium chloride  0.9 % 250 mL IVPB  Status:  Discontinued        500 mg 250 mL/hr over 60 Minutes Intravenous Every 24 hours 01/15/24 1638 01/18/24 0941   01/15/24 1645  cefTRIAXone (ROCEPHIN) 1 g in sodium chloride  0.9 % 100 mL IVPB        1 g 200 mL/hr over 30 Minutes Intravenous Every 24 hours 01/15/24 1638 01/19/24 2359         Component Value Date/Time   SDES  01/15/2024 1935    BLOOD RIGHT ARM Performed at [facility], [address], [place], [place] [address]    SPECREQUEST  01/15/2024 1935    BOTTLES DRAWN AEROBIC AND ANAEROBIC Blood Culture adequate volume Performed at [facility], [address], [place], [place] [address]    CULT  01/15/2024 1935     NO GROWTH 3 DAYS Performed at [facility], [address] [place], [place] [address]    REPTSTATUS PENDING 01/15/2024 1935    Procedures:  Mennie LAMY, MD [facility] 01/18/2024, 10:48 AM

## 2024-01-19 ENCOUNTER — Encounter (HOSPITAL_COMMUNITY): Payer: Self-pay | Admitting: Internal Medicine

## 2024-01-19 ENCOUNTER — Inpatient Hospital Stay (HOSPITAL_COMMUNITY): Admitting: Anesthesiology

## 2024-01-19 ENCOUNTER — Encounter: Payer: Self-pay | Admitting: Internal Medicine

## 2024-01-19 ENCOUNTER — Other Ambulatory Visit: Payer: Self-pay | Admitting: Physician Assistant

## 2024-01-19 ENCOUNTER — Other Ambulatory Visit (HOSPITAL_COMMUNITY): Payer: Self-pay

## 2024-01-19 ENCOUNTER — Encounter: Payer: Self-pay | Admitting: Physician Assistant

## 2024-01-19 ENCOUNTER — Encounter: Payer: Self-pay | Attending: Internal Medicine

## 2024-01-19 DIAGNOSIS — D62 Acute posthemorrhagic anemia: Secondary | ICD-10-CM

## 2024-01-19 DIAGNOSIS — K2950 Unspecified chronic gastritis without bleeding: Secondary | ICD-10-CM | POA: Diagnosis not present

## 2024-01-19 DIAGNOSIS — C642 Malignant neoplasm of left kidney, except renal pelvis: Secondary | ICD-10-CM

## 2024-01-19 DIAGNOSIS — K31819 Angiodysplasia of stomach and duodenum without bleeding: Secondary | ICD-10-CM | POA: Diagnosis not present

## 2024-01-19 DIAGNOSIS — K259 Gastric ulcer, unspecified as acute or chronic, without hemorrhage or perforation: Secondary | ICD-10-CM

## 2024-01-19 DIAGNOSIS — K250 Acute gastric ulcer with hemorrhage: Secondary | ICD-10-CM

## 2024-01-19 DIAGNOSIS — J189 Pneumonia, unspecified organism: Secondary | ICD-10-CM | POA: Diagnosis not present

## 2024-01-19 DIAGNOSIS — K921 Melena: Secondary | ICD-10-CM

## 2024-01-19 DIAGNOSIS — E43 Unspecified severe protein-calorie malnutrition: Secondary | ICD-10-CM | POA: Insufficient documentation

## 2024-01-19 DIAGNOSIS — K529 Noninfective gastroenteritis and colitis, unspecified: Secondary | ICD-10-CM | POA: Diagnosis not present

## 2024-01-19 HISTORY — PX: ESOPHAGOGASTRODUODENOSCOPY: SHX5428

## 2024-01-19 LAB — BASIC METABOLIC PANEL WITH GFR
Anion gap: 8 (ref 5–15)
BUN: 11 mg/dL (ref 8–23)
CO2: 24 mmol/L (ref 22–32)
Calcium: 8 mg/dL — ABNORMAL LOW (ref 8.9–10.3)
Chloride: 102 mmol/L (ref 98–111)
Creatinine, Ser: 0.83 mg/dL (ref 0.61–1.24)
GFR, Estimated: >60 mL/min (ref >60)
Glucose, Bld: 77 mg/dL (ref 70–99)
Potassium: 4 mmol/L (ref 3.5–5.1)
Sodium: 133 mmol/L — ABNORMAL LOW (ref 135–145)

## 2024-01-19 LAB — CBC
HCT: 26.5 % — ABNORMAL LOW (ref 39.0–52.0)
Hemoglobin: 8.3 g/dL — ABNORMAL LOW (ref 13.0–17.0)
MCH: 30.2 pg (ref 26.0–34.0)
MCHC: 31.3 g/dL (ref 30.0–36.0)
MCV: 96.4 fL (ref 80.0–100.0)
Platelets: 278 K/uL (ref 150–400)
RBC: 2.75 MIL/uL — ABNORMAL LOW (ref 4.22–5.81)
RDW: 13.8 % (ref 11.5–15.5)
WBC: 7.6 K/uL (ref 4.0–10.5)
nRBC: 0 % (ref 0.0–0.2)

## 2024-01-19 SURGERY — EGD (ESOPHAGOGASTRODUODENOSCOPY)
Anesthesia: Monitor Anesthesia Care

## 2024-01-19 MED ORDER — PROPOFOL 500 MG/50ML IV EMUL
INTRAVENOUS | Status: DC | PRN
  Administered 2024-01-19: 60 ug/kg/min via INTRAVENOUS
  Administered 2024-01-19 (×2): 50 mg via INTRAVENOUS

## 2024-01-19 MED ORDER — PROPOFOL 10 MG/ML IV BOLUS
INTRAVENOUS | Status: AC
  Filled 2024-01-19: qty 20

## 2024-01-19 MED ORDER — PANTOPRAZOLE SODIUM 40 MG PO TBEC
DELAYED_RELEASE_TABLET | ORAL | 1 refills | Status: AC
  Filled 2024-01-19: qty 60, 30d supply, fill #0
  Filled 2024-03-15: qty 60, 30d supply, fill #1
  Filled 2024-03-16: qty 60, 30d supply, fill #0

## 2024-01-19 MED ORDER — HEPARIN SOD (PORK) LOCK FLUSH 100 UNIT/ML IV SOLN
500.0000 [IU] | Freq: Once | INTRAVENOUS | Status: AC
  Administered 2024-01-19: 500 [IU] via INTRAVENOUS
  Filled 2024-01-19: qty 5

## 2024-01-19 MED ORDER — PHENYLEPHRINE HCL (PRESSORS) 10 MG/ML IV SOLN
INTRAVENOUS | Status: DC | PRN
  Administered 2024-01-19 (×2): 160 ug via INTRAVENOUS
  Administered 2024-01-19: 80 ug via INTRAVENOUS

## 2024-01-19 MED ORDER — SODIUM CHLORIDE 0.9 % IV SOLN
INTRAVENOUS | Status: AC | PRN
  Administered 2024-01-19: 500 mL via INTRAVENOUS

## 2024-01-19 MED ORDER — PROPOFOL 1000 MG/100ML IV EMUL
INTRAVENOUS | Status: AC
  Filled 2024-01-19: qty 100

## 2024-01-19 MED ORDER — CEFADROXIL 500 MG PO CAPS
500.0000 mg | ORAL_CAPSULE | Freq: Two times a day (BID) | ORAL | 0 refills | Status: AC
  Filled 2024-01-19: qty 6, 3d supply, fill #0

## 2024-01-19 MED ORDER — EPHEDRINE SULFATE-NACL 50-0.9 MG/10ML-% IV SOSY
PREFILLED_SYRINGE | INTRAVENOUS | Status: DC | PRN
Start: 2024-01-19 — End: 2024-01-19
  Administered 2024-01-19: 10 mg via INTRAVENOUS

## 2024-01-19 NOTE — Discharge Summary (Signed)
 Physician Discharge Summary  Ian Hart FMW:[id number] DOB: 1950/08/29 DOA: 01/15/2024  PCP: [name], NP  Admit date: 01/15/2024 Discharge date: 01/19/2024 Recommendations for Outpatient Follow-up:  Follow up with PCP in 1 weeks-call for appointment Please obtain BMP/CBC in one week  Discharge Dispo: Home Discharge Condition: Stable Code Status:   Code Status: Full Code Diet recommendation:  Diet Order             Diet Heart Room service appropriate? Yes; Fluid consistency: Thin  Diet effective now           Diet - low sodium heart healthy                    Brief/Interim Summary: 73 year old male history of metastatic renal cell carcinoma, history of lung cancer small cell, failure to thrive, BPH, presents to the ER from the heme-onc clinic.  Patient has been having diarrhea for the last 6 weeks ever since he received his first immunomodulator infusion in November 25, 2023.  He states he developed a rash and diarrhea since then.  He has been having 5-6 loose watery bowel movements every day.  Daily nausea with occasional vomiting.  He has had chills and cough He recently treated with a 6-day Medrol  Dosepak which he states that did help his abdominal pain but did nothing for his diarrhea.  He was seen in clinic,noted to be hypotensive and sent to the ER for evaluation. On arrival temp 98 heart rate 106 blood pressure 96/53 satting 97% on room air Labs showed hypomagnesemia and leukocytosis 12.7  BUN 27, Scr 1.4  alb 3.4, AST 12, ALT 21, alk phos 96 Recent C. Diff Neg antigen, negative toxin CT abd/pelvis>>No acute findings in the abdomen/pelvis.Airspace process over the left lower lobe likely a pneumonia. 3. Minimal colonic diverticulosis 4. Subcentimeter hypodensity over the upper pole right kidney too small to characterize, but likely a hyperdense cyst. 5. Aortic atherosclerosis. Patient was placed on antibiotics and admitted for pneumonia/sepsis. Sepsis pneumonia  improving antibiotics.  He had drop in hemoglobin, seen by GI FOBT positive plan for EGD 10/7. Patient is hoping to go home soon  Subjective: Seen and examined He is hoping to go home today  Discharge diagnosis   Sepsis POA LLL CAP: Patient with soft blood pressure leukocytosis CT abdomen pelvis with left lower lobe pneumonia. Blood culture no growth to date.Overall clinically improved-WBC normalized. BP relatively stable on midodrine and weaning off . Continue Rocephin and azithromycin while here changed to oral antibiotics upon discharge.    Chronic diarrhea: After immunotherapy use on November 25, 2023(nivolumab  (OPDIVO ), ipilimumab  (YERVOY ) ). Some relief from abdominal pain following Medrol  Dosepak. Continue Imodium, oncology has seen the patient.     AKI Resolved.   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  Hemoglobin trending up.  Plan for EGD, continue PPI  EGD completed recent GI bleed secondary to pyloric channel ulcer no active bleeding or stigmata biopsies taken for H. pylori otherwise unremarkable advised PPI twice daily x 4 weeks then 40 daily indefinitely okay to discharge from GI standpoint after diet. Recent Labs    01/15/24 1140 01/16/24 0446 01/17/24 0439 01/18/24 0441 01/18/24 0442 01/18/24 1249 01/19/24 0520  HGB 11.0* 9.8* 7.8* 7.9*  --   --  8.3*  MCV 91.7 95.4 97.6 95.6  --   --  96.4  VITAMINB12  --   --   --   --   --  707  --   FOLATE  --   --   --   --   --  12.4  --   FERRITIN  --   --   --   --   --  600*  --   TIBC  --   --   --   --   --  185*  --   IRON  --   --   --   --   --  48  --   RETICCTPCT  --   --   --   --  1.3  --   --     Severe malnutrition Body  mass index is 14.51 kg/m.: Augment diet.  RD following  DVT prophylaxis: heparin  injection 5,000 Units Start: 01/15/24 2200 SCDs Start: 01/15/24 1835 Code Status:   Code Status: Full Code Family Communication: plan of care discussed with patient at bedside. Patient status is: Remains hospitalized because of severity of illness Level of care: Telemetry   Dispo: The patient is from: home            Anticipated disposition: home pending egd result Objective: Vitals last 24 hrs: Vitals:   01/19/24 1420 01/19/24 1425 01/19/24 1430 01/19/24 1435  BP:  (!) 105/54 (!) 101/55   Pulse: 96 89 92 87  Resp: 18 (!) 22 20 20   Temp:      TempSrc:      SpO2: 97% 96% 95% 97%  Weight:      Height:        Physical Examination: General exam: AAOX3, thin frail HEENT:Oral mucosa moist, Ear/Nose WNL grossly Respiratory system: CTA,no use of accessory muscle. Cardiovascular system: S1 & S2 +,No,JVD. Gastrointestinal system: Abdomen soft,NT,ND,BS+ Nervous System: Alert, awake, moving all extremities,and following commands. Extremities: extremities warm, leg edema neg. Skin: No rashes,no icterus. MSK: Normal muscle bulk,tone, power.      Procedure(s) (LRB): EGD (ESOPHAGOGASTRODUODENOSCOPY) (N/A)  Consultation: See note.  Discharge Instructions  Discharge Instructions     Diet - low sodium heart healthy   Complete by: As directed    Discharge instructions   Complete by: As directed    Please call call MD or return to ER for similar or worsening recurring problem that brought you to hospital or if any fever,nausea/vomiting,abdominal pain, uncontrolled pain, chest pain,  shortness of breath or any other alarming symptoms.  Please follow-up with gastroenterology for biopsy regarding H. Pylori  Please follow-up your doctor as instructed in a week time and call the office for appointment.  Please avoid alcohol, smoking, or any other illicit substance and maintain healthy habits including  taking your regular medications as prescribed.  You were cared for by a hospitalist during your hospital stay. If you have any questions about your discharge medications or the care you received while you were in the hospital after you are discharged, you can call the unit and ask to speak with the hospitalist on call if the hospitalist that took care of you is not available.  Once you are discharged, your primary care physician will handle any further medical issues. Please note that NO REFILLS for any discharge medications will be authorized once you are discharged, as it is imperative that you return to your primary care physician (or establish a relationship with a primary care  physician if you do not have one) for your aftercare needs so that they can reassess your need for medications and monitor your lab values   Increase activity slowly   Complete by: As directed       Allergies as of 01/19/2024       Reactions   Acyclovir And Related Rash, Other (See Comments)   Rash that looked like chicken pox        Medication List     STOP taking these medications    methylPREDNISolone  4 MG Tbpk tablet Commonly known as: MEDROL  DOSEPAK       TAKE these medications    alendronate  70 MG tablet Commonly known as: FOSAMAX  Take 1 tablet (70 mg total) by mouth every 7 (seven) days. Take with a full glass of water on an empty stomach. What changed: when to take this   Caltrate 600+D Plus Minerals 600-800 MG-UNIT Tabs Take 1 tablet by mouth in the morning and at bedtime. What changed: when to take this   cefadroxil 500 MG capsule Commonly known as: DURICEF Take 1 capsule (500 mg total) by mouth 2 (two) times daily for 3 days.   Claritin 10 MG tablet Generic drug: loratadine Take 10 mg by mouth in the morning.   lidocaine -prilocaine  cream Commonly known as: EMLA  Apply to affected area once What changed:  how much to take how to take this when to take this reasons to take  this additional instructions   LORazepam  0.5 MG tablet Commonly known as: Ativan  1 tab po 30 minutes prior to radiation or MRI scans What changed:  how much to take how to take this when to take this additional instructions   multivitamin tablet Take 1 tablet by mouth daily with breakfast.   ondansetron  8 MG tablet Commonly known as: Zofran  Take 1 tablet (8 mg total) by mouth every 8 (eight) hours as needed for nausea or vomiting.   pantoprazole 40 MG tablet Commonly known as: Protonix Take 1 tab BID x 4 wks then 1 tablet daily   prochlorperazine  10 MG tablet Commonly known as: COMPAZINE  Take 1 tablet (10 mg total) by mouth every 6 (six) hours as needed for nausea or vomiting.   Shingrix  injection Generic drug: Zoster Vaccine Adjuvanted 0.5ml IM now and again in 2-6 months   tamsulosin  0.4 MG Caps capsule Commonly known as: FLOMAX  Take 1 capsule (0.4 mg total) by mouth daily.   TYLENOL  500 MG tablet Generic drug: acetaminophen  Take 500-1,000 mg by mouth every 6 (six) hours as needed (for pain).        Follow-up Information     [name], NP Follow up in 1 week(s).   Specialty: Internal Medicine Contact information: [address] [place] [address] [phone]                Allergies  Allergen Reactions   Acyclovir And Related Rash and Other (See Comments)    Rash that looked like chicken pox    The results of significant diagnostics from this hospitalization (including imaging, microbiology, ancillary and laboratory) are listed below for reference.    Microbiology: Recent Results (from the past 240 hours)  C difficile quick screen w PCR reflex     Status: None   Collection Time: 01/14/24 12:25 PM   Specimen: STOOL  Result Value Ref Range Status   C Diff antigen NEGATIVE NEGATIVE Final   C Diff toxin NEGATIVE NEGATIVE Final   C Diff interpretation  No C. difficile detected.  Final    Comment: Performed at [facility], [address] [place], [place] [address]  GI pathogen panel by PCR, stool     Status: None   Collection Time: 01/14/24 12:25 PM   Specimen: Stool  Result Value Ref Range Status   Plesiomonas shigelloides NOT DETECTED NOT DETECTED Final   Yersinia enterocolitica NOT DETECTED NOT DETECTED Final   Vibrio NOT DETECTED NOT DETECTED Final   Enteropathogenic E coli NOT DETECTED NOT DETECTED Final   E coli (ETEC) LT/ST NOT DETECTED NOT DETECTED Final   E coli 0157 by PCR Not applicable NOT DETECTED Final   Cryptosporidium by PCR NOT DETECTED NOT DETECTED Final   Entamoeba histolytica NOT DETECTED NOT DETECTED Final   Adenovirus F 40/41 NOT DETECTED NOT DETECTED Final   Norovirus GI/GII NOT DETECTED NOT DETECTED Final   Sapovirus NOT DETECTED NOT DETECTED Final    Comment: (NOTE) Performed At: [facility] [address] [place], [place] [id number] Jennette Shorter MD Ey:[phone]    Vibrio cholerae NOT DETECTED NOT DETECTED Final   Campylobacter by PCR NOT DETECTED NOT DETECTED Final   Salmonella by PCR NOT DETECTED NOT DETECTED Final   E coli (STEC) NOT DETECTED NOT DETECTED Final   Enteroaggregative E coli NOT DETECTED NOT DETECTED Final   Shigella by PCR NOT DETECTED NOT DETECTED Final   Cyclospora cayetanensis NOT DETECTED NOT DETECTED Final   Astrovirus NOT DETECTED NOT DETECTED Final   G lamblia by PCR NOT DETECTED NOT DETECTED Final   Rotavirus A by PCR NOT DETECTED NOT DETECTED Final  Culture, blood (Routine X 2) w Reflex to ID Panel     Status: None (Preliminary result)   Collection Time: 01/15/24  5:55 PM   Specimen: BLOOD  Result Value Ref Range Status   Specimen Description   Final    BLOOD LEFT ANTECUBITAL Performed at [facility], [address] [place], [place] [address]    Special Requests   Final    BOTTLES DRAWN AEROBIC ONLY Blood Culture results may not be optimal due to an inadequate volume of blood received in culture  bottles Performed at [facility], [address] [place], [place] [address]    Culture   Final    NO GROWTH 4 DAYS Performed at [facility], [address] [place], [place] [address]    Report Status PENDING  Incomplete  Culture, blood (Routine X 2) w Reflex to ID Panel     Status: None (Preliminary result)   Collection Time: 01/15/24  7:35 PM   Specimen: BLOOD RIGHT ARM  Result Value Ref Range Status   Specimen Description   Final    BLOOD RIGHT ARM Performed at [facility], [address], [place], [place] [address]    Special Requests   Final    BOTTLES DRAWN AEROBIC AND ANAEROBIC Blood Culture adequate volume Performed at [facility], [address], [place], [place] [address]    Culture   Final    NO GROWTH 4 DAYS Performed at [facility], [address] [place], [place] [address]    Report Status PENDING  Incomplete  MRSA Next Gen by PCR, Nasal     Status: None   Collection Time: 01/15/24 10:12 PM   Specimen: Nasal Mucosa; Nasal Swab  Result Value Ref Range Status   MRSA by PCR Next Gen NOT DETECTED NOT DETECTED  Final    Comment: (NOTE) The GeneXpert MRSA Assay (FDA approved for NASAL specimens only), is one component of a comprehensive MRSA colonization surveillance program. It is not intended to diagnose MRSA infection nor to guide or monitor treatment for MRSA infections. Test performance is not FDA approved in patients less than 64 years old. Performed at [facility], [address], [place], [place] [address]     Procedures/Studies: CT ABDOMEN PELVIS WO CONTRAST Result Date: 01/15/2024 CLINICAL DATA:  Abdominal pain and diarrhea 6 days. History of lung cancer. EXAM: CT ABDOMEN AND PELVIS WITHOUT CONTRAST TECHNIQUE: Multidetector CT imaging of the abdomen and pelvis was performed following the standard protocol without IV contrast. RADIATION DOSE REDUCTION: This exam was performed  according to the departmental dose-optimization program which includes automated exposure control, adjustment of the mA and/or kV according to patient size and/or use of iterative reconstruction technique. COMPARISON:  PET-CT 10/22/2023 FINDINGS: Lower chest: Heart is normal size. Calcification over the mitral valve annulus. Central venous catheter tip over the cavoatrial junction. Minimal calcified plaque over the descending thoracic aorta. Lung bases demonstrate airspace process over the left lower lobe likely infection. No effusion. Hepatobiliary: Liver and biliary tree are unchanged with small 1 cm hypodensity over the right lobe likely a cyst. Gallbladder not well visualized. Pancreas: Normal. Spleen: Normal. Adrenals/Urinary Tract: Adrenal glands are normal. Kidneys are normal in size without hydronephrosis or nephrolithiasis. Hyperdense subcentimeter upper pole right renal cortical focus likely hemorrhagic cyst as Hounsfield unit measurements are 64. Ureters are unremarkable without evidence of stones. Bladder is mildly distended but otherwise unremarkable. Stomach/Bowel: Stomach and small bowel are within normal. Appendix is normal. Minimal diverticulosis of the colon which is otherwise unremarkable. Vascular/Lymphatic: Mild calcified plaque over the abdominal aorta which is otherwise normal in caliber. Remaining vascular structures are unremarkable on this noncontrast exam. No definite adenopathy. Reproductive: Prostate is unremarkable. Other: No definite free fluid or focal inflammatory change. Musculoskeletal: Degenerative changes of the spine. No focal abnormality. IMPRESSION: 1. No acute findings in the abdomen/pelvis. 2. Airspace process over the left lower lobe likely a pneumonia. 3. Minimal colonic diverticulosis. 4. Subcentimeter hypodensity over the upper pole right kidney too small to characterize, but likely a hyperdense cyst. 5. Aortic atherosclerosis. Aortic Atherosclerosis (ICD10-I70.0).  Electronically Signed   By: Toribio Agreste M.D.   On: 01/15/2024 16:34    Labs: BNP (last 3 results) No results for input(s): BNP in the last 8760 hours. Basic Metabolic Panel: Recent Labs  Lab 01/15/24 1140 01/16/24 0446 01/17/24 0439 01/18/24 0441 01/19/24 0520  NA 132* 135 136 135 133*  K 4.0 3.7 3.3* 3.9 4.0  CL 99 101 105 106 102  CO2 27 22 23 23 24   GLUCOSE 116* 62* 78 86 77  BUN 27* 20 15 12 11   CREATININE 1.42* 1.02 0.89 0.86 0.83  CALCIUM 9.6 8.6* 7.6* 7.7* 8.0*  MG 1.1* 1.5*  --   --   --    Liver Function Tests: Recent Labs  Lab 01/15/24 1140 01/16/24 0446  AST 12* 15  ALT 21 19  ALKPHOS 96 86  BILITOT 0.4 0.4  PROT 6.7 5.4*  ALBUMIN 3.5 3.2*   No results for input(s): LIPASE, AMYLASE in the last 168 hours. No results for input(s): AMMONIA in the last 168 hours. CBC: Recent Labs  Lab 01/15/24 1140 01/16/24 0446 01/17/24 0439 01/18/24 0441 01/19/24 0520  WBC 12.8* 8.7 6.3 7.0 7.6  NEUTROABS 10.6* 6.3  --   --   --  HGB 11.0* 9.8* 7.8* 7.9* 8.3*  HCT 33.1* 31.4* 24.3* 24.1* 26.5*  MCV 91.7 95.4 97.6 95.6 96.4  PLT 371 308 258 279 278   CBG: Recent Labs  Lab 01/16/24 0549 01/16/24 0605  GLUCAP 65* 109*   Hgb A1c No results for input(s): HGBA1C in the last 72 hours. Anemia work up Recent Labs    01/18/24 0442 01/18/24 1249  VITAMINB12  --  707  FOLATE  --  12.4  FERRITIN  --  600*  TIBC  --  185*  IRON  --  48  RETICCTPCT 1.3  --   No results for input(s): TSH, T4TOTAL, T3FREE, THYROIDAB in the last 72 hours.  Invalid input(s): FREET3 Urinalysis    Component Value Date/Time   COLORURINE YELLOW 03/20/2020 1302   APPEARANCEUR CLEAR 03/20/2020 1302   LABSPEC 1.005 03/20/2020 1302   PHURINE 6.0 03/20/2020 1302   GLUCOSEU NEGATIVE 03/20/2020 1302   GLUCOSEU NEGATIVE 02/03/2014 0806   HGBUR NEGATIVE 03/20/2020 1302   BILIRUBINUR NEGATIVE 03/20/2020 1302   KETONESUR NEGATIVE 03/20/2020 1302   PROTEINUR  NEGATIVE 03/20/2020 1302   UROBILINOGEN 0.2 02/03/2014 0806   NITRITE NEGATIVE 03/20/2020 1302   LEUKOCYTESUR SMALL (A) 03/20/2020 1302   Sepsis Labs Recent Labs  Lab 01/16/24 0446 01/17/24 0439 01/18/24 0441 01/19/24 0520  WBC 8.7 6.3 7.0 7.6   Microbiology Recent Results (from the past 240 hours)  C difficile quick screen w PCR reflex     Status: None   Collection Time: 01/14/24 12:25 PM   Specimen: STOOL  Result Value Ref Range Status   C Diff antigen NEGATIVE NEGATIVE Final   C Diff toxin NEGATIVE NEGATIVE Final   C Diff interpretation No C. difficile detected.  Final    Comment: Performed at [facility], [address], [place], [place] [address]  GI pathogen panel by PCR, stool     Status: None   Collection Time: 01/14/24 12:25 PM   Specimen: Stool  Result Value Ref Range Status   Plesiomonas shigelloides NOT DETECTED NOT DETECTED Final   Yersinia enterocolitica NOT DETECTED NOT DETECTED Final   Vibrio NOT DETECTED NOT DETECTED Final   Enteropathogenic E coli NOT DETECTED NOT DETECTED Final   E coli (ETEC) LT/ST NOT DETECTED NOT DETECTED Final   E coli 0157 by PCR Not applicable NOT DETECTED Final   Cryptosporidium by PCR NOT DETECTED NOT DETECTED Final   Entamoeba histolytica NOT DETECTED NOT DETECTED Final   Adenovirus F 40/41 NOT DETECTED NOT DETECTED Final   Norovirus GI/GII NOT DETECTED NOT DETECTED Final   Sapovirus NOT DETECTED NOT DETECTED Final    Comment: (NOTE) Performed At: [facility] [address] [place], [place] [id number] Jennette Shorter MD Ey:[phone]    Vibrio cholerae NOT DETECTED NOT DETECTED Final   Campylobacter by PCR NOT DETECTED NOT DETECTED Final   Salmonella by PCR NOT DETECTED NOT DETECTED Final   E coli (STEC) NOT DETECTED NOT DETECTED Final   Enteroaggregative E coli NOT DETECTED NOT DETECTED Final   Shigella by PCR NOT DETECTED NOT DETECTED Final   Cyclospora cayetanensis NOT DETECTED NOT DETECTED Final    Astrovirus NOT DETECTED NOT DETECTED Final   G lamblia by PCR NOT DETECTED NOT DETECTED Final   Rotavirus A by PCR NOT DETECTED NOT DETECTED Final  Culture, blood (Routine X 2) w Reflex to ID Panel     Status: None (Preliminary result)   Collection Time: 01/15/24  5:55 PM  Specimen: BLOOD  Result Value Ref Range Status   Specimen Description   Final    BLOOD LEFT ANTECUBITAL Performed at [facility], [address] [place], [place] [address]    Special Requests   Final    BOTTLES DRAWN AEROBIC ONLY Blood Culture results may not be optimal due to an inadequate volume of blood received in culture bottles Performed at [facility], [address], [place], [place] [address]    Culture   Final    NO GROWTH 4 DAYS Performed at [facility], [address], [place], [place] [address]    Report Status PENDING  Incomplete  Culture, blood (Routine X 2) w Reflex to ID Panel     Status: None (Preliminary result)   Collection Time: 01/15/24  7:35 PM   Specimen: BLOOD RIGHT ARM  Result Value Ref Range Status   Specimen Description   Final    BLOOD RIGHT ARM Performed at [facility], [address], [place], [place] [address]    Special Requests   Final    BOTTLES DRAWN AEROBIC AND ANAEROBIC Blood Culture adequate volume Performed at [facility], [address], [place], [place] [address]    Culture   Final    NO GROWTH 4 DAYS Performed at [facility], [address], [place], [place] [address]    Report Status PENDING  Incomplete  MRSA Next Gen by PCR, Nasal     Status: None   Collection Time: 01/15/24 10:12 PM   Specimen: Nasal Mucosa; Nasal Swab  Result Value Ref Range Status   MRSA by PCR Next Gen NOT DETECTED NOT DETECTED Final    Comment: (NOTE) The GeneXpert MRSA Assay (FDA approved for NASAL specimens only), is one component of a comprehensive MRSA colonization surveillance program. It is not  intended to diagnose MRSA infection nor to guide or monitor treatment for MRSA infections. Test performance is not FDA approved in patients less than 68 years old. Performed at [facility], [address], [place], [place] [address]    Time coordinating discharge: 35 minutes  SIGNED: Mennie LAMY, MD  [facility] 01/19/2024, 2:43 PM  If 7PM-7AM, please contact night-coverage [email or web address]

## 2024-01-19 NOTE — Anesthesia Postprocedure Evaluation (Signed)
 Anesthesia Post Note  Patient: Ian Hart  Procedure(s) Performed: EGD (ESOPHAGOGASTRODUODENOSCOPY)     Patient location during evaluation: Endoscopy Anesthesia Type: MAC Level of consciousness: awake and alert Pain management: pain level controlled Vital Signs Assessment: post-procedure vital signs reviewed and stable Respiratory status: spontaneous breathing, nonlabored ventilation, respiratory function stable and patient connected to nasal cannula oxygen Cardiovascular status: blood pressure returned to baseline and stable Postop Assessment: no apparent nausea or vomiting Anesthetic complications: no   No notable events documented.  Last Vitals:  Vitals:   01/19/24 1430 01/19/24 1435  BP: (!) 101/55   Pulse: 92 87  Resp: 20 20  Temp:    SpO2: 95% 97%    Last Pain:  Vitals:   01/19/24 1430  TempSrc:   PainSc: 0-No pain   Pain Goal: Patients Stated Pain Goal: 0 (01/19/24 1245)                 [name]

## 2024-01-19 NOTE — Progress Notes (Addendum)
 Patient is on for EGD later this afternoon.  He is feeling better and hopeful that if all goes well during the procedure that maybe he can go home later today.  Labs stable with Hgb 8.3 grams.  GI ATTENDING  As above. EGD today to rule out any significant upper GI mucosal lesion that may have led to transient GI bleeding.  [name]. Abran Raddle., M.D. [facility] Division of Gastroenterology

## 2024-01-19 NOTE — Anesthesia Procedure Notes (Signed)
 Procedure Name: MAC Date/Time: 01/19/2024 1:53 PM  Performed by: Nada Corean CROME, CRNAPre-anesthesia Checklist: Patient identified, Emergency Drugs available, Suction available, Patient being monitored and Timeout performed Patient Re-evaluated:Patient Re-evaluated prior to induction Oxygen Delivery Method: Simple face mask Preoxygenation: Pre-oxygenation with 100% oxygen Induction Type: IV induction Placement Confirmation: positive ETCO2 Dental Injury: Teeth and Oropharynx as per pre-operative assessment  Comments: POM mask used.

## 2024-01-19 NOTE — Progress Notes (Signed)
   01/19/24 1535  Spiritual Encounters  Type of Visit Initial  Care provided to: Patient  Referral source Chaplain assessment  Reason for visit Routine spiritual support   I visited with Mr. Ian Hart while rounding on unit.  [name] welcomed my visit. When prompted by question about [place] ball[place], he shared memories of growing up in [place] and ball games. This facilitated other life review around family, care giving, and loss of his two older brothers in last five years. [name] has spouse and adult son at home.  I provided compassionate presence and active listening. I offered relational support and facilitated some memory sharing and life reflection as well as briefly touching on role of faith. I offered gratitude for the chance to meet and share in his story. I let him know support remains available.  Idris Edmundson L. Delores HERO.Div

## 2024-01-19 NOTE — Anesthesia Preprocedure Evaluation (Signed)
 Anesthesia Evaluation  Patient identified by MRN, date of birth, ID band Patient awake    Reviewed: Allergy & Precautions, NPO status , Patient's Chart, lab work & pertinent test results  History of Anesthesia Complications Negative for: history of anesthetic complications  Airway Mallampati: II  TM Distance: >3 FB Neck ROM: Full    Dental  (+) Poor Dentition, Missing   Pulmonary neg pulmonary ROS, neg sleep apnea, neg COPD, Patient abstained from smoking.Not current smoker, former smoker   Pulmonary exam normal breath sounds clear to auscultation       Cardiovascular Exercise Tolerance: Good METS(-) hypertension(-) CAD and (-) Past MI negative cardio ROS (-) dysrhythmias  Rhythm:Regular Rate:Normal - Systolic murmurs    Neuro/Psych negative neurological ROS  negative psych ROS   GI/Hepatic ,neg GERD  ,,(+)     (-) substance abuse    Endo/Other  neg diabetes    Renal/GU negative Renal ROS     Musculoskeletal   Abdominal   Peds  Hematology  (+) Blood dyscrasia, anemia   Anesthesia Other Findings Past Medical History: No date: History of chicken pox 04/2010: History of shingles 02/2020: Lung cancer (HCC) 11/10/2011: Routine general medical examination at a health care  facility  Reproductive/Obstetrics                              Anesthesia Physical Anesthesia Plan  ASA: 2  Anesthesia Plan: MAC   Post-op Pain Management: Minimal or no pain anticipated   Induction: Intravenous  PONV Risk Score and Plan: 1 and Propofol  infusion, TIVA and Ondansetron   Airway Management Planned: Nasal Cannula  Additional Equipment: None  Intra-op Plan:   Post-operative Plan:   Informed Consent: I have reviewed the patients History and Physical, chart, labs and discussed the procedure including the risks, benefits and alternatives for the proposed anesthesia with the patient or authorized  representative who has indicated his/her understanding and acceptance.     Dental advisory given  Plan Discussed with: CRNA and Surgeon  Anesthesia Plan Comments: (Discussed risks of anesthesia with patient, including possibility of difficulty with spontaneous ventilation under anesthesia necessitating airway intervention, PONV, and rare risks such as cardiac or respiratory or neurological events, and allergic reactions. Discussed the role of CRNA in patient's perioperative care. Patient understands. Denies N/V)        Anesthesia Quick Evaluation

## 2024-01-19 NOTE — Transfer of Care (Signed)
 Immediate Anesthesia Transfer of Care Note  Patient: Ian Hart  Procedure(s) Performed: EGD (ESOPHAGOGASTRODUODENOSCOPY)  Patient Location: Endoscopy Unit  Anesthesia Type:MAC  Level of Consciousness: awake, alert , oriented, and patient cooperative  Airway & Oxygen Therapy: Patient Spontanous Breathing  Post-op Assessment: Report given to RN and Post -op Vital signs reviewed and stable  Post vital signs: Reviewed and stable  Last Vitals:  Vitals Value Taken Time  BP 105/52 01/19/24 14:15  Temp    Pulse 97 01/19/24 14:15  Resp 17 01/19/24 14:15  SpO2 98 % 01/19/24 14:15    Last Pain:  Vitals:   01/19/24 1245  TempSrc: Temporal  PainSc: 0-No pain      Patients Stated Pain Goal: 0 (01/19/24 1245)  Complications: No notable events documented.

## 2024-01-19 NOTE — Op Note (Signed)
 [facility] Patient Name: Ian Hart Procedure Date: 01/19/2024 MRN: [id number] Attending MD: [name]. Abran , MD, [id number] Date of Birth: [birth year] CSN: [id number] Age: 73 Admit Type: Inpatient Procedure:                Upper GI endoscopy with biopsies Indications:              Acute post hemorrhagic anemia, Melena Providers:                [name]. Abran, MD, Hoy Penner, RN, Coye Bade, Technician Referring MD:             [facility] hospitalist Medicines:                Monitored Anesthesia Care Complications:            No immediate complications. Estimated Blood Loss:     Estimated blood loss: none. Procedure:                Pre-Anesthesia Assessment:                           - Prior to the procedure, a History and Physical                            was performed, and patient medications and                            allergies were reviewed. The patient's tolerance of                            previous anesthesia was also reviewed. The risks                            and benefits of the procedure and the sedation                            options and risks were discussed with the patient.                            All questions were answered, and informed consent                            was obtained. Prior Anticoagulants: The patient has                            taken heparin , last dose was 1 day prior to                            procedure. ASA Grade Assessment: IV - A patient                            with severe systemic disease that is a constant  threat to life. After reviewing the risks and                            benefits, the patient was deemed in satisfactory                            condition to undergo the procedure.                           After obtaining informed consent, the endoscope was                            passed under direct vision. Throughout the                             procedure, the patient's blood pressure, pulse, and                            oxygen saturations were monitored continuously. The                            [id number]-H190 ([id number]) Olympus endoscope was introduced                            through the mouth, and advanced to the second part                            of duodenum. The upper GI endoscopy was                            accomplished without difficulty. The patient                            tolerated the procedure well. Scope In: Scope Out: Findings:      The esophagus was normal.      One non-bleeding cratered gastric ulcer with no stigmata of bleeding was       found at the pylorus. The lesion was 10 mm in largest dimension.       Biopsies were taken from the gastric antrum with a cold forceps for       histology, to rule out H. pylori.      The stomach was otherwise normal.      The examined duodenum revealed multiple small nonbleeding AVMs in the       bulb and second portion.      The cardia and gastric fundus were normal on retroflexion. Impression:               1. Recent GI bleed secondary to pyloric channel                            ulcer. No active bleeding or stigmata. Biopsies                            taken to rule out H. pylori.  2. Incidental diminutive duodenal AVMs                           3. Otherwise unremarkable EGD Moderate Sedation:      none Recommendation:           1. Resume diet                           2. Pantoprazole 40 mg p.o. twice daily x 4 weeks                            then 40 mg p.o. daily indefinitely                           3. We will follow-up biopsies                           4. Okay for discharge from GI perspective. No                            outpatient GI follow-up required.                           Findings discussed with patient. He was provided a                            copy of this report. GI will sign off. Procedure  Code(s):        --- Professional ---                           [phone], Esophagogastroduodenoscopy, flexible,                            transoral; with biopsy, single or multiple Diagnosis Code(s):        --- Professional ---                           K25.9, Gastric ulcer, unspecified as acute or                            chronic, without hemorrhage or perforation                           D62, Acute posthemorrhagic anemia                           K92.1, Melena (includes Hematochezia) CPT copyright 2022 American Medical Association. All rights reserved. The codes documented in this report are preliminary and upon coder review may  be revised to meet current compliance requirements. [name]. Abran, MD 01/19/2024 2:21:46 PM This report has been signed electronically. Number of Addenda: 0

## 2024-01-19 NOTE — Plan of Care (Signed)
  Problem: Education: Goal: Knowledge of General Education information will improve Description: Including pain rating scale, medication(s)/side effects and non-pharmacologic comfort measures Outcome: Progressing   Problem: Health Behavior/Discharge Planning: Goal: Ability to manage health-related needs will improve Outcome: Progressing   Problem: Clinical Measurements: Goal: Ability to maintain clinical measurements within normal limits will improve Outcome: Progressing Goal: Will remain free from infection Outcome: Progressing Goal: Respiratory complications will improve Outcome: Progressing Goal: Cardiovascular complication will be avoided Outcome: Progressing   Problem: Activity: Goal: Risk for activity intolerance will decrease Outcome: Progressing   Problem: Nutrition: Goal: Adequate nutrition will be maintained Outcome: Progressing   Problem: Coping: Goal: Level of anxiety will decrease Outcome: Progressing   Problem: Elimination: Goal: Will not experience complications related to bowel motility Outcome: Progressing Goal: Will not experience complications related to urinary retention Outcome: Progressing   Problem: Pain Managment: Goal: General experience of comfort will improve and/or be controlled Outcome: Progressing   Problem: Safety: Goal: Ability to remain free from injury will improve Outcome: Progressing   Problem: Skin Integrity: Goal: Risk for impaired skin integrity will decrease Outcome: Progressing   Problem: Activity: Goal: Ability to tolerate increased activity will improve Outcome: Progressing   Problem: Clinical Measurements: Goal: Ability to maintain a body temperature in the normal range will improve Outcome: Progressing   Problem: Respiratory: Goal: Ability to maintain adequate ventilation will improve Outcome: Progressing Goal: Ability to maintain a clear airway will improve Outcome: Progressing

## 2024-01-19 NOTE — Plan of Care (Signed)

## 2024-01-19 NOTE — Progress Notes (Signed)
 AVS reviewed with patient, no questions. IV Team consult to deaccess port. Medications delivered in a secure bag to bedside and left with patient. Wife is headed to the hospital.

## 2024-01-20 ENCOUNTER — Telehealth: Payer: Self-pay

## 2024-01-20 ENCOUNTER — Other Ambulatory Visit

## 2024-01-20 ENCOUNTER — Inpatient Hospital Stay: Admitting: Physician Assistant

## 2024-01-20 ENCOUNTER — Other Ambulatory Visit: Payer: Self-pay | Admitting: Physician Assistant

## 2024-01-20 DIAGNOSIS — C642 Malignant neoplasm of left kidney, except renal pelvis: Secondary | ICD-10-CM

## 2024-01-20 LAB — CULTURE, BLOOD (ROUTINE X 2)
Culture: NO GROWTH
Culture: NO GROWTH
Special Requests: ADEQUATE

## 2024-01-20 NOTE — Telephone Encounter (Signed)
 Spoke with patient regarding upcoming appointments. Per the providers, patient's appointments and treatment will be deferred to next week due to recent hospitalization. Informed him that the scheduling department will contact him with the new appointment details. Schedule message has been sent. Patient voiced understanding and expressed thanks.

## 2024-01-20 NOTE — Transitions of Care (Post Inpatient/ED Visit) (Signed)
 01/20/2024  Name: Ian Hart MRN: [id number] DOB: November 13, 1950  Today's TOC FU Call Status: Today's TOC FU Call Status:: Successful TOC FU Call Completed TOC FU Call Complete Date: 01/20/24 Patient's Name and Date of Birth confirmed.  Transition Care Management Follow-up Telephone Call Date of Discharge: 01/19/24 Discharge Facility: [facility]) Type of Discharge: Inpatient Admission Primary Inpatient Discharge Diagnosis:: pneumonia How have you been since you were released from the hospital?: Better Any questions or concerns?: No  Items Reviewed: Did you receive and understand the discharge instructions provided?: Yes Medications obtained,verified, and reconciled?: Yes (Medications Reviewed) Any new allergies since your discharge?: No Dietary orders reviewed?: Yes Do you have support at home?: Yes People in Home [RPT]: spouse  Medications Reviewed Today: Medications Reviewed Today     Reviewed by Emmitt Pan, LPN (Licensed Practical Nurse) on 01/20/24 at 1048  Med List Status: <None>   Medication Order Taking? Sig Documenting Provider Last Dose Status Informant  alendronate  (FOSAMAX ) 70 MG tablet [id number] Yes Take 1 tablet (70 mg total) by mouth every 7 (seven) days. Take with a full glass of water on an empty stomach.  Patient taking differently: Take 70 mg by mouth every Friday. Take with a full glass of water on an empty stomach.   [name], NP  Active Multiple Informants  Calcium Carbonate-Vit D-Min (CALTRATE 600+D PLUS MINERALS) 600-800 MG-UNIT TABS [id number] Yes Take 1 tablet by mouth in the morning and at bedtime.  Patient taking differently: Take 1 tablet by mouth daily with breakfast.   O'Sullivan, Melissa, NP  Active Spouse/Significant Other  cefadroxil (DURICEF) 500 MG capsule [id number] Yes Take 1 capsule (500 mg total) by mouth 2 (two) times daily for 3 days. Christobal Guadalajara, MD  Active   CLARITIN 10 MG tablet [id number] Yes Take 10 mg by mouth  in the morning. [provider]  Active Spouse/Significant Other  lidocaine -prilocaine  (EMLA ) cream [id number] Yes Apply to affected area once  Patient taking differently: Apply 1 Application topically as needed (for port access).   Sherrod Sherrod, MD  Active Spouse/Significant Other  LORazepam  (ATIVAN ) 0.5 MG tablet [id number] Yes 1 tab po 30 minutes prior to radiation or MRI scans  Patient taking differently: Take 0.5 mg by mouth See admin instructions. Take 0.5 mg by mouth 30 minutes prior to radiation or MRI scans   Lanell Donald Stagger, PA-C  Active Spouse/Significant Other  Multiple Vitamin (MULTIVITAMIN) tablet [id number] Yes Take 1 tablet by mouth daily with breakfast. [provider]  Active Spouse/Significant Other  ondansetron  (ZOFRAN ) 8 MG tablet [id number] Yes Take 1 tablet (8 mg total) by mouth every 8 (eight) hours as needed for nausea or vomiting. Sherrod Sherrod, MD  Active Spouse/Significant Other  pantoprazole (PROTONIX) 40 MG tablet [id number] Yes Take 1 tablet by mouth twice daily for 4 weeks then take 1 tablet daily Kc, Ramesh, MD  Active   prochlorperazine  (COMPAZINE ) 10 MG tablet [id number] Yes Take 1 tablet (10 mg total) by mouth every 6 (six) hours as needed for nausea or vomiting. Sherrod Sherrod, MD  Active Spouse/Significant Other  tamsulosin  (FLOMAX ) 0.4 MG CAPS capsule [id number] Yes Take 1 capsule (0.4 mg total) by mouth daily. [name], NP  Active Spouse/Significant Other  TYLENOL  500 MG tablet [id number] Yes Take 500-1,000 mg by mouth every 6 (six) hours as needed (for pain). [provider]  Active Spouse/Significant Other  Zoster Vaccine Adjuvanted (SHINGRIX ) injection [id number]  0.54ml IM now and again in 2-6 months  Patient not  taking: Reported on 01/20/2024   [name], NP  Active Spouse/Significant Other            Home Care and Equipment/Supplies: Were Home Health Services Ordered?: NA Any new equipment or  medical supplies ordered?: NA  Functional Questionnaire: Do you need assistance with bathing/showering or dressing?: No Do you need assistance with meal preparation?: No Do you need assistance with eating?: No Do you have difficulty maintaining continence: No Do you need assistance with getting out of bed/getting out of a chair/moving?: No Do you have difficulty managing or taking your medications?: No  Follow up appointments reviewed: PCP Follow-up appointment confirmed?: Yes Date of PCP follow-up appointment?: 01/27/24 Follow-up Provider: [facility] Follow-up appointment confirmed?: Yes Date of Specialist follow-up appointment?: 01/27/24 Follow-Up Specialty Provider:: onco Do you need transportation to your follow-up appointment?: No Do you understand care options if your condition(s) worsen?: Yes-patient verbalized understanding    SIGNATURE Julian Lemmings, LPN [facility] Nurse Health Advisor Direct Dial [phone]

## 2024-01-21 ENCOUNTER — Ambulatory Visit: Payer: Self-pay | Admitting: Internal Medicine

## 2024-01-21 ENCOUNTER — Inpatient Hospital Stay

## 2024-01-21 ENCOUNTER — Encounter (HOSPITAL_COMMUNITY): Payer: Self-pay | Admitting: Internal Medicine

## 2024-01-21 LAB — SURGICAL PATHOLOGY

## 2024-01-22 ENCOUNTER — Telehealth: Payer: Self-pay | Admitting: Internal Medicine

## 2024-01-22 NOTE — Telephone Encounter (Signed)
 Scheduled appointments with the patient and he is aware of the changes made to his schedule.

## 2024-01-27 ENCOUNTER — Inpatient Hospital Stay

## 2024-01-27 ENCOUNTER — Ambulatory Visit: Admitting: Family

## 2024-01-27 ENCOUNTER — Telehealth: Payer: Self-pay | Admitting: Medical Oncology

## 2024-01-27 ENCOUNTER — Inpatient Hospital Stay: Admitting: Internal Medicine

## 2024-01-27 VITALS — BP 97/58 | HR 92 | Temp 98.6°F | Resp 16 | Ht 73.0 in | Wt 116.0 lb

## 2024-01-27 DIAGNOSIS — K250 Acute gastric ulcer with hemorrhage: Secondary | ICD-10-CM

## 2024-01-27 DIAGNOSIS — Z8719 Personal history of other diseases of the digestive system: Secondary | ICD-10-CM | POA: Diagnosis not present

## 2024-01-27 DIAGNOSIS — Z8701 Personal history of pneumonia (recurrent): Secondary | ICD-10-CM

## 2024-01-27 DIAGNOSIS — J189 Pneumonia, unspecified organism: Secondary | ICD-10-CM

## 2024-01-27 DIAGNOSIS — M810 Age-related osteoporosis without current pathological fracture: Secondary | ICD-10-CM

## 2024-01-27 DIAGNOSIS — K529 Noninfective gastroenteritis and colitis, unspecified: Secondary | ICD-10-CM

## 2024-01-27 NOTE — Telephone Encounter (Signed)
 LVM re pt not at appointment.

## 2024-01-27 NOTE — Progress Notes (Unsigned)
 b  Subjective:     Patient ID: Ian Hart, male    DOB: 1950/11/02, 73 y.o.   MRN: [id number]  Chief Complaint  Patient presents with   Hospitalization Follow-up    HPI  Discussed the use of AI scribe software for clinical note transcription with the patient, who gave verbal consent to proceed.  History of Present Illness  Ian Hart is a 73 year old male with history of metastic renal cell carcinoma and history of lung cancer who presents for follow-up after hospitalization.  He initially presented to ED from Oncology visit on 10/7 for low blood pressure and diarrhea. He was ultimately diagnosed with Pneumonia/sepsis and acute GI bleed with a hemoglobin down to 7.8.  He was discharged home on 10/7/   He experienced diarrhea for six weeks following recent treatment with Yervoy /Opdivo . Stool panel was negative during his hospitalizations. Diarrhea  has now resolved, though bowel movements remain abnormal. During hospitalization, he was treated for low blood pressure and pneumonia. An upper endoscopy showed multiple small non-bleeding AVMs and a small ulcer, with biopsies indicating inflammation. Currently, his breathing is stable, and he has no fever.       Health Maintenance Due  Topic Date Due   Zoster Vaccines- Shingrix  (1 of 2) 06/06/1969   DTaP/Tdap/Td (2 - Td or Tdap) 11/09/2021   COVID-19 Vaccine (6 - 2025-26 season) 12/14/2023    Past Medical History:  Diagnosis Date   History of chicken pox    History of shingles 04/2010   Lung cancer (HCC) 02/2020   Routine general medical examination at a health care facility 11/10/2011    Past Surgical History:  Procedure Laterality Date   BRONCHIAL BIOPSY  03/22/2020   Procedure: BRONCHIAL BIOPSIES;  Surgeon: Brenna Adine CROME, DO;  Location: [facility];  Service: Pulmonary;;   BRONCHIAL BRUSHINGS  03/22/2020   Procedure: BRONCHIAL BRUSHINGS;  Surgeon: Brenna Adine CROME, DO;  Location: [facility];  Service:  Pulmonary;;   BRONCHIAL NEEDLE ASPIRATION BIOPSY  03/22/2020   Procedure: BRONCHIAL NEEDLE ASPIRATION BIOPSIES;  Surgeon: Brenna Adine CROME, DO;  Location: [facility];  Service: Pulmonary;;   BRONCHIAL WASHINGS  03/22/2020   Procedure: BRONCHIAL WASHINGS;  Surgeon: Brenna Adine CROME, DO;  Location: [facility];  Service: Pulmonary;;   ESOPHAGOGASTRODUODENOSCOPY N/A 01/19/2024   Procedure: EGD (ESOPHAGOGASTRODUODENOSCOPY);  Surgeon: Abran [name], MD;  Location: [facility];  Service: Gastroenterology;  Laterality: N/A;   FIDUCIAL MARKER PLACEMENT  03/22/2020   Procedure: FIDUCIAL MARKER PLACEMENT;  Surgeon: Brenna Adine CROME, DO;  Location: [facility];  Service: Pulmonary;;   IR IMAGING GUIDED PORT INSERTION  11/05/2023   IR LUMBAR PUNCTURE  11/05/2023   ROBOTIC ASSITED PARTIAL NEPHRECTOMY Left 2022   VIDEO BRONCHOSCOPY WITH ENDOBRONCHIAL NAVIGATION N/A 03/22/2020   Procedure: VIDEO BRONCHOSCOPY WITH ENDOBRONCHIAL NAVIGATION;  Surgeon: Brenna Adine CROME, DO;  Location: [facility];  Service: Pulmonary;  Laterality: N/A;   VIDEO BRONCHOSCOPY WITH ENDOBRONCHIAL ULTRASOUND N/A 03/22/2020   Procedure: VIDEO BRONCHOSCOPY WITH ENDOBRONCHIAL ULTRASOUND;  Surgeon: Brenna Adine CROME, DO;  Location: [facility];  Service: Pulmonary;  Laterality: N/A;   VIDEO BRONCHOSCOPY WITH ENDOBRONCHIAL ULTRASOUND Bilateral 11/10/2023   Procedure: BRONCHOSCOPY, WITH EBUS;  Surgeon: Shelah Lamar RAMAN, MD;  Location: [facility];  Service: Cardiopulmonary;  Laterality: Bilateral;    Family History  Problem Relation Age of Onset   Sudden death Mother    Rheumatic fever Mother    COPD Brother    Liver disease Neg Hx  Colon cancer Neg Hx    Esophageal cancer Neg Hx     Social History   Socioeconomic History   Marital status: Married    Spouse name: Not on file   Number of children: 1   Years of education: Not on file   Highest education level: Associate degree: academic program  Occupational History    Occupation: retired  Tobacco Use   Smoking status: Former    Current packs/day: 0.00    Average packs/day: 2.0 packs/day for 50.0 years (100.0 ttl pk-yrs)    Types: Cigarettes    Quit date: 07/13/2021    Years since quitting: 2.5   Smokeless tobacco: Never   Tobacco comments:    07/25/20  Vaping Use   Vaping status: Never Used  Substance and Sexual Activity   Alcohol use: Yes    Alcohol/week: 10.0 standard drinks of alcohol    Types: 10 Standard drinks or equivalent per week    Comment: 2 drinks per night   Drug use: Not Currently   Sexual activity: Yes  Other Topics Concern   Not on file  Social History Narrative   Regular exercise:  2-3 x weekly (sports, yardwork)   Caffeine use:  3 cups coffee daily   28 yr old son   Wife   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           Social Drivers of Corporate investment banker Strain: Low Risk  (08/07/2023)   Overall Financial Resource Strain (CARDIA)    Difficulty of Paying Living Expenses: Not hard at all  Food Insecurity: No Food Insecurity (01/15/2024)   Hunger Vital Sign    Worried About Running Out of Food in the Last Year: Never true    Ran Out of Food in the Last Year: Never true  Transportation Needs: No Transportation Needs (01/15/2024)   PRAPARE - Administrator, Civil Service (Medical): No    Lack of Transportation (Non-Medical): No  Physical Activity: Unknown (10/05/2023)   Exercise Vital Sign    Days of Exercise per Week: 6 days    Minutes of Exercise per Session: Not on file  Stress: Stress Concern Present (08/07/2023)   [employer] of Occupational Health - Occupational Stress Questionnaire    Feeling of Stress : To some extent  Social Connections: Socially Integrated (01/15/2024)   Social Connection and Isolation Panel    Frequency of Communication with Friends and Family: Three times a week    Frequency of Social Gatherings with Friends and Family:  Once a week    Attends Religious Services: More than 4 times per year    Active Member of [employer] or Organizations: Yes    Attends Engineer, structural: More than 4 times per year    Marital Status: Married  Catering manager Violence: Not At Risk (01/15/2024)   Humiliation, Afraid, Rape, and Kick questionnaire    Fear of Current or Ex-Partner: No    Emotionally Abused: No    Physically Abused: No    Sexually Abused: No    Outpatient Medications Prior to Visit  Medication Sig Dispense Refill   alendronate  (FOSAMAX ) 70 MG tablet Take 1 tablet (70 mg total) by mouth every 7 (seven) days. Take with a full glass of water on an empty stomach. (Patient taking differently: Take 70 mg by mouth every Friday. Take with a full glass of water on an empty stomach.) 12 tablet  4   Calcium Carbonate-Vit D-Min (CALTRATE 600+D PLUS MINERALS) 600-800 MG-UNIT TABS Take 1 tablet by mouth in the morning and at bedtime. (Patient taking differently: Take 1 tablet by mouth daily with breakfast.)     CLARITIN 10 MG tablet Take 10 mg by mouth in the morning.     lidocaine -prilocaine  (EMLA ) cream Apply to affected area once (Patient taking differently: Apply 1 Application topically as needed (for port access).) 30 g 3   LORazepam  (ATIVAN ) 0.5 MG tablet 1 tab po 30 minutes prior to radiation or MRI scans (Patient taking differently: Take 0.5 mg by mouth See admin instructions. Take 0.5 mg by mouth 30 minutes prior to radiation or MRI scans) 10 tablet 0   Multiple Vitamin (MULTIVITAMIN) tablet Take 1 tablet by mouth daily with breakfast.     ondansetron  (ZOFRAN ) 8 MG tablet Take 1 tablet (8 mg total) by mouth every 8 (eight) hours as needed for nausea or vomiting. 30 tablet 1   pantoprazole (PROTONIX) 40 MG tablet Take 1 tablet by mouth twice daily for 4 weeks then take 1 tablet daily 60 tablet 1   prochlorperazine  (COMPAZINE ) 10 MG tablet Take 1 tablet (10 mg total) by mouth every 6 (six) hours as needed for nausea  or vomiting. 30 tablet 1   tamsulosin  (FLOMAX ) 0.4 MG CAPS capsule Take 1 capsule (0.4 mg total) by mouth daily. 90 capsule 1   TYLENOL  500 MG tablet Take 500-1,000 mg by mouth every 6 (six) hours as needed (for pain).     Zoster Vaccine Adjuvanted (SHINGRIX ) injection 0.5ml IM now and again in 2-6 months (Patient not taking: Reported on 01/20/2024) 0.5 mL 1   No facility-administered medications prior to visit.    Allergies  Allergen Reactions   Acyclovir And Related Rash and Other (See Comments)    Rash that looked like chicken pox    ROS See HPI    Objective:    Physical Exam Constitutional:      General: He is not in acute distress.    Appearance: He is well-developed. He is cachectic.  HENT:     Head: Normocephalic and atraumatic.  Cardiovascular:     Rate and Rhythm: Normal rate and regular rhythm.     Heart sounds: No murmur heard. Pulmonary:     Effort: Pulmonary effort is normal. No respiratory distress.     Breath sounds: Normal breath sounds. No wheezing or rales.  Skin:    General: Skin is warm and dry.  Neurological:     Mental Status: He is alert and oriented to person, place, and time.  Psychiatric:        Behavior: Behavior normal.        Thought Content: Thought content normal.      BP (!) 97/58 (BP Location: Right Arm, Patient Position: Sitting, Cuff Size: Small)   Pulse 92   Temp 98.6 F (37 C) (Oral)   Resp 16   Ht 6' 1 (1.854 m)   Wt 116 lb (52.6 kg)   SpO2 100%   BMI 15.30 kg/m  Wt Readings from Last 3 Encounters:  01/27/24 116 lb (52.6 kg)  01/19/24 110 lb (49.9 kg)  01/15/24 105 lb 5 oz (47.8 kg)       Assessment & Plan:   Problem List Items Addressed This Visit       Unprioritized   Osteoporosis   Continue fosamax  and caltrate       Chronic diarrhea   Resolved.  CAP (community acquired pneumonia) - Primary   Clinically resolved.  Advised him to return to imaging first week of November to complete a follow up CXR.         Relevant Orders   Comp Met (CMET) (Completed)   CBC w/Diff (Completed)   DG Chest 2 View   Acute gastric ulcer with hemorrhage   Resolved.  Update CBC.        I am having Ian Hart. Dahms maintain his multivitamin, Shingrix , LORazepam , alendronate , Caltrate 600+D Plus Minerals, tamsulosin , prochlorperazine , ondansetron , lidocaine -prilocaine , Claritin, TYLENOL , and pantoprazole.  No orders of the defined types were placed in this encounter.

## 2024-01-27 NOTE — Telephone Encounter (Signed)
 Pt told wife he lvm cancelling  his appt today . She said he told her  I still don't feel strong enough to go through it. She said he is not normal ,but better.

## 2024-01-28 ENCOUNTER — Inpatient Hospital Stay: Attending: Radiation Oncology

## 2024-01-28 ENCOUNTER — Inpatient Hospital Stay

## 2024-01-28 ENCOUNTER — Ambulatory Visit: Payer: Self-pay | Admitting: Family

## 2024-01-28 ENCOUNTER — Encounter: Payer: Self-pay | Admitting: Internal Medicine

## 2024-01-28 ENCOUNTER — Inpatient Hospital Stay: Admitting: Internal Medicine

## 2024-01-28 LAB — CBC WITH DIFFERENTIAL/PLATELET
Basophils Absolute: 0 K/uL (ref 0.0–0.1)
Basophils Relative: 0.9 % (ref 0.0–3.0)
Eosinophils Absolute: 0.2 K/uL (ref 0.0–0.7)
Eosinophils Relative: 5.6 % — ABNORMAL HIGH (ref 0.0–5.0)
HCT: 24.9 % — ABNORMAL LOW (ref 39.0–52.0)
Hemoglobin: 8.5 g/dL — ABNORMAL LOW (ref 13.0–17.0)
Lymphocytes Relative: 9.5 % — ABNORMAL LOW (ref 12.0–46.0)
Lymphs Abs: 0.4 K/uL — ABNORMAL LOW (ref 0.7–4.0)
MCHC: 34.3 g/dL (ref 30.0–36.0)
MCV: 92.5 fl (ref 78.0–100.0)
Monocytes Absolute: 0.4 K/uL (ref 0.1–1.0)
Monocytes Relative: 10 % (ref 3.0–12.0)
Neutro Abs: 3.1 K/uL (ref 1.4–7.7)
Neutrophils Relative %: 74 % (ref 43.0–77.0)
Platelets: 269 K/uL (ref 150.0–400.0)
RBC: 2.69 Mil/uL — ABNORMAL LOW (ref 4.22–5.81)
RDW: 14.7 % (ref 11.5–15.5)
WBC: 4.2 K/uL (ref 4.0–10.5)

## 2024-01-28 LAB — COMPREHENSIVE METABOLIC PANEL WITH GFR
ALT: 15 U/L (ref 0–53)
AST: 16 U/L (ref 0–37)
Albumin: 3.6 g/dL (ref 3.5–5.2)
Alkaline Phosphatase: 73 U/L (ref 39–117)
BUN: 16 mg/dL (ref 6–23)
CO2: 26 meq/L (ref 19–32)
Calcium: 8.6 mg/dL (ref 8.4–10.5)
Chloride: 98 meq/L (ref 96–112)
Creatinine, Ser: 1.02 mg/dL (ref 0.40–1.50)
GFR: 72.84 mL/min (ref >60.00)
Glucose, Bld: 82 mg/dL (ref 70–99)
Potassium: 4.4 meq/L (ref 3.5–5.1)
Sodium: 133 meq/L — ABNORMAL LOW (ref 135–145)
Total Bilirubin: 0.4 mg/dL (ref 0.2–1.2)
Total Protein: 6.2 g/dL (ref 6.0–8.3)

## 2024-01-28 NOTE — Assessment & Plan Note (Signed)
Continue fosamax and caltrate. 

## 2024-01-28 NOTE — Assessment & Plan Note (Signed)
 Clinically resolved.  Advised him to return to imaging first week of November to complete a follow up CXR.

## 2024-01-28 NOTE — Assessment & Plan Note (Signed)
 Resolved.  Update CBC.

## 2024-01-28 NOTE — Assessment & Plan Note (Signed)
 Resolved

## 2024-01-28 NOTE — Patient Instructions (Signed)
 Please return to imaging the first week of November to complete a follow up chest x ray.

## 2024-01-29 ENCOUNTER — Telehealth: Payer: Self-pay

## 2024-01-29 NOTE — Telephone Encounter (Signed)
 Patient called and wants to reschedule his missed appts from this week.  Message sent to scheduling team. Informed patient that someone from scheduling will call with new appts.  He voiced understanding.

## 2024-02-04 NOTE — Progress Notes (Signed)
 Follow up call to receive results from MRI of lumbar and thoracic spine on 02/11/24 and cervical spine and brain on 10/28.   No pain or other complaints today per patient.  Clinical data: Secondary malignant neoplasm of brain and spinal cord (HCC)   Brain: MPRESSION: 1. Mixed response to interval therapy with overall improvement of metastatic disease in the cerebellar hemispheres and marginal worsening in the cerebral hemispheres. 2. New metastatic lesion within the right occipital lobe. 3. Increased size of the ring-enhancing lesion within the right temporal lobe from 4 mm to 6 mm. 4. Decreased size of the lesion within the left occipital lobe, which is no longer ring-enhancing.  C-spine: IMPRESSION: 1. No evidence of metastatic disease to the cervical spine. 2. Enhancing lesion again seen posteriorly within the T2 vertebral body, concerning for possible metastasis; correlation with clinical history and consideration of targeted evaluation recommended. 3. Mild disc space narrowing and uncovertebral joint hypertrophy at C4-5 and C5-6 with mild central spinal canal stenosis and bilateral neural foraminal stenosis at each level. L-spine: IMPRESSION: 1. Unchanged 4 x 2 mm focus of contrast enhancement along the dorsal conus medullaris at L1. 2. 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 3. 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 Mild left L3L4 foraminal stenosis from asymmetric disc bulge, without spinal canal stenosis. 5. Mild right L5L6 foraminal stenosis from disc bulge with endplate spurring. T-spine: IMPRESSION:  1. Unchanged 4 x 2 mm focus of contrast enhancement along the dorsal conus  medullaris at L1.  2. 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  3. 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 Mild left L3L4 foraminal stenosis from asymmetric disc  bulge, without  spinal canal stenosis.  5. Mild right L5L6 foraminal stenosis from disc bulge with endplate spurring.

## 2024-02-08 ENCOUNTER — Encounter: Payer: Self-pay | Admitting: Family

## 2024-02-08 ENCOUNTER — Encounter: Payer: Self-pay | Admitting: Radiation Oncology

## 2024-02-08 ENCOUNTER — Telehealth: Payer: Self-pay | Admitting: *Deleted

## 2024-02-08 ENCOUNTER — Other Ambulatory Visit: Payer: Self-pay | Admitting: Physician Assistant

## 2024-02-08 DIAGNOSIS — R21 Rash and other nonspecific skin eruption: Secondary | ICD-10-CM

## 2024-02-08 MED ORDER — PREDNISONE 10 MG PO TABS: ORAL_TABLET | ORAL | 0 refills | Status: DC

## 2024-02-08 NOTE — Telephone Encounter (Signed)

## 2024-02-08 NOTE — Telephone Encounter (Signed)
 Received Hart from Sharene RAMAN, RN in [facility] that they received Ian Hart today that Ian Hart had itching on shoulder that has returned since discharge from hospital.   Per notes in chart, Ian Hart's PCP, Melissa S,NP, received Ian Hart today from Ian Hart regarding this same concern. She prescribed prednisone in decreasing doses to treat the itching.   Contacted Ian Hart at [facility], PA's request advise Ian Hart he can also use OTC hydrocortisone cream directly on rash and take Claritin/anti histamine for itching.  Ian Hart states he picked up prednisone rx and has started it. He stated he is already using hydrocortisone cream and taking an antihistamine. Advised him to contact his PCP if rash does not improve and itching does not decrease by end of prednisone rx, he should contact his PCP. He verbalized understanding

## 2024-02-09 ENCOUNTER — Ambulatory Visit (HOSPITAL_COMMUNITY)
Admission: RE | Admit: 2024-02-09 | Discharge: 2024-02-09 | Disposition: A | Discharge status: Home / Self Care | Source: Ambulatory Visit | Attending: Radiation Oncology | Admitting: Radiation Oncology

## 2024-02-09 ENCOUNTER — Encounter: Payer: Self-pay | Admitting: Physician Assistant

## 2024-02-09 DIAGNOSIS — C7931 Secondary malignant neoplasm of brain: Secondary | ICD-10-CM | POA: Insufficient documentation

## 2024-02-09 DIAGNOSIS — C7949 Secondary malignant neoplasm of other parts of nervous system: Secondary | ICD-10-CM | POA: Insufficient documentation

## 2024-02-09 MED ORDER — GADOBUTROL 1 MMOL/ML IV SOLN
5.0000 mL | Freq: Once | INTRAVENOUS | Status: AC | PRN
  Administered 2024-02-09: 5 mL via INTRAVENOUS

## 2024-02-09 MED ORDER — HEPARIN SOD (PORK) LOCK FLUSH 100 UNIT/ML IV SOLN
500.0000 [IU] | INTRAVENOUS | Status: AC | PRN
  Administered 2024-02-09: 500 [IU]

## 2024-02-11 ENCOUNTER — Ambulatory Visit (HOSPITAL_COMMUNITY)
Admission: RE | Admit: 2024-02-11 | Discharge: 2024-02-11 | Disposition: A | Source: Ambulatory Visit | Attending: Radiation Oncology | Admitting: Radiation Oncology

## 2024-02-11 ENCOUNTER — Ambulatory Visit (HOSPITAL_COMMUNITY)
Admission: RE | Admit: 2024-02-11 | Discharge: 2024-02-11 | Disposition: A | Discharge status: Home / Self Care | Source: Ambulatory Visit | Attending: Radiation Oncology | Admitting: Radiation Oncology

## 2024-02-11 DIAGNOSIS — C7931 Secondary malignant neoplasm of brain: Secondary | ICD-10-CM | POA: Insufficient documentation

## 2024-02-11 DIAGNOSIS — C7949 Secondary malignant neoplasm of other parts of nervous system: Secondary | ICD-10-CM | POA: Diagnosis present

## 2024-02-11 MED ORDER — GADOBUTROL 1 MMOL/ML IV SOLN
5.0000 mL | Freq: Once | INTRAVENOUS | Status: AC | PRN
  Administered 2024-02-11: 5 mL via INTRAVENOUS

## 2024-02-15 ENCOUNTER — Inpatient Hospital Stay: Attending: Radiation Oncology

## 2024-02-15 ENCOUNTER — Encounter: Payer: Self-pay | Admitting: Radiation Oncology

## 2024-02-15 ENCOUNTER — Ambulatory Visit
Admission: RE | Admit: 2024-02-15 | Discharge: 2024-02-15 | Disposition: A | Discharge status: Home / Self Care | Source: Ambulatory Visit | Attending: Radiation Oncology | Admitting: Radiation Oncology

## 2024-02-15 ENCOUNTER — Inpatient Hospital Stay

## 2024-02-15 DIAGNOSIS — Z87891 Personal history of nicotine dependence: Secondary | ICD-10-CM | POA: Insufficient documentation

## 2024-02-15 DIAGNOSIS — C7971 Secondary malignant neoplasm of right adrenal gland: Secondary | ICD-10-CM | POA: Insufficient documentation

## 2024-02-15 DIAGNOSIS — C642 Malignant neoplasm of left kidney, except renal pelvis: Secondary | ICD-10-CM | POA: Insufficient documentation

## 2024-02-15 DIAGNOSIS — C7931 Secondary malignant neoplasm of brain: Secondary | ICD-10-CM | POA: Insufficient documentation

## 2024-02-15 DIAGNOSIS — Z510 Encounter for antineoplastic radiation therapy: Secondary | ICD-10-CM | POA: Insufficient documentation

## 2024-02-15 DIAGNOSIS — Z5112 Encounter for antineoplastic immunotherapy: Secondary | ICD-10-CM | POA: Insufficient documentation

## 2024-02-15 DIAGNOSIS — C3411 Malignant neoplasm of upper lobe, right bronchus or lung: Secondary | ICD-10-CM | POA: Insufficient documentation

## 2024-02-15 DIAGNOSIS — Z79620 Long term (current) use of immunosuppressive biologic: Secondary | ICD-10-CM | POA: Insufficient documentation

## 2024-02-15 MED ORDER — HYDROXYZINE HCL 10 MG PO TABS: 10.0000 mg | ORAL_TABLET | Freq: Three times a day (TID) | ORAL | 1 refills | Status: AC | PRN

## 2024-02-15 NOTE — Progress Notes (Signed)
 Has armband been applied?  {yes no:314532}  Does patient have an allergy to IV contrast dye?: {yes no:314532}   Has patient ever received premedication for IV contrast dye?: {yes no:314532}   Does patient take metformin?: {yes no:314532}  If patient does take metformin when was the last dose: {Time; dates multiple:15870}  Date of lab work: 01/27/2024 BUN: 16 CR: 1.02 eGfr: 72.84  IV site: Chest Port  Has IV site been added to flowsheet?  {yes no:314532}  There were no vitals taken for this visit.

## 2024-02-16 ENCOUNTER — Inpatient Hospital Stay: Admitting: Dietician

## 2024-02-16 ENCOUNTER — Ambulatory Visit
Admission: RE | Admit: 2024-02-16 | Discharge: 2024-02-16 | Disposition: A | Discharge status: Home / Self Care | Source: Ambulatory Visit | Attending: Radiation Oncology | Admitting: Radiation Oncology

## 2024-02-16 DIAGNOSIS — C7931 Secondary malignant neoplasm of brain: Secondary | ICD-10-CM

## 2024-02-16 MED ORDER — SODIUM CHLORIDE 0.9% FLUSH
10.0000 mL | Freq: Once | INTRAVENOUS | Status: AC
  Administered 2024-02-16: 10 mL via INTRAVENOUS

## 2024-02-16 NOTE — Progress Notes (Signed)
 Radiation Oncology         [phone] ________________________________   Outpatient Follow Up - Conducted via telephone at patient request.  I spoke with the patient to conduct this visit via telephone. The patient was notified in advance and was offered an in person or telemedicine meeting to allow for face to face communication but instead preferred to proceed with a telephone visit.    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   HISTORY OF PRESENT ILLNESS: Ian Hart is a 73 y.o. 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 This was initially found on lung cancer screening scan, and he subsequently underwent a PET scan on 03/01/2020 which revealed hypermetabolism within the right upper lobe nodule as well as an isolated hypermetabolic subcarinal lymph nodes suspicious for nodal disease.  No other extrathoracic hypermetabolic metastases were identified, a left renal lesion which was indeterminate but hypermetabolic was also seen and possibly suspicious for renal cell carcinoma.  He underwent a CT super D scan on 03/22/2020 and subsequent bronchoscopy.  Interestingly bronchoscopy confirmed adenocarcinoma with in the lung nodule and small cell carcinoma within the 7 station lymph node.  He proceeded with chemoRT between 04/17/20 and 06/01/20. He also proceeded with prophylactic cranial irradiation to reduce risks of brain disease. 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  An MRI on 11/11/22 that showed punctate foci of enhancement in the left frontal lobe and left cerebellum but it was  felt these changes were vascular in nature and to closely follow with repeat MRI for any interval change. This repeat MRI on 01/12/23 showed an increase in the two lesions and 3 additional lesions so he proceeded with SRS to 5 PTV targets. He completed this in October 2024. He was followed by medical oncology and had suspicious findings in the RUL and he was offered reirradiaiton with ultrahypofractionated treatment and this was completed on 04/21/23.   His first post SRS MRI brain on 04/29/23 showed resolution of previously treated disease but about 7 new punctate lesions throughout the brain. There was fluid in his left greater than right mastoid region again, and he was treated with a steroid taper. His case had been reviewed in brain oncology conference, and it was felt that the punctate findings were not certain, so a repeat MRI was recommended a month later.  MRI brain on 05/27/23 showed two new lesions in the left cerebellum, each 2 mm in size, and a possible new lesion in the left cerebellum versus a vascular change. 10 stable lesions were noted ranging in size from 1.5 mm-3 mm, and 4 larger lesions including 2 mm in the right cerebellum, 3 mm in the posterolateral right temporal lobe, 3 mm in the posteriomedial right parietal lobe, and a 2-3 mm right superior frontal gyrus.  His scans were fused with his prior treatment plan, and essentially there were be up to 16 lesions that had not been treated.  He went on to receive stereotactic radiosurgery to the untreated areas of disease on 06/11/2023 and was doing quite well, his first posttreatment MRI  performed on 09/09/2023 unfortunately showed 7 new lesions ranging from 2 to 5 mm in size in both hemispheres involving the tentorium and infratentorial regions without visible edema, and the previously treated lesions are stable to smaller in size.  He received an additional course of SRS. He was seen back by Dr. Sherrod and had a restaging PET scan that showed  concerns for new mediastinal and left hilar adenopathy that was hypermetabolic as well as activity in the right adrenal gland, as well as new foci of disease in the thoracolumbar spinal canal and potentially along T6-4 pedicles.  His disease pattern was felt to be consistent with either recurrent lung cancer or progressive metastatic renal cell cancer so he underwent bronchoscopy with Dr. Shelah on 11/10/2023, cytology from this procedure was concerning for metastatic renal cell carcinoma.  He was counseled on systemic treatment and began dual immunotherapy on 11/25/2023.  In the midst of this MRI scans of his entire spine and brain were repeated in July 2025.  There were concerns for what appeared to be involvement of his T5 vertebral body as well as focal enhancement along the left aspect of his spinal cord at L1.  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  He has struggled with symptoms of diarrhea and immunotherapy mediated skin rash, he was recently hospitalized and has been on several courses of steroids to treat his colitis.  He had a CT scan while admitted on 01/15/2024 and this did not show any acute findings in the abdomen or pelvis.  Is routinely ordered MRI of the brain as well as MRI of the cervical thoracic and lumbar spine were performed on 02/09/24 and 02/11/24 his MRI of the brain was suspicious for at least 2 new areas of disease, and a mixed response with improvement in the cerebellum but marginal worsening in the cerebral hemispheres.  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  His case was discussed in multidisciplinary brain and spine oncology conference this morning.  A fusion was also requested of his prior treatment plan given the multifocal nature of his previously treated disease.  In this assessment, it actually appears that there are 6 lesions that have not been treated all subcentimeter throughout both hemispheres and left cerebellum.  Fortunately no progressive leptomeningeal disease was of concern and he is contacted by phone to discuss these results and to offer additional radiotherapy.  Of note because of his intolerance of immunotherapy, Dr. Sherrod plans to meet with him next week to decide about regimens, the patient is under the impression that he will continue with single agent immunotherapy as opposed to dual therapy.   On review of systems, the patient reports he is doing okay. He states he's gained back about 15 pounds in the last few weeks due to taking steroids for his colitis and skin issues. He is happy about gaining weight and has had significant improvement of his diarrhea. He denies any new symptoms of headaches, visual changes, or difficulty with movement  or hearing. He reports he is still having quite a bit of trouble with skin rash and has been using topical hydrocortisone cream which provides some relief but struggles still. The skin changes are itchy and uncomfortable and he has also tried pepcid , and claritin type products without relief. No other complaints are verbalized.   PREVIOUS RADIATION THERAPY:   11/17/23-12/08/23   Plan Name: Spine_L1 Site: Lumbar Spine Technique: 3D Mode: Photon Dose Per Fraction: 2.5 Gy Prescribed Dose (Delivered / Prescribed): 37.5 Gy / 37.5 Gy Prescribed Fxs (Delivered / Prescribed): 15 / 15   Plan Name: Spine_L5 Site: Lumbar Spine Technique: 3D Mode: Photon Dose Per Fraction: 2.5 Gy Prescribed Dose (Delivered /  Prescribed): 37.5 Gy / 37.5 Gy Prescribed Fxs (Delivered / Prescribed): 15 / 15   09/25/23-09/30/23 SRS Treatment Plan Name: Brain_SRS_6t Site: Brain PTV_22_CerebellarR_51mm PTV_23_CerebellarL_8mm PTV_24_CerebellarL_2.50mm PTV_25_CerebellarR_80mm PTV_26_OccipitalL_91mm  PTV_27_Occiptal_L_54mm  Technique: SBRT/SRT-IMRT Mode: Photon Dose Per Fraction: 9 Gy Prescribed Dose (Delivered / Prescribed): 27 Gy / 27 Gy Prescribed Fxs (Delivered / Prescribed): 3 / 3   Plan Name: Brain_SRS_dca Site: Brain PTV_28_ParietalL_48mm Technique: SBRT/SRT-3D Mode: Photon Dose Per Fraction: 9 Gy Prescribed Dose (Delivered / Prescribed): 27 Gy / 27 Gy Prescribed Fxs (Delivered / Prescribed): 3 / 3   06/11/23 SRS Treatment Plan Name: Brain_SRS Site: Brain PTV_6_ne1CerebellumL_post48mm PTV_7_ne2CerebellumL_16mm PTV_8_ne3CerebellumL_sup69mm PTV_9_lar1CerebellumR_20mm PTV_10_lar2TemporalR_post39mm PTV_11_lar3ParietalR_43mm PTV_12_lar4FrontalR_30mm PTV_13_sta1CerebellumL_inf54mm PTV_14_sta2CerebellumL_16mm PTV_15_sta3FrontalL_AntInf_76mm  PTV_16_sta4VentricleR_Ant_27mm PTV_17_sta5FrontalL_Ant_43mm PTV_18_sta6FrontoParL_73mm  PTV_19_sta7FrontalL_Ant_44mm PTV_20_sta9FrontalL_71mm  PTV_21_sta10ParietalL_14mm   Technique: SBRT/SRT-IMRT Mode: Photon Dose Per Fraction: 20 Gy Prescribed Dose (Delivered / Prescribed): 20 Gy / 20 Gy Prescribed Fxs (Delivered / Prescribed): 1 / 1    04/06/23-04/21/23 Ultrahypofractionated Radiation Plan Name: Lung_R_UHRT Site: Lung, RUL Technique: IMRT Mode: Photon Dose Per Fraction: 5 Gy Prescribed Dose (Delivered / Prescribed): 50 Gy / 50 Gy Prescribed Fxs (Delivered / Prescribed): 10 / 10   01/27/23-01/30/23: SRS Treatment   Plan Name: Brain_4_dca Site: Brain   PTV_4_PontineR_35mm Technique: SBRT/SRT-3D Mode: Photon Dose Per Fraction: 16 Gy Prescribed Dose (Delivered / Prescribed): 16 Gy / 16 Gy Prescribed Fxs (Delivered / Prescribed): 1 / 1  Plan Name:  Brain_5_dca Site: Brain PTV_1_FrontalL_2.8mm PTV__2_ParietalL_2.51mm PTV_3_FrontalL_2.66mm PTV_5_CerebellarL_1.63mm Technique: SBRT/SRT-3D Mode: Photon Dose Per Fraction: 20 Gy Prescribed Dose (Delivered / Prescribed): 20 Gy / 20 Gy Prescribed Fxs (Delivered / Prescribed): 1 / 1   08/20/2020 through 08/31/2020 Site Technique Total Dose (Gy) Dose per Fx (Gy) Completed Fx Beam Energies  Brain: Brain Complex 25/25 2.5 10/10 6X     Radiation Treatment Dates: 04/17/2020 through 06/01/2020 Site Technique Total Dose (Gy) Dose per Fx (Gy) Completed Fx Beam Energies  Lung, Right: Lung_Rt 3D 60/60 2 30/30 6X, 10X  Lung, Right: Lung_Rt_Bst 3D 6/6 2 3/3 6X, 10X     PAST MEDICAL HISTORY:  Past Medical History:  Diagnosis Date   History of chicken pox    History of shingles 04/2010   Lung cancer (HCC) 02/2020   Routine general medical examination at a health care facility 11/10/2011       PAST SURGICAL HISTORY: Past Surgical History:  Procedure Laterality Date   BRONCHIAL BIOPSY  03/22/2020   Procedure: BRONCHIAL BIOPSIES;  Surgeon: Brenna Adine CROME, DO;  Location: [facility];  Service: Pulmonary;;   BRONCHIAL BRUSHINGS  03/22/2020   Procedure: BRONCHIAL BRUSHINGS;  Surgeon: Brenna Adine CROME, DO;  Location: [facility];  Service: Pulmonary;;   BRONCHIAL NEEDLE ASPIRATION BIOPSY  03/22/2020   Procedure: BRONCHIAL NEEDLE ASPIRATION BIOPSIES;  Surgeon: Brenna Adine CROME, DO;  Location: [facility];  Service: Pulmonary;;   BRONCHIAL WASHINGS  03/22/2020   Procedure: BRONCHIAL WASHINGS;  Surgeon: Brenna Adine CROME, DO;  Location: [facility];  Service: Pulmonary;;   ESOPHAGOGASTRODUODENOSCOPY N/A 01/19/2024   Procedure: EGD (ESOPHAGOGASTRODUODENOSCOPY);  Surgeon: Abran [name], MD;  Location: [facility];  Service: Gastroenterology;  Laterality: N/A;   FIDUCIAL MARKER PLACEMENT  03/22/2020   Procedure: FIDUCIAL MARKER PLACEMENT;  Surgeon: Brenna Adine CROME, DO;  Location: [facility];   Service: Pulmonary;;   IR IMAGING GUIDED PORT INSERTION  11/05/2023   IR LUMBAR PUNCTURE  11/05/2023   ROBOTIC ASSITED PARTIAL NEPHRECTOMY Left 2022   VIDEO BRONCHOSCOPY WITH ENDOBRONCHIAL NAVIGATION N/A 03/22/2020   Procedure: VIDEO BRONCHOSCOPY WITH ENDOBRONCHIAL NAVIGATION;  Surgeon: Brenna Adine CROME, DO;  Location: [facility];  Service: Pulmonary;  Laterality: N/A;   VIDEO BRONCHOSCOPY WITH ENDOBRONCHIAL ULTRASOUND N/A 03/22/2020   Procedure: VIDEO BRONCHOSCOPY WITH ENDOBRONCHIAL ULTRASOUND;  Surgeon: Brenna Adine CROME, DO;  Location: [facility];  Service: Pulmonary;  Laterality: N/A;   VIDEO BRONCHOSCOPY WITH ENDOBRONCHIAL ULTRASOUND Bilateral 11/10/2023   Procedure: BRONCHOSCOPY, WITH EBUS;  Surgeon: Shelah Lamar RAMAN, MD;  Location: [facility];  Service: Cardiopulmonary;  Laterality: Bilateral;     FAMILY HISTORY:  Family History  Problem Relation Age of Onset   Sudden death Mother    Rheumatic fever Mother    COPD Brother    Liver disease Neg Hx    Colon cancer Neg Hx    Esophageal cancer Neg Hx      SOCIAL HISTORY:  reports that he quit smoking about 2 years ago. His smoking use included cigarettes. He has a 100 pack-year smoking history. He has never used smokeless tobacco. He reports current alcohol use of about 10.0 standard drinks of alcohol per week. He reports that he does not currently use drugs. The patient is married and lives in [place]. 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    ALLERGIES: Acyclovir and related   MEDICATIONS:  Current Outpatient Medications  Medication Sig Dispense Refill   hydrOXYzine (ATARAX) 10 MG tablet Take 1 tablet (10 mg total) by mouth 3 (three) times daily as needed. 90 tablet 1   alendronate  (FOSAMAX ) 70 MG tablet Take 1 tablet (70 mg total) by mouth every 7 (seven) days. Take with a full glass of water on an empty stomach. (Patient taking differently: Take 70 mg by mouth every Friday. Take with a full glass  of water on an empty stomach.) 12 tablet 4   Calcium Carbonate-Vit D-Min (CALTRATE 600+D PLUS MINERALS) 600-800 MG-UNIT TABS Take 1 tablet by mouth in the morning and at bedtime. (Patient taking differently: Take 1 tablet by mouth daily with breakfast.)     CLARITIN 10 MG tablet Take 10 mg by mouth in the morning.     lidocaine -prilocaine  (EMLA ) cream Apply to affected area once (Patient taking differently: Apply 1 Application topically as needed (for port access).) 30 g 3   LORazepam  (ATIVAN ) 0.5 MG tablet 1 tab po 30 minutes prior to radiation or MRI scans (Patient taking differently: Take 0.5 mg by mouth See admin instructions. Take 0.5 mg by mouth 30 minutes prior to radiation or MRI scans) 10 tablet 0   Multiple Vitamin (MULTIVITAMIN) tablet Take 1 tablet by mouth daily with breakfast.     ondansetron  (ZOFRAN ) 8 MG tablet Take 1 tablet (8 mg total) by mouth every 8 (eight) hours as needed for nausea or vomiting. 30 tablet 1   pantoprazole (PROTONIX) 40 MG tablet Take 1 tablet by mouth twice daily  for 4 weeks then take 1 tablet daily 60 tablet 1   predniSONE (DELTASONE) 10 MG tablet 4 tabs by mouth once daily for 2 days, then 3 tabs daily x 2 days, then 2 tabs daily x 2 days, then 1 tab daily x 2 days 20 tablet 0   prochlorperazine  (COMPAZINE ) 10 MG tablet Take 1 tablet (10 mg total) by mouth every 6 (six) hours as needed for nausea or vomiting. 30 tablet 1   tamsulosin  (FLOMAX ) 0.4 MG CAPS capsule Take 1 capsule (0.4 mg total) by mouth daily. 90 capsule 1   TYLENOL  500 MG tablet Take 500-1,000 mg by mouth every 6 (six) hours as needed (for pain).     Zoster Vaccine Adjuvanted (SHINGRIX ) injection 0.5ml IM now and again in 2-6 months (Patient not taking: Reported on 01/20/2024) 0.5 mL 1   No current facility-administered medications for this encounter.     PHYSICAL EXAM:  Unable to assess due to encounter type.  ECOG = 1  0 - Asymptomatic (Fully active, able to carry on all predisease  activities without restriction)  1 - Symptomatic but completely ambulatory (Restricted in physically strenuous activity but ambulatory and able to carry out work of a light or sedentary nature. For example, light housework, office work)  2 - Symptomatic, <50% in bed during the day (Ambulatory and capable of all self care but unable to carry out any work activities. Up and about more than 50% of waking hours)  3 - Symptomatic, >50% in bed, but not bedbound (Capable of only limited self-care, confined to bed or chair 50% or more of waking hours)  4 - Bedbound (Completely disabled. Cannot carry on any self-care. Totally confined to bed or chair)  5 - Death   Raylene MM, Creech RH, Tormey DC, et al. [phone]). Toxicity and response criteria of the [facility]. Am. DOROTHA Bridges. Oncol. 5 (6): 649-55    LABORATORY DATA:  Lab Results  Component Value Date   WBC 4.2 01/27/2024   HGB 8.5 Repeated and verified X2. (L) 01/27/2024   HCT 24.9 Repeated and verified X2. (L) 01/27/2024   MCV 92.5 01/27/2024   PLT 269.0 01/27/2024   Lab Results  Component Value Date   NA 133 (L) 01/27/2024   K 4.4 01/27/2024   CL 98 01/27/2024   CO2 26 01/27/2024   Lab Results  Component Value Date   ALT 15 01/27/2024   AST 16 01/27/2024   ALKPHOS 73 01/27/2024   BILITOT 0.4 01/27/2024      RADIOGRAPHY: MR Lumbar Spine W Wo Contrast Result Date: 02/11/2024 EXAM: MRI THORACIC AND LUMBAR SPINE WITH AND WITHOUT INTRAVENOUS CONTRAST 02/11/2024 01:52:04 PM TECHNIQUE: Multiplanar multisequence MRI of the thoracic and lumbar spine was performed with and without the administration of intravenous contrast. 5 mL (gadobutrol  (GADAVIST ) 1 MMOL/ML injection 5 mL GADOBUTROL  1 MMOL/ML IV SOLN) was administered. COMPARISON: 10/30/2023 CLINICAL HISTORY: Metastatic disease evaluation. FINDINGS: BONES AND ALIGNMENT: Normal alignment. Normal vertebral body heights. Bone marrow signal is unremarkable. No abnormal  enhancement. There are 6 lumbar vertebral bodies counting down from the craniocervical junction. The lowest disc space is considered L6-S1. This is different from the numbering applied on the prior MRI. Previously described contrast enhancement of the right T5 pedicle is not evident on this study. SPINAL CORD: Normal spinal cord size. Normal spinal cord signal. Unchanged focus of contrast enhancement along the dorsal aspect of the conus medullaris at the L1 level measuring approximately 4 x 2  mm. SOFT TISSUES: Unremarkable. THORACIC DISC LEVELS: No significant disc herniation. No spinal canal stenosis or neural foraminal narrowing. Enhancement at the T7-T8 right sided perineural cyst is unchanged. LUMBAR DISC LEVELS: Previously seen enhancement of the right L5 nerve root is not evident on this study. L1-L2: No significant disc herniation. No spinal canal stenosis or neural foraminal narrowing. L2-L3: Mild disc desiccation. No central spinal canal or neural foraminal stenosis. L3-L4: Left asymmetric disc bulge with mild left foraminal stenosis. No spinal canal stenosis. L4-L5: Small left asymmetric disc bulge. No central spinal canal or neural foraminal stenosis. L5-L6: Small disc bulge with endplate spurring. Mild right foraminal stenosis. L6-S1: Unremarkable. IMPRESSION: 1. Unchanged 4 x 2 mm focus of contrast enhancement along the dorsal conus medullaris at L1. 2. 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 3. 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 Mild left L3L4 foraminal stenosis from asymmetric disc bulge, without spinal canal stenosis. 5. Mild right L5L6 foraminal stenosis from disc bulge with endplate spurring. Electronically signed by: Franky Stanford MD 02/11/2024 04:17 PM EDT RP Workstation: [id number]   MR THORACIC SPINE W WO CONTRAST Result Date: 02/11/2024 EXAM: MRI THORACIC AND LUMBAR SPINE WITH AND WITHOUT INTRAVENOUS CONTRAST 02/11/2024  01:52:04 PM TECHNIQUE: Multiplanar multisequence MRI of the thoracic and lumbar spine was performed with and without the administration of intravenous contrast. 5 mL (gadobutrol  (GADAVIST ) 1 MMOL/ML injection 5 mL GADOBUTROL  1 MMOL/ML IV SOLN) was administered. COMPARISON: 10/30/2023 CLINICAL HISTORY: Metastatic disease evaluation. FINDINGS: BONES AND ALIGNMENT: Normal alignment. Normal vertebral body heights. Bone marrow signal is unremarkable. No abnormal enhancement. There are 6 lumbar vertebral bodies counting down from the craniocervical junction. The lowest disc space is considered L6-S1. This is different from the numbering applied on the prior MRI. Previously described contrast enhancement of the right T5 pedicle is not evident on this study. SPINAL CORD: Normal spinal cord size. Normal spinal cord signal. Unchanged focus of contrast enhancement along the dorsal aspect of the conus medullaris at the L1 level measuring approximately 4 x 2 mm. SOFT TISSUES: Unremarkable. THORACIC DISC LEVELS: No significant disc herniation. No spinal canal stenosis or neural foraminal narrowing. Enhancement at the T7-T8 right sided perineural cyst is unchanged. LUMBAR DISC LEVELS: Previously seen enhancement of the right L5 nerve root is not evident on this study. L1-L2: No significant disc herniation. No spinal canal stenosis or neural foraminal narrowing. L2-L3: Mild disc desiccation. No central spinal canal or neural foraminal stenosis. L3-L4: Left asymmetric disc bulge with mild left foraminal stenosis. No spinal canal stenosis. L4-L5: Small left asymmetric disc bulge. No central spinal canal or neural foraminal stenosis. L5-L6: Small disc bulge with endplate spurring. Mild right foraminal stenosis. L6-S1: Unremarkable. IMPRESSION: 1. Unchanged 4 x 2 mm focus of contrast enhancement along the dorsal conus medullaris at L1. 2. 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 3. 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 Mild left L3L4 foraminal stenosis from asymmetric disc bulge, without spinal canal stenosis. 5. Mild right L5L6 foraminal stenosis from disc bulge with endplate spurring. Electronically signed by: Franky Stanford MD 02/11/2024 04:17 PM EDT RP Workstation: [id number]   MR CERVICAL SPINE W WO CONTRAST Result Date: 02/11/2024 EXAM: MRI CERVICAL SPINE WITH AND WITHOUT CONTRAST 02/09/2024 02:52:41 PM TECHNIQUE: Multiplanar multisequence MRI of the cervical spine was performed with and without intravenous contrast. 5 mL of gadobutrol  (GADAVIST ) 1 MMOL/ML injection was administered. COMPARISON: MRI of the cervical spine dated 11/03/2023. CLINICAL HISTORY:  FINDINGS: BONES AND ALIGNMENT: Normal alignment. Normal vertebral body heights. Bone marrow signal is unremarkable. There is no evidence of metastatic disease to the cervical spine. There is an enhancing lesion again seen posteriorly within the T2 vertebral body which remains concerning for possible metastasis. SPINAL CORD: The spinal cord is normal in morphology and signal intensity. SOFT TISSUES: No paraspinal mass. C2-C3: No significant disc herniation. No spinal canal stenosis or neural foraminal narrowing. C3-C4: No significant disc herniation. No spinal canal stenosis or neural foraminal narrowing. C4-C5: There is mild disc space narrowing and uncovertebral joint hypertrophy at C4-C5 with mild central spinal canal stenosis and bilateral neural foraminal stenosis. C5-C6: There is mild disc space narrowing and uncovertebral joint hypertrophy at C5-C6 with mild central spinal canal stenosis and bilateral neural foraminal stenosis. C6-C7: No significant disc herniation. No spinal canal stenosis or neural foraminal narrowing. C7-T1: No significant disc herniation. No spinal canal stenosis or neural foraminal narrowing. IMPRESSION: 1. No evidence of metastatic disease to the cervical spine. 2. Enhancing lesion again  seen posteriorly within the T2 vertebral body, concerning for possible metastasis; correlation with clinical history and consideration of targeted evaluation recommended. 3. Mild disc space narrowing and uncovertebral joint hypertrophy at C4-5 and C5-6 with mild central spinal canal stenosis and bilateral neural foraminal stenosis at each level. Electronically signed by: Evalene Coho MD 02/11/2024 05:29 AM EDT RP Workstation: [id number]   MR Brain W Wo Contrast Result Date: 02/11/2024 EXAM: MRI BRAIN WITH AND WITHOUT CONTRAST 02/09/2024 02:50:50 PM TECHNIQUE: Multiplanar multisequence MRI of the head/brain was performed with and without the administration of intravenous contrast. 5 mL of gadobutrol  (GADAVIST ) 1 MMOL/ML injection was administered. COMPARISON: MRI of the head dated 11/03/2023. CLINICAL HISTORY: FINDINGS: BRAIN AND VENTRICLES: There has been a mixed response to interval therapy with overall improvement of metastatic disease to the cerebellar metastases and marginal worsening of metastatic disease to the cerebral hemispheres. Regarding the cerebellar metastases, residual lesions are seen on image 121 of series 1800 within the right cerebellar hemisphere and image 140 within the left cerebellar hemisphere. The other previously described cerebellar lesions are no longer apparent. Regarding the cerebral hemispheres, there appears to be a new metastatic lesion within the right occipital lobe on image 171. There is also a lesion within the right temporal lobe on image 157 which in retrospect was present previously, but is now more conspicuous. A ring-enhancing lesion previously noted within the right temporal lobe on image 171 has increased in diameter from 4 mm to 6 mm. There is a lesion within the left frontal lobe on image 201 which is now well visualized but was also faintly evident on the previous study. Similarly, there is a lesion within the right frontal lobe on image 206, which was faintly  visible previously and is now more conspicuous. A re-enhancing lesion previously noted within the left occipital lobe on image 162 has decreased in size and is no longer ring-enhancing. A lesion present medially within the left occipital lobe on image 175 is also less conspicuous than on the previous study. There is a residual lesion within the right parietal lobe on image 215 and within the left frontal lobe on the same image. There is also a lesion again demonstrated within the right frontal lobe on image 228. No acute infarct. No acute intracranial hemorrhage. No mass effect or midline shift. No hydrocephalus. The sella is unremarkable. Normal flow voids. ORBITS: No acute abnormality. SINUSES: No acute abnormality. BONES AND SOFT TISSUES: Normal bone marrow  signal and enhancement. No acute soft tissue abnormality. IMPRESSION: 1. Mixed response to interval therapy with overall improvement of metastatic disease in the cerebellar hemispheres and marginal worsening in the cerebral hemispheres. 2. New metastatic lesion within the right occipital lobe. 3. Increased size of the ring-enhancing lesion within the right temporal lobe from 4 mm to 6 mm. 4. Decreased size of the lesion within the left occipital lobe, which is no longer ring-enhancing. Electronically signed by: Evalene Coho MD 02/11/2024 05:20 AM EDT RP Workstation: [id number]        IMPRESSION/PLAN: 1. 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 The patient's disease burden is felt to be most consistent with his known recurrent metastatic renal cell carcinoma. I shared with him the recommendations to proceed with stereotactic radiosurgery [facility]) to the new lesions confirmed with MRI fusion of his prior therapy which includes 6 targets for therapy. He is happy  to hear that his MRI scans of the spine did not show concerns for progressive findings of leptomeningeal disease. Dr. Dewey recommends proceeding with single fraction SRS to the 6 targets. We discussed the risks, benefits, short, and long term effects of radiotherapy, as well as the palliative intent, and the patient is interested in proceeding. The patient will continue with plans of following up next week with Dr. Sherrod to discuss his plans for systemic therapy.  2. Pruritic rash likely secondary to immunotherapy. I offered Atarax to the patient to see if this would help his symptoms since he's not had success with steroids or several antihistamines to date. We reviewed the side effect profile and he is in agreement to let us  know how he finds this intervention to be.  3. Bilateral mastoid effusions with hearing loss.  We follow his symptoms to determine when he needs steroid Dosepaks but we will continue to follow these findings at subsequent visits. 4. Claustrophobia.  The patient is comfortable with continuing to use Ativan  as needed prior to MRIs and/or treatment.    This encounter was conducted via telephone.  The patient has provided two factor identification and has given verbal consent for this type of encounter and has been advised to only accept a meeting of this type in a secure network environment. The time spent during this encounter was 35 minutes including preparation, discussion, and coordination of the patient's care. The attendants for this meeting include Ian Hart  and Maude JINNY Marcus. During the encounter, Ian Hart was located at [facility] in Radiation Oncology. Ian Hart was located at home.    Ian KYM Hart, PAC

## 2024-02-16 NOTE — Addendum Note (Signed)
 Encounter addended by: Lanell Donald Stagger, PA-C on: 02/16/2024 12:07 PM  Actions taken: Clinical Note Signed, Level of Service modified

## 2024-02-16 NOTE — Addendum Note (Signed)
 Encounter addended by: Albino Dyke FALCON, LPN on: 88/08/7972 9:19 AM  Actions taken: Flowsheet accepted

## 2024-02-16 NOTE — Addendum Note (Signed)
 Encounter addended by: Lanell Donald Stagger, PA-C on: 02/16/2024 9:56 AM  Actions taken: Pend clinical note

## 2024-02-16 NOTE — Progress Notes (Signed)
 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 Patient is followed by Dr. Sherrod. He has been receiving Yervoy  and nivolumab . Planning to start SRS for brain mets under the care of Dr. Dewey.   10/3-10/7 admission with CAP  Met with patient in office following CT SIM. Patient reports feeling significantly better, noting more energy. Diarrhea resolved 2 weeks ago. Having a couple soft BM/day. Reports taking prednisone for immunotherapy induced rash. This did not help with rash, however feels this was beneficial with boosting appetite. Reports PA Ozella Husband) has prescribed a different medication for rash that he will pick up today.   Patient says he is not able to eat a large portion, but snacks all day. Recalls CIB smoothie (2% milk, frozen berries, strawberry powder) for breakfast followed by small portions every 1-2 hours including (fruit cup, muffin with lots of butter, PB crackers, bowl of cereal with 2% milk, ham/cheese sandwich, cheese crackers, cheese dip with tostitos). He is able to eat dinner meal. Had leftover steak, red potatoes, and a salad last night. Patient ate all of this. He is drinking one Ensure Plus as well as liquid IV.    Medications: hydroxyzine (11/3), prednisone (10/27)  Labs: 10/15 - reviewed   Anthropometrics: Wt 128 lb 4 oz today - increased   10/15 - 116 lb  9/25 - 108 lb 14.4 oz    NUTRITION DIAGNOSIS: Unintended wt loss - improving   MALNUTRITION DIAGNOSIS: Severe malnutrition continues, however improving    INTERVENTION:  Continue strategies for increasing calories and protein with small frequent meals/snacks Encourage high calorie high protein foods for wt gain Continue daily Ensure Plus/equivalent - coupons provided  Contact information    MONITORING, EVALUATION, GOAL: wt trends, intake   NEXT VISIT: To be scheduled as needed

## 2024-02-18 ENCOUNTER — Ambulatory Visit

## 2024-02-18 ENCOUNTER — Inpatient Hospital Stay: Admitting: Nutrition

## 2024-02-18 ENCOUNTER — Ambulatory Visit: Admitting: Internal Medicine

## 2024-02-18 ENCOUNTER — Other Ambulatory Visit

## 2024-02-19 DIAGNOSIS — Z5112 Encounter for antineoplastic immunotherapy: Secondary | ICD-10-CM | POA: Diagnosis not present

## 2024-02-23 ENCOUNTER — Inpatient Hospital Stay: Admitting: Internal Medicine

## 2024-02-23 VITALS — BP 109/59 | HR 98 | Temp 97.6°F | Resp 17 | Ht 73.0 in | Wt 122.0 lb

## 2024-02-23 DIAGNOSIS — C3491 Malignant neoplasm of unspecified part of right bronchus or lung: Secondary | ICD-10-CM | POA: Diagnosis not present

## 2024-02-23 DIAGNOSIS — Z5112 Encounter for antineoplastic immunotherapy: Secondary | ICD-10-CM | POA: Diagnosis not present

## 2024-02-23 DIAGNOSIS — C642 Malignant neoplasm of left kidney, except renal pelvis: Secondary | ICD-10-CM | POA: Diagnosis not present

## 2024-02-23 NOTE — Progress Notes (Signed)
 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    PRIOR THERAPY:  1) Systemic chemotherapy/radiation with cisplatin  80 mg per metered squared on day 1 and etoposide  100 mg per metered squared on days 1, 2, and 3 IV every 3 weeks.  Status post 4 cycles.  Starting from cycle #3, cisplatin  was changed to carboplatin  for an AUC of 5 due to renal insufficiency. First dose on 04/20/20. Onpro was added to the treatment plan starting from cycle #3 due to pancytopenia.  2) First-line treatment with immunotherapy with ipilimumab  1 Mg/KG in addition to nivolumab  360 Mg IV every 3 weeks for 3 cycles.  Cycle #4 was discontinued secondary to intolerance with significant diarrhea and rash. First dose of treatment November 25, 2023.    CURRENT THERAPY: Maintenance treatment with nivolumab  480 Mg IV every 4 weeks.  First dose March 01, 2024.  INTERVAL HISTORY: Ian Hart 73 y.o. male returns to the clinic today for follow-up visit accompanied by his wife.Discussed the use of AI scribe software for clinical note transcription with the patient, who gave verbal consent to proceed.  History of Present Illness Ian Hart is a 73 year old male with metastatic clear cell carcinoma of the left kidney who presents for reevaluation before resuming treatment. He is accompanied by his wife.  He has a history of metastatic clear cell carcinoma of the left kidney diagnosed in July 2025 and poorly differentiated carcinoma consistent with adenocarcinoma involving the right upper lobe lung nodule, treated with  Stereotactic Body Radiation Therapy (SBRT).  Recently, he experienced a significant adverse reaction following his last treatment cycle, which included a persistent rash and diarrhea lasting six weeks. The diarrhea led to substantial weight loss, decreased appetite, and fatigue, resulting in a hospitalization in early October. During his five-day hospital stay, he received fluids and antibiotics, which improved his condition. Since discharge, his appetite has returned, and he has gained 15 pounds, now weighing 122 pounds. His diarrhea has resolved, and his bowel movements have normalized.  He continues to experience an 'incredible rash' that is itchy and covers his back and legs. He has been taking Atarax 10 mg three times a day for the itching, but it is not effective. He denied any insect bites as a cause for the rash.  His energy levels have improved since his hospitalization, and he feels better overall.   MEDICAL HISTORY: Past Medical History:  Diagnosis Date   History of chicken pox    History of shingles 04/2010   Lung cancer (HCC) 02/2020   Routine general medical examination at a health care facility 11/10/2011    ALLERGIES:  is allergic to acyclovir and related.  MEDICATIONS:  Current Outpatient Medications  Medication Sig Dispense Refill   alendronate  (FOSAMAX ) 70 MG tablet Take 1 tablet (70 mg total) by mouth every 7 (seven) days. Take with a full glass of water on an empty stomach. (Patient taking differently: Take 70 mg by mouth every Friday. Take with a full glass of water on an empty stomach.) 12 tablet 4   Calcium Carbonate-Vit D-Min (CALTRATE 600+D  PLUS MINERALS) 600-800 MG-UNIT TABS Take 1 tablet by mouth in the morning and at bedtime. (Patient taking differently: Take 1 tablet by mouth daily with breakfast.)     CLARITIN 10 MG tablet Take 10 mg by mouth in the morning.     hydrOXYzine (ATARAX) 10 MG tablet Take 1 tablet (10 mg total) by mouth 3 (three) times daily as  needed. 90 tablet 1   lidocaine -prilocaine  (EMLA ) cream Apply to affected area once (Patient taking differently: Apply 1 Application topically as needed (for port access).) 30 g 3   LORazepam  (ATIVAN ) 0.5 MG tablet 1 tab po 30 minutes prior to radiation or MRI scans (Patient taking differently: Take 0.5 mg by mouth See admin instructions. Take 0.5 mg by mouth 30 minutes prior to radiation or MRI scans) 10 tablet 0   Multiple Vitamin (MULTIVITAMIN) tablet Take 1 tablet by mouth daily with breakfast.     ondansetron  (ZOFRAN ) 8 MG tablet Take 1 tablet (8 mg total) by mouth every 8 (eight) hours as needed for nausea or vomiting. 30 tablet 1   pantoprazole (PROTONIX) 40 MG tablet Take 1 tablet by mouth twice daily for 4 weeks then take 1 tablet daily 60 tablet 1   predniSONE (DELTASONE) 10 MG tablet 4 tabs by mouth once daily for 2 days, then 3 tabs daily x 2 days, then 2 tabs daily x 2 days, then 1 tab daily x 2 days 20 tablet 0   prochlorperazine  (COMPAZINE ) 10 MG tablet Take 1 tablet (10 mg total) by mouth every 6 (six) hours as needed for nausea or vomiting. 30 tablet 1   tamsulosin  (FLOMAX ) 0.4 MG CAPS capsule Take 1 capsule (0.4 mg total) by mouth daily. 90 capsule 1   TYLENOL  500 MG tablet Take 500-1,000 mg by mouth every 6 (six) hours as needed (for pain).     Zoster Vaccine Adjuvanted (SHINGRIX ) injection 0.5ml IM now and again in 2-6 months (Patient not taking: Reported on 01/20/2024) 0.5 mL 1   No current facility-administered medications for this visit.    SURGICAL HISTORY:  Past Surgical History:  Procedure Laterality Date   BRONCHIAL BIOPSY  03/22/2020   Procedure: BRONCHIAL BIOPSIES;  Surgeon: Brenna Adine CROME, DO;  Location: [facility];  Service: Pulmonary;;   BRONCHIAL BRUSHINGS  03/22/2020   Procedure: BRONCHIAL BRUSHINGS;  Surgeon: Brenna Adine CROME, DO;  Location: [facility];  Service: Pulmonary;;   BRONCHIAL NEEDLE ASPIRATION BIOPSY  03/22/2020   Procedure: BRONCHIAL NEEDLE  ASPIRATION BIOPSIES;  Surgeon: Brenna Adine CROME, DO;  Location: [facility];  Service: Pulmonary;;   BRONCHIAL WASHINGS  03/22/2020   Procedure: BRONCHIAL WASHINGS;  Surgeon: Brenna Adine CROME, DO;  Location: [facility];  Service: Pulmonary;;   ESOPHAGOGASTRODUODENOSCOPY N/A 01/19/2024   Procedure: EGD (ESOPHAGOGASTRODUODENOSCOPY);  Surgeon: Abran [name], MD;  Location: [facility];  Service: Gastroenterology;  Laterality: N/A;   FIDUCIAL MARKER PLACEMENT  03/22/2020   Procedure: FIDUCIAL MARKER PLACEMENT;  Surgeon: Brenna Adine CROME, DO;  Location: [facility];  Service: Pulmonary;;   IR IMAGING GUIDED PORT INSERTION  11/05/2023   IR LUMBAR PUNCTURE  11/05/2023   ROBOTIC ASSITED PARTIAL NEPHRECTOMY Left 2022   VIDEO BRONCHOSCOPY WITH ENDOBRONCHIAL NAVIGATION N/A 03/22/2020   Procedure: VIDEO BRONCHOSCOPY WITH ENDOBRONCHIAL NAVIGATION;  Surgeon: Brenna Adine CROME, DO;  Location: [facility];  Service: Pulmonary;  Laterality: N/A;   VIDEO BRONCHOSCOPY WITH ENDOBRONCHIAL ULTRASOUND N/A 03/22/2020   Procedure: VIDEO BRONCHOSCOPY WITH ENDOBRONCHIAL ULTRASOUND;  Surgeon: Brenna Adine CROME, DO;  Location: [facility];  Service: Pulmonary;  Laterality: N/A;   VIDEO BRONCHOSCOPY WITH ENDOBRONCHIAL ULTRASOUND Bilateral 11/10/2023   Procedure: BRONCHOSCOPY, WITH EBUS;  Surgeon: Shelah Lamar RAMAN, MD;  Location: [facility];  Service: Cardiopulmonary;  Laterality: Bilateral;    REVIEW OF SYSTEMS:  Constitutional: positive for fatigue Eyes: negative Ears, nose, mouth, throat, and face: negative Respiratory: negative Cardiovascular: negative Gastrointestinal: negative Genitourinary:negative Integument/breast: positive for pruritus and rash Hematologic/lymphatic: negative Musculoskeletal:positive for arthralgias Neurological: negative Behavioral/Psych: negative Endocrine: negative Allergic/Immunologic: negative   PHYSICAL EXAMINATION: General appearance: alert, cooperative, fatigued, and no  distress Head: Normocephalic, without obvious abnormality, atraumatic Neck: no adenopathy, no JVD, supple, symmetrical, trachea midline, and thyroid  not enlarged, symmetric, no tenderness/mass/nodules Lymph nodes: Cervical, supraclavicular, and axillary nodes normal. Resp: clear to auscultation bilaterally Back: symmetric, no curvature. ROM normal. No CVA tenderness. Cardio: regular rate and rhythm, S1, S2 normal, no murmur, click, rub or gallop GI: soft, non-tender; bowel sounds normal; no masses,  no organomegaly Extremities: extremities normal, atraumatic, no cyanosis or edema Neurologic: Alert and oriented X 3, normal strength and tone. Normal symmetric reflexes. Normal coordination and gait  ECOG PERFORMANCE STATUS: 1 - Symptomatic but completely ambulatory  Blood pressure (!) 109/59, pulse 98, temperature 97.6 F (36.4 C), temperature source Temporal, resp. rate 17, height 6' 1 (1.854 m), weight 122 lb (55.3 kg), SpO2 99%.  LABORATORY DATA: Lab Results  Component Value Date   WBC 4.2 01/27/2024   HGB 8.5 Repeated and verified X2. (L) 01/27/2024   HCT 24.9 Repeated and verified X2. (L) 01/27/2024   MCV 92.5 01/27/2024   PLT 269.0 01/27/2024      Chemistry      Component Value Date/Time   NA 133 (L) 01/27/2024 1635   K 4.4 01/27/2024 1635   CL 98 01/27/2024 1635   CO2 26 01/27/2024 1635   BUN 16 01/27/2024 1635   CREATININE 1.02 01/27/2024 1635   CREATININE 1.42 (H) 01/15/2024 1140   CREATININE 0.86 11/12/2011 0917      Component Value Date/Time   CALCIUM 8.6 01/27/2024 1635   ALKPHOS 73 01/27/2024 1635   AST 16 01/27/2024 1635   AST 12 (L) 01/15/2024 1140   ALT 15 01/27/2024 1635   ALT 21 01/15/2024 1140   BILITOT 0.4 01/27/2024 1635   BILITOT 0.4 01/15/2024 1140       RADIOGRAPHIC STUDIES: MR Lumbar Spine W Wo Contrast Result Date: 02/11/2024 EXAM: MRI THORACIC AND LUMBAR SPINE WITH AND WITHOUT INTRAVENOUS CONTRAST 02/11/2024 01:52:04 PM TECHNIQUE:  Multiplanar multisequence MRI of the thoracic and lumbar spine was performed with and without the administration of intravenous contrast. 5 mL (gadobutrol  (GADAVIST ) 1 MMOL/ML injection 5 mL GADOBUTROL  1 MMOL/ML IV SOLN) was administered. COMPARISON: 10/30/2023 CLINICAL HISTORY: Metastatic disease evaluation. FINDINGS: BONES AND ALIGNMENT: Normal alignment. Normal vertebral body heights. Bone marrow signal is unremarkable. No abnormal enhancement. There are 6 lumbar vertebral bodies counting down from the craniocervical junction. The lowest disc space is considered L6-S1. This is different from the numbering applied on the prior MRI. Previously described contrast enhancement of the right T5 pedicle is not evident on this study. SPINAL CORD: Normal spinal cord size. Normal spinal cord signal. Unchanged focus of contrast enhancement along the dorsal aspect of the conus medullaris at the L1 level measuring approximately 4 x 2 mm. SOFT TISSUES: Unremarkable. THORACIC DISC LEVELS: No significant disc herniation. No spinal canal stenosis or neural foraminal narrowing. Enhancement at the T7-T8 right sided perineural cyst is unchanged. LUMBAR DISC LEVELS: Previously seen  enhancement of the right L5 nerve root is not evident on this study. L1-L2: No significant disc herniation. No spinal canal stenosis or neural foraminal narrowing. L2-L3: Mild disc desiccation. No central spinal canal or neural foraminal stenosis. L3-L4: Left asymmetric disc bulge with mild left foraminal stenosis. No spinal canal stenosis. L4-L5: Small left asymmetric disc bulge. No central spinal canal or neural foraminal stenosis. L5-L6: Small disc bulge with endplate spurring. Mild right foraminal stenosis. L6-S1: Unremarkable. IMPRESSION: 1. Unchanged 4 x 2 mm focus of contrast enhancement along the dorsal conus medullaris at L1. 2. 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 3. 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 Mild left L3L4 foraminal stenosis from asymmetric disc bulge, without spinal canal stenosis. 5. Mild right L5L6 foraminal stenosis from disc bulge with endplate spurring. Electronically signed by: Franky Stanford MD 02/11/2024 04:17 PM EDT RP Workstation: [id number]   MR THORACIC SPINE W WO CONTRAST Result Date: 02/11/2024 EXAM: MRI THORACIC AND LUMBAR SPINE WITH AND WITHOUT INTRAVENOUS CONTRAST 02/11/2024 01:52:04 PM TECHNIQUE: Multiplanar multisequence MRI of the thoracic and lumbar spine was performed with and without the administration of intravenous contrast. 5 mL (gadobutrol  (GADAVIST ) 1 MMOL/ML injection 5 mL GADOBUTROL  1 MMOL/ML IV SOLN) was administered. COMPARISON: 10/30/2023 CLINICAL HISTORY: Metastatic disease evaluation. FINDINGS: BONES AND ALIGNMENT: Normal alignment. Normal vertebral body heights. Bone marrow signal is unremarkable. No abnormal enhancement. There are 6 lumbar vertebral bodies counting down from the craniocervical junction. The lowest disc space is considered L6-S1. This is different from the numbering applied on the prior MRI. Previously described contrast enhancement of the right T5 pedicle is not evident on this study. SPINAL CORD: Normal spinal cord size. Normal spinal cord signal. Unchanged focus of contrast enhancement along the dorsal aspect of the conus medullaris at the L1 level measuring approximately 4 x 2 mm. SOFT TISSUES: Unremarkable. THORACIC DISC LEVELS: No significant disc herniation. No spinal canal stenosis or neural foraminal narrowing. Enhancement at the T7-T8 right sided perineural cyst is unchanged. LUMBAR DISC LEVELS: Previously seen enhancement of the right L5 nerve root is not evident on this study. L1-L2: No significant disc herniation. No spinal canal stenosis or neural foraminal narrowing. L2-L3: Mild disc desiccation. No central spinal canal or neural foraminal stenosis. L3-L4: Left asymmetric disc bulge with mild left  foraminal stenosis. No spinal canal stenosis. L4-L5: Small left asymmetric disc bulge. No central spinal canal or neural foraminal stenosis. L5-L6: Small disc bulge with endplate spurring. Mild right foraminal stenosis. L6-S1: Unremarkable. IMPRESSION: 1. Unchanged 4 x 2 mm focus of contrast enhancement along the dorsal conus medullaris at L1. 2. 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 3. 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 Mild left L3L4 foraminal stenosis from asymmetric disc bulge, without spinal canal stenosis. 5. Mild right L5L6 foraminal stenosis from disc bulge with endplate spurring. Electronically signed by: Franky Stanford MD 02/11/2024 04:17 PM EDT RP Workstation: [id number]   MR CERVICAL SPINE W WO CONTRAST Result Date: 02/11/2024 EXAM: MRI CERVICAL SPINE WITH AND WITHOUT CONTRAST 02/09/2024 02:52:41 PM TECHNIQUE: Multiplanar multisequence MRI of the cervical spine was performed with and without intravenous contrast. 5 mL of gadobutrol  (GADAVIST ) 1 MMOL/ML injection was administered. COMPARISON: MRI of the cervical spine dated 11/03/2023. CLINICAL HISTORY: FINDINGS: BONES AND ALIGNMENT: Normal alignment. Normal vertebral body heights. Bone marrow signal is unremarkable. There is no evidence of metastatic disease to the cervical spine. There is an enhancing lesion again seen posteriorly  within the T2 vertebral body which remains concerning for possible metastasis. SPINAL CORD: The spinal cord is normal in morphology and signal intensity. SOFT TISSUES: No paraspinal mass. C2-C3: No significant disc herniation. No spinal canal stenosis or neural foraminal narrowing. C3-C4: No significant disc herniation. No spinal canal stenosis or neural foraminal narrowing. C4-C5: There is mild disc space narrowing and uncovertebral joint hypertrophy at C4-C5 with mild central spinal canal stenosis and bilateral neural foraminal stenosis. C5-C6: There  is mild disc space narrowing and uncovertebral joint hypertrophy at C5-C6 with mild central spinal canal stenosis and bilateral neural foraminal stenosis. C6-C7: No significant disc herniation. No spinal canal stenosis or neural foraminal narrowing. C7-T1: No significant disc herniation. No spinal canal stenosis or neural foraminal narrowing. IMPRESSION: 1. No evidence of metastatic disease to the cervical spine. 2. Enhancing lesion again seen posteriorly within the T2 vertebral body, concerning for possible metastasis; correlation with clinical history and consideration of targeted evaluation recommended. 3. Mild disc space narrowing and uncovertebral joint hypertrophy at C4-5 and C5-6 with mild central spinal canal stenosis and bilateral neural foraminal stenosis at each level. Electronically signed by: Evalene Coho MD 02/11/2024 05:29 AM EDT RP Workstation: [id number]   MR Brain W Wo Contrast Result Date: 02/11/2024 EXAM: MRI BRAIN WITH AND WITHOUT CONTRAST 02/09/2024 02:50:50 PM TECHNIQUE: Multiplanar multisequence MRI of the head/brain was performed with and without the administration of intravenous contrast. 5 mL of gadobutrol  (GADAVIST ) 1 MMOL/ML injection was administered. COMPARISON: MRI of the head dated 11/03/2023. CLINICAL HISTORY: FINDINGS: BRAIN AND VENTRICLES: There has been a mixed response to interval therapy with overall improvement of metastatic disease to the cerebellar metastases and marginal worsening of metastatic disease to the cerebral hemispheres. Regarding the cerebellar metastases, residual lesions are seen on image 121 of series 1800 within the right cerebellar hemisphere and image 140 within the left cerebellar hemisphere. The other previously described cerebellar lesions are no longer apparent. Regarding the cerebral hemispheres, there appears to be a new metastatic lesion within the right occipital lobe on image 171. There is also a lesion within the right temporal lobe on  image 157 which in retrospect was present previously, but is now more conspicuous. A ring-enhancing lesion previously noted within the right temporal lobe on image 171 has increased in diameter from 4 mm to 6 mm. There is a lesion within the left frontal lobe on image 201 which is now well visualized but was also faintly evident on the previous study. Similarly, there is a lesion within the right frontal lobe on image 206, which was faintly visible previously and is now more conspicuous. A re-enhancing lesion previously noted within the left occipital lobe on image 162 has decreased in size and is no longer ring-enhancing. A lesion present medially within the left occipital lobe on image 175 is also less conspicuous than on the previous study. There is a residual lesion within the right parietal lobe on image 215 and within the left frontal lobe on the same image. There is also a lesion again demonstrated within the right frontal lobe on image 228. No acute infarct. No acute intracranial hemorrhage. No mass effect or midline shift. No hydrocephalus. The sella is unremarkable. Normal flow voids. ORBITS: No acute abnormality. SINUSES: No acute abnormality. BONES AND SOFT TISSUES: Normal bone marrow signal and enhancement. No acute soft tissue abnormality. IMPRESSION: 1. Mixed response to interval therapy with overall improvement of metastatic disease in the cerebellar hemispheres and marginal worsening in the cerebral hemispheres. 2. New  metastatic lesion within the right occipital lobe. 3. Increased size of the ring-enhancing lesion within the right temporal lobe from 4 mm to 6 mm. 4. Decreased size of the lesion within the left occipital lobe, which is no longer ring-enhancing. Electronically signed by: Evalene Coho MD 02/11/2024 05:20 AM EDT RP Workstation: [id number]     ASSESSMENT AND PLAN: This is a very pleasant 73 years old white male diagnosed with poorly differentiated carcinoma consistent with  adenocarcinoma in the right upper lobe.  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  The patient also has a suspicious left renal lesion concerning for renal cell carcinoma. He is status post a course of systemic chemotherapy initially with cisplatin  and etoposide  but the cisplatin  was discontinued secondary to renal insufficiency and the patient continued 3 more cycles of his systemic chemotherapy with carboplatin  and etoposide  concurrent with radiation.   He had evidence for disease recurrence with metastatic brain lesions in addition to right hilar lymphadenopathy treated with SRS as well as palliative radiotherapy to the right hilar area. He had a PET scan performed recently that showed evidence for disease progression in the mediastinal lymph nodes as well as right adrenal metastasis. He had bronchoscopy done recently by Dr. Shelah and the final pathology from the 2R and 4L lymph nodes showed malignant cells consistent with renal cell carcinoma. He underwent treatment with immunotherapy with ipilimumab  1 mg/KG in addition to nivolumab  360 mg IV every 3 weeks.  Status post 3 cycle. This treatment was discontinued secondary to immunotherapy mediated diarrhea and skin rash. He will start the first cycle of maintenance treatment with single agent nivolumab  480 mg IV every 4 weeks on March 01, 2024. Assessment and Plan Assessment & Plan Metastatic clear cell renal cell carcinoma of the left kidney Diagnosed in July 2025. Recent hospitalization due to severe side effects from combined immunotherapy (nivolumab  and ipilimumab ), including rash and diarrhea. Decision made to discontinue ipilimumab  due to its association with increased side effects. Plan to continue treatment with nivolumab  alone to minimize adverse effects while maintaining therapeutic efficacy. - Discontinued ipilimumab  due to side effects. - Will resume treatment with nivolumab  alone every  four weeks. - Scheduled SRS to the brain for tomorrow, one session. - Monitor for side effects and adjust treatment as necessary.  History of poorly differentiated adenocarcinoma of the right upper lobe lung, status post SBRT Poorly differentiated adenocarcinoma of the right upper lobe lung, treated with SBRT.  Drug-induced rash Persistent rash likely secondary to immunotherapy, presenting as itching and resembling insect bites on the back and legs. Current treatment with Atarax (hydroxyzine) is insufficient.  Diarrhea, resolved Diarrhea resolved following hospitalization and treatment with fluids and antibiotics. No current issues with bowel movements.  Weight loss, resolved Weight loss secondary to diarrhea and decreased oral intake, resolved with improved appetite and weight gain of 14 pounds since hospitalization. The patient was advised to call immediately if he has any concerning symptoms in the interval The patient voices understanding of current disease status and treatment options and is in agreement with the current care plan.  All questions were answered. The patient knows to call the clinic with any problems, questions or concerns. We can certainly see the patient much sooner if necessary. The total time spent in the appointment was 30 minutes including review of chart and various tests results, discussions about plan of care and coordination of care plan .   Disclaimer: This note was  dictated with voice recognition software. Similar sounding words can inadvertently be transcribed and may not be corrected upon review.

## 2024-02-24 ENCOUNTER — Other Ambulatory Visit: Payer: Self-pay

## 2024-02-24 ENCOUNTER — Ambulatory Visit
Admission: RE | Admit: 2024-02-24 | Discharge: 2024-02-24 | Disposition: A | Discharge status: Home / Self Care | Source: Ambulatory Visit | Attending: Radiation Oncology | Admitting: Radiation Oncology

## 2024-02-24 DIAGNOSIS — Z5112 Encounter for antineoplastic immunotherapy: Secondary | ICD-10-CM | POA: Diagnosis not present

## 2024-02-24 LAB — RAD ONC ARIA SESSION SUMMARY
Course Elapsed Days: 0
Plan Fractions Treated to Date: 1
Plan Prescribed Dose Per Fraction: 20 Gy
Plan Total Fractions Prescribed: 1
Plan Total Prescribed Dose: 20 Gy
Reference Point Dosage Given to Date: 20 Gy
Reference Point Session Dosage Given: 20 Gy
Session Number: 1

## 2024-02-24 NOTE — Progress Notes (Signed)
 Mr. Ian Hart rested with us  for 30 minutes following his SRS treatment.  Patient denies headache, dizziness, nausea, diplopia or ringing in the ears. Denies fatigue. Patient without complaints. Understands to avoid strenuous activity for the next 24 hours and call [phone] with needs.   BP 122/69 (BP Location: Left Arm)   Pulse 84   Temp (!) 96.9 F (36.1 C) (Temporal)   Resp 18   SpO2 100%

## 2024-02-25 ENCOUNTER — Telehealth: Payer: Self-pay | Admitting: Internal Medicine

## 2024-02-25 NOTE — Telephone Encounter (Signed)
 Scheduled patient for next appointment. Called and spoke with the patient, he is aware.

## 2024-02-26 NOTE — Radiation Completion Notes (Addendum)
  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   Intent: Palliative     ==========DELIVERED PLANS==========  First Treatment Date: 2024-02-24 Last Treatment Date: 2024-02-24   Plan Name: Brain_SRS Site: Brain PTV_29_Rt_Temporal_27mm PTV_30_Rt_Parietal_23mm PTV_31_Rt_Frontal_20mm Technique: SBRT/SRT-IMRT Mode: Photon Dose Per Fraction: 20 Gy Prescribed Dose (Delivered / Prescribed): 20 Gy / 20 Gy Prescribed Fxs (Delivered / Prescribed): 1 / 1     ==========ON TREATMENT VISIT DATES========== 2024-02-24    See weekly On Treatment Notes in Epic for details in the Media tab (listed as Progress notes on the On Treatment Visit Dates listed above).The patient tolerated radiation.    The patient will receive a call in about one month from the radiation oncology department. He will continue follow up with Dr. Sherrod as well as with repeat MRI in 3 months for surveillance of the brain.      Donald KYM Husband, PAC

## 2024-03-01 ENCOUNTER — Inpatient Hospital Stay

## 2024-03-01 VITALS — BP 95/48 | HR 89 | Temp 98.2°F | Resp 16 | Wt 119.0 lb

## 2024-03-01 DIAGNOSIS — Z5112 Encounter for antineoplastic immunotherapy: Secondary | ICD-10-CM | POA: Diagnosis not present

## 2024-03-01 DIAGNOSIS — C642 Malignant neoplasm of left kidney, except renal pelvis: Secondary | ICD-10-CM

## 2024-03-01 LAB — CMP (CANCER CENTER ONLY)
ALT: 18 U/L (ref 0–44)
AST: 20 U/L (ref 15–41)
Albumin: 4.2 g/dL (ref 3.5–5.0)
Alkaline Phosphatase: 99 U/L (ref 38–126)
Anion gap: 11 (ref 5–15)
BUN: 27 mg/dL — ABNORMAL HIGH (ref 8–23)
CO2: 25 mmol/L (ref 22–32)
Calcium: 9.2 mg/dL (ref 8.9–10.3)
Chloride: 100 mmol/L (ref 98–111)
Creatinine: 1.03 mg/dL (ref 0.61–1.24)
GFR, Estimated: >60 mL/min (ref >60)
Glucose, Bld: 111 mg/dL — ABNORMAL HIGH (ref 70–99)
Potassium: 4.1 mmol/L (ref 3.5–5.1)
Sodium: 136 mmol/L (ref 135–145)
Total Bilirubin: <0.2 mg/dL (ref 0.0–1.2)
Total Protein: 6.8 g/dL (ref 6.5–8.1)

## 2024-03-01 LAB — CBC WITH DIFFERENTIAL (CANCER CENTER ONLY)
Abs Immature Granulocytes: 0.02 K/uL (ref 0.00–0.07)
Basophils Absolute: 0 K/uL (ref 0.0–0.1)
Basophils Relative: 1 %
Eosinophils Absolute: 0.6 K/uL — ABNORMAL HIGH (ref 0.0–0.5)
Eosinophils Relative: 15 %
HCT: 29.8 % — ABNORMAL LOW (ref 39.0–52.0)
Hemoglobin: 10 g/dL — ABNORMAL LOW (ref 13.0–17.0)
Immature Granulocytes: 1 %
Lymphocytes Relative: 11 %
Lymphs Abs: 0.5 K/uL — ABNORMAL LOW (ref 0.7–4.0)
MCH: 31.5 pg (ref 26.0–34.0)
MCHC: 33.6 g/dL (ref 30.0–36.0)
MCV: 94 fL (ref 80.0–100.0)
Monocytes Absolute: 0.5 K/uL (ref 0.1–1.0)
Monocytes Relative: 11 %
Neutro Abs: 2.7 K/uL (ref 1.7–7.7)
Neutrophils Relative %: 61 %
Platelet Count: 227 K/uL (ref 150–400)
RBC: 3.17 MIL/uL — ABNORMAL LOW (ref 4.22–5.81)
RDW: 13.3 % (ref 11.5–15.5)
WBC Count: 4.3 K/uL (ref 4.0–10.5)
nRBC: 0 % (ref 0.0–0.2)

## 2024-03-01 LAB — TSH: TSH: 1.44 u[IU]/mL (ref 0.350–4.500)

## 2024-03-01 MED ORDER — SODIUM CHLORIDE 0.9 % IV SOLN
480.0000 mg | Freq: Once | INTRAVENOUS | Status: AC
  Administered 2024-03-01: 480 mg via INTRAVENOUS
  Filled 2024-03-01: qty 48

## 2024-03-01 MED ORDER — SODIUM CHLORIDE 0.9 % IV SOLN: INTRAVENOUS | Status: DC

## 2024-03-01 NOTE — Patient Instructions (Signed)
 [facility] - A DEPT OF [facility]  Discharge Instructions: Thank you for choosing [facility] to provide your oncology and hematology care.   If you have a lab appointment with the Cancer Center, please go directly to the Cancer Center and check in at the registration area.   Wear comfortable clothing and clothing appropriate for easy access to any Portacath or PICC line.   We strive to give you quality time with your provider. You may need to reschedule your appointment if you arrive late (15 or more minutes).  Arriving late affects you and other patients whose appointments are after yours.  Also, if you miss three or more appointments without notifying the office, you may be dismissed from the clinic at the provider's discretion.      For prescription refill requests, have your pharmacy contact our office and allow 72 hours for refills to be completed.    Today you received the following chemotherapy and/or immunotherapy agents: Nivolumab  (opdivo )   To help prevent nausea and vomiting after your treatment, we encourage you to take your nausea medication as directed.  BELOW ARE SYMPTOMS THAT SHOULD BE REPORTED IMMEDIATELY: *FEVER GREATER THAN 100.4 F (38 C) OR HIGHER *CHILLS OR SWEATING *NAUSEA AND VOMITING THAT IS NOT CONTROLLED WITH YOUR NAUSEA MEDICATION *UNUSUAL SHORTNESS OF BREATH *UNUSUAL BRUISING OR BLEEDING *URINARY PROBLEMS (pain or burning when urinating, or frequent urination) *BOWEL PROBLEMS (unusual diarrhea, constipation, pain near the anus) TENDERNESS IN MOUTH AND THROAT WITH OR WITHOUT PRESENCE OF ULCERS (sore throat, sores in mouth, or a toothache) UNUSUAL RASH, SWELLING OR PAIN  UNUSUAL VAGINAL DISCHARGE OR ITCHING   Items with * indicate a potential emergency and should be followed up as soon as possible or go to the Emergency Department if any problems should occur.  Please show the CHEMOTHERAPY ALERT CARD or  IMMUNOTHERAPY ALERT CARD at check-in to the Emergency Department and triage nurse.  Should you have questions after your visit or need to cancel or reschedule your appointment, please contact [facility] - A DEPT OF [facility]  Dept: [phone]  and follow the prompts.  Office hours are 8:00 a.m. to 4:30 p.m. Monday - Friday. Please note that voicemails left after 4:00 p.m. may not be returned until the following business day.  We are closed weekends and major holidays. You have access to a nurse at all times for urgent questions. Please call the main number to the clinic Dept: [phone] and follow the prompts.   For any non-urgent questions, you may also contact your provider using MyChart. We now offer e-Visits for anyone 76 and older to request care online for non-urgent symptoms. For details visit [email or web address].   Also download the MyChart app! Go to the app store, search MyChart, open the app, select [facility], and log in with your MyChart username and password.

## 2024-03-02 ENCOUNTER — Ambulatory Visit: Admitting: Family

## 2024-03-02 ENCOUNTER — Encounter: Payer: Self-pay | Admitting: Family

## 2024-03-02 ENCOUNTER — Ambulatory Visit (HOSPITAL_BASED_OUTPATIENT_CLINIC_OR_DEPARTMENT_OTHER)
Admission: RE | Admit: 2024-03-02 | Discharge: 2024-03-02 | Disposition: A | Discharge status: Home / Self Care | Source: Ambulatory Visit | Attending: Family | Admitting: Family

## 2024-03-02 VITALS — BP 98/40 | HR 95 | Temp 98.4°F | Resp 16 | Ht 73.0 in | Wt 119.0 lb

## 2024-03-02 DIAGNOSIS — R636 Underweight: Secondary | ICD-10-CM

## 2024-03-02 DIAGNOSIS — J189 Pneumonia, unspecified organism: Secondary | ICD-10-CM

## 2024-03-02 DIAGNOSIS — K250 Acute gastric ulcer with hemorrhage: Secondary | ICD-10-CM

## 2024-03-02 DIAGNOSIS — Z681 Body mass index (BMI) 19 or less, adult: Secondary | ICD-10-CM

## 2024-03-02 DIAGNOSIS — R972 Elevated prostate specific antigen [PSA]: Secondary | ICD-10-CM

## 2024-03-02 DIAGNOSIS — C649 Malignant neoplasm of unspecified kidney, except renal pelvis: Secondary | ICD-10-CM

## 2024-03-02 DIAGNOSIS — R21 Rash and other nonspecific skin eruption: Secondary | ICD-10-CM | POA: Insufficient documentation

## 2024-03-02 DIAGNOSIS — M810 Age-related osteoporosis without current pathological fracture: Secondary | ICD-10-CM

## 2024-03-02 LAB — T4: T4, Total: 9.2 ug/dL (ref 4.5–12.0)

## 2024-03-02 NOTE — Assessment & Plan Note (Signed)
 Continues fosamax  and caltrate.

## 2024-03-02 NOTE — Assessment & Plan Note (Addendum)
 Now on pantoprazole. H/H improving. Denies melena.  Monitor.

## 2024-03-02 NOTE — Progress Notes (Signed)
 Subjective:     Patient ID: Ian Hart, male    DOB: Oct 01, 1950, 73 y.o.   MRN: [id number]  Chief Complaint  Patient presents with   Follow-up    HPI  Discussed the use of AI scribe software for clinical note transcription with the patient, who gave verbal consent to proceed.  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 He continues to follow regularly with Oncology, Dr. Sherrod.  Current treatment includes:  nivolumab  480 Mg IV every 4 weeks.  First dose March 01, 2024.   He has a history of a gastric ulcer discovered during an endoscopy on 10/7 while hospitalized. He was prescribed Pantoprazole.  Recent labs showed hemoglobin levels increasing from 8.5 to 10 over the past month, slightly elevated blood sugar, normal sodium, mildly reduced kidney function, and normal liver and thyroid  tests.  He has not seen a urologist recently regarding his elevated PSA. He recalls being seen at [facility] in his kidney care but not his prostate. He continues to take Flomax  and reports good urination.  He reports recent weight loss over the past few weeks, although his appetite remains good. He occasionally skips lunch and drinks two protein shakes daily to supplement his nutrition.  He has a persistent rash on his legs, arms, and back, described as dry skin with 'little pimples' that are itchy. He uses Aveeno for dry skin and takes hydroxyzine for itching, which he sometimes forgets to take, leading to increased itching. He reports that he now takes two hydroxyzine pills at a time instead of one.  His stools have normalized and diarrhea has resolved. He quit smoking and continues to take Fosamax  and Caltrate. He takes salt pills once  a week to manage his sodium levels, which have been low in the past but are currently normal.  Lab Results  Component Value Date   PSA 5.24 (H) 10/06/2023   PSA 4.53 (H) 07/30/2022   PSA 2.31 02/03/2014         Health Maintenance Due  Topic Date Due   Zoster Vaccines- Shingrix  (1 of 2) 06/06/1969   DTaP/Tdap/Td (2 - Td or Tdap) 11/09/2021   COVID-19 Vaccine (6 - 2025-26 season) 12/14/2023    Past Medical History:  Diagnosis Date   Goals of care, counseling/discussion 04/09/2020   History of chicken pox    History of shingles 04/2010   Lung cancer (HCC) 02/2020   Routine general medical examination at a health care facility 11/10/2011   Sepsis with acute organ dysfunction without septic shock (HCC) 01/15/2024    Past Surgical History:  Procedure Laterality Date   BRONCHIAL BIOPSY  03/22/2020   Procedure: BRONCHIAL BIOPSIES;  Surgeon: Brenna Adine CROME, DO;  Location: [facility];  Service: Pulmonary;;   BRONCHIAL BRUSHINGS  03/22/2020   Procedure: BRONCHIAL BRUSHINGS;  Surgeon: Brenna Adine CROME, DO;  Location: [facility];  Service: Pulmonary;;   BRONCHIAL NEEDLE ASPIRATION BIOPSY  03/22/2020   Procedure: BRONCHIAL NEEDLE ASPIRATION BIOPSIES;  Surgeon: Brenna Adine CROME, DO;  Location: [facility];  Service: Pulmonary;;   BRONCHIAL WASHINGS  03/22/2020   Procedure: BRONCHIAL WASHINGS;  Surgeon: Brenna Adine CROME, DO;  Location: [facility];  Service: Pulmonary;;   ESOPHAGOGASTRODUODENOSCOPY N/A 01/19/2024   Procedure:  EGD (ESOPHAGOGASTRODUODENOSCOPY);  Surgeon: Abran [name], MD;  Location: [facility];  Service: Gastroenterology;  Laterality: N/A;   FIDUCIAL MARKER PLACEMENT  03/22/2020   Procedure: FIDUCIAL MARKER PLACEMENT;  Surgeon: Brenna Adine CROME, DO;  Location: [facility];  Service: Pulmonary;;   IR IMAGING GUIDED PORT INSERTION  11/05/2023   IR LUMBAR PUNCTURE  11/05/2023   ROBOTIC ASSITED PARTIAL NEPHRECTOMY Left 2022   VIDEO BRONCHOSCOPY WITH ENDOBRONCHIAL  NAVIGATION N/A 03/22/2020   Procedure: VIDEO BRONCHOSCOPY WITH ENDOBRONCHIAL NAVIGATION;  Surgeon: Brenna Adine CROME, DO;  Location: [facility];  Service: Pulmonary;  Laterality: N/A;   VIDEO BRONCHOSCOPY WITH ENDOBRONCHIAL ULTRASOUND N/A 03/22/2020   Procedure: VIDEO BRONCHOSCOPY WITH ENDOBRONCHIAL ULTRASOUND;  Surgeon: Brenna Adine CROME, DO;  Location: [facility];  Service: Pulmonary;  Laterality: N/A;   VIDEO BRONCHOSCOPY WITH ENDOBRONCHIAL ULTRASOUND Bilateral 11/10/2023   Procedure: BRONCHOSCOPY, WITH EBUS;  Surgeon: Shelah Lamar RAMAN, MD;  Location: [facility];  Service: Cardiopulmonary;  Laterality: Bilateral;    Family History  Problem Relation Age of Onset   Sudden death Mother    Rheumatic fever Mother    COPD Brother    Liver disease Neg Hx    Colon cancer Neg Hx    Esophageal cancer Neg Hx     Social History   Socioeconomic History   Marital status: Married    Spouse name: Not on file   Number of children: 1   Years of education: Not on file   Highest education level: Associate degree: academic program  Occupational History   Occupation: retired  Tobacco Use   Smoking status: Former    Current packs/day: 0.00    Average packs/day: 2.0 packs/day for 50.0 years (100.0 ttl pk-yrs)    Types: Cigarettes    Quit date: 07/13/2021    Years since quitting: 2.6   Smokeless tobacco: Never   Tobacco comments:    07/25/20  Vaping Use   Vaping status: Never Used  Substance and Sexual Activity   Alcohol use: Yes    Alcohol/week: 10.0 standard drinks of alcohol    Types: 10 Standard drinks or equivalent per week    Comment: 2 drinks per night   Drug use: Not Currently   Sexual activity: Yes  Other Topics Concern   Not on file  Social History Narrative   Regular exercise:  2-3 x weekly (sports, yardwork)   Caffeine use:  3 cups coffee daily   51 yr old son   Wife   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            Social Drivers of Corporate Investment Banker Strain: Low Risk  (08/07/2023)   Overall Financial Resource Strain (CARDIA)    Difficulty of Paying Living Expenses: Not hard at all  Food Insecurity: No Food Insecurity (01/15/2024)   Hunger Vital Sign    Worried About Running Out of Food in the Last Year: Never true    Ran Out of Food in the Last Year: Never true  Transportation Needs: No Transportation Needs (01/15/2024)   PRAPARE - Administrator, Civil Service (Medical): No    Lack of Transportation (Non-Medical): No  Physical Activity: Unknown (10/05/2023)   Exercise Vital Sign    Days of Exercise per Week: 6 days    Minutes of Exercise per Session: Not on file  Stress: Stress Concern Present (08/07/2023)   [employer] of Occupational Health - Occupational Stress Questionnaire  Feeling of Stress : To some extent  Social Connections: Unknown (03/02/2024)   Social Connection and Isolation Panel    Frequency of Communication with Friends and Family: Not on file    Frequency of Social Gatherings with Friends and Family: Not on file    Attends Religious Services: Not on file    Active Member of Clubs or Organizations: Yes    Attends Banker Meetings: Not on file    Marital Status: Not on file  Intimate Partner Violence: Not At Risk (01/15/2024)   Humiliation, Afraid, Rape, and Kick questionnaire    Fear of Current or Ex-Partner: No    Emotionally Abused: No    Physically Abused: No    Sexually Abused: No    Outpatient Medications Prior to Visit  Medication Sig Dispense Refill   alendronate  (FOSAMAX ) 70 MG tablet Take 1 tablet (70 mg total) by mouth every 7 (seven) days. Take with a full glass of water on an empty stomach. (Patient taking differently: Take 70 mg by mouth every Friday. Take with a full glass of water on an empty stomach.) 12 tablet 4   Calcium Carbonate-Vit D-Min (CALTRATE 600+D PLUS MINERALS) 600-800 MG-UNIT TABS Take 1 tablet by mouth  in the morning and at bedtime. (Patient taking differently: Take 1 tablet by mouth daily with breakfast.)     CLARITIN 10 MG tablet Take 10 mg by mouth in the morning.     hydrOXYzine (ATARAX) 10 MG tablet Take 1 tablet (10 mg total) by mouth 3 (three) times daily as needed. 90 tablet 1   lidocaine -prilocaine  (EMLA ) cream Apply to affected area once (Patient taking differently: Apply 1 Application topically as needed (for port access).) 30 g 3   LORazepam  (ATIVAN ) 0.5 MG tablet 1 tab po 30 minutes prior to radiation or MRI scans (Patient taking differently: Take 0.5 mg by mouth See admin instructions. Take 0.5 mg by mouth 30 minutes prior to radiation or MRI scans) 10 tablet 0   Multiple Vitamin (MULTIVITAMIN) tablet Take 1 tablet by mouth daily with breakfast.     ondansetron  (ZOFRAN ) 8 MG tablet Take 1 tablet (8 mg total) by mouth every 8 (eight) hours as needed for nausea or vomiting. 30 tablet 1   pantoprazole (PROTONIX) 40 MG tablet Take 1 tablet by mouth twice daily for 4 weeks then take 1 tablet daily 60 tablet 1   prochlorperazine  (COMPAZINE ) 10 MG tablet Take 1 tablet (10 mg total) by mouth every 6 (six) hours as needed for nausea or vomiting. 30 tablet 1   tamsulosin  (FLOMAX ) 0.4 MG CAPS capsule Take 1 capsule (0.4 mg total) by mouth daily. 90 capsule 1   TYLENOL  500 MG tablet Take 500-1,000 mg by mouth every 6 (six) hours as needed (for pain).     Zoster Vaccine Adjuvanted (SHINGRIX ) injection 0.5ml IM now and again in 2-6 months 0.5 mL 1   predniSONE (DELTASONE) 10 MG tablet 4 tabs by mouth once daily for 2 days, then 3 tabs daily x 2 days, then 2 tabs daily x 2 days, then 1 tab daily x 2 days 20 tablet 0   No facility-administered medications prior to visit.    Allergies  Allergen Reactions   Acyclovir And Related Rash and Other (See Comments)    Rash that looked like chicken pox    ROS    See HPI  Objective:    Physical Exam Constitutional:      General: He is not in  acute  distress.    Appearance: He is well-developed. He is cachectic.  HENT:     Head: Normocephalic and atraumatic.  Cardiovascular:     Rate and Rhythm: Normal rate and regular rhythm.     Heart sounds: No murmur heard. Pulmonary:     Effort: Pulmonary effort is normal. No respiratory distress.     Breath sounds: Normal breath sounds. No wheezing or rales.  Skin:    General: Skin is warm and dry.     Comments: Skin is dry few small raised bumps scattered  Neurological:     Mental Status: He is alert and oriented to person, place, and time.  Psychiatric:        Behavior: Behavior normal.        Thought Content: Thought content normal.      BP (!) 98/40 (BP Location: Right Arm, Patient Position: Sitting)   Pulse 95   Temp 98.4 F (36.9 C) (Oral)   Resp 16   Ht 6' 1 (1.854 m)   Wt 119 lb (54 kg)   SpO2 100%   BMI 15.70 kg/m  Wt Readings from Last 3 Encounters:  03/02/24 119 lb (54 kg)  03/01/24 119 lb (54 kg)  02/23/24 122 lb (55.3 kg)       Assessment & Plan:   Problem List Items Addressed This Visit       Unprioritized   Underweight (BMI < 18.5)   Wt Readings from Last 3 Encounters:  03/02/24 119 lb (54 kg)  03/01/24 119 lb (54 kg)  02/23/24 122 lb (55.3 kg)  Weight is low with recent weight loss. Appetite is good, but he sometimes skips lunch. Consuming two protein shakes daily. - Encouraged regular meals, including lunch, to maintain weight. - Instructed to monitor weight and report any further weight loss.      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 Current treatment with hydroxyzine is effective but causes drowsiness. Possible allergic reaction to detergents or soaps. - Recommended Sarna lotion for itching relief. - Advised use of fragrance-free, dye-free detergents and gentle soaps for sensitive skin. - Placed referral to dermatology for further evaluation.      Relevant Orders   Ambulatory referral to Dermatology    Osteoporosis   Continues fosamax  and caltrate.       Metastatic renal cell carcinoma (HCC)   Management per Oncology- currently being treated with nivolumab  480 Mg IV every 4 weeks.      Elevated PSA - Primary   Refer to Urology for further evaluation.       Relevant Orders   Ambulatory referral to Urology   CAP (community acquired pneumonia)   Clinically resolved. Update follow up CXR today.       Acute gastric ulcer with hemorrhage   Now on pantoprazole. H/H improving. Denies melena.  Monitor.     I personally spent a total of 49 minutes in the care of the patient today including getting/reviewing separately obtained history, performing a medically appropriate exam/evaluation, placing orders, documenting clinical information in the EHR, and coordinating care.  Assessment & Plan     I have discontinued Trentin J. Chiem's predniSONE. I am also having him maintain his multivitamin, Shingrix , LORazepam , alendronate , Caltrate 600+D Plus Minerals, tamsulosin , prochlorperazine , ondansetron , lidocaine -prilocaine , Claritin, TYLENOL , pantoprazole, and hydrOXYzine.  No orders of the defined types were placed in this encounter.

## 2024-03-02 NOTE — Patient Instructions (Signed)
  VISIT SUMMARY: During your visit, we discussed your recent health concerns, including your gastric ulcer, elevated PSA levels, chronic rash, weight loss, and follow-up for pneumonia. We also reviewed your current medications and recent lab results.  YOUR PLAN: -ELEVATED PROSTATE SPECIFIC ANTIGEN (PSA): Elevated PSA levels can be a sign of prostate issues, including prostate cancer. We have referred you to a urologist for further evaluation.  -PRURITUS AND CHRONIC RASH: Pruritus means itching, and your chronic rash could be due to an allergic reaction to detergents or soaps. We recommend using Sarna lotion for relief and switching to fragrance-free, dye-free detergents and gentle soaps. We have also referred you to a dermatologist for further evaluation.  -UNDERWEIGHT: Being underweight can affect your overall health. We encourage you to eat regular meals, including lunch, to maintain your weight. Please monitor your weight and let us  know if you continue to lose weight.  -COMMUNITY ACQUIRED PNEUMONIA, FOLLOW-UP: We need to ensure that your pneumonia has fully resolved. A follow-up chest x-ray has been ordered to check your progress.  -AGE-RELATED OSTEOPOROSIS: Osteoporosis is a condition where bones become weak and brittle. Continue taking your Alendronate  and calcium supplements as prescribed to help strengthen your bones.  INSTRUCTIONS: Please follow up with the urologist for your PSA evaluation and the dermatologist for your rash. Continue monitoring your weight and report any further weight loss. Complete the follow-up chest x-ray as ordered.

## 2024-03-02 NOTE — Assessment & Plan Note (Signed)
 Management per Oncology- currently being treated with nivolumab  480 Mg IV every 4 weeks.

## 2024-03-02 NOTE — Assessment & Plan Note (Addendum)
 Wt Readings from Last 3 Encounters:  03/02/24 119 lb (54 kg)  03/01/24 119 lb (54 kg)  02/23/24 122 lb (55.3 kg)  Weight is low with recent weight loss. Appetite is good, but he sometimes skips lunch. Consuming two protein shakes daily. - Encouraged regular meals, including lunch, to maintain weight. - Instructed to monitor weight and report any further weight loss.

## 2024-03-02 NOTE — Assessment & Plan Note (Addendum)
 Clinically resolved. Update follow up CXR today.

## 2024-03-02 NOTE — Assessment & Plan Note (Signed)
 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 Current treatment with hydroxyzine is effective but causes drowsiness. Possible allergic reaction to detergents or soaps. - Recommended Sarna lotion for itching relief. - Advised use of fragrance-free, dye-free detergents and gentle soaps for sensitive skin. - Placed referral to dermatology for further evaluation.

## 2024-03-02 NOTE — Assessment & Plan Note (Signed)
Refer to Urology for further evaluation.

## 2024-03-03 ENCOUNTER — Other Ambulatory Visit: Payer: Self-pay

## 2024-03-03 ENCOUNTER — Other Ambulatory Visit: Payer: Self-pay | Admitting: Radiation Therapy

## 2024-03-03 DIAGNOSIS — C7931 Secondary malignant neoplasm of brain: Secondary | ICD-10-CM

## 2024-03-08 ENCOUNTER — Inpatient Hospital Stay: Admitting: Internal Medicine

## 2024-03-08 ENCOUNTER — Telehealth: Payer: Self-pay

## 2024-03-08 VITALS — BP 104/63 | HR 99 | Temp 98.0°F | Resp 17 | Ht 73.0 in | Wt 119.0 lb

## 2024-03-08 DIAGNOSIS — C649 Malignant neoplasm of unspecified kidney, except renal pelvis: Secondary | ICD-10-CM

## 2024-03-08 DIAGNOSIS — Z5112 Encounter for antineoplastic immunotherapy: Secondary | ICD-10-CM | POA: Diagnosis not present

## 2024-03-08 NOTE — Progress Notes (Signed)
 [facility] Telephone:([phone]   Fax:([phone]  OFFICE PROGRESS NOTE  [name], NP 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    PRIOR THERAPY:  1) Systemic chemotherapy/radiation with cisplatin  80 mg per metered squared on day 1 and etoposide  100 mg per metered squared on days 1, 2, and 3 IV every 3 weeks.  Status post 4 cycles.  Starting from cycle #3, cisplatin  was changed to carboplatin  for an AUC of 5 due to renal insufficiency. First dose on 04/20/20. Onpro was added to the treatment plan starting from cycle #3 due to pancytopenia.  2) First-line treatment with immunotherapy with ipilimumab  1 Mg/KG in addition to nivolumab  360 Mg IV every 3 weeks for 3 cycles.  Cycle #4 was discontinued secondary to intolerance with significant diarrhea and rash. First dose of treatment November 25, 2023.    CURRENT THERAPY: Maintenance treatment with nivolumab  480 Mg IV every 4 weeks.  First dose March 01, 2024.  Status post 1 cycle.  INTERVAL HISTORY: Ian Hart 73 y.o. male returns to the clinic today for follow-up visit.Discussed the use of AI scribe software for clinical note transcription with the patient, who gave verbal consent to proceed.  History of Present Illness Ian Hart is a 73 year old male with metastatic clear cell renal carcinoma who presents with treatment side effects.  He is experiencing persistent pruritus, described as being 'all over' his body, with particular emphasis on his legs. The pruritus has not worsened but remains constant. He is currently taking hydroxyzine , which provides relief for about four to five hours before he needs to  take it again. He is also using hydrocortisone  cream and has recently started using lucerne lotion to help manage the itching.  He has a history of metastatic clear cell renal carcinoma of the left kidney, diagnosed in July 2025, and small cell lung cancer, diagnosed in December 2021. He began first-line immunotherapy with nivolumab  in April 2025, initially receiving it every three weeks for three cycles, followed by maintenance treatment with single-agent nivolumab  every four weeks. He is currently post one cycle of this treatment regimen.    MEDICAL HISTORY: Past Medical History:  Diagnosis Date   Goals of care, counseling/discussion 04/09/2020   History of chicken pox    History of shingles 04/2010   Lung cancer (HCC) 02/2020   Routine general medical examination at a health care facility 11/10/2011   Sepsis with acute organ dysfunction without septic shock (HCC) 01/15/2024    ALLERGIES:  is allergic to acyclovir and related.  MEDICATIONS:  Current Outpatient Medications  Medication Sig Dispense Refill   alendronate  (FOSAMAX ) 70 MG tablet Take 1 tablet (70 mg total) by mouth every 7 (seven) days. Take with a full glass of water on an empty stomach. (Patient taking differently: Take 70 mg by mouth every Friday. Take with a full glass of water on an empty stomach.) 12 tablet 4   Calcium Carbonate-Vit D-Min (CALTRATE 600+D PLUS MINERALS) 600-800 MG-UNIT TABS Take 1 tablet by mouth in the morning and at bedtime. (Patient taking differently: Take 1 tablet by mouth daily with breakfast.)     CLARITIN 10 MG tablet Take 10 mg  by mouth in the morning.     hydrOXYzine  (ATARAX ) 10 MG tablet Take 1 tablet (10 mg total) by mouth 3 (three) times daily as needed. 90 tablet 1   lidocaine -prilocaine  (EMLA ) cream Apply to affected area once (Patient taking differently: Apply 1 Application topically as needed (for port access).) 30 g 3   LORazepam  (ATIVAN ) 0.5 MG tablet 1 tab po 30 minutes prior to  radiation or MRI scans (Patient taking differently: Take 0.5 mg by mouth See admin instructions. Take 0.5 mg by mouth 30 minutes prior to radiation or MRI scans) 10 tablet 0   Multiple Vitamin (MULTIVITAMIN) tablet Take 1 tablet by mouth daily with breakfast.     ondansetron  (ZOFRAN ) 8 MG tablet Take 1 tablet (8 mg total) by mouth every 8 (eight) hours as needed for nausea or vomiting. 30 tablet 1   pantoprazole  (PROTONIX ) 40 MG tablet Take 1 tablet by mouth twice daily for 4 weeks then take 1 tablet daily 60 tablet 1   prochlorperazine  (COMPAZINE ) 10 MG tablet Take 1 tablet (10 mg total) by mouth every 6 (six) hours as needed for nausea or vomiting. 30 tablet 1   tamsulosin  (FLOMAX ) 0.4 MG CAPS capsule Take 1 capsule (0.4 mg total) by mouth daily. 90 capsule 1   TYLENOL  500 MG tablet Take 500-1,000 mg by mouth every 6 (six) hours as needed (for pain).     Zoster Vaccine Adjuvanted (SHINGRIX ) injection 0.5ml IM now and again in 2-6 months 0.5 mL 1   No current facility-administered medications for this visit.    SURGICAL HISTORY:  Past Surgical History:  Procedure Laterality Date   BRONCHIAL BIOPSY  03/22/2020   Procedure: BRONCHIAL BIOPSIES;  Surgeon: Brenna Adine CROME, DO;  Location: [facility];  Service: Pulmonary;;   BRONCHIAL BRUSHINGS  03/22/2020   Procedure: BRONCHIAL BRUSHINGS;  Surgeon: Brenna Adine CROME, DO;  Location: [facility];  Service: Pulmonary;;   BRONCHIAL NEEDLE ASPIRATION BIOPSY  03/22/2020   Procedure: BRONCHIAL NEEDLE ASPIRATION BIOPSIES;  Surgeon: Brenna Adine CROME, DO;  Location: [facility];  Service: Pulmonary;;   BRONCHIAL WASHINGS  03/22/2020   Procedure: BRONCHIAL WASHINGS;  Surgeon: Brenna Adine CROME, DO;  Location: [facility];  Service: Pulmonary;;   ESOPHAGOGASTRODUODENOSCOPY N/A 01/19/2024   Procedure: EGD (ESOPHAGOGASTRODUODENOSCOPY);  Surgeon: Abran [name], MD;  Location: [facility];  Service: Gastroenterology;  Laterality: N/A;   FIDUCIAL MARKER  PLACEMENT  03/22/2020   Procedure: FIDUCIAL MARKER PLACEMENT;  Surgeon: Brenna Adine CROME, DO;  Location: [facility];  Service: Pulmonary;;   IR IMAGING GUIDED PORT INSERTION  11/05/2023   IR LUMBAR PUNCTURE  11/05/2023   ROBOTIC ASSITED PARTIAL NEPHRECTOMY Left 2022   VIDEO BRONCHOSCOPY WITH ENDOBRONCHIAL NAVIGATION N/A 03/22/2020   Procedure: VIDEO BRONCHOSCOPY WITH ENDOBRONCHIAL NAVIGATION;  Surgeon: Brenna Adine CROME, DO;  Location: [facility];  Service: Pulmonary;  Laterality: N/A;   VIDEO BRONCHOSCOPY WITH ENDOBRONCHIAL ULTRASOUND N/A 03/22/2020   Procedure: VIDEO BRONCHOSCOPY WITH ENDOBRONCHIAL ULTRASOUND;  Surgeon: Brenna Adine CROME, DO;  Location: [facility];  Service: Pulmonary;  Laterality: N/A;   VIDEO BRONCHOSCOPY WITH ENDOBRONCHIAL ULTRASOUND Bilateral 11/10/2023   Procedure: BRONCHOSCOPY, WITH EBUS;  Surgeon: Shelah Lamar RAMAN, MD;  Location: [facility];  Service: Cardiopulmonary;  Laterality: Bilateral;    REVIEW OF SYSTEMS:  A comprehensive review of systems was negative except for: Integument/breast: positive for pruritus   PHYSICAL EXAMINATION: General appearance: alert, cooperative, fatigued, and no distress Head: Normocephalic, without obvious abnormality, atraumatic Neck: no adenopathy, no JVD, supple, symmetrical, trachea  midline, and thyroid  not enlarged, symmetric, no tenderness/mass/nodules Lymph nodes: Cervical, supraclavicular, and axillary nodes normal. Resp: clear to auscultation bilaterally Back: symmetric, no curvature. ROM normal. No CVA tenderness. Cardio: regular rate and rhythm, S1, S2 normal, no murmur, click, rub or gallop GI: soft, non-tender; bowel sounds normal; no masses,  no organomegaly Extremities: extremities normal, atraumatic, no cyanosis or edema Neurologic: Alert and oriented X 3, normal strength and tone. Normal symmetric reflexes. Normal coordination and gait  ECOG PERFORMANCE STATUS: 1 - Symptomatic but completely ambulatory  There  were no vitals taken for this visit.  LABORATORY DATA: Lab Results  Component Value Date   WBC 4.3 03/01/2024   HGB 10.0 (L) 03/01/2024   HCT 29.8 (L) 03/01/2024   MCV 94.0 03/01/2024   PLT 227 03/01/2024      Chemistry      Component Value Date/Time   NA 136 03/01/2024 1502   K 4.1 03/01/2024 1502   CL 100 03/01/2024 1502   CO2 25 03/01/2024 1502   BUN 27 (H) 03/01/2024 1502   CREATININE 1.03 03/01/2024 1502   CREATININE 0.86 11/12/2011 0917      Component Value Date/Time   CALCIUM 9.2 03/01/2024 1502   ALKPHOS 99 03/01/2024 1502   AST 20 03/01/2024 1502   ALT 18 03/01/2024 1502   BILITOT <0.2 03/01/2024 1502       RADIOGRAPHIC STUDIES: DG Chest 2 View Result Date: 03/02/2024 CLINICAL DATA:  Follow-up pneumonia.  Small cell lung cancer. EXAM: CHEST - 2 VIEW COMPARISON:  Chest radiograph dated 03/22/2020. FINDINGS: Right-sided Port-A-Cath with tip over central SVC. Near complete clearance of the infiltrate seen right upper lobe infiltrate on the prior radiograph. Faint areas of increased density in the right suprahilar/paratracheal region with associated fiducial marker may represent post treatment changes and fibrosis. Residual infiltrate is not excluded. No focal consolidation, pleural effusion, or pneumothorax. The cardiac silhouette is within limits. Atherosclerotic calcification of the aortic arch. No acute osseous pathology. IMPRESSION: 1. Near complete clearance of the right upper lobe infiltrate. 2. Probable post treatment changes in the right suprahilar/paratracheal region. Electronically Signed   By: Vanetta Chou M.D.   On: 03/02/2024 09:21   MR Lumbar Spine W Wo Contrast Result Date: 02/11/2024 EXAM: MRI THORACIC AND LUMBAR SPINE WITH AND WITHOUT INTRAVENOUS CONTRAST 02/11/2024 01:52:04 PM TECHNIQUE: Multiplanar multisequence MRI of the thoracic and lumbar spine was performed with and without the administration of intravenous contrast. 5 mL (gadobutrol   (GADAVIST ) 1 MMOL/ML injection 5 mL GADOBUTROL  1 MMOL/ML IV SOLN) was administered. COMPARISON: 10/30/2023 CLINICAL HISTORY: Metastatic disease evaluation. FINDINGS: BONES AND ALIGNMENT: Normal alignment. Normal vertebral body heights. Bone marrow signal is unremarkable. No abnormal enhancement. There are 6 lumbar vertebral bodies counting down from the craniocervical junction. The lowest disc space is considered L6-S1. This is different from the numbering applied on the prior MRI. Previously described contrast enhancement of the right T5 pedicle is not evident on this study. SPINAL CORD: Normal spinal cord size. Normal spinal cord signal. Unchanged focus of contrast enhancement along the dorsal aspect of the conus medullaris at the L1 level measuring approximately 4 x 2 mm. SOFT TISSUES: Unremarkable. THORACIC DISC LEVELS: No significant disc herniation. No spinal canal stenosis or neural foraminal narrowing. Enhancement at the T7-T8 right sided perineural cyst is unchanged. LUMBAR DISC LEVELS: Previously seen enhancement of the right L5 nerve root is not evident on this study. L1-L2: No significant disc herniation. No spinal canal stenosis or neural foraminal narrowing. L2-L3: Mild  disc desiccation. No central spinal canal or neural foraminal stenosis. L3-L4: Left asymmetric disc bulge with mild left foraminal stenosis. No spinal canal stenosis. L4-L5: Small left asymmetric disc bulge. No central spinal canal or neural foraminal stenosis. L5-L6: Small disc bulge with endplate spurring. Mild right foraminal stenosis. L6-S1: Unremarkable. IMPRESSION: 1. Unchanged 4 x 2 mm focus of contrast enhancement along the dorsal conus medullaris at L1. 2. 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 3. 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 Mild left L3L4 foraminal stenosis from asymmetric disc bulge, without spinal canal stenosis. 5. Mild right L5L6 foraminal  stenosis from disc bulge with endplate spurring. Electronically signed by: Franky Stanford MD 02/11/2024 04:17 PM EDT RP Workstation: [id number]   MR THORACIC SPINE W WO CONTRAST Result Date: 02/11/2024 EXAM: MRI THORACIC AND LUMBAR SPINE WITH AND WITHOUT INTRAVENOUS CONTRAST 02/11/2024 01:52:04 PM TECHNIQUE: Multiplanar multisequence MRI of the thoracic and lumbar spine was performed with and without the administration of intravenous contrast. 5 mL (gadobutrol  (GADAVIST ) 1 MMOL/ML injection 5 mL GADOBUTROL  1 MMOL/ML IV SOLN) was administered. COMPARISON: 10/30/2023 CLINICAL HISTORY: Metastatic disease evaluation. FINDINGS: BONES AND ALIGNMENT: Normal alignment. Normal vertebral body heights. Bone marrow signal is unremarkable. No abnormal enhancement. There are 6 lumbar vertebral bodies counting down from the craniocervical junction. The lowest disc space is considered L6-S1. This is different from the numbering applied on the prior MRI. Previously described contrast enhancement of the right T5 pedicle is not evident on this study. SPINAL CORD: Normal spinal cord size. Normal spinal cord signal. Unchanged focus of contrast enhancement along the dorsal aspect of the conus medullaris at the L1 level measuring approximately 4 x 2 mm. SOFT TISSUES: Unremarkable. THORACIC DISC LEVELS: No significant disc herniation. No spinal canal stenosis or neural foraminal narrowing. Enhancement at the T7-T8 right sided perineural cyst is unchanged. LUMBAR DISC LEVELS: Previously seen enhancement of the right L5 nerve root is not evident on this study. L1-L2: No significant disc herniation. No spinal canal stenosis or neural foraminal narrowing. L2-L3: Mild disc desiccation. No central spinal canal or neural foraminal stenosis. L3-L4: Left asymmetric disc bulge with mild left foraminal stenosis. No spinal canal stenosis. L4-L5: Small left asymmetric disc bulge. No central spinal canal or neural foraminal stenosis. L5-L6: Small disc  bulge with endplate spurring. Mild right foraminal stenosis. L6-S1: Unremarkable. IMPRESSION: 1. Unchanged 4 x 2 mm focus of contrast enhancement along the dorsal conus medullaris at L1. 2. 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 3. 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 Mild left L3L4 foraminal stenosis from asymmetric disc bulge, without spinal canal stenosis. 5. Mild right L5L6 foraminal stenosis from disc bulge with endplate spurring. Electronically signed by: Franky Stanford MD 02/11/2024 04:17 PM EDT RP Workstation: [id number]   MR CERVICAL SPINE W WO CONTRAST Result Date: 02/11/2024 EXAM: MRI CERVICAL SPINE WITH AND WITHOUT CONTRAST 02/09/2024 02:52:41 PM TECHNIQUE: Multiplanar multisequence MRI of the cervical spine was performed with and without intravenous contrast. 5 mL of gadobutrol  (GADAVIST ) 1 MMOL/ML injection was administered. COMPARISON: MRI of the cervical spine dated 11/03/2023. CLINICAL HISTORY: FINDINGS: BONES AND ALIGNMENT: Normal alignment. Normal vertebral body heights. Bone marrow signal is unremarkable. There is no evidence of metastatic disease to the cervical spine. There is an enhancing lesion again seen posteriorly within the T2 vertebral body which remains concerning for possible metastasis. SPINAL CORD: The spinal cord is normal in morphology and signal intensity. SOFT TISSUES: No paraspinal mass.  C2-C3: No significant disc herniation. No spinal canal stenosis or neural foraminal narrowing. C3-C4: No significant disc herniation. No spinal canal stenosis or neural foraminal narrowing. C4-C5: There is mild disc space narrowing and uncovertebral joint hypertrophy at C4-C5 with mild central spinal canal stenosis and bilateral neural foraminal stenosis. C5-C6: There is mild disc space narrowing and uncovertebral joint hypertrophy at C5-C6 with mild central spinal canal stenosis and bilateral neural foraminal stenosis.  C6-C7: No significant disc herniation. No spinal canal stenosis or neural foraminal narrowing. C7-T1: No significant disc herniation. No spinal canal stenosis or neural foraminal narrowing. IMPRESSION: 1. No evidence of metastatic disease to the cervical spine. 2. Enhancing lesion again seen posteriorly within the T2 vertebral body, concerning for possible metastasis; correlation with clinical history and consideration of targeted evaluation recommended. 3. Mild disc space narrowing and uncovertebral joint hypertrophy at C4-5 and C5-6 with mild central spinal canal stenosis and bilateral neural foraminal stenosis at each level. Electronically signed by: Evalene Coho MD 02/11/2024 05:29 AM EDT RP Workstation: [id number]   MR Brain W Wo Contrast Result Date: 02/11/2024 EXAM: MRI BRAIN WITH AND WITHOUT CONTRAST 02/09/2024 02:50:50 PM TECHNIQUE: Multiplanar multisequence MRI of the head/brain was performed with and without the administration of intravenous contrast. 5 mL of gadobutrol  (GADAVIST ) 1 MMOL/ML injection was administered. COMPARISON: MRI of the head dated 11/03/2023. CLINICAL HISTORY: FINDINGS: BRAIN AND VENTRICLES: There has been a mixed response to interval therapy with overall improvement of metastatic disease to the cerebellar metastases and marginal worsening of metastatic disease to the cerebral hemispheres. Regarding the cerebellar metastases, residual lesions are seen on image 121 of series 1800 within the right cerebellar hemisphere and image 140 within the left cerebellar hemisphere. The other previously described cerebellar lesions are no longer apparent. Regarding the cerebral hemispheres, there appears to be a new metastatic lesion within the right occipital lobe on image 171. There is also a lesion within the right temporal lobe on image 157 which in retrospect was present previously, but is now more conspicuous. A ring-enhancing lesion previously noted within the right temporal lobe on  image 171 has increased in diameter from 4 mm to 6 mm. There is a lesion within the left frontal lobe on image 201 which is now well visualized but was also faintly evident on the previous study. Similarly, there is a lesion within the right frontal lobe on image 206, which was faintly visible previously and is now more conspicuous. A re-enhancing lesion previously noted within the left occipital lobe on image 162 has decreased in size and is no longer ring-enhancing. A lesion present medially within the left occipital lobe on image 175 is also less conspicuous than on the previous study. There is a residual lesion within the right parietal lobe on image 215 and within the left frontal lobe on the same image. There is also a lesion again demonstrated within the right frontal lobe on image 228. No acute infarct. No acute intracranial hemorrhage. No mass effect or midline shift. No hydrocephalus. The sella is unremarkable. Normal flow voids. ORBITS: No acute abnormality. SINUSES: No acute abnormality. BONES AND SOFT TISSUES: Normal bone marrow signal and enhancement. No acute soft tissue abnormality. IMPRESSION: 1. Mixed response to interval therapy with overall improvement of metastatic disease in the cerebellar hemispheres and marginal worsening in the cerebral hemispheres. 2. New metastatic lesion within the right occipital lobe. 3. Increased size of the ring-enhancing lesion within the right temporal lobe from 4 mm to 6 mm. 4. Decreased size  of the lesion within the left occipital lobe, which is no longer ring-enhancing. Electronically signed by: Evalene Coho MD 02/11/2024 05:20 AM EDT RP Workstation: [id number]     ASSESSMENT AND PLAN: This is a very pleasant 73 years old white male diagnosed with poorly differentiated carcinoma consistent with adenocarcinoma in the right upper lobe.  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  The patient also has a  suspicious left renal lesion concerning for renal cell carcinoma. He is status post a course of systemic chemotherapy initially with cisplatin  and etoposide  but the cisplatin  was discontinued secondary to renal insufficiency and the patient continued 3 more cycles of his systemic chemotherapy with carboplatin  and etoposide  concurrent with radiation.   He had evidence for disease recurrence with metastatic brain lesions in addition to right hilar lymphadenopathy treated with SRS as well as palliative radiotherapy to the right hilar area. He had a PET scan performed recently that showed evidence for disease progression in the mediastinal lymph nodes as well as right adrenal metastasis. He had bronchoscopy done recently by Dr. Shelah and the final pathology from the 2R and 4L lymph nodes showed malignant cells consistent with renal cell carcinoma. He underwent treatment with immunotherapy with ipilimumab  1 mg/KG in addition to nivolumab  360 mg IV every 3 weeks.  Status post 3 cycle. This treatment was discontinued secondary to immunotherapy mediated diarrhea and skin rash. He started first cycle of maintenance treatment with single agent nivolumab  480 mg IV every 4 weeks on March 01, 2024. He tolerated the first cycle of his treatment fairly well except for the baseline itching. Assessment and Plan Assessment & Plan Metastatic clear cell carcinoma of left kidney Diagnosed in July 2025. Currently on maintenance treatment with single-agent nivolumab  every four weeks. Post one cycle of treatment. - Continue nivolumab  every four weeks.  Pruritus secondary to immunotherapy Persistent pruritus likely secondary to immunotherapy with nivolumab . Symptoms are generalized and managed with hydroxyzine  and hydrocortisone  cream. Hydroxyzine  provides relief for approximately four to five hours. He is using Sarna lotion as recommended by his nurse practitioner. - Continue hydroxyzine  for pruritus. - Continue  hydrocortisone  cream for localized itching. - Use Lucerne lotion to maintain skin moisture and avoid dryness. - Avoid scented or chemically laden products.  Anemia, improving Anemia is improving as evidenced by current blood count results. He was advised to call immediately if he has any other concerning symptoms in the interval.  The patient voices understanding of current disease status and treatment options and is in agreement with the current care plan.  All questions were answered. The patient knows to call the clinic with any problems, questions or concerns. We can certainly see the patient much sooner if necessary. The total time spent in the appointment was 20 minutes including review of chart and various tests results, discussions about plan of care and coordination of care plan .   Disclaimer: This note was dictated with voice recognition software. Similar sounding words can inadvertently be transcribed and may not be corrected upon review.

## 2024-03-08 NOTE — Telephone Encounter (Signed)
Attempted to call patient on primary number regarding today's appointment. No answer. Left voicemail providing patient with clinic number to reschedule appointments.

## 2024-03-14 ENCOUNTER — Other Ambulatory Visit: Payer: Self-pay | Admitting: Radiation Therapy

## 2024-03-14 DIAGNOSIS — C7931 Secondary malignant neoplasm of brain: Secondary | ICD-10-CM

## 2024-03-16 ENCOUNTER — Other Ambulatory Visit (HOSPITAL_COMMUNITY): Payer: Self-pay

## 2024-03-16 ENCOUNTER — Other Ambulatory Visit (HOSPITAL_BASED_OUTPATIENT_CLINIC_OR_DEPARTMENT_OTHER): Payer: Self-pay

## 2024-03-17 NOTE — Progress Notes (Signed)
  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    First Treatment Date: 2024-02-24 Last Treatment Date: 2024-02-24   Plan Name: Brain_SRS Site: Brain PTV_29_Rt_Temporal_2mm PTV_30_Rt_Parietal_8mm PTV_31_Rt_Frontal_66mm Technique: SBRT/SRT-IMRT Mode: Photon Dose Per Fraction: 20 Gy Prescribed Dose (Delivered / Prescribed): 20 Gy / 20 Gy Prescribed Fxs (Delivered / Prescribed): 1 / 1   The patient {WAS/WAS NOT:[phone]::was not} available for call today.  The patient {Desc; did/not:[id number]} note fatigue during radiation. The patient {Desc; did/not:[id number]} note hair loss or skin changes in the field of radiation during therapy. The patient {ACTION; IS/IS WNU:[id number]} taking dexamethasone . The patient {DOES_DOES WNU:81435} have symptoms of  weakness or loss of control of the extremities. The patient {DOES_DOES WNU:81435} have symptoms of headache. The patient {DOES_DOES WNU:81435} have symptoms of seizure or uncontrolled movement. The patient {DOES_DOES WNU:81435} have symptoms of changes in vision. The patient {DOES_DOES WNU:81435} have changes in speech. The patient {DOES_DOES WNU:81435} have confusion.   The patient was counseled that he  will be contacted by our brain and spine navigator to schedule surveillance imaging. The patient was encouraged to call if  {he/she/they:23295} have not received a call to schedule imaging, or if {he/she/they:23295} develop concerns or questions regarding radiation. The patient will also continue to follow up with Dr. PIERRETTE in medical oncology.

## 2024-03-27 NOTE — Progress Notes (Unsigned)
 [facility] OFFICE PROGRESS NOTE  [name], NP 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  PRIOR THERAPY: Systemic chemotherapy/radiation with cisplatin  80 mg per metered squared on day 1 and etoposide  100 mg per metered squared on days 1, 2, and 3 IV every 3 weeks.  Status post 4 cycles.  Starting from cycle #3, cisplatin  was changed to carboplatin  for an AUC of 5 due to renal insufficiency. First dose on 04/20/20. Onpro was added to the treatment plan starting from cycle #3 due to pancytopenia.  Radiation to the right lung, completed on 06/01/20  PCI under the care of Dr. Dewey. Completed on 08/31/20  Robotic nephrectomy on 01/28/21 under the care of Dr. Tommi at [facility] under the care of Dr. Dewey for a SRS to the metastatic brain lesions, completed on 01/30/23  Radiation to L1 and L5 under the care of Dr. Dewey. Last dose on 12/08/23 8) First-line treatment with immunotherapy with ipilimumab  1 Mg/KG in addition to nivolumab  360 Mg IV every 3 weeks for 3 cycles.  Cycle #4 was discontinued secondary to intolerance with significant diarrhea and rash. First dose of treatment November 25, 2023.  9_ SRS to 3 additional brain metastases under the care of Dr. Dewey, last day on 02/23/14.  CURRENT THERAPY: Maintenance treatment with nivolumab  480 Mg IV every 4 weeks.  First dose March 01, 2024.  Status post 2 cycles.   INTERVAL HISTORY: Ian Hart 73 y.o. male returns to the clinic today for a follow up visit. The patient was last seen by Dr. Sherrod on 03/08/24.   The patient is currently on single agent immunotherapy with nivolumab  for his metastatic renal cell  carcinoma of the left kidney. He tolerates this well except for itching. He takes hydroxyzine . However, he states that his itching is hardly noticeable anymore.   His BP is low today. He did not eat breakfast. He did have one ensure. He denies lightheadedness or dizziness. He does not take any antihypertensives. He only drinks ensure once a day. He is schedule to see a member of the nutritionist team while in the infusion room. He is very thin and cachetic. He previously had improved appetite but has not been as hungry the last 3 days or so. He had one episode of nausea/vomiting however it was self limiting so he did not take his anti-emetic.   He denies ongoing nausea, diarrhea, constipation, abdominal pain, or back pain.  He frequently feels cold but denies fevers or chills. He denies headaches, vision changes, or neurological deficits.  He states his breathing is fine. He denies chest pain, cough, or hemoptysis.   He denies diarrhea or constipation.  He has previously seen a member of the nutritionist team. He denies headaches or vision changes. He denies abdominal pain or back pain.He is scheduled for repeat brain MRI and lumbar spine MRI in late January 2026. He is here for evaluation and repeat blood work before undergoing cycle #6 which is single agent nivolumab .     MEDICAL HISTORY: Past Medical History:  Diagnosis Date   Goals of care, counseling/discussion 04/09/2020   History of chicken pox    History of shingles 04/2010  Lung cancer (HCC) 02/2020   Routine general medical examination at a health care facility 11/10/2011   Sepsis with acute organ dysfunction without septic shock (HCC) 01/15/2024    ALLERGIES:  is allergic to acyclovir and related.  MEDICATIONS:  Current Outpatient Medications  Medication Sig Dispense Refill   alendronate  (FOSAMAX ) 70 MG tablet Take 1 tablet (70 mg total) by mouth every 7 (seven) days. Take with a full glass of water on an empty stomach.  (Patient taking differently: Take 70 mg by mouth every Friday. Take with a full glass of water on an empty stomach.) 12 tablet 4   Calcium Carbonate-Vit D-Min (CALTRATE 600+D PLUS MINERALS) 600-800 MG-UNIT TABS Take 1 tablet by mouth in the morning and at bedtime. (Patient taking differently: Take 1 tablet by mouth daily with breakfast.)     CLARITIN 10 MG tablet Take 10 mg by mouth in the morning.     hydrOXYzine  (ATARAX ) 10 MG tablet Take 1 tablet (10 mg total) by mouth 3 (three) times daily as needed. 90 tablet 1   lidocaine -prilocaine  (EMLA ) cream Apply to affected area once (Patient taking differently: Apply 1 Application topically as needed (for port access).) 30 g 3   LORazepam  (ATIVAN ) 0.5 MG tablet 1 tab po 30 minutes prior to radiation or MRI scans (Patient taking differently: Take 0.5 mg by mouth See admin instructions. Take 0.5 mg by mouth 30 minutes prior to radiation or MRI scans) 10 tablet 0   Multiple Vitamin (MULTIVITAMIN) tablet Take 1 tablet by mouth daily with breakfast.     ondansetron  (ZOFRAN ) 8 MG tablet Take 1 tablet (8 mg total) by mouth every 8 (eight) hours as needed for nausea or vomiting. 30 tablet 1   pantoprazole  (PROTONIX ) 40 MG tablet Take 1 tablet by mouth twice daily for 4 weeks then take 1 tablet daily 60 tablet 1   prochlorperazine  (COMPAZINE ) 10 MG tablet Take 1 tablet (10 mg total) by mouth every 6 (six) hours as needed for nausea or vomiting. 30 tablet 1   tamsulosin  (FLOMAX ) 0.4 MG CAPS capsule Take 1 capsule (0.4 mg total) by mouth daily. 90 capsule 1   TYLENOL  500 MG tablet Take 500-1,000 mg by mouth every 6 (six) hours as needed (for pain).     Zoster Vaccine Adjuvanted (SHINGRIX ) injection 0.5ml IM now and again in 2-6 months 0.5 mL 1   No current facility-administered medications for this visit.    SURGICAL HISTORY:  Past Surgical History:  Procedure Laterality Date   BRONCHIAL BIOPSY  03/22/2020   Procedure: BRONCHIAL BIOPSIES;  Surgeon: Brenna Adine CROME, DO;  Location: [facility];  Service: Pulmonary;;   BRONCHIAL BRUSHINGS  03/22/2020   Procedure: BRONCHIAL BRUSHINGS;  Surgeon: Brenna Adine CROME, DO;  Location: [facility];  Service: Pulmonary;;   BRONCHIAL NEEDLE ASPIRATION BIOPSY  03/22/2020   Procedure: BRONCHIAL NEEDLE ASPIRATION BIOPSIES;  Surgeon: Brenna Adine CROME, DO;  Location: [facility];  Service: Pulmonary;;   BRONCHIAL WASHINGS  03/22/2020   Procedure: BRONCHIAL WASHINGS;  Surgeon: Brenna Adine CROME, DO;  Location: [facility];  Service: Pulmonary;;   ESOPHAGOGASTRODUODENOSCOPY N/A 01/19/2024   Procedure: EGD (ESOPHAGOGASTRODUODENOSCOPY);  Surgeon: Abran [name], MD;  Location: [facility];  Service: Gastroenterology;  Laterality: N/A;   FIDUCIAL MARKER PLACEMENT  03/22/2020   Procedure: FIDUCIAL MARKER PLACEMENT;  Surgeon: Brenna Adine CROME, DO;  Location: [facility];  Service: Pulmonary;;   IR IMAGING GUIDED PORT INSERTION  11/05/2023   IR LUMBAR PUNCTURE  11/05/2023  ROBOTIC ASSITED PARTIAL NEPHRECTOMY Left 2022   VIDEO BRONCHOSCOPY WITH ENDOBRONCHIAL NAVIGATION N/A 03/22/2020   Procedure: VIDEO BRONCHOSCOPY WITH ENDOBRONCHIAL NAVIGATION;  Surgeon: Brenna Adine CROME, DO;  Location: [facility];  Service: Pulmonary;  Laterality: N/A;   VIDEO BRONCHOSCOPY WITH ENDOBRONCHIAL ULTRASOUND N/A 03/22/2020   Procedure: VIDEO BRONCHOSCOPY WITH ENDOBRONCHIAL ULTRASOUND;  Surgeon: Brenna Adine CROME, DO;  Location: [facility];  Service: Pulmonary;  Laterality: N/A;   VIDEO BRONCHOSCOPY WITH ENDOBRONCHIAL ULTRASOUND Bilateral 11/10/2023   Procedure: BRONCHOSCOPY, WITH EBUS;  Surgeon: Shelah Lamar RAMAN, MD;  Location: [facility];  Service: Cardiopulmonary;  Laterality: Bilateral;    REVIEW OF SYSTEMS:   Review of Systems  Constitutional: Positive for fatigue, weight loss, and appetite change.  Negative for chills and fever.  HENT: Negative for mouth sores, nosebleeds, sore throat and trouble swallowing.   Eyes: Negative  for eye problems and icterus.  Respiratory: Stable shortness of breath. Negative for cough, hemoptysis, shortness of breath and wheezing.   Cardiovascular: Negative for chest pain and leg swelling.  Gastrointestinal: No abdominal pain at this time. Negative for constipation, diarrhea, or nausea/vomiting at this time.  Genitourinary: Negative for bladder incontinence, difficulty urinating, dysuria, frequency and hematuria.   Musculoskeletal: Negative for back pain, gait problem, neck pain and neck stiffness.  Skin: Improved itching. Negative for rash.  Neurological: Negative for dizziness, extremity weakness, gait problem, headaches, light-headedness and seizures.  Hematological: Negative for adenopathy. Does not bruise/bleed easily.  Psychiatric/Behavioral: Negative for confusion, depression and sleep disturbance. The patient is not nervous/anxious.     PHYSICAL EXAMINATION:  Blood pressure (!) 88/58, pulse (!) 108, temperature 98.3 F (36.8 C), temperature source Temporal, resp. rate 20, weight 117 lb 11.2 oz (53.4 kg), SpO2 100%.  ECOG PERFORMANCE STATUS: 2  Physical Exam  Constitutional: Oriented to person, place, and time and cachetic appearing male, and in no distress.  HENT:  Head: Normocephalic and atraumatic.  Mouth/Throat: Oropharynx is clear and moist. No oropharyngeal exudate.  Eyes: Conjunctivae are normal. Right eye exhibits no discharge. Left eye exhibits no discharge. No scleral icterus.  Neck: Normal range of motion. Neck supple.  Cardiovascular: Tachycardic, regular rhythm, normal heart sounds and intact distal pulses.   Pulmonary/Chest: Effort normal and breath sounds normal. No respiratory distress. No wheezes. No rales.  Abdominal: Soft. Bowel sounds are normal. Exhibits no distension and no mass. There is no tenderness.  Musculoskeletal: Normal range of motion. Exhibits no edema.  Lymphadenopathy:    No cervical adenopathy.  Neurological: Alert and oriented to  person, place, and time. Exhibits muscle wasting. Gait normal. Coordination normal.  Skin: Skin is warm and dry. No rash noted. Not diaphoretic. No erythema. No pallor.  Psychiatric: Mood, memory and judgment normal.  Vitals reviewed.  LABORATORY DATA: Lab Results  Component Value Date   WBC 6.0 03/29/2024   HGB 11.8 (L) 03/29/2024   HCT 35.1 (L) 03/29/2024   MCV 90.7 03/29/2024   PLT 158 03/29/2024      Chemistry      Component Value Date/Time   NA 136 03/01/2024 1502   K 4.1 03/01/2024 1502   CL 100 03/01/2024 1502   CO2 25 03/01/2024 1502   BUN 27 (H) 03/01/2024 1502   CREATININE 1.03 03/01/2024 1502   CREATININE 0.86 11/12/2011 0917      Component Value Date/Time   CALCIUM 9.2 03/01/2024 1502   ALKPHOS 99 03/01/2024 1502   AST 20 03/01/2024 1502   ALT 18 03/01/2024 1502   BILITOT <0.2 03/01/2024  1502       RADIOGRAPHIC STUDIES:  DG Chest 2 View Result Date: 03/02/2024 CLINICAL DATA:  Follow-up pneumonia.  Small cell lung cancer. EXAM: CHEST - 2 VIEW COMPARISON:  Chest radiograph dated 03/22/2020. FINDINGS: Right-sided Port-A-Cath with tip over central SVC. Near complete clearance of the infiltrate seen right upper lobe infiltrate on the prior radiograph. Faint areas of increased density in the right suprahilar/paratracheal region with associated fiducial marker may represent post treatment changes and fibrosis. Residual infiltrate is not excluded. No focal consolidation, pleural effusion, or pneumothorax. The cardiac silhouette is within limits. Atherosclerotic calcification of the aortic arch. No acute osseous pathology. IMPRESSION: 1. Near complete clearance of the right upper lobe infiltrate. 2. Probable post treatment changes in the right suprahilar/paratracheal region. Electronically Signed   By: Vanetta Chou M.D.   On: 03/02/2024 09:21     ASSESSMENT/PLAN:  This is a very pleasant 73 year old Caucasian male diagnosed with poorly differentiated carcinoma  consistent with adenocarcinoma in the right upper lobe.  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  The patient also has a suspicious left renal lesion concerning for renal cell carcinoma. He is status post a course of systemic chemotherapy initially with cisplatin  and etoposide  but the cisplatin  was discontinued secondary to renal insufficiency and the patient continued 3 more cycles of his systemic chemotherapy with carboplatin  and etoposide  concurrent with radiation.   He completed PCI under the care of Dr. Dewey 08/31/20.  He had evidence for disease recurrence with metastatic brain lesions in addition to right hilar lymphadenopathy treated with SRS as well as palliative radiotherapy to the right hilar area. For the left renal mass, he underwent partial left nephrectomy at [facility] and it was consistent with papillary renal cell carcinoma type I with nuclear grade 2 and negative margin.  He had a PET scan performed recently that showed evidence for disease progression in the mediastinal lymph nodes as well as right adrenal metastasis. He also underwent radiation to the lumbar spine under the care of Dr. Dewey which was completed on 12/08/23.  He had bronchoscopy done recently by Dr. Shelah and the final pathology from the 2R and 4L lymph nodes showed malignant cells consistent with renal cell carcinoma.   He was on treatment with immunotherapy with ipilimumab  1 mg/KG in addition to nivolumab  360 mg IV every 3 weeks.  He had intolerance to the combination.   Therefore, he is currently on single agent nivolumab . He is status post 2 cycles of single agent nivolumab .   Labs were reviewed. Recommend he proceed with cycle #3 of single agent nivolumab . Reviewed his pulse and BP with Dr. Sherrod and the plan.   We will arrange for 1 L of IVF today and recheck his BP and pulse.   We supplied him with food here. He is scheduled to see a  nutritionist today. Encouraged to increase his ensure intake to at least 2 per day.   We will see him back in 4 weeks before undergoing the next cycle of treatment.   He recently had SRS to the 3 new metastatic brain lesions under the care of Dr. Dewey. This was completed on 02/24/24.   I will arrange for a restaging CT CAP prior to his next cycle of treatment.   He is also scheduled for repeat MRI of the spine and brain MRI next month.   He was advised to check his BP closely  at home.   The patient was advised to call immediately if she has any concerning symptoms in the interval. The patient voices understanding of current disease status and treatment options and is in agreement with the current care plan. All questions were answered. The patient knows to call the clinic with any problems, questions or concerns. We can certainly see the patient much sooner if necessary      Orders Placed This Encounter  Procedures   CT CHEST ABDOMEN PELVIS WO CONTRAST    Standing Status:   Future    Expected Date:   04/19/2024    Expiration Date:   03/29/2025    Preferred imaging location?:   [facility]    If indicated for the ordered procedure, I authorize the administration of oral contrast media per Radiology protocol:   Yes    Does the patient have a contrast media/X-ray dye allergy?:   No     The total time spent in the appointment was 30-39 minutes.  Brennyn Haisley L Tova Vater, PA-C 03/29/2024

## 2024-03-28 ENCOUNTER — Other Ambulatory Visit: Payer: Self-pay | Admitting: Radiation Therapy

## 2024-03-28 ENCOUNTER — Ambulatory Visit
Admission: RE | Admit: 2024-03-28 | Discharge: 2024-03-28 | Disposition: A | Source: Ambulatory Visit | Attending: Radiation Oncology

## 2024-03-28 DIAGNOSIS — C3411 Malignant neoplasm of upper lobe, right bronchus or lung: Secondary | ICD-10-CM

## 2024-03-28 DIAGNOSIS — C7931 Secondary malignant neoplasm of brain: Secondary | ICD-10-CM

## 2024-03-28 DIAGNOSIS — C649 Malignant neoplasm of unspecified kidney, except renal pelvis: Secondary | ICD-10-CM

## 2024-03-29 ENCOUNTER — Inpatient Hospital Stay: Attending: Radiation Oncology

## 2024-03-29 ENCOUNTER — Inpatient Hospital Stay: Admitting: Physician Assistant

## 2024-03-29 ENCOUNTER — Inpatient Hospital Stay

## 2024-03-29 ENCOUNTER — Other Ambulatory Visit: Payer: Self-pay | Admitting: Physician Assistant

## 2024-03-29 ENCOUNTER — Other Ambulatory Visit: Payer: Self-pay

## 2024-03-29 ENCOUNTER — Inpatient Hospital Stay: Admitting: Dietician

## 2024-03-29 VITALS — BP 88/58 | HR 108 | Temp 98.3°F | Resp 20 | Wt 117.7 lb

## 2024-03-29 VITALS — BP 110/81 | HR 85 | Resp 18

## 2024-03-29 DIAGNOSIS — C7931 Secondary malignant neoplasm of brain: Secondary | ICD-10-CM | POA: Diagnosis present

## 2024-03-29 DIAGNOSIS — C3491 Malignant neoplasm of unspecified part of right bronchus or lung: Secondary | ICD-10-CM | POA: Diagnosis not present

## 2024-03-29 DIAGNOSIS — C642 Malignant neoplasm of left kidney, except renal pelvis: Secondary | ICD-10-CM | POA: Insufficient documentation

## 2024-03-29 DIAGNOSIS — C649 Malignant neoplasm of unspecified kidney, except renal pelvis: Secondary | ICD-10-CM

## 2024-03-29 DIAGNOSIS — Z5112 Encounter for antineoplastic immunotherapy: Secondary | ICD-10-CM | POA: Insufficient documentation

## 2024-03-29 DIAGNOSIS — Z79620 Long term (current) use of immunosuppressive biologic: Secondary | ICD-10-CM | POA: Insufficient documentation

## 2024-03-29 DIAGNOSIS — Z681 Body mass index (BMI) 19 or less, adult: Secondary | ICD-10-CM

## 2024-03-29 DIAGNOSIS — R636 Underweight: Secondary | ICD-10-CM | POA: Diagnosis not present

## 2024-03-29 DIAGNOSIS — R5383 Other fatigue: Secondary | ICD-10-CM

## 2024-03-29 DIAGNOSIS — I959 Hypotension, unspecified: Secondary | ICD-10-CM

## 2024-03-29 DIAGNOSIS — C7971 Secondary malignant neoplasm of right adrenal gland: Secondary | ICD-10-CM | POA: Diagnosis not present

## 2024-03-29 LAB — CMP (CANCER CENTER ONLY)
ALT: 27 U/L (ref 0–44)
AST: 34 U/L (ref 15–41)
Albumin: 4.1 g/dL (ref 3.5–5.0)
Alkaline Phosphatase: 109 U/L (ref 38–126)
Anion gap: 12 (ref 5–15)
BUN: 30 mg/dL — ABNORMAL HIGH (ref 8–23)
CO2: 23 mmol/L (ref 22–32)
Calcium: 8.8 mg/dL — ABNORMAL LOW (ref 8.9–10.3)
Chloride: 98 mmol/L (ref 98–111)
Creatinine: 1.49 mg/dL — ABNORMAL HIGH (ref 0.61–1.24)
GFR, Estimated: 49 mL/min — ABNORMAL LOW (ref >60)
Glucose, Bld: 131 mg/dL — ABNORMAL HIGH (ref 70–99)
Potassium: 4.3 mmol/L (ref 3.5–5.1)
Sodium: 132 mmol/L — ABNORMAL LOW (ref 135–145)
Total Bilirubin: 0.2 mg/dL (ref 0.0–1.2)
Total Protein: 6.9 g/dL (ref 6.5–8.1)

## 2024-03-29 LAB — CBC WITH DIFFERENTIAL (CANCER CENTER ONLY)
Abs Immature Granulocytes: 0.01 K/uL (ref 0.00–0.07)
Basophils Absolute: 0 K/uL (ref 0.0–0.1)
Basophils Relative: 0 %
Eosinophils Absolute: 0.1 K/uL (ref 0.0–0.5)
Eosinophils Relative: 1 %
HCT: 35.1 % — ABNORMAL LOW (ref 39.0–52.0)
Hemoglobin: 11.8 g/dL — ABNORMAL LOW (ref 13.0–17.0)
Immature Granulocytes: 0 %
Lymphocytes Relative: 5 %
Lymphs Abs: 0.3 K/uL — ABNORMAL LOW (ref 0.7–4.0)
MCH: 30.5 pg (ref 26.0–34.0)
MCHC: 33.6 g/dL (ref 30.0–36.0)
MCV: 90.7 fL (ref 80.0–100.0)
Monocytes Absolute: 0.6 K/uL (ref 0.1–1.0)
Monocytes Relative: 11 %
Neutro Abs: 5 K/uL (ref 1.7–7.7)
Neutrophils Relative %: 83 %
Platelet Count: 158 K/uL (ref 150–400)
RBC: 3.87 MIL/uL — ABNORMAL LOW (ref 4.22–5.81)
RDW: 12.7 % (ref 11.5–15.5)
WBC Count: 6 K/uL (ref 4.0–10.5)
nRBC: 0 % (ref 0.0–0.2)

## 2024-03-29 LAB — TSH: TSH: 1.75 u[IU]/mL (ref 0.350–4.500)

## 2024-03-29 LAB — SAMPLE TO BLOOD BANK

## 2024-03-29 MED ORDER — SODIUM CHLORIDE 0.9 % IV SOLN: INTRAVENOUS | Status: DC

## 2024-03-29 MED ORDER — SODIUM CHLORIDE 0.9 % IV SOLN
480.0000 mg | Freq: Once | INTRAVENOUS | Status: AC
  Administered 2024-03-29: 10:00:00 480 mg via INTRAVENOUS
  Filled 2024-03-29: qty 48

## 2024-03-29 MED ORDER — SODIUM CHLORIDE 0.9 % IV SOLN: Freq: Once | INTRAVENOUS | Status: AC

## 2024-03-29 NOTE — Patient Instructions (Signed)
 [facility] - A DEPT OF [facility]  Discharge Instructions: Thank you for choosing [facility] to provide your oncology and hematology care.   If you have a lab appointment with the Cancer Center, please go directly to the Cancer Center and check in at the registration area.   Wear comfortable clothing and clothing appropriate for easy access to any Portacath or PICC line.   We strive to give you quality time with your provider. You may need to reschedule your appointment if you arrive late (15 or more minutes).  Arriving late affects you and other patients whose appointments are after yours.  Also, if you miss three or more appointments without notifying the office, you may be dismissed from the clinic at the provider's discretion.      For prescription refill requests, have your pharmacy contact our office and allow 72 hours for refills to be completed.    Today you received the following chemotherapy and/or immunotherapy agents: Nivolumab  (opdivo )   To help prevent nausea and vomiting after your treatment, we encourage you to take your nausea medication as directed.  BELOW ARE SYMPTOMS THAT SHOULD BE REPORTED IMMEDIATELY: *FEVER GREATER THAN 100.4 F (38 C) OR HIGHER *CHILLS OR SWEATING *NAUSEA AND VOMITING THAT IS NOT CONTROLLED WITH YOUR NAUSEA MEDICATION *UNUSUAL SHORTNESS OF BREATH *UNUSUAL BRUISING OR BLEEDING *URINARY PROBLEMS (pain or burning when urinating, or frequent urination) *BOWEL PROBLEMS (unusual diarrhea, constipation, pain near the anus) TENDERNESS IN MOUTH AND THROAT WITH OR WITHOUT PRESENCE OF ULCERS (sore throat, sores in mouth, or a toothache) UNUSUAL RASH, SWELLING OR PAIN  UNUSUAL VAGINAL DISCHARGE OR ITCHING   Items with * indicate a potential emergency and should be followed up as soon as possible or go to the Emergency Department if any problems should occur.  Please show the CHEMOTHERAPY ALERT CARD or  IMMUNOTHERAPY ALERT CARD at check-in to the Emergency Department and triage nurse.  Should you have questions after your visit or need to cancel or reschedule your appointment, please contact [facility] - A DEPT OF [facility]  Dept: [phone]  and follow the prompts.  Office hours are 8:00 a.m. to 4:30 p.m. Monday - Friday. Please note that voicemails left after 4:00 p.m. may not be returned until the following business day.  We are closed weekends and major holidays. You have access to a nurse at all times for urgent questions. Please call the main number to the clinic Dept: [phone] and follow the prompts.   For any non-urgent questions, you may also contact your provider using MyChart. We now offer e-Visits for anyone 76 and older to request care online for non-urgent symptoms. For details visit [email or web address].   Also download the MyChart app! Go to the app store, search MyChart, open the app, select [facility], and log in with your MyChart username and password.

## 2024-03-29 NOTE — Progress Notes (Signed)
 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 Patient is followed by Dr. Sherrod. He has been receiving Yervoy  and nivolumab  - stopped due to toxicity. S/p SRS for brain mets under the care of Dr. Dewey. Patient currently receiving maintenance nivolumab  (start 11/18).   Met with patient in infusion. Eating applesauce at visit. Patient reports appetite hasn't been so hot the last few days. He is unsure why, but says he needs to do better. Patient is drinking Equate Plus (likes strawberry) or CIB daily. He likes these and agreeable to increase if meal/snacks are not appealing. Patient tolerating single nivolumab . Diarrhea has resolved. Denies nausea or vomiting.    Medications: reviewed   Labs: Na 132, glucose 131, BUN 30, Cr 1.49 (IVF + treatment)   Anthropometrics: Wt 117 lb 11.2 oz today   11/25 - 119 lb  11/4 - 128 lb 4 oz (?) 10/15 - 116 lb  9/25 - 108 lb 14.4 oz     NUTRITION DIAGNOSIS: Unintended wt loss - continues    MALNUTRITION DIAGNOSIS: Severe malnutrition ongoing   INTERVENTION:  Encourage high calorie high protein snacks when appetite is low - snack ideas provided  Continue drinking Ensure Plus/equivalent - pt agreeable to increase 2/day (KF 1.4 strawberry sample provided + Ensure coupons)    MONITORING, EVALUATION, GOAL: wt trends, intake   NEXT VISIT: Tuesday January 13 during infusion

## 2024-03-30 LAB — T4: T4, Total: 10 ug/dL (ref 4.5–12.0)

## 2024-04-18 ENCOUNTER — Encounter: Payer: Self-pay | Admitting: Dermatology

## 2024-04-18 ENCOUNTER — Ambulatory Visit (INDEPENDENT_AMBULATORY_CARE_PROVIDER_SITE_OTHER): Admitting: Dermatology

## 2024-04-18 VITALS — BP 113/71

## 2024-04-18 DIAGNOSIS — L299 Pruritus, unspecified: Secondary | ICD-10-CM | POA: Diagnosis not present

## 2024-04-18 DIAGNOSIS — L251 Unspecified contact dermatitis due to drugs in contact with skin: Secondary | ICD-10-CM

## 2024-04-18 DIAGNOSIS — L270 Generalized skin eruption due to drugs and medicaments taken internally: Secondary | ICD-10-CM

## 2024-04-18 MED ORDER — CLOBETASOL PROPIONATE 0.05 % EX CREA: TOPICAL_CREAM | CUTANEOUS | 5 refills | Status: AC

## 2024-04-18 NOTE — Progress Notes (Signed)
" ° °  New Patient Visit   Subjective  Ian Hart is a 74 y.o. male who presents for a NEW PATIENT appointment to be examined for the concerns as listed below.   Rash: Pt stated that he has a rash located at the L lower back, L arm & B/L legs that started to occur a little over 2 months ago that he would like evaluated. The rash is itchy - rating it 7/10. He has been taking Claritin10mg  daily, applying OTC Hydrocortisone  cream & taking Hydroxyzine  10mg  daily to manage the itch. He stated if he does not take or forgets to take the oral tablets it acts up.   Pt mentioned that he is under the care of oncologist Dr. Sherrod for lung cancer being treated with Nivolumab  480 Mg IV every 4 weeks. First dose March 01, 2024.    The following portions of the chart were reviewed this encounter and updated as appropriate: medications, allergies, medical history  Review of Systems:  No other skin or systemic complaints except as noted in HPI or Assessment and Plan.  Objective  Well appearing patient in no apparent distress; mood and affect are within normal limits.   A focused examination was performed of the following areas: scattered   Relevant exam findings are noted in the Assessment and Plan.        Assessment & Plan   Drug-induced skin eruption with Pruritus Persistent pink papular, pruritic rash on the back, arms, and legs, likely due to immunotherapy. Rash began in October, correlating with the initiation of two immunotherapy drugs. One drug was discontinued due to the rash, and he is currently on nivolumab  every four weeks. The rash has improved since stopping the first drug but persists mildly. The rash is expected to be chronic due to ongoing nivolumab  treatment.  - Continue nivolumab  every four weeks. - Apply clobetasol  cream twice daily for two weeks, then pause for two weeks if rash persists. Repeat cycle until rash resolves. - Use CeraVe anti-itch lotion daily for  moisturizing. - Provided samples of CeraVe anti-itch lotion and clobetasol  cream. - Instructed on proper application of clobetasol  using fingertip units. - Follow up as needed, with a re-evaluation in two months if no improvement.   No follow-ups on file.   Documentation: I have reviewed the above documentation for accuracy and completeness, and I agree with the above.  I, Shirron Maranda, CMA II, am acting as scribe for:   Delon Lenis, DO     "

## 2024-04-18 NOTE — Patient Instructions (Addendum)
 " VISIT SUMMARY:  You visited us  today due to a persistent drug-induced rash that began in October after starting two immunotherapy drugs. One of these drugs was stopped, which has improved the rash, but it still persists. You are currently receiving nivolumab  infusions every four weeks for your cancer treatment.  YOUR PLAN:  -DRUG-INDUCED SKIN ERUPTION:  A drug-induced skin eruption is a rash caused by a reaction to a medication. Your rash is likely due to the immunotherapy drugs you are taking for your cancer treatment. We will continue your nivolumab  infusions every four weeks.   For the rash, apply clobetasol  cream twice daily for two weeks, then pause for two weeks if the rash persists, and repeat this cycle until the rash resolves.   Use CeraVe anti-itch lotion daily to help with moisturizing. We provided you with samples of both the CeraVe lotion and clobetasol  cream, and instructed you on the proper application of clobetasol  using fingertip units. You have five refills of clobetasol  cream available.  INSTRUCTIONS:  Please follow up as needed, and schedule a re-evaluation in two months if there is no improvement in your rash.   Important Information  Due to recent changes in healthcare laws, you may see results of your pathology and/or laboratory studies on MyChart before the doctors have had a chance to review them. We understand that in some cases there may be results that are confusing or concerning to you. Please understand that not all results are received at the same time and often the doctors may need to interpret multiple results in order to provide you with the best plan of care or course of treatment. Therefore, we ask that you please give us  2 business days to thoroughly review all your results before contacting the office for clarification. Should we see a critical lab result, you will be contacted sooner.   If You Need Anything After Your Visit  If you have any questions  or concerns for your doctor, please call our main line at [phone] If no one answers, please leave a voicemail as directed and we will return your call as soon as possible. Messages left after 4 pm will be answered the following business day.   You may also send us  a message via MyChart. We typically respond to MyChart messages within 1-2 business days.  For prescription refills, please ask your pharmacy to contact our office. Our fax number is [phone].  If you have an urgent issue when the clinic is closed that cannot wait until the next business day, you can page your doctor at the number below.    Please note that while we do our best to be available for urgent issues outside of office hours, we are not available 24/7.   If you have an urgent issue and are unable to reach us , you may choose to seek medical care at your doctor's office, retail clinic, urgent care center, or emergency room.  If you have a medical emergency, please immediately call 911 or go to the emergency department. In the event of inclement weather, please call our main line at [phone] for an update on the status of any delays or closures.  Dermatology Medication Tips: Please keep the boxes that topical medications come in in order to help keep track of the instructions about where and how to use these. Pharmacies typically print the medication instructions only on the boxes and not directly on the medication tubes.   If your medication is too expensive, please  contact our office at [phone] or send us  a message through MyChart.   We are unable to tell what your co-pay for medications will be in advance as this is different depending on your insurance coverage. However, we may be able to find a substitute medication at lower cost or fill out paperwork to get insurance to cover a needed medication.   If a prior authorization is required to get your medication covered by your insurance company, please allow  us  1-2 business days to complete this process.  Drug prices often vary depending on where the prescription is filled and some pharmacies may offer cheaper prices.  The website www.goodrx.com contains coupons for medications through different pharmacies. The prices here do not account for what the cost may be with help from insurance (it may be cheaper with your insurance), but the website can give you the price if you did not use any insurance.  - You can print the associated coupon and take it with your prescription to the pharmacy.  - You may also stop by our office during regular business hours and pick up a GoodRx coupon card.  - If you need your prescription sent electronically to a different pharmacy, notify our office through [facility] or by phone at [phone]     "

## 2024-04-19 ENCOUNTER — Ambulatory Visit (HOSPITAL_COMMUNITY)
Admission: RE | Admit: 2024-04-19 | Discharge: 2024-04-19 | Disposition: A | Discharge status: Home / Self Care | Source: Ambulatory Visit | Attending: Physician Assistant | Admitting: Physician Assistant

## 2024-04-19 DIAGNOSIS — C649 Malignant neoplasm of unspecified kidney, except renal pelvis: Secondary | ICD-10-CM | POA: Diagnosis present

## 2024-04-19 DIAGNOSIS — C3491 Malignant neoplasm of unspecified part of right bronchus or lung: Secondary | ICD-10-CM | POA: Insufficient documentation

## 2024-04-20 ENCOUNTER — Other Ambulatory Visit: Payer: Self-pay | Admitting: Radiation Oncology

## 2024-04-21 ENCOUNTER — Encounter: Payer: Self-pay | Admitting: Physician Assistant

## 2024-04-21 ENCOUNTER — Encounter: Payer: Self-pay | Admitting: Radiation Oncology

## 2024-04-23 ENCOUNTER — Other Ambulatory Visit: Payer: Self-pay

## 2024-04-23 NOTE — Progress Notes (Unsigned)
 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  PRIOR THERAPY: Systemic chemotherapy/radiation with cisplatin  80 mg per metered squared on day 1 and etoposide  100 mg per metered squared on days 1, 2, and 3 IV every 3 weeks.  Status post 4 cycles.  Starting from cycle #3, cisplatin  was changed to carboplatin  for an AUC of 5 due to renal insufficiency. First dose on 04/20/20. Onpro was added to the treatment plan starting from cycle #3 due to pancytopenia.  Radiation to the right lung, completed on 06/01/20  PCI under the care of Dr. Dewey. Completed on 08/31/20  Robotic nephrectomy on 01/28/21 under the care of Dr. Tommi at [facility]  [facility] under the care of Dr. Dewey for a SRS to the metastatic brain lesions, completed on 01/30/23  Radiation to L1 and L5 under the care of Dr. Dewey. Last dose on 12/08/23 8) First-line treatment with immunotherapy with ipilimumab  1 Mg/KG in addition to nivolumab  360 Mg IV every 3 weeks for 3 cycles.  Cycle #4 was discontinued secondary to intolerance with significant diarrhea and rash. First dose of treatment November 25, 2023.  9_ SRS to 3 additional brain metastases under the care of Dr. Dewey, last day on 02/23/14.  CURRENT THERAPY: Maintenance treatment with nivolumab  480 Mg IV every 4 weeks.  First dose March 01, 2024.  Status post 6 cycles.   INTERVAL HISTORY: Ian Hart 74 y.o. male returns to the clinic today for a follow up visit. The patient was last seen by myself on 03/29/24.   The patient is currently on single agent immunotherapy with nivolumab  for his metastatic renal cell  carcinoma of the left kidney. He tolerates this well except for itching. However, he states that his itching is hardly noticeable anymore.   He saw dermatology for his drug rash and was given clobetasol  to use BID and to use CeraVe anti-itch lotion daily for moisturizing.   He only drinks ensure once a day. He is schedule to see a member of the nutritionist team while in the infusion room. He is very thin and cachetic. He did actually gain 3 lbs since last being seen.    He denies ongoing nausea, diarrhea, constipation, abdominal pain, or back pain.   He frequently feels cold but denies fevers or chills. He denies headaches, vision changes, or neurological deficits.   He states his breathing is good. He denies chest pain, cough, or hemoptysis.   He denies headaches or vision changes. He is scheduled for repeat brain MRI and lumbar spine MRI in late January 2026. He recently had a restaging CT scan. He is here for evaluation and repeat blood work before undergoing cycle #7 which is single agent nivolumab      MEDICAL HISTORY: Past Medical History:  Diagnosis Date   Anxiety    Blood transfusion without reported diagnosis    Chronic kidney disease    Depression Sept 2021   sometimes   Goals of care, counseling/discussion 04/09/2020   History of chicken pox    History of shingles 04/2010   Lung cancer (HCC) 02/2020   Routine general medical examination at a health care facility  11/10/2011   Sepsis with acute organ dysfunction without septic shock (HCC) 01/15/2024    ALLERGIES:  is allergic to acyclovir and related.  MEDICATIONS:  Current Outpatient Medications  Medication Sig Dispense Refill   alendronate  (FOSAMAX ) 70 MG tablet Take 1 tablet (70 mg total) by mouth every 7 (seven) days. Take with a full glass of water on an empty stomach. (Patient taking differently: Take 70 mg by mouth every Friday. Take with a full glass of water on an empty stomach.) 12 tablet 4   Calcium  Carbonate-Vit D-Min (CALTRATE 600+D PLUS MINERALS) 600-800 MG-UNIT TABS Take 1 tablet by mouth in the morning and at bedtime. (Patient taking differently: Take 1 tablet by mouth daily with breakfast.)     CLARITIN 10 MG tablet Take 10 mg by mouth in the morning.     clobetasol  cream (TEMOVATE ) 0.05 % Use BID on affected areas for 2 weks on, 2 weeks off. 60 g 5   hydrOXYzine  (ATARAX ) 10 MG tablet Take 1 tablet (10 mg total) by mouth 3 (three) times daily as needed. 90 tablet 1   lidocaine -prilocaine  (EMLA ) cream Apply to affected area once (Patient taking differently: Apply 1 Application topically as needed (for port access).) 30 g 3   LORazepam  (ATIVAN ) 0.5 MG tablet 1 tab po 30 minutes prior to radiation or MRI scans (Patient taking differently: Take 0.5 mg by mouth See admin instructions. Take 0.5 mg by mouth 30 minutes prior to radiation or MRI scans) 10 tablet 0   Multiple Vitamin (MULTIVITAMIN) tablet Take 1 tablet by mouth daily with breakfast.     ondansetron  (ZOFRAN ) 8 MG tablet Take 1 tablet (8 mg total) by mouth every 8 (eight) hours as needed for nausea or vomiting. 30 tablet 1   pantoprazole  (PROTONIX ) 40 MG tablet Take 1 tablet by mouth twice daily for 4 weeks then take 1 tablet daily 60 tablet 1   prochlorperazine  (COMPAZINE ) 10 MG tablet Take 1 tablet (10 mg total) by mouth every 6 (six) hours as needed for nausea or vomiting. 30 tablet 1   tamsulosin  (FLOMAX ) 0.4 MG CAPS capsule Take 1 capsule (0.4 mg total) by mouth daily. 90 capsule 1   TYLENOL  500 MG tablet Take 500-1,000 mg by mouth every 6 (six) hours as needed (for pain).     Zoster Vaccine Adjuvanted (SHINGRIX ) injection 0.5ml IM now and again in 2-6 months 0.5 mL 1   No current facility-administered medications for this visit.    SURGICAL HISTORY:  Past Surgical History:  Procedure Laterality Date   BRONCHIAL BIOPSY  03/22/2020   Procedure: BRONCHIAL BIOPSIES;  Surgeon: Brenna Adine CROME, DO;  Location: [facility];   Service: Pulmonary;;   BRONCHIAL BRUSHINGS  03/22/2020   Procedure: BRONCHIAL BRUSHINGS;  Surgeon: Brenna Adine CROME, DO;  Location: [facility];  Service: Pulmonary;;   BRONCHIAL NEEDLE ASPIRATION BIOPSY  03/22/2020   Procedure: BRONCHIAL NEEDLE ASPIRATION BIOPSIES;  Surgeon: Brenna Adine CROME, DO;  Location: [facility];  Service: Pulmonary;;   BRONCHIAL WASHINGS  03/22/2020   Procedure: BRONCHIAL WASHINGS;  Surgeon: Brenna Adine CROME, DO;  Location: [facility];  Service: Pulmonary;;   ESOPHAGOGASTRODUODENOSCOPY N/A 01/19/2024   Procedure: EGD (ESOPHAGOGASTRODUODENOSCOPY);  Surgeon: Abran [name], MD;  Location: [facility];  Service: Gastroenterology;  Laterality: N/A;   FIDUCIAL MARKER PLACEMENT  03/22/2020   Procedure: FIDUCIAL MARKER PLACEMENT;  Surgeon: Brenna Adine CROME, DO;  Location: [facility];  Service: Pulmonary;;   IR IMAGING GUIDED PORT INSERTION  11/05/2023  IR LUMBAR PUNCTURE  11/05/2023   ROBOTIC ASSITED PARTIAL NEPHRECTOMY Left 2022   VIDEO BRONCHOSCOPY WITH ENDOBRONCHIAL NAVIGATION N/A 03/22/2020   Procedure: VIDEO BRONCHOSCOPY WITH ENDOBRONCHIAL NAVIGATION;  Surgeon: Brenna Adine CROME, DO;  Location: [facility];  Service: Pulmonary;  Laterality: N/A;   VIDEO BRONCHOSCOPY WITH ENDOBRONCHIAL ULTRASOUND N/A 03/22/2020   Procedure: VIDEO BRONCHOSCOPY WITH ENDOBRONCHIAL ULTRASOUND;  Surgeon: Brenna Adine CROME, DO;  Location: [facility];  Service: Pulmonary;  Laterality: N/A;   VIDEO BRONCHOSCOPY WITH ENDOBRONCHIAL ULTRASOUND Bilateral 11/10/2023   Procedure: BRONCHOSCOPY, WITH EBUS;  Surgeon: Shelah Lamar RAMAN, MD;  Location: [facility];  Service: Cardiopulmonary;  Laterality: Bilateral;    REVIEW OF SYSTEMS:   Review of Systems  Constitutional: Stable fatigue. Negative for appetite change, chills, fatigue, fever and unexpected weight change.  HENT: Negative for mouth sores, nosebleeds, sore throat and trouble swallowing.   Eyes: Negative for eye problems and icterus.   Respiratory: Stable baselien shortness of breath. Negative for cough, hemoptysis, shortness of breath and wheezing.    Cardiovascular: Negative for chest pain and leg swelling.  Gastrointestinal: Negative for abdominal pain, constipation, diarrhea, nausea and vomiting.  Genitourinary: Negative for bladder incontinence, difficulty urinating, dysuria, frequency and hematuria.   Musculoskeletal: Negative for back pain, gait problem, neck pain and neck stiffness.  Skin: Improved itching. Negative for rash.  Neurological: Negative for dizziness, extremity weakness, gait problem, headaches, light-headedness and seizures.  Hematological: Negative for adenopathy. Does not bruise/bleed easily.  Psychiatric/Behavioral: Negative for confusion, depression and sleep disturbance. The patient is not nervous/anxious.     PHYSICAL EXAMINATION:  Blood pressure (!) 96/56, pulse 81, temperature 97.8 F (36.6 C), temperature source Oral, resp. rate 16, height 6' 1 (1.854 m), weight 120 lb 12.8 oz (54.8 kg), SpO2 98%.  ECOG PERFORMANCE STATUS: 2  Physical Exam  Constitutional: Oriented to person, place, and time and cachetic appearing male, and in no distress.  HENT:  Head: Normocephalic and atraumatic.  Mouth/Throat: Oropharynx is clear and moist. No oropharyngeal exudate.  Eyes: Conjunctivae are normal. Right eye exhibits no discharge. Left eye exhibits no discharge. No scleral icterus.  Neck: Normal range of motion. Neck supple.  Cardiovascular: Tachycardic, regular rhythm, normal heart sounds and intact distal pulses.   Pulmonary/Chest: Effort normal and breath sounds normal. No respiratory distress. No wheezes. No rales.  Abdominal: Soft. Bowel sounds are normal. Exhibits no distension and no mass. There is no tenderness.  Musculoskeletal: Normal range of motion. Exhibits no edema.  Lymphadenopathy:    No cervical adenopathy.  Neurological: Alert and oriented to person, place, and time. Exhibits  muscle wasting. Gait normal. Coordination normal.  Skin: Skin is warm and dry. No rash noted. Not diaphoretic. No erythema. No pallor.  Psychiatric: Mood, memory and judgment normal.  Vitals reviewed.  LABORATORY DATA: Lab Results  Component Value Date   WBC 5.4 04/26/2024   HGB 10.1 (L) 04/26/2024   HCT 30.6 (L) 04/26/2024   MCV 90.5 04/26/2024   PLT 188 04/26/2024      Chemistry      Component Value Date/Time   NA 136 04/26/2024 0938   K 4.4 04/26/2024 0938   CL 101 04/26/2024 0938   CO2 25 04/26/2024 0938   BUN 24 (H) 04/26/2024 0938   CREATININE 1.05 04/26/2024 0938   CREATININE 0.86 11/12/2011 0917      Component Value Date/Time   CALCIUM 8.7 (L) 04/26/2024 0938   ALKPHOS 105 04/26/2024 0938   AST 21 04/26/2024 0938   ALT 14  04/26/2024 0938   BILITOT 0.2 04/26/2024 9061       RADIOGRAPHIC STUDIES:  CT CHEST ABDOMEN PELVIS WO CONTRAST Result Date: 04/25/2024 CLINICAL DATA:  Restaging metastatic small cell lung cancer. * Tracking Code: BO * EXAM: CT CHEST, ABDOMEN AND PELVIS WITHOUT CONTRAST TECHNIQUE: Multidetector CT imaging of the chest, abdomen and pelvis was performed following the standard protocol without IV contrast. RADIATION DOSE REDUCTION: This exam was performed according to the departmental dose-optimization program which includes automated exposure control, adjustment of the mA and/or kV according to patient size and/or use of iterative reconstruction technique. COMPARISON:  PET-CT 10/22/2023 FINDINGS: CT CHEST FINDINGS Cardiovascular: The heart is normal in size. No pericardial effusion. The aorta is normal in caliber. Stable atherosclerotic calcifications at the aortic arch. Stable coronary artery calcifications. Right IJ Port-A-Cath is in good position without complicating features. Mediastinum/Nodes: Enlarging high right paratracheal lymph node on image 13/2. This measures 18 mm and previously measured 15 mm. Left-sided AP window node measures 5 mm and  previously measured 8 mm. Precarinal node measures 9 mm and previously measured 9 mm. Difficult to measure any hilar nodes without contrast. No subcarinal adenopathy. The esophagus is grossly normal. Lungs/Pleura: Stable severe emphysematous changes and areas of pulmonary scarring. Stable post treatment changes in the right upper lobe medially with paramediastinal radiation fibrosis. The small nodular density adjacent to the fiducials measures 8 mm and is unchanged. New irregular solid-appearing lesion in the left lower lobe measures 17 mm on image 69/6. Findings certainly worrisome for a metachronous neoplasm. It is possible this is some type of inflammatory or infectious lesion. A few tiny scattered nodules appears stable and may be part of a tree-in-bud type process due to chronic inflammation or atypical infection such as MAC. No pleural effusions or pleural lesions. Progressive linear scarring changes in the left lower lobe. Musculoskeletal: No chest wall mass, supraclavicular or axillary adenopathy. Moderate cachexia noted. The bony structures are intact. No lytic or sclerotic bone lesions to suggest metastatic bone disease. CT ABDOMEN PELVIS FINDINGS Hepatobiliary: No hepatic lesions are identified without contrast. No intrahepatic biliary dilatation. There is a new lentiform shaped subcapsular fluid collection and segment 5. I suspect this related to prior hepatic injury but was not present on prior studies. It measures approximately 4.1 cm. Pancreas: No mass, inflammation or ductal dilatation. Spleen: Normal size.  No focal lesions. Adrenals/Urinary Tract: No adrenal gland lesions. No worrisome renal lesions or hydronephrosis. The bladder has not unusual shape no bladder lesions. Stomach/Bowel: The stomach, duodenum, small bowel and colon are grossly normal without oral contrast. No inflammatory changes, mass lesions or obstructive findings. The appendix is normal. Vascular/Lymphatic: Stable aortic and  iliac artery calcifications. No aneurysm. No obvious mesenteric or retroperitoneal adenopathy. Reproductive: Mild prostate gland enlargement. The seminal vesicles are unremarkable. Other: No pelvic mass or adenopathy. No free pelvic fluid collections. No inguinal mass or adenopathy. No abdominal wall hernia or subcutaneous lesions. Musculoskeletal: No significant bony findings. IMPRESSION: 1. Stable post treatment changes in the right upper lobe medially with paramediastinal radiation fibrosis. The small nodular density adjacent to the fiducials is unchanged. 2. New irregular solid-appearing lesion in the left lower lobe measures 17 mm. Findings certainly worrisome for a metachronous neoplasm. It is possible this is some type of inflammatory or infectious lesion. Recommend either close CT surveillance (repeat noncontrast chest CT in 3 months) or PET-CT. 3. Enlarging high right paratracheal lymph node (positive on prior PET-CT). Other mediastinal lymph nodes are stable or smaller. 4.  No findings for abdominal/pelvic metastatic disease. 5. New lentiform shaped subcapsular fluid collection in segment 5 of the liver. I suspect this is related to prior hepatic injury but was not present on prior studies. 6. Stable severe emphysematous changes and areas of pulmonary scarring. 7. Aortic atherosclerosis. Aortic Atherosclerosis (ICD10-I70.0) and Emphysema (ICD10-J43.9). Electronically Signed   By: MYRTIS Stammer M.D.   On: 04/25/2024 17:21     ASSESSMENT/PLAN:  This is a very pleasant 74 year old Caucasian male diagnosed with poorly differentiated carcinoma consistent with adenocarcinoma in the right upper lobe.  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  The patient also has a suspicious left renal lesion concerning for renal cell carcinoma. He is status post a course of systemic chemotherapy initially with cisplatin  and etoposide  but the cisplatin  was discontinued  secondary to renal insufficiency and the patient continued 3 more cycles of his systemic chemotherapy with carboplatin  and etoposide  concurrent with radiation.   He completed PCI under the care of Dr. Dewey 08/31/20.  He had evidence for disease recurrence with metastatic brain lesions in addition to right hilar lymphadenopathy treated with SRS as well as palliative radiotherapy to the right hilar area. 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  He had a PET scan performed recently that showed evidence for disease progression in the mediastinal lymph nodes as well as right adrenal metastasis. He also underwent radiation to the lumbar spine under the care of Dr. Dewey which was completed on 12/08/23.  He had bronchoscopy done recently by Dr. Shelah and the final pathology from the 2R and 4L lymph nodes showed malignant cells consistent with renal cell carcinoma.   He was on treatment with immunotherapy with ipilimumab  1 mg/KG in addition to nivolumab  360 mg IV every 3 weeks.  He had intolerance to the combination.    Therefore, he is currently on single agent nivolumab . He is status post 3 cycles of single agent nivolumab .   The patient was seen with Dr. Sherrod today.  Dr. Sherrod personally and independently reviewed the scan and discussed results with the patient today.  The scan showed New irregular solid-appearing lesion in the left lower lobe measures 17 mm. Findings certainly worrisome for a metachronous neoplasm. It is possible this is some type of inflammatory or infectious lesion. Dr. Sherrod reviewed the images.  Dr. Sherrod recommends close monitoring of the lesion. Given the appearance it could be infectious or inflammatory.    Labs were reviewed. Recommend he proceed with cycle #4 of single agent nivolumab . He is ok to treat with his BP  of 99/56    He is scheduled to see a nutritionist today. Encouraged to increase his ensure intake to at least 2 per day.   We will see him back in 4 weeks before undergoing the next cycle of treatment.    He recently had SRS to the 3 new metastatic brain lesions under the care of Dr. Dewey. This was completed on 02/24/24.   He is also scheduled for repeat MRI of the spine and brain MRI this month on 05/11/24.    He was advised to check his BP closely at home.   The patient was advised to call immediately if she has any concerning symptoms in the interval. The patient voices understanding of current disease  status and treatment options and is in agreement with the current care plan. All questions were answered. The patient knows to call the clinic with any problems, questions or concerns. We can certainly see the patient much sooner if necessary   No orders of the defined types were placed in this encounter.    Saliha Salts L Flannery Cavallero, PA-C 04/26/2024  ADDENDUM: Hematology/Oncology Attending: I had a face-to-face encounter with the patient today.  I reviewed his record, lab, scan and recommended his care plan.  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  He also has a history of a small cell lung cancer diagnosed in December 2021 and poorly differentiated adenocarcinoma of the right upper lobe also in December 2021.  Status posttreatment for the lung cancer and currently on observation but for the renal cell carcinoma he started first-line treatment with immunotherapy with ipilimumab  and nivolumab  every 3 weeks for 3 cycles discontinued secondary to significant diarrhea and rash.  He is currently on maintenance treatment with single agent nivolumab  480 mg IV every 4 weeks status post 6 cycles.  He has been tolerating this treatment much better with less the rash and itching. He had repeat CT scan  of the chest, abdomen and pelvis performed recently.  I personally and independently reviewed the scan images and discussed the result and showed the images to the patient and his wife.  His scan showed stable disease but there was a new irregular solid appearing lesion in the left lower lobe measuring 1.7 cm again concerning for metachronous neoplasm but inflammatory process could not be completely excluded at this point. I recommended for the patient to continue his current treatment with immunotherapy and will continue to monitor discontinue nodule in his left lung closely as well as the enlarging lymphadenopathy. The patient will proceed with cycle #7 of his maintenance treatment today. I will see him back for follow-up visit in 4 weeks for evaluation before starting cycle #8. We may consider repeating his imaging studies after cycle #8. He was advised to call immediately if he has any other concerning symptoms in the interval. Disclaimer: This note was dictated with voice recognition software. Similar sounding words can inadvertently be transcribed and may be missed upon review. Sherrod MARLA Sherrod, MD

## 2024-04-26 ENCOUNTER — Inpatient Hospital Stay

## 2024-04-26 ENCOUNTER — Inpatient Hospital Stay: Attending: Radiation Oncology | Admitting: Dietician

## 2024-04-26 ENCOUNTER — Inpatient Hospital Stay: Attending: Radiation Oncology | Admitting: Physician Assistant

## 2024-04-26 ENCOUNTER — Inpatient Hospital Stay: Admitting: Dietician

## 2024-04-26 ENCOUNTER — Inpatient Hospital Stay: Attending: Radiation Oncology

## 2024-04-26 VITALS — BP 94/55 | HR 79 | Temp 97.8°F | Resp 16

## 2024-04-26 VITALS — BP 96/56 | HR 81 | Temp 97.8°F | Resp 16 | Ht 73.0 in | Wt 120.8 lb

## 2024-04-26 DIAGNOSIS — Z5112 Encounter for antineoplastic immunotherapy: Secondary | ICD-10-CM | POA: Insufficient documentation

## 2024-04-26 DIAGNOSIS — C7971 Secondary malignant neoplasm of right adrenal gland: Secondary | ICD-10-CM | POA: Insufficient documentation

## 2024-04-26 DIAGNOSIS — C649 Malignant neoplasm of unspecified kidney, except renal pelvis: Secondary | ICD-10-CM | POA: Diagnosis not present

## 2024-04-26 DIAGNOSIS — C642 Malignant neoplasm of left kidney, except renal pelvis: Secondary | ICD-10-CM | POA: Diagnosis present

## 2024-04-26 DIAGNOSIS — C7931 Secondary malignant neoplasm of brain: Secondary | ICD-10-CM | POA: Insufficient documentation

## 2024-04-26 DIAGNOSIS — C3491 Malignant neoplasm of unspecified part of right bronchus or lung: Secondary | ICD-10-CM

## 2024-04-26 DIAGNOSIS — Z79899 Other long term (current) drug therapy: Secondary | ICD-10-CM | POA: Insufficient documentation

## 2024-04-26 LAB — CMP (CANCER CENTER ONLY)
ALT: 14 U/L (ref 0–44)
AST: 21 U/L (ref 15–41)
Albumin: 3.8 g/dL (ref 3.5–5.0)
Alkaline Phosphatase: 105 U/L (ref 38–126)
Anion gap: 10 (ref 5–15)
BUN: 24 mg/dL — ABNORMAL HIGH (ref 8–23)
CO2: 25 mmol/L (ref 22–32)
Calcium: 8.7 mg/dL — ABNORMAL LOW (ref 8.9–10.3)
Chloride: 101 mmol/L (ref 98–111)
Creatinine: 1.05 mg/dL (ref 0.61–1.24)
GFR, Estimated: >60 mL/min
Glucose, Bld: 98 mg/dL (ref 70–99)
Potassium: 4.4 mmol/L (ref 3.5–5.1)
Sodium: 136 mmol/L (ref 135–145)
Total Bilirubin: 0.2 mg/dL (ref 0.0–1.2)
Total Protein: 6.5 g/dL (ref 6.5–8.1)

## 2024-04-26 LAB — CBC WITH DIFFERENTIAL (CANCER CENTER ONLY)
Abs Immature Granulocytes: 0.02 K/uL (ref 0.00–0.07)
Basophils Absolute: 0.1 K/uL (ref 0.0–0.1)
Basophils Relative: 1 %
Eosinophils Absolute: 0.2 K/uL (ref 0.0–0.5)
Eosinophils Relative: 4 %
HCT: 30.6 % — ABNORMAL LOW (ref 39.0–52.0)
Hemoglobin: 10.1 g/dL — ABNORMAL LOW (ref 13.0–17.0)
Immature Granulocytes: 0 %
Lymphocytes Relative: 8 %
Lymphs Abs: 0.5 K/uL — ABNORMAL LOW (ref 0.7–4.0)
MCH: 29.9 pg (ref 26.0–34.0)
MCHC: 33 g/dL (ref 30.0–36.0)
MCV: 90.5 fL (ref 80.0–100.0)
Monocytes Absolute: 0.5 K/uL (ref 0.1–1.0)
Monocytes Relative: 9 %
Neutro Abs: 4.1 K/uL (ref 1.7–7.7)
Neutrophils Relative %: 78 %
Platelet Count: 188 K/uL (ref 150–400)
RBC: 3.38 MIL/uL — ABNORMAL LOW (ref 4.22–5.81)
RDW: 13.2 % (ref 11.5–15.5)
WBC Count: 5.4 K/uL (ref 4.0–10.5)
nRBC: 0 % (ref 0.0–0.2)

## 2024-04-26 MED ORDER — SODIUM CHLORIDE 0.9 % IV SOLN
480.0000 mg | Freq: Once | INTRAVENOUS | Status: AC
  Administered 2024-04-26: 480 mg via INTRAVENOUS
  Filled 2024-04-26: qty 48

## 2024-04-26 MED ORDER — SODIUM CHLORIDE 0.9 % IV SOLN: INTRAVENOUS | Status: DC

## 2024-04-26 MED ORDER — SODIUM CHLORIDE 0.9% FLUSH: 10.0000 mL | INTRAVENOUS | Status: DC | PRN

## 2024-04-26 NOTE — Progress Notes (Signed)
 Nutrition Follow-up:  74 year old male diagnosed with metastatic clear-cell renal carcinoma. Patient is followed by Dr. Sherrod. He has been receiving Yervoy  and nivolumab  - stopped due to toxicity. S/p SRS for brain mets under the care of Dr. Dewey. Patient currently receiving maintenance nivolumab  (start 11/18).   Met with patient in infusion. He is very happy about his weight gain. Reports great appetite. Has a fruit cup and an Ensure Plus in the morning. Ate grilled ham/cheese for lunch, and 2 pieces of chicken, scallop potatoes, beans for dinner. Says he ate every bit of it. Patient reports feeling good overall. Continues daily liquid IV. Enjoyed [employer] at [facility] with wife and son over the weekend.    Medications: reviewed  Labs: BUN 24, Ca 8.7  Anthropometrics: Wt 120 lb 12.8 oz today - increased    NUTRITION DIAGNOSIS: Unintended wt loss - improved   MALNUTRITION DIAGNOSIS: Severe malnutrition continues, however improving    INTERVENTION:  Encourage high calorie high protein foods to support wt gain Continue Ensure plus/equivalent     MONITORING, EVALUATION, GOAL: wt trends, intake    NEXT VISIT: Tuesday March 10 during infusion

## 2024-04-26 NOTE — Patient Instructions (Signed)
 [facility] - A DEPT OF [facility]  Discharge Instructions: Thank you for choosing [facility] to provide your oncology and hematology care.   If you have a lab appointment with the Cancer Center, please go directly to the Cancer Center and check in at the registration area.   Wear comfortable clothing and clothing appropriate for easy access to any Portacath or PICC line.   We strive to give you quality time with your provider. You may need to reschedule your appointment if you arrive late (15 or more minutes).  Arriving late affects you and other patients whose appointments are after yours.  Also, if you miss three or more appointments without notifying the office, you may be dismissed from the clinic at the provider's discretion.      For prescription refill requests, have your pharmacy contact our office and allow 72 hours for refills to be completed.    Today you received the following chemotherapy and/or immunotherapy agents: Nivolumab  (opdivo )   To help prevent nausea and vomiting after your treatment, we encourage you to take your nausea medication as directed.  BELOW ARE SYMPTOMS THAT SHOULD BE REPORTED IMMEDIATELY: *FEVER GREATER THAN 100.4 F (38 C) OR HIGHER *CHILLS OR SWEATING *NAUSEA AND VOMITING THAT IS NOT CONTROLLED WITH YOUR NAUSEA MEDICATION *UNUSUAL SHORTNESS OF BREATH *UNUSUAL BRUISING OR BLEEDING *URINARY PROBLEMS (pain or burning when urinating, or frequent urination) *BOWEL PROBLEMS (unusual diarrhea, constipation, pain near the anus) TENDERNESS IN MOUTH AND THROAT WITH OR WITHOUT PRESENCE OF ULCERS (sore throat, sores in mouth, or a toothache) UNUSUAL RASH, SWELLING OR PAIN  UNUSUAL VAGINAL DISCHARGE OR ITCHING   Items with * indicate a potential emergency and should be followed up as soon as possible or go to the Emergency Department if any problems should occur.  Please show the CHEMOTHERAPY ALERT CARD or  IMMUNOTHERAPY ALERT CARD at check-in to the Emergency Department and triage nurse.  Should you have questions after your visit or need to cancel or reschedule your appointment, please contact [facility] - A DEPT OF [facility]  Dept: [phone]  and follow the prompts.  Office hours are 8:00 a.m. to 4:30 p.m. Monday - Friday. Please note that voicemails left after 4:00 p.m. may not be returned until the following business day.  We are closed weekends and major holidays. You have access to a nurse at all times for urgent questions. Please call the main number to the clinic Dept: [phone] and follow the prompts.   For any non-urgent questions, you may also contact your provider using MyChart. We now offer e-Visits for anyone 76 and older to request care online for non-urgent symptoms. For details visit [email or web address].   Also download the MyChart app! Go to the app store, search MyChart, open the app, select [facility], and log in with your MyChart username and password.

## 2024-05-11 ENCOUNTER — Ambulatory Visit (HOSPITAL_COMMUNITY)
Admission: RE | Admit: 2024-05-11 | Discharge: 2024-05-11 | Disposition: A | Discharge status: Home / Self Care | Source: Ambulatory Visit | Attending: Radiation Oncology | Admitting: Radiation Oncology

## 2024-05-11 ENCOUNTER — Ambulatory Visit (HOSPITAL_COMMUNITY)
Admission: RE | Admit: 2024-05-11 | Discharge: 2024-05-11 | Disposition: A | Source: Ambulatory Visit | Attending: Radiation Oncology | Admitting: Radiation Oncology

## 2024-05-11 DIAGNOSIS — C7949 Secondary malignant neoplasm of other parts of nervous system: Secondary | ICD-10-CM | POA: Insufficient documentation

## 2024-05-11 DIAGNOSIS — C7931 Secondary malignant neoplasm of brain: Secondary | ICD-10-CM | POA: Insufficient documentation

## 2024-05-11 MED ORDER — HEPARIN SOD (PORK) LOCK FLUSH 100 UNIT/ML IV SOLN
500.0000 [IU] | INTRAVENOUS | Status: AC | PRN
  Administered 2024-05-11: 500 [IU]
  Filled 2024-05-11: qty 5

## 2024-05-11 MED ORDER — GADOBUTROL 1 MMOL/ML IV SOLN
5.5000 mL | Freq: Once | INTRAVENOUS | Status: AC | PRN
  Administered 2024-05-11: 5.5 mL via INTRAVENOUS

## 2024-05-11 NOTE — Progress Notes (Incomplete)
 Follow up call to discuss results from lumbar spine MRI and brain MRI from 05/11/24:  Brain MRI:  IMPRESSION: 1. Mild interval progression of intracranial metastatic disease, with enlarging lesions in the right frontal lobe (4 mm to 6 mm), right temporal lobe (6 mm to 11 mm), and left occipital lobe, and new lesions in the left parietal lobe, right lateral pons, and medial left cerebellar hemisphere; other previously seen lesions are not significantly changed.  L-Spine MRI:  IMPRESSION: 1. New since previous MRI 6 mm nonenhancing T1/T2 hypointense lesion in the left posterolateral L4 vertebral body corresponding to a sclerotic focus on prior CT; no pedicular involvement. Differential considerations include enostosis (bone island) versus osteoblastic metastasis. Recommend correlation with repeat FDG PET-CT or short-interval follow-up MRI in 3-6 months to document stability. 2. Mild diffuse disc bulging at L3-4 and L4-5 with mild central spinal canal stenosis and bilateral lateral recess stenosis. 3. Disc space narrowing and bilateral facet hypertrophy at L5-6 causing mild central spinal canal stenosis and mild-to-moderate bilateral lateral recess stenosis. No apparent nerve root impingement. 4. Six lumbar-type vertebrae with the lowest well-formed disc space designated L6-S1, as before.

## 2024-05-16 ENCOUNTER — Ambulatory Visit: Admitting: Radiation Oncology

## 2024-05-16 ENCOUNTER — Inpatient Hospital Stay

## 2024-05-16 ENCOUNTER — Encounter: Payer: Self-pay | Admitting: Radiation Oncology

## 2024-05-17 ENCOUNTER — Telehealth: Payer: Self-pay | Admitting: Radiation Oncology

## 2024-05-17 ENCOUNTER — Ambulatory Visit (INDEPENDENT_AMBULATORY_CARE_PROVIDER_SITE_OTHER): Payer: Medicare Other

## 2024-05-17 ENCOUNTER — Other Ambulatory Visit: Payer: Self-pay | Admitting: Radiation Therapy

## 2024-05-17 VITALS — BP 122/62 | HR 75 | Temp 98.4°F | Ht 73.0 in | Wt 124.4 lb

## 2024-05-17 DIAGNOSIS — Z0000 Encounter for general adult medical examination without abnormal findings: Secondary | ICD-10-CM | POA: Diagnosis not present

## 2024-05-17 DIAGNOSIS — C7931 Secondary malignant neoplasm of brain: Secondary | ICD-10-CM

## 2024-05-17 NOTE — Patient Instructions (Addendum)
 Ian Hart,  Thank you for taking the time for your Medicare Wellness Visit. I appreciate your continued commitment to your health goals. Please review the care plan we discussed, and feel free to reach out if I can assist you further.  Please note that Annual Wellness Visits do not include a physical exam. Some assessments may be limited, especially if the visit was conducted virtually. If needed, we may recommend an in-person follow-up with your provider.  Ongoing Care Seeing your primary care provider every 3 to 6 months helps us  monitor your health and provide consistent, personalized care.   Referrals If a referral was made during today's visit and you haven't received any updates within two weeks, please contact the referred provider directly to check on the status.  Recommended Screenings:  Health Maintenance  Topic Date Due   Zoster (Shingles) Vaccine (1 of 2) 06/06/1969   DTaP/Tdap/Td vaccine (2 - Td or Tdap) 11/09/2021   COVID-19 Vaccine (6 - 2025-26 season) 12/14/2023   Medicare Annual Wellness Visit  05/17/2025   Cologuard (Stool DNA test)  06/05/2025   Pneumococcal Vaccine for age over 43  Completed   Flu Shot  Completed   Meningitis B Vaccine  Aged Out   Colon Cancer Screening  Discontinued   Hepatitis C Screening  Discontinued       05/17/2024   11:06 AM  Advanced Directives  Does Patient Have a Medical Advance Directive? Yes  Type of Estate Agent of [place];Living will  Does patient want to make changes to medical advance directive? No - Patient declined  Copy of Healthcare Power of Attorney in Chart? Yes - validated most recent copy scanned in chart (See row information)    Vision: Annual vision screenings are recommended for early detection of glaucoma, cataracts, and diabetic retinopathy. These exams can also reveal signs of chronic conditions such as diabetes and high blood pressure.  Dental: Annual dental screenings help detect early  signs of oral cancer, gum disease, and other conditions linked to overall health, including heart disease and diabetes.  Please see the attached documents for additional preventive care recommendations.

## 2024-05-17 NOTE — Telephone Encounter (Signed)
 I spoke with the patient and let him know that Dr. Dewey reviewed his recent MRI scans. 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 He is in agreement. Regarding his brain, there are two punctate lesions in question in the pons, a punctate lesion in the left parietal lobe, and possibly a separate area that is indistinct as it in the region of prior treatment from [facility]. The quality of the scan is also quite limited by motion artifact, and Dr. Smitty recommendation is to repeat the MRI in a few weeks, to increase his Ativan  to 1 mg po and to present both scans at multidisciplinary conference. He is in agreement with this plan and I communicated this with our special procedures navigator Devere Perch, RT.

## 2024-05-18 ENCOUNTER — Other Ambulatory Visit: Payer: Self-pay

## 2024-05-19 ENCOUNTER — Other Ambulatory Visit: Payer: Self-pay | Admitting: Family

## 2024-05-23 ENCOUNTER — Inpatient Hospital Stay: Attending: Radiation Oncology

## 2024-05-24 ENCOUNTER — Inpatient Hospital Stay

## 2024-05-24 ENCOUNTER — Inpatient Hospital Stay: Admitting: Internal Medicine

## 2024-06-07 ENCOUNTER — Ambulatory Visit: Admitting: Family

## 2024-06-08 ENCOUNTER — Ambulatory Visit (HOSPITAL_COMMUNITY)

## 2024-06-13 ENCOUNTER — Inpatient Hospital Stay: Attending: Radiation Oncology

## 2024-06-14 ENCOUNTER — Ambulatory Visit: Admitting: Radiation Oncology

## 2024-06-21 ENCOUNTER — Inpatient Hospital Stay

## 2024-06-21 ENCOUNTER — Inpatient Hospital Stay: Admitting: Internal Medicine

## 2024-06-21 ENCOUNTER — Inpatient Hospital Stay: Admitting: Dietician

## 2025-05-23 ENCOUNTER — Ambulatory Visit
